# Patient Record
Sex: Female | Born: 1965 | Race: Black or African American | Hispanic: No | Marital: Single | Smoking: Current some day smoker
Health system: Southern US, Community
[De-identification: ages and names within clinical notes are randomized; demographics above are authoritative.]

## PROBLEM LIST (undated history)

## (undated) DIAGNOSIS — I2510 Atherosclerotic heart disease of native coronary artery without angina pectoris: Secondary | ICD-10-CM

## (undated) DIAGNOSIS — E785 Hyperlipidemia, unspecified: Secondary | ICD-10-CM

## (undated) DIAGNOSIS — I10 Essential (primary) hypertension: Secondary | ICD-10-CM

## (undated) DIAGNOSIS — Z91148 Patient's other noncompliance with medication regimen for other reason: Secondary | ICD-10-CM

## (undated) DIAGNOSIS — Z9114 Patient's other noncompliance with medication regimen: Secondary | ICD-10-CM

## (undated) DIAGNOSIS — F1410 Cocaine abuse, uncomplicated: Secondary | ICD-10-CM

## (undated) DIAGNOSIS — I5022 Chronic systolic (congestive) heart failure: Secondary | ICD-10-CM

## (undated) DIAGNOSIS — I639 Cerebral infarction, unspecified: Secondary | ICD-10-CM

## (undated) HISTORY — DX: Cerebral infarction, unspecified: I63.9

---

## 2006-04-25 ENCOUNTER — Emergency Department (HOSPITAL_COMMUNITY): Admission: EM | Admit: 2006-04-25 | Discharge: 2006-04-25 | Payer: Self-pay | Admitting: Emergency Medicine

## 2006-05-15 ENCOUNTER — Emergency Department (HOSPITAL_COMMUNITY): Admission: EM | Admit: 2006-05-15 | Discharge: 2006-05-15 | Payer: Self-pay | Admitting: Family Medicine

## 2006-11-21 ENCOUNTER — Emergency Department (HOSPITAL_COMMUNITY): Admission: EM | Admit: 2006-11-21 | Discharge: 2006-11-21 | Payer: Self-pay | Admitting: Emergency Medicine

## 2007-03-09 ENCOUNTER — Ambulatory Visit: Payer: Self-pay | Admitting: Family Medicine

## 2007-04-13 ENCOUNTER — Ambulatory Visit: Payer: Self-pay | Admitting: Internal Medicine

## 2014-02-16 ENCOUNTER — Inpatient Hospital Stay (HOSPITAL_COMMUNITY)
Admission: EM | Admit: 2014-02-16 | Discharge: 2014-02-18 | DRG: 989 | Disposition: A | Discharge status: Home / Self Care | Payer: Self-pay | Attending: Internal Medicine | Admitting: Internal Medicine

## 2014-02-16 ENCOUNTER — Emergency Department (HOSPITAL_COMMUNITY): Payer: Self-pay

## 2014-02-16 ENCOUNTER — Encounter (HOSPITAL_COMMUNITY): Payer: Self-pay | Admitting: Emergency Medicine

## 2014-02-16 DIAGNOSIS — Z720 Tobacco use: Secondary | ICD-10-CM

## 2014-02-16 DIAGNOSIS — G894 Chronic pain syndrome: Secondary | ICD-10-CM | POA: Diagnosis present

## 2014-02-16 DIAGNOSIS — I10 Essential (primary) hypertension: Secondary | ICD-10-CM | POA: Diagnosis present

## 2014-02-16 DIAGNOSIS — L03115 Cellulitis of right lower limb: Principal | ICD-10-CM | POA: Diagnosis present

## 2014-02-16 DIAGNOSIS — M7751 Other enthesopathy of right foot: Secondary | ICD-10-CM | POA: Diagnosis present

## 2014-02-16 DIAGNOSIS — L089 Local infection of the skin and subcutaneous tissue, unspecified: Secondary | ICD-10-CM

## 2014-02-16 DIAGNOSIS — L97519 Non-pressure chronic ulcer of other part of right foot with unspecified severity: Secondary | ICD-10-CM | POA: Diagnosis present

## 2014-02-16 DIAGNOSIS — B9561 Methicillin susceptible Staphylococcus aureus infection as the cause of diseases classified elsewhere: Secondary | ICD-10-CM | POA: Diagnosis present

## 2014-02-16 HISTORY — DX: Essential (primary) hypertension: I10

## 2014-02-16 LAB — BASIC METABOLIC PANEL
ANION GAP: 7 (ref 5–15)
BUN: 16 mg/dL (ref 6–23)
CALCIUM: 8.9 mg/dL (ref 8.4–10.5)
CO2: 26 mmol/L (ref 19–32)
CREATININE: 0.8 mg/dL (ref 0.50–1.10)
Chloride: 104 mmol/L (ref 96–112)
GFR calc non Af Amer: 85 mL/min — ABNORMAL LOW (ref >90)
GLUCOSE: 105 mg/dL — AB (ref 70–99)
POTASSIUM: 4.1 mmol/L (ref 3.5–5.1)
SODIUM: 137 mmol/L (ref 135–145)

## 2014-02-16 LAB — CBC WITH DIFFERENTIAL/PLATELET
Basophils Absolute: 0 10*3/uL (ref 0.0–0.1)
Basophils Relative: 0 % (ref 0–1)
Eosinophils Absolute: 0.3 10*3/uL (ref 0.0–0.7)
Eosinophils Relative: 4 % (ref 0–5)
HCT: 38.3 % (ref 36.0–46.0)
HEMOGLOBIN: 14 g/dL (ref 12.0–15.0)
Lymphocytes Relative: 53 % — ABNORMAL HIGH (ref 12–46)
Lymphs Abs: 3.5 10*3/uL (ref 0.7–4.0)
MCH: 30.4 pg (ref 26.0–34.0)
MCHC: 36.6 g/dL — AB (ref 30.0–36.0)
MCV: 83.3 fL (ref 78.0–100.0)
MONO ABS: 0.5 10*3/uL (ref 0.1–1.0)
Monocytes Relative: 8 % (ref 3–12)
NEUTROS PCT: 36 % — AB (ref 43–77)
Neutro Abs: 2.4 10*3/uL (ref 1.7–7.7)
Platelets: 208 10*3/uL (ref 150–400)
RBC: 4.6 MIL/uL (ref 3.87–5.11)
RDW: 13.2 % (ref 11.5–15.5)
WBC: 6.7 10*3/uL (ref 4.0–10.5)

## 2014-02-16 LAB — I-STAT CG4 LACTIC ACID, ED: Lactic Acid, Venous: 1 mmol/L (ref 0.5–2.0)

## 2014-02-16 LAB — CBG MONITORING, ED: GLUCOSE-CAPILLARY: 140 mg/dL — AB (ref 70–99)

## 2014-02-16 LAB — SEDIMENTATION RATE: Sed Rate: 24 mm/hr — ABNORMAL HIGH (ref 0–22)

## 2014-02-16 MED ORDER — CLINDAMYCIN PHOSPHATE 600 MG/50ML IV SOLN: 600.0000 mg | Freq: Once | INTRAVENOUS | Status: DC

## 2014-02-16 MED ORDER — OXYCODONE-ACETAMINOPHEN 5-325 MG PO TABS
1.0000 | ORAL_TABLET | Freq: Once | ORAL | Status: AC
  Administered 2014-02-16: 1 via ORAL
  Filled 2014-02-16: qty 1

## 2014-02-16 NOTE — ED Notes (Signed)
CBG was 140.

## 2014-02-16 NOTE — ED Notes (Addendum)
C/o "sore" on R lateral foot x 2-3 months.  Area tender to touch.  Pt states she has been soaking foot without any improvements.

## 2014-02-16 NOTE — ED Provider Notes (Signed)
CSN: [id number]     Arrival date & time 02/16/14  2100 History  This chart was scribed for non-physician practitioner, Jeannett Senior, PA-C working with Dot Lanes, MD by Frederich Balding, ED scribe. This patient was seen in room TR09C/TR09C and the patient's care was started at 9:36 PM.    Chief Complaint  Patient presents with  . Foot Pain   The history is provided by the patient. No language interpreter was used.    HPI Comments: Leslie Walker is a 49 y.o. female who presents to the Emergency Department complaining of a wound to her right foot. States it started as a callus about one year ago but her husband tried to use a razor to cut it open a few months ago. Reports pus drainage and increased pain that started about 2 weeks ago. Pt has done epsom salt soaks with no relief. Denies history of diabetes.   Past Medical History  Diagnosis Date  . Hypertension    History reviewed. No pertinent past surgical history. No family history on file. History  Substance Use Topics  . Smoking status: Current Every Day Smoker  . Smokeless tobacco: Not on file  . Alcohol Use: No   OB History    No data available     Review of Systems  Musculoskeletal: Positive for arthralgias.  All other systems reviewed and are negative.  Allergies  Review of patient's allergies indicates not on file.  Home Medications   Prior to Admission medications   Not on File   BP 148/76 mmHg  Pulse 77  Temp(Src) 97.8 F (36.6 C) (Oral)  Resp 20  Ht 5\' 4"  (1.626 m)  Wt 175 lb (79.379 kg)  BMI 30.02 kg/m2  SpO2 98%  LMP  (LMP Unknown)   Physical Exam  Constitutional: She is oriented to person, place, and time. She appears well-developed and well-nourished. No distress.  HENT:  Head: Normocephalic and atraumatic.  Eyes: Conjunctivae and EOM are normal.  Neck: Neck supple. No tracheal deviation present.  Cardiovascular: Normal rate.   Pulmonary/Chest: Effort normal. No respiratory distress.   Musculoskeletal: Normal range of motion.  3x4cm ulceration to the right lateral foot over 5th metatarsal. TTP. Large purulent drainage upon palpation expressed from wound. Mild tenderness with palpation over dorsal foot. DP pulses intact  Neurological: She is alert and oriented to person, place, and time.  Skin: Skin is warm and dry.  Psychiatric: She has a normal mood and affect. Her behavior is normal.  Nursing note and vitals reviewed.   ED Course  Procedures (including critical care time)  DIAGNOSTIC STUDIES: Oxygen Saturation is 98% on RA, normal by my interpretation.    COORDINATION OF CARE: 9:38 PM-Discussed treatment plan which includes foot xray and blood work with pt at bedside and pt agreed to plan.   Labs Review Labs Reviewed  CBC WITH DIFFERENTIAL/PLATELET - Abnormal; Notable for the following:    MCHC 36.6 (*)    Neutrophils Relative % 36 (*)    Lymphocytes Relative 53 (*)    All other components within normal limits  BASIC METABOLIC PANEL - Abnormal; Notable for the following:    Glucose, Bld 105 (*)    GFR calc non Af Amer 85 (*)    All other components within normal limits  SEDIMENTATION RATE - Abnormal; Notable for the following:    Sed Rate 24 (*)    All other components within normal limits  CBG MONITORING, ED - Abnormal; Notable for the following:  Glucose-Capillary 140 (*)    All other components within normal limits  WOUND CULTURE  I-STAT CG4 LACTIC ACID, ED    Imaging Review Dg Foot Complete Right  02/16/2014   CLINICAL DATA:  Right foot pain, lateral pain for 2-3 months. Tenderness. Initial encounter.  EXAM: RIGHT FOOT COMPLETE - 3+ VIEW  COMPARISON:  None.  FINDINGS: No fracture or dislocation. The alignment and joint spaces are maintained. 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 Mild proliferative spurring in the midfoot. No erosion or periosteal reaction. There is mild soft tissue edema about the lateral  flow with questionable skin defect laterally about the proximal fifth metatarsal. There are no radiopaque foreign bodies.  IMPRESSION: 1. No acute osseous abnormality. 2. Soft tissue edema laterally with questionable skin defect in the region of the proximal fifth metatarsal, may reflect cellulitis/soft tissue infection. No radiographic findings of osteomyelitis.   Electronically Signed   By: Jeb Levering M.D.   On: 02/16/2014 22:28     EKG Interpretation None      MDM   Final diagnoses:  Foot infection     patient with ongoing ulcer to the right foot, has been there for a year. Recurrent abscesses, purulent drainage on and off for the last several months. Presents with increased pain, unable to bear weight on the left, purulent drainage noted from the ulceration to the right lateral foot. We'll get labs, x-ray, cultures obtained and sent. Dr. Audie Pinto has seen patient as well, concerned about recurrence of this infection. Question foreign body.  12:14 AM Spoke with Dr. Doran Durand regarding patient's foot, advised to get MRI of the foot. Also asked not to start antibiotics until deep cultures obtained. Patient's pain treated with Percocet.   Spoke with tried hospitalist, they will admit patient.  Filed Vitals:   02/16/14 2105  BP: 148/76  Pulse: 77  Temp: 97.8 F (36.6 C)  TempSrc: Oral  Resp: 20  Height: 5\' 4"  (1.626 m)  Weight: 175 lb (79.379 kg)  SpO2: 98%     I personally performed the services described in this documentation, which was scribed in my presence. The recorded information has been reviewed and is accurate.  Renold Genta, PA-C 02/17/14 0022  Dot Lanes, MD 02/19/14 [phone]

## 2014-02-17 ENCOUNTER — Inpatient Hospital Stay (HOSPITAL_COMMUNITY): Payer: MEDICAID

## 2014-02-17 DIAGNOSIS — L97509 Non-pressure chronic ulcer of other part of unspecified foot with unspecified severity: Secondary | ICD-10-CM | POA: Insufficient documentation

## 2014-02-17 DIAGNOSIS — G894 Chronic pain syndrome: Secondary | ICD-10-CM | POA: Diagnosis present

## 2014-02-17 DIAGNOSIS — L03115 Cellulitis of right lower limb: Principal | ICD-10-CM

## 2014-02-17 DIAGNOSIS — I10 Essential (primary) hypertension: Secondary | ICD-10-CM | POA: Diagnosis present

## 2014-02-17 LAB — CBC
HCT: 36.7 % (ref 36.0–46.0)
Hemoglobin: 13.3 g/dL (ref 12.0–15.0)
MCH: 30.1 pg (ref 26.0–34.0)
MCHC: 36.2 g/dL — AB (ref 30.0–36.0)
MCV: 83 fL (ref 78.0–100.0)
PLATELETS: 194 10*3/uL (ref 150–400)
RBC: 4.42 MIL/uL (ref 3.87–5.11)
RDW: 13.2 % (ref 11.5–15.5)
WBC: 4.4 10*3/uL (ref 4.0–10.5)

## 2014-02-17 LAB — COMPREHENSIVE METABOLIC PANEL
ALBUMIN: 3.3 g/dL — AB (ref 3.5–5.2)
ALT: 12 U/L (ref 0–35)
ANION GAP: 10 (ref 5–15)
AST: 20 U/L (ref 0–37)
Alkaline Phosphatase: 75 U/L (ref 39–117)
BILIRUBIN TOTAL: 0.4 mg/dL (ref 0.3–1.2)
BUN: 14 mg/dL (ref 6–23)
CALCIUM: 9 mg/dL (ref 8.4–10.5)
CO2: 24 mmol/L (ref 19–32)
CREATININE: 0.75 mg/dL (ref 0.50–1.10)
Chloride: 106 mmol/L (ref 96–112)
GFR calc Af Amer: >90 mL/min (ref >90)
GFR calc non Af Amer: >90 mL/min (ref >90)
Glucose, Bld: 111 mg/dL — ABNORMAL HIGH (ref 70–99)
POTASSIUM: 4.1 mmol/L (ref 3.5–5.1)
SODIUM: 140 mmol/L (ref 135–145)
Total Protein: 6.8 g/dL (ref 6.0–8.3)

## 2014-02-17 LAB — PROTIME-INR
INR: 0.98 (ref 0.00–1.49)
PROTHROMBIN TIME: 13.1 s (ref 11.6–15.2)

## 2014-02-17 LAB — C-REACTIVE PROTEIN

## 2014-02-17 MED ORDER — ONDANSETRON HCL 4 MG PO TABS: 4.0000 mg | ORAL_TABLET | Freq: Four times a day (QID) | ORAL | Status: DC | PRN

## 2014-02-17 MED ORDER — TETANUS-DIPHTH-ACELL PERTUSSIS 5-2.5-18.5 LF-MCG/0.5 IM SUSP
0.5000 mL | Freq: Once | INTRAMUSCULAR | Status: AC
  Administered 2014-02-17: 0.5 mL via INTRAMUSCULAR
  Filled 2014-02-17: qty 0.5

## 2014-02-17 MED ORDER — OXYCODONE-ACETAMINOPHEN 5-325 MG PO TABS
1.0000 | ORAL_TABLET | ORAL | Status: AC | PRN
  Administered 2014-02-17 (×2): 1 via ORAL
  Filled 2014-02-17 (×2): qty 1

## 2014-02-17 MED ORDER — ACETAMINOPHEN 325 MG PO TABS: 650.0000 mg | ORAL_TABLET | Freq: Four times a day (QID) | ORAL | Status: DC | PRN

## 2014-02-17 MED ORDER — LISINOPRIL 10 MG PO TABS
10.0000 mg | ORAL_TABLET | Freq: Every day | ORAL | Status: DC
  Administered 2014-02-18: 10 mg via ORAL
  Filled 2014-02-17 (×3): qty 1

## 2014-02-17 MED ORDER — METHADONE HCL 5 MG PO TABS
36.0000 mg | ORAL_TABLET | Freq: Every day | ORAL | Status: DC
  Administered 2014-02-17 – 2014-02-18 (×2): 35 mg via ORAL
  Filled 2014-02-17 (×2): qty 7

## 2014-02-17 MED ORDER — ONDANSETRON HCL 4 MG/2ML IJ SOLN
4.0000 mg | Freq: Four times a day (QID) | INTRAMUSCULAR | Status: DC | PRN
  Administered 2014-02-17: 4 mg via INTRAVENOUS
  Filled 2014-02-17: qty 2

## 2014-02-17 MED ORDER — ENOXAPARIN SODIUM 40 MG/0.4ML ~~LOC~~ SOLN
40.0000 mg | SUBCUTANEOUS | Status: DC
Start: 2014-02-17 — End: 2014-02-18
  Administered 2014-02-17: 40 mg via SUBCUTANEOUS
  Filled 2014-02-17 (×3): qty 0.4

## 2014-02-17 MED ORDER — ACETAMINOPHEN 650 MG RE SUPP: 650.0000 mg | Freq: Four times a day (QID) | RECTAL | Status: DC | PRN

## 2014-02-17 MED ORDER — GADOBENATE DIMEGLUMINE 529 MG/ML IV SOLN
10.0000 mL | Freq: Once | INTRAVENOUS | Status: AC | PRN
  Administered 2014-02-17: 10 mL via INTRAVENOUS

## 2014-02-17 MED ORDER — METHADONE HCL 5 MG/5ML PO SOLN: 36.0000 mg | Freq: Every day | ORAL | Status: DC

## 2014-02-17 MED ORDER — ONDANSETRON HCL 4 MG/2ML IJ SOLN: 4.0000 mg | Freq: Three times a day (TID) | INTRAMUSCULAR | Status: DC | PRN

## 2014-02-17 NOTE — H&P (Signed)
[facility] History and Physical  Patient: Leslie Walker  MRN: [id number]  DOB: Nov 08, 1965  DOS: the patient was seen and examined on 02/17/2014 PCP: No primary care provider on file.  Chief Complaint: Right foot discharge  HPI: Leslie Walker is a 49 y.o. female with Past medical history of hypertension and chronic pain syndrome. The patient is presenting with complaints of discharge from her right foot. She mentions that she has developed a callus 1 year ago. The callus was progressing in few months ago her husband removed a portion of it by cutting. They have been cutting the lesion for few times over last few months. The patient has also developed pain and has been limping while walking. Leslie Walker has progressively worsened over last 1 month. Today the patient was mentioning that the pain was more severe. Patient soak her leg in Epsom salt. After that there was more swelling on the foot. Later on when the patient was walking there was pus coming out from the lesion and therefore they brought her here. Patient complains of pain at the time of my evaluation at the ankle joint and dorsum of the foot. Patient also complains of pain on movement of the toe and the foot. Patient denies any fever chills nausea vomiting chest pain shortness of breath cough diarrhea burning urination. Patient denies using any antibiotics. Patient denies any injury that may have started this lesion. Patient does not remember her last tetanus injection.   The patient is coming from home. And at her baseline independent for most of her ADL.  Review of Systems: as mentioned in the history of present illness.  A Comprehensive review of the other systems is negative.  Past Medical History  Diagnosis Date  . Hypertension    History reviewed. No pertinent past surgical history. Social History:  reports that she has been smoking.  She does not have any smokeless tobacco history on file. She reports that she does not  drink alcohol or use illicit drugs.  No Known Allergies  No family history on file.  Prior to Admission medications   Medication Sig Start Date End Date Taking? Authorizing Provider  lisinopril (PRINIVIL,ZESTRIL) 10 MG tablet Take 10 mg by mouth daily.   Yes Historical Provider, MD  methadone (DOLOPHINE) 10 MG/ML solution Take 36 mg by mouth daily.   Yes Historical Provider, MD    Physical Exam: Filed Vitals:   02/16/14 2105 02/17/14 0035 02/17/14 0129 02/17/14 0500  BP: 148/76 123/81 130/77 113/60  Pulse: 77 69 65 69  Temp: 97.8 F (36.6 C) 97.7 F (36.5 C) 97.6 F (36.4 C) 98.5 F (36.9 C)  TempSrc: Oral  Oral Oral  Resp: '20 20 20 20  ' Height: '5\' 4"'  (1.626 m)  '5\' 4"'  (1.626 m)   Weight: 79.379 kg (175 lb)  78.019 kg (172 lb)   SpO2: 98% 100% 100% 100%    General: Alert, Awake and Oriented to Time, Place and Person. Appear in mild distress Eyes: PERRL ENT: Oral Mucosa clear moist Neck: no JVD Cardiovascular: S1 and S2 Present, no Murmur, Peripheral Pulses Present Respiratory: Bilateral Air entry equal and Decreased, noClear to Auscultation, noCrackles, no wheezes Abdomen: Bowel Sound present, Soft and no tender Skin: no Rash Extremities: no Pedal edema, no calf tenderness Tenderness in the dorsum of the foot and lateral aspect. General white discoloration on the lateral aspect of the foot. Labs on the foot is warm and tender. Mild edema on the foot. Neurologic: Grossly no focal neuro deficit.  Labs on Admission:  CBC:  Recent Labs Lab 02/16/14 2209  WBC 6.7  NEUTROABS 2.4  HGB 14.0  HCT 38.3  MCV 83.3  PLT 208    CMP     Component Value Date/Time   NA 137 02/16/2014 2209   K 4.1 02/16/2014 2209   CL 104 02/16/2014 2209   CO2 26 02/16/2014 2209   GLUCOSE 105* 02/16/2014 2209   BUN 16 02/16/2014 2209   CREATININE 0.80 02/16/2014 2209   CALCIUM 8.9 02/16/2014 2209   GFRNONAA 85* 02/16/2014 2209   GFRAA >90 02/16/2014 2209    No results for input(s):  LIPASE, AMYLASE in the last 168 hours.  No results for input(s): CKTOTAL, CKMB, CKMBINDEX, TROPONINI in the last 168 hours. BNP (last 3 results) No results for input(s): PROBNP in the last 8760 hours.  Radiological Exams on Admission: Dg Foot Complete Right  02/16/2014   CLINICAL DATA:  Right foot pain, lateral pain for 2-3 months. Tenderness. Initial encounter.  EXAM: RIGHT FOOT COMPLETE - 3+ VIEW  COMPARISON:  None.  FINDINGS: No fracture or dislocation. The alignment and joint spaces are maintained. 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 Mild proliferative spurring in the midfoot. No erosion or periosteal reaction. There is mild soft tissue edema about the lateral flow with questionable skin defect laterally about the proximal fifth metatarsal. There are no radiopaque foreign bodies.  IMPRESSION: 1. No acute osseous abnormality. 2. Soft tissue edema laterally with questionable skin defect in the region of the proximal fifth metatarsal, may reflect cellulitis/soft tissue infection. No radiographic findings of osteomyelitis.   Electronically Signed   By: Jeb Levering M.D.   On: 02/16/2014 22:28    Assessment/Plan Principal Problem:   Cellulitis of foot, right Active Problems:   Chronic pain syndrome   Essential hypertension   1. Cellulitis of foot, right The patient is presenting with complaints of pain and antalgic gait. Also noted some pus and an ulcer on the lateral aspect of the right foot. The ulcer has been present for last 1 month but progressively worsening. At present the foot x-ray shows soft tissue swelling. Case was discussed with orthopedic would recommend an MRI of the foot and will be following up with the patient. MRA of the foot is already ordered. Follow cultures ESR CRP. At present holding antibiotic as per discussion with orthopedics.  2. Chronic pain syndrome. Continuing methadone at home doses. The medication has been  verified by the pharmacy.  3. Essential hypertension. Continue home medication.  Advance goals of care discussion: Full codeConOrthopedics  DVT Prophylaxis: subcutaneous Heparin Nutrition: Regular diet  Family Communication: husband was present at bedside, opportunity was given to ask question and all questions were answered satisfactorily at the time of interview. Disposition: Admitted to inpatient in med-surge unit.  Author: Berle Mull, MD [facility] Pager: [phone] 02/17/2014, 5:18 AM    If 7PM-7AM, please contact night-coverage [email or web address] Password [email or web address]

## 2014-02-17 NOTE — Progress Notes (Signed)
Utilization review completed. Shaday Rayborn, RN, BSN. 

## 2014-02-17 NOTE — Consult Note (Addendum)
Reason for Consult:  right foot pain and drainage Referring Physician: Dr. Irine Seal Leslie Walker is an 49 y.o. female.  HPI:  49 y/o female with PMH of htn c/o pain from callous on her right foot for the last year.  She has had it trimmed by her husband and has experienced drainage periodically.  She denies f/c/n/v.  No injury or surgery to the foot.  The pt is a very poor historian.  No h/o diabetes or peripheral neuropathy.  She says it hurts to walk on it and feels better with rest and elevation.  Past Medical History  Diagnosis Date  . Hypertension     History reviewed. No pertinent past surgical history.  No family history on file.  Social History:  reports that she has been smoking.  She does not have any smokeless tobacco history on file. She reports that she does not drink alcohol or use illicit drugs.  Works in custodial services  Allergies: No Known Allergies  Medications: I have reviewed the patient's current medications.  Results for orders placed or performed during the hospital encounter of 02/16/14 (from the past 48 hour(s))  POC CBG, ED     Status: Abnormal   Collection Time: 02/16/14  9:31 PM  Result Value Ref Range   Glucose-Capillary 140 (H) 70 - 99 mg/dL  CBC with Differential     Status: Abnormal   Collection Time: 02/16/14 10:09 PM  Result Value Ref Range   WBC 6.7 4.0 - 10.5 K/uL   RBC 4.60 3.87 - 5.11 MIL/uL   Hemoglobin 14.0 12.0 - 15.0 g/dL   HCT 38.3 36.0 - 46.0 %   MCV 83.3 78.0 - 100.0 fL   MCH 30.4 26.0 - 34.0 pg   MCHC 36.6 (H) 30.0 - 36.0 g/dL   RDW 13.2 11.5 - 15.5 %   Platelets 208 150 - 400 K/uL   Neutrophils Relative % 36 (L) 43 - 77 %   Neutro Abs 2.4 1.7 - 7.7 K/uL   Lymphocytes Relative 53 (H) 12 - 46 %   Lymphs Abs 3.5 0.7 - 4.0 K/uL   Monocytes Relative 8 3 - 12 %   Monocytes Absolute 0.5 0.1 - 1.0 K/uL   Eosinophils Relative 4 0 - 5 %   Eosinophils Absolute 0.3 0.0 - 0.7 K/uL   Basophils Relative 0 0 - 1 %   Basophils Absolute  0.0 0.0 - 0.1 K/uL  Basic metabolic panel     Status: Abnormal   Collection Time: 02/16/14 10:09 PM  Result Value Ref Range   Sodium 137 135 - 145 mmol/L   Potassium 4.1 3.5 - 5.1 mmol/L   Chloride 104 96 - 112 mmol/L   CO2 26 19 - 32 mmol/L   Glucose, Bld 105 (H) 70 - 99 mg/dL   BUN 16 6 - 23 mg/dL   Creatinine, Ser 0.80 0.50 - 1.10 mg/dL   Calcium 8.9 8.4 - 10.5 mg/dL   GFR calc non Af Amer 85 (L) >90 mL/min   GFR calc Af Amer >90 >90 mL/min    Comment: (NOTE) The eGFR has been calculated using the CKD EPI equation. This calculation has not been validated in all clinical situations. eGFR's persistently <90 mL/min signify possible Chronic Kidney Disease.    Anion gap 7 5 - 15  Sedimentation rate     Status: Abnormal   Collection Time: 02/16/14 10:09 PM  Result Value Ref Range   Sed Rate 24 (H) 0 - 22  mm/hr  I-Stat CG4 Lactic Acid, ED     Status: None   Collection Time: 02/16/14 10:16 PM  Result Value Ref Range   Lactic Acid, Venous 1.00 0.5 - 2.0 mmol/L    Dg Foot Complete Right  02/16/2014   CLINICAL DATA:  Right foot pain, lateral pain for 2-3 months. Tenderness. Initial encounter.  EXAM: RIGHT FOOT COMPLETE - 3+ VIEW  COMPARISON:  None.  FINDINGS: No fracture or dislocation. The alignment and joint spaces are maintained. 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 Mild proliferative spurring in the midfoot. No erosion or periosteal reaction. There is mild soft tissue edema about the lateral flow with questionable skin defect laterally about the proximal fifth metatarsal. There are no radiopaque foreign bodies.  IMPRESSION: 1. No acute osseous abnormality. 2. Soft tissue edema laterally with questionable skin defect in the region of the proximal fifth metatarsal, may reflect cellulitis/soft tissue infection. No radiographic findings of osteomyelitis.   Electronically Signed   By: Jeb Levering M.D.   On: 02/16/2014 22:28    ROS:  No  n./v/f/c.  + sweats (she relates to menopause) PE:  Blood pressure 113/60, pulse 69, temperature 98.5 F (36.9 C), temperature source Oral, resp. rate 20, height _0  (1.626 m), weight 78.019 kg (172 lb), SpO2 100 %. wn wd woman in nad.  A and O x 4.  Mood and affect normal.  EOMi.  Res punalbored.  R foot with 2 mm ulcer laterally at 5th MT base.  No lymphadenopathy.  Skin o/w heatlhy adn intact.  5/5 strength in PF and DF of the ankle and toes.  Snes to LT intact at the forefoot.  TTP at 5th MT base.  No fluctuance.  serosang drainage.  No purulence.  No cellulitis.  Assessment/Plan: R foot ulcer and abscess - rec MRI of right foot to eval for deep abscess.  Based on results either OR or bedside I and D with packing.  Pt will likely need short course of abx post op.  Hold abx for now to allow for deep cultures in OR.    Leslie Walker 02/17/2014, 7:48 AM

## 2014-02-17 NOTE — Progress Notes (Signed)
Dr. Patel at bedside 

## 2014-02-17 NOTE — Progress Notes (Signed)
She'll admitted few hours ago for right foot cellulitis/abscess, orthopedics following. Await MRI. Stable vital signs patient stable. Continue present care.

## 2014-02-18 ENCOUNTER — Encounter (HOSPITAL_COMMUNITY)
Admission: EM | Disposition: A | Discharge status: Home / Self Care | Payer: Self-pay | Source: Home / Self Care | Attending: Internal Medicine

## 2014-02-18 SURGERY — IRRIGATION AND DEBRIDEMENT EXTREMITY
Anesthesia: General | Laterality: Right

## 2014-02-18 MED ORDER — HYDROCODONE-ACETAMINOPHEN 5-325 MG PO TABS: 1.0000 | ORAL_TABLET | Freq: Four times a day (QID) | ORAL | Status: DC | PRN

## 2014-02-18 MED ORDER — SULFAMETHOXAZOLE-TRIMETHOPRIM 400-80 MG PO TABS
1.0000 | ORAL_TABLET | Freq: Two times a day (BID) | ORAL | Status: DC
Start: 2014-02-18 — End: 2018-06-26

## 2014-02-18 NOTE — Progress Notes (Signed)
Subjective: Pt says right foot is still sore.  Denies f/c/n/v.  Enjoying a biscuit upon my arrival. No abx on this admission so far.  Objective: Vital signs in last 24 hours: Temp:  [98.2 F (36.8 C)-98.7 F (37.1 C)] 98.6 F (37 C) (02/02 0618) Pulse Rate:  [67-70] 68 (02/02 0618) Resp:  [20] 20 (02/01 1254) BP: (92-140)/(45-67) 140/67 mmHg (02/02 0618) SpO2:  [98 %-100 %] 100 % (02/02 0618) Weight:  [73.301 kg (161 lb 9.6 oz)] 73.301 kg (161 lb 9.6 oz) (02/02 0540)  Intake/Output from previous day: 02/01 0701 - 02/02 0700 In: 240 [P.O.:240] Out: -  Intake/Output this shift:     Recent Labs  02/16/14 2209 02/17/14 0930  HGB 14.0 13.3    Recent Labs  02/16/14 2209 02/17/14 0930  WBC 6.7 4.4  RBC 4.60 4.42  HCT 38.3 36.7  PLT 208 194    Recent Labs  02/16/14 2209 02/17/14 0930  NA 137 140  K 4.1 4.1  CL 104 106  CO2 26 24  BUN 16 14  CREATININE 0.80 0.75  GLUCOSE 105* 111*  CALCIUM 8.9 9.0    Recent Labs  02/17/14 0930  INR 0.98    PE:  wn wd woman in nad.  R foot with calloused area about the 5th MT.  No cellulitis.  Sens to LT intact.  TTp at the calloused area.  MRI done late yesterday showed no abscess.  Only bursa at the 5th MT base.  Assessment/Plan: R foot 5th MT base ulcer and bursitis - today I explained the nature of this bursitis and ulcer to the patient and her husband.  I offered to pare down the callous to help relieve the pressure on this area and taught her husband how to make a pressure relieving orthotic.  Procedure:  After informed consent, I pared down the calloused area with a #10 scalpel.  This uncovered two small areas of ulceration with healthy granulation tissue.  No purulence noted.  No fluctuance or drainage.  She tolerated this procedure well with no evident complications.  I believe she can be discharged home safely.  I'll see her back in the office in a week for a wound check.  D/w with Dr. Candiss Norse.   Wylene Simmer 02/18/2014, 8:44 AM

## 2014-02-18 NOTE — Discharge Instructions (Addendum)
Keep your R.Foot clean and dry at all times  Follow with Primary MD and Dr Doran Durand in 7 days   Get CBC, CMP, 2 view Chest X ray checked  by Primary MD next visit.    Activity: As tolerated with Full fall precautions use walker/cane & assistance as needed   Disposition Home    Diet: Heart Healthy   For Heart failure patients - Check your Weight same time everyday, if you gain over 2 pounds, or you develop in leg swelling, experience more shortness of breath or chest pain, call your Primary MD immediately. Follow Cardiac Low Salt Diet and 1.8 lit/day fluid restriction.   On your next visit with your primary care physician please Get Medicines reviewed and adjusted.   Please request your Prim.MD to go over all Hospital Tests and Procedure/Radiological results at the follow up, please get all Hospital records sent to your Prim MD by signing hospital release before you go home.   If you experience worsening of your admission symptoms, develop shortness of breath, life threatening emergency, suicidal or homicidal thoughts you must seek medical attention immediately by calling 911 or calling your MD immediately  if symptoms less severe.  You Must read complete instructions/literature along with all the possible adverse reactions/side effects for all the Medicines you take and that have been prescribed to you. Take any new Medicines after you have completely understood and accpet all the possible adverse reactions/side effects.   Do not drive, operating heavy machinery, perform activities at heights, swimming or participation in water activities or provide baby sitting services if your were admitted for syncope or siezures until you have seen by Primary MD or a Neurologist and advised to do so again.  Do not drive when taking Pain medications.    Do not take more than prescribed Pain, Sleep and Anxiety Medications  Special Instructions: If you have smoked or chewed Tobacco  in the last 2 yrs  please stop smoking, stop any regular Alcohol  and or any Recreational drug use.  Wear Seat belts while driving.   Please note  You were cared for by a hospitalist during your hospital stay. If you have any questions about your discharge medications or the care you received while you were in the hospital after you are discharged, you can call the unit and asked to speak with the hospitalist on call if the hospitalist that took care of you is not available. Once you are discharged, your primary care physician will handle any further medical issues. Please note that NO REFILLS for any discharge medications will be authorized once you are discharged, as it is imperative that you return to your primary care physician (or establish a relationship with a primary care physician if you do not have one) for your aftercare needs so that they can reassess your need for medications and monitor your lab values.                                                       Ellieana Dolecki was admitted to the Hospital on 02/16/2014 and Discharged  02/18/2014 and should be excused from work/school   for 5  days starting 02/16/2014 , may return to work/school without any restrictions.  Call Lala Lund MD, [facility] [phone] with questions.  Thurnell Lose M.D on 02/18/2014,at  8:00 AM  [facility]   Office  [place], [place]  Please read the following information regarding your care after surgery.  Medications   Take the antibiotic as prescribed for a week.   Soak your foot daily in Epsom salt and warm water for 15 min.  Weight Bearing Bear weight only on the heel of your operated foot in the post-op shoe.  Cast / Splint / Dressing Change your dressing daily with gauze and the ace bandage.  Swelling It is normal for you to have swelling where you had surgery.  To reduce swelling and pain, keep your toes above your nose for at least 3 days after  surgery.  It may be necessary to keep your foot or leg elevated for several weeks.  If it hurts, it should be elevated.  Follow Up Call my office at [phone] when you are discharged from the hospital or surgery center to schedule an appointment to be seen two weeks after surgery.  Call my office at [phone] if you develop a fever >101.5 F, nausea, vomiting, bleeding from the surgical site or severe pain.

## 2014-02-18 NOTE — Progress Notes (Signed)
Orthopedic Tech Progress Note Patient Details:  Leslie Walker 05-05-1965 [id number]  Ortho Devices Type of Ortho Device: Postop shoe/boot Ortho Device/Splint Location: rle Ortho Device/Splint Interventions: Application As ordered by Dr. Joesph Fillers, Leslie Walker 02/18/2014, 9:00 AM

## 2014-02-18 NOTE — Discharge Summary (Signed)
Leslie Walker, is a 49 y.o. female  DOB Nov 02, 1965  MRN [id number].  Admission date:  02/16/2014  Admitting Physician  Berle Mull, MD  Discharge Date:  02/18/2014   Primary MD  No primary care provider on file.  Recommendations for primary care physician for things to follow:   Check CBC, BMP in a week. Monitor right Calus wound infection.   Admission Diagnosis  Foot infection [L08.9]   Discharge Diagnosis  Foot infection [L08.9]    Principal Problem:   Cellulitis of foot, right Active Problems:   Chronic pain syndrome   Essential hypertension      Past Medical History  Diagnosis Date  . Hypertension     History reviewed. No pertinent past surgical history.     History of present illness and  Hospital Course:     Kindly see H&P for history of present illness and admission details, please review complete Labs, Consult reports and Test reports for all details in brief  HPI  from the history and physical done on the day of admission  Leslie Walker is a 49 y.o. female with Past medical history of hypertension and chronic pain syndrome. The patient is presenting with complaints of discharge from her right foot. She mentions that she has developed a callus 1 year ago. The callus was progressing in few months ago her husband removed a portion of it by cutting. They have been cutting the lesion for few times over last few months. The patient has also developed pain and has been limping while walking. Leslie Walker has progressively worsened over last 1 month. Today the patient was mentioning that the pain was more severe. Patient soak her leg in Epsom salt. After that there was more swelling on the foot. Later on when the patient was walking there was pus coming out from the lesion and therefore they brought her here. Patient  complains of pain at the time of my evaluation at the ankle joint and dorsum of the foot. Patient also complains of pain on movement of the toe and the foot. Patient denies any fever chills nausea vomiting chest pain shortness of breath cough diarrhea burning urination. Patient denies using any antibiotics. Patient denies any injury that may have started this lesion. Patient does not remember her last tetanus injection.  Hospital Course    1. Right foot lateral aspect callus with mild cellulitis. MRI stable without any abscess or osteomyelitis, seen by orthopedic physician Dr. Doran Durand, underwent bedside debridement. Per Dr. Doran Durand stable for home discharge on 1 week of oral Bactrim with outpatient follow-up with him in the office.   2. Essential hypertension and chronic pain. Home medications continued.     Discharge Condition: Stable   Follow UP  Follow-up Information    Follow up with [facility]    . Schedule an appointment as soon as possible for a visit in 1 week.   Contact information:   [address] [place] [address] [phone]  Follow up with HEWITT, Jenny Reichmann, MD. Schedule an appointment as soon as possible for a visit in 1 week.   Specialty:  Orthopedic Surgery   Contact information:   [address] [place] [address] [phone]         Discharge Instructions  and  Discharge Medications      Discharge Instructions    Diet - low sodium heart healthy    Complete by:  As directed      Discharge instructions    Complete by:  As directed   Keep your R.Foot clean and dry at all times  Follow with Primary MD and Dr Doran Durand in 7 days   Get CBC, CMP, 2 view Chest X ray checked  by Primary MD next visit.    Activity: As tolerated with Full fall precautions use walker/cane & assistance as needed   Disposition Home    Diet: Heart Healthy   For Heart failure patients - Check your  Weight same time everyday, if you gain over 2 pounds, or you develop in leg swelling, experience more shortness of breath or chest pain, call your Primary MD immediately. Follow Cardiac Low Salt Diet and 1.8 lit/day fluid restriction.   On your next visit with your primary care physician please Get Medicines reviewed and adjusted.   Please request your Prim.MD to go over all Hospital Tests and Procedure/Radiological results at the follow up, please get all Hospital records sent to your Prim MD by signing hospital release before you go home.   If you experience worsening of your admission symptoms, develop shortness of breath, life threatening emergency, suicidal or homicidal thoughts you must seek medical attention immediately by calling 911 or calling your MD immediately  if symptoms less severe.  You Must read complete instructions/literature along with all the possible adverse reactions/side effects for all the Medicines you take and that have been prescribed to you. Take any new Medicines after you have completely understood and accpet all the possible adverse reactions/side effects.   Do not drive, operating heavy machinery, perform activities at heights, swimming or participation in water activities or provide baby sitting services if your were admitted for syncope or siezures until you have seen by Primary MD or a Neurologist and advised to do so again.  Do not drive when taking Pain medications.    Do not take more than prescribed Pain, Sleep and Anxiety Medications  Special Instructions: If you have smoked or chewed Tobacco  in the last 2 yrs please stop smoking, stop any regular Alcohol  and or any Recreational drug use.  Wear Seat belts while driving.   Please note  You were cared for by a hospitalist during your hospital stay. If you have any questions about your discharge medications or the care you received while you were in the hospital after you are discharged, you can call  the unit and asked to speak with the hospitalist on call if the hospitalist that took care of you is not available. Once you are discharged, your primary care physician will handle any further medical issues. Please note that NO REFILLS for any discharge medications will be authorized once you are discharged, as it is imperative that you return to your primary care physician (or establish a relationship with a primary care physician if you do not have one) for your aftercare needs so that they can reassess your need for medications and monitor your lab values.     Increase activity slowly  Complete by:  As directed             Medication List    TAKE these medications        HYDROcodone-acetaminophen 5-325 MG per tablet  Commonly known as:  NORCO/VICODIN  Take 1 tablet by mouth every 6 (six) hours as needed for moderate pain.     lisinopril 10 MG tablet  Commonly known as:  PRINIVIL,ZESTRIL  Take 10 mg by mouth daily.     methadone 10 MG/ML solution  Commonly known as:  DOLOPHINE  Take 36 mg by mouth daily.     sulfamethoxazole-trimethoprim 400-80 MG per tablet  Commonly known as:  BACTRIM  Take 1 tablet by mouth 2 (two) times daily.          Diet and Activity recommendation: See Discharge Instructions above   Consults obtained - Ortho [facility]   Major procedures and Radiology Reports - PLEASE review detailed and final reports for all details, in brief -       Mr Foot Right W Wo Contrast  02/17/2014   CLINICAL DATA:  Foot infection.  Lateral foot pain.  Tenderness.  EXAM: MRI OF THE RIGHT FOREFOOT WITHOUT AND WITH CONTRAST  TECHNIQUE: Multiplanar, multisequence MR imaging was performed both before and after administration of intravenous contrast.  CONTRAST:  67mL MULTIHANCE GADOBENATE DIMEGLUMINE 529 MG/ML IV SOLN  COMPARISON:  Radiographs dated 02/16/2014  FINDINGS: There is inflammation with edema and abnormal enhancement in the subcutaneous fat adjacent to the inferior  lateral aspect of the base of the fifth metatarsal and adjacent to the abductor digiti minimi muscle. There is an area of focal abnormal decreased signal intensity in the subcutaneous tissues at the site as well as a tubular area of increased signal intensity on T2 and postcontrast imaging measuring approximately 3 x 6 x 16 mm. This consistent with a chronic area of inflammation but there is no discrete fluid collection. The soft tissue abnormality is consistent with focal chronic adventitial bursitis but there is no discrete fluid in this area at this time. There is a tiny in the skin overlying this area.  There is no osteomyelitis or other acute osseous abnormality. There is slight degenerative changes of the first metatarsal phalangeal joint. There is a degenerative intraosseous ganglion cyst at the middle facet of the talus.  IMPRESSION: 1. Area of chronic inflammation of the subcutaneous soft tissues at the lateral aspect of the base of the fifth metatarsal. There is adjacent abnormal low signal in the subcutaneous fat, probably representing chronic adventitial bursitis. No definable abscess. 2. Nonspecific subcutaneous edema on the dorsum of the foot.   Electronically Signed   By: Rozetta Nunnery M.D.   On: 02/17/2014 15:03   Dg Foot Complete Right  02/16/2014   CLINICAL DATA:  Right foot pain, lateral pain for 2-3 months. Tenderness. Initial encounter.  EXAM: RIGHT FOOT COMPLETE - 3+ VIEW  COMPARISON:  None.  FINDINGS: No fracture or dislocation. The alignment and joint spaces are maintained. 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 Mild proliferative spurring in the midfoot. No erosion or periosteal reaction. There is mild soft tissue edema about the lateral flow with questionable skin defect laterally about the proximal fifth metatarsal. There are no radiopaque foreign bodies.  IMPRESSION: 1. No acute osseous abnormality. 2. Soft tissue edema laterally with questionable  skin defect in the region of the proximal fifth metatarsal, may reflect cellulitis/soft tissue infection. No radiographic findings of osteomyelitis.   Electronically  Signed   By: Jeb Levering M.D.   On: 02/16/2014 22:28    Micro Results      Recent Results (from the past 240 hour(s))  Wound culture     Status: None (Preliminary result)   Collection Time: 02/16/14  9:52 PM  Result Value Ref Range Status   Specimen Description WOUND RIGHT FOOT  Final   Special Requests NONE  Final   Gram Stain   Final    FEW WBC PRESENT, PREDOMINANTLY MONONUCLEAR NO SQUAMOUS EPITHELIAL CELLS SEEN FEW GRAM POSITIVE COCCI IN PAIRS IN CLUSTERS Performed at [facility]    Culture   Final    MODERATE STAPHYLOCOCCUS AUREUS Note: RIFAMPIN AND GENTAMICIN SHOULD NOT BE USED AS SINGLE DRUGS FOR TREATMENT OF STAPH INFECTIONS. Performed at [facility]    Report Status PENDING  Incomplete       Today   Subjective:   Leslie Walker today has no headache,no chest abdominal pain,no new weakness tingling or numbness, feels much better wants to go home today.    Objective:   Blood pressure 140/67, pulse 68, temperature 98.6 F (37 C), temperature source Oral, resp. rate 20, height 5\' 4"  (1.626 m), weight 73.301 kg (161 lb 9.6 oz), SpO2 100 %.   Intake/Output Summary (Last 24 hours) at 02/18/14 1043 Last data filed at 02/18/14 0800  Gross per 24 hour  Intake    480 ml  Output      0 ml  Net    480 ml    Exam Awake Alert, Oriented x 3, No new F.N deficits, Normal affect [place].AT,PERRAL Supple Neck,No JVD, No cervical lymphadenopathy appriciated.  Symmetrical Chest wall movement, Good air movement bilaterally, CTAB RRR,No Gallops,Rubs or new Murmurs, No Parasternal Heave +ve B.Sounds, Abd Soft, Non tender, No organomegaly appriciated, No rebound -guarding or rigidity. No Cyanosis, Clubbing or edema, No new Rash or bruise, right foot lateral aspect small callus. Minimal  cellulitis  Data Review   CBC w Diff: Lab Results  Component Value Date   WBC 4.4 02/17/2014   HGB 13.3 02/17/2014   HCT 36.7 02/17/2014   PLT 194 02/17/2014   LYMPHOPCT 53* 02/16/2014   MONOPCT 8 02/16/2014   EOSPCT 4 02/16/2014   BASOPCT 0 02/16/2014    CMP: Lab Results  Component Value Date   NA 140 02/17/2014   K 4.1 02/17/2014   CL 106 02/17/2014   CO2 24 02/17/2014   BUN 14 02/17/2014   CREATININE 0.75 02/17/2014   PROT 6.8 02/17/2014   ALBUMIN 3.3* 02/17/2014   BILITOT 0.4 02/17/2014   ALKPHOS 75 02/17/2014   AST 20 02/17/2014   ALT 12 02/17/2014  .   Total Time in preparing paper work, data evaluation and todays exam - 35 minutes  Thurnell Lose M.D on 02/18/2014 at 10:44 AM  [facility] Office  [phone]

## 2014-02-19 LAB — WOUND CULTURE

## 2014-09-30 ENCOUNTER — Other Ambulatory Visit: Payer: Self-pay | Admitting: Obstetrics and Gynecology

## 2014-09-30 DIAGNOSIS — Z1231 Encounter for screening mammogram for malignant neoplasm of breast: Secondary | ICD-10-CM

## 2014-10-10 ENCOUNTER — Encounter (HOSPITAL_COMMUNITY): Payer: Self-pay

## 2014-10-10 ENCOUNTER — Ambulatory Visit (HOSPITAL_COMMUNITY)
Admission: RE | Admit: 2014-10-10 | Discharge: 2014-10-10 | Disposition: A | Discharge status: Home / Self Care | Payer: Self-pay | Source: Ambulatory Visit | Attending: Obstetrics and Gynecology | Admitting: Obstetrics and Gynecology

## 2014-10-10 VITALS — BP 126/82 | Temp 98.0°F | Ht 64.0 in | Wt 182.0 lb

## 2014-10-10 DIAGNOSIS — Z1231 Encounter for screening mammogram for malignant neoplasm of breast: Secondary | ICD-10-CM

## 2014-10-10 DIAGNOSIS — Z01419 Encounter for gynecological examination (general) (routine) without abnormal findings: Secondary | ICD-10-CM

## 2014-10-10 NOTE — Progress Notes (Signed)
CLINIC:   [facility]  REASON FOR VISIT: Well-woman exam with routine gynecological exam  HISTORY OF PRESENT ILLNESS:   [name] is a 49 y.o. female who presents to the [facility] today for clinical breast exam and routine gynecological exam. No history of previous mammogram.  Her last pap smear was performed in 2011 and was negative. No history of abnormal pap.   REVIEW OF SYSTEMS:   Denies breast pain, nodularity, skin changes, nipple inversion, or nipple discharge bilaterally.  Denies any pelvic pain, pressure, or abnormal vaginal bleeding. Reports vaginal irritation, but denies discharge. Has been using petroleum jelly to her vagina due to irritability. Has not had a period in at least a few months. The patient cannot remember when her last period was.  ALLERGIES: No Known Allergies  MEDICATIONS:  Current outpatient prescriptions:  .  lisinopril (PRINIVIL,ZESTRIL) 10 MG tablet, Take 10 mg by mouth daily., Disp: , Rfl:  .  methadone (DOLOPHINE) 10 MG/ML solution, Take 34 mg by mouth daily. , Disp: , Rfl:  .  HYDROcodone-acetaminophen (NORCO/VICODIN) 5-325 MG per tablet, Take 1 tablet by mouth every 6 (six) hours as needed for moderate pain. (Patient not taking: Reported on 10/10/2014), Disp: 20 tablet, Rfl: 0 .  sulfamethoxazole-trimethoprim (BACTRIM) 400-80 MG per tablet, Take 1 tablet by mouth 2 (two) times daily. (Patient not taking: Reported on 10/10/2014), Disp: 14 tablet, Rfl: 0  PHYSICAL EXAM:   BP 126/82 mmHg  Temp(Src) 98 F (36.7 C) (Oral)  Ht 5\' 4"  (1.626 m)  Wt 182 lb (82.555 kg)  BMI 31.22 kg/m2  LMP  (LMP Unknown)  General: Well-nourished, well-appearing female in no acute distress.  She is unaccompanied in clinic today.  Leslie Infante, LPN was present during physical exam for this patient.   Breasts: Bilateral breasts exposed and observed with patient standing (arms at side, arms on hips, arms on hips flexed forward,  and arms over head).  No gross abnormalities including breast skin puckering or dimpling noted on observation.  Breasts symmetrical without evidence of skin redness, thickening, or peau d'orange appearance. No nipple retraction or nipple discharge noted bilaterally.  No breast nodularity palpated in bilateral breasts. Axillary lymph nodes: No axillary lymphadenopathy bilaterally.    GU:   -External genitalia: No lesions, swelling. White discharge noted. Even hair distribution as expected.   -Vagina: Pink, moist. No lesions noted in vaginal canal.   -Cervix: Cervix pink. Cervical os patent. No cervical discharge noted.   -Uterus: Bimanual exam demonstrates no uterine mass or tenderness on palpation. Uterus in normal position and normal size.   -Adnexae: Bimanual exam demonstrates no ovarian masses or tenderness on palpation.   -Rectovaginal: No lesions noted to rectum. Rectal tone intact.  No masses or nodularity palpated by bimanual rectovaginal exam.     ASSESSMENT & PLAN:  1. Breast cancer screening: Ms. Coull has no palpable breast abnormalities on her clinical breast exam today.  She will receive her screening mammogram as scheduled.  She will be contacted by the imaging center for results of the mammogram. She was given instructions and educational materials regarding breast self-awareness. Ms. Kugelman is aware of this plan and agrees with it.   2. Cervical cancer screening: Ms. Kotecki has a normal pelvic exam, except for white discharge today.  A pap smear was completed today per protocol.  She tolerated the procedure without complaints.  Patient was on recent antibiotics. I have recommended that she use OTC Monistat to  treat this. Discussed that she should use KY jelly or Astroglide if needed instead of petroleum jelly. She will be contacted by one of our [facility] in the next few weeks to review the results of the pap smear with the patient.     Ms. Sinatra was encouraged to  ask questions and all questions were answered to her satisfaction.      Mikey Bussing, DNP, AGPCNP-BC, [place] [phone]

## 2014-10-14 LAB — CYTOLOGY - PAP

## 2014-10-27 ENCOUNTER — Telehealth (HOSPITAL_COMMUNITY): Payer: Self-pay | Admitting: *Deleted

## 2014-10-27 NOTE — Telephone Encounter (Signed)
Telephoned patient at home # and discussed negative pap smear results. HPV results negative. Next pap smear due in five years. Patient voiced understanding.

## 2014-10-28 ENCOUNTER — Other Ambulatory Visit: Payer: Self-pay

## 2014-10-28 ENCOUNTER — Encounter (HOSPITAL_BASED_OUTPATIENT_CLINIC_OR_DEPARTMENT_OTHER): Payer: No Typology Code available for payment source | Attending: General Surgery

## 2014-10-28 DIAGNOSIS — L89891 Pressure ulcer of other site, stage 1: Secondary | ICD-10-CM | POA: Insufficient documentation

## 2014-10-28 DIAGNOSIS — F17200 Nicotine dependence, unspecified, uncomplicated: Secondary | ICD-10-CM | POA: Insufficient documentation

## 2014-10-28 DIAGNOSIS — Z79899 Other long term (current) drug therapy: Secondary | ICD-10-CM | POA: Insufficient documentation

## 2014-10-28 DIAGNOSIS — I10 Essential (primary) hypertension: Secondary | ICD-10-CM | POA: Insufficient documentation

## 2014-10-29 ENCOUNTER — Other Ambulatory Visit (HOSPITAL_COMMUNITY)
Admission: RE | Admit: 2014-10-29 | Discharge: 2014-10-29 | Disposition: A | Discharge status: Home / Self Care | Payer: No Typology Code available for payment source | Source: Ambulatory Visit | Attending: General Surgery | Admitting: General Surgery

## 2014-10-29 DIAGNOSIS — L97511 Non-pressure chronic ulcer of other part of right foot limited to breakdown of skin: Secondary | ICD-10-CM | POA: Insufficient documentation

## 2014-10-29 DIAGNOSIS — Z720 Tobacco use: Secondary | ICD-10-CM | POA: Insufficient documentation

## 2014-10-29 LAB — URIC ACID: Uric Acid, Serum: 4.1 mg/dL (ref 2.3–6.6)

## 2015-04-20 ENCOUNTER — Encounter: Payer: Self-pay | Admitting: Nurse Practitioner

## 2015-04-20 ENCOUNTER — Ambulatory Visit: Payer: Self-pay | Attending: Podiatry | Admitting: Podiatry

## 2015-04-20 DIAGNOSIS — Q828 Other specified congenital malformations of skin: Secondary | ICD-10-CM

## 2015-04-20 DIAGNOSIS — Z87898 Personal history of other specified conditions: Secondary | ICD-10-CM

## 2015-04-20 DIAGNOSIS — B351 Tinea unguium: Secondary | ICD-10-CM

## 2015-04-20 DIAGNOSIS — M79676 Pain in unspecified toe(s): Secondary | ICD-10-CM

## 2015-04-20 NOTE — Progress Notes (Signed)
   Subjective:    Patient ID: Leslie Walker, female    DOB: 05/24/1965, 50 y.o.   MRN: [id number]  HPI Patient presents to the office for concerns of thick, painful, elongated toenails which get ingrown as well as a callus on the side of the right foot. She states it was previously a wound and she had gone to the ER and the wound care center. She denies any drainage, redness, swelling. No other complaints at this time.   Review of Systems  All other systems reviewed and are negative.      Objective:   Physical Exam General: AAO x3, NAD  Dermatological: Nails hypertrophic, dystrophic, discolored, brittle x 10. No surrounding erythema or drainage. Pain to nails 1-5 bilaterally. Hyperkerotic lesion lateral right foot from previous wound and an adjacent porokertosis. Upon debridement of the lesions, no underlying ulcer present. No drainage or pus. No swelling or redness or warmth to the foot. No other open lesions.   Vascular: Dorsalis Pedis artery and Posterior Tibial artery pedal pulses are 2/4 bilateral with immedate capillary fill time. Pedal hair growth present. No varicosities and no lower extremity edema present bilateral. There is no pain with calf compression, swelling, warmth, erythema.   Musculoskeletal: No gross boney pedal deformities bilateral. No pain, crepitus, or limitation noted with foot and ankle range of motion bilateral. Muscular strength 5/5 in all groups tested bilateral.  Gait: Unassisted, Nonantalgic.      Assessment & Plan:  Symptomatic onychomycosis; porokertosis with history of ulceration -Treatment options discussed including all alternatives, risks, and complications -Currently no open lesion. Calluses were debrided without complications or bleeding -Nails sharply debrided x 10 without complications or bleeding. Discussed treatment options for nail fungus -Monitor feet daily for any skin breakdown or further issues.  -Follow-up in 3 months for routine care or  sooner if any problems arise. In the meantime, encouraged to call the office with any questions, concerns, change in symptoms.   Celesta Gentile, DPM

## 2015-08-24 ENCOUNTER — Ambulatory Visit: Payer: No Typology Code available for payment source | Attending: Podiatry

## 2015-10-13 ENCOUNTER — Other Ambulatory Visit: Payer: Self-pay | Admitting: Obstetrics and Gynecology

## 2015-10-13 DIAGNOSIS — Z1231 Encounter for screening mammogram for malignant neoplasm of breast: Secondary | ICD-10-CM

## 2015-10-30 ENCOUNTER — Ambulatory Visit (HOSPITAL_COMMUNITY)
Admission: RE | Admit: 2015-10-30 | Discharge: 2015-10-30 | Disposition: A | Discharge status: Home / Self Care | Payer: Self-pay | Source: Ambulatory Visit | Attending: Obstetrics and Gynecology | Admitting: Obstetrics and Gynecology

## 2015-10-30 ENCOUNTER — Ambulatory Visit
Admission: RE | Admit: 2015-10-30 | Discharge: 2015-10-30 | Disposition: A | Discharge status: Home / Self Care | Payer: No Typology Code available for payment source | Source: Ambulatory Visit | Attending: Obstetrics and Gynecology | Admitting: Obstetrics and Gynecology

## 2015-10-30 ENCOUNTER — Encounter (HOSPITAL_COMMUNITY): Payer: Self-pay | Admitting: *Deleted

## 2015-10-30 VITALS — BP 108/62 | Temp 97.7°F | Ht 64.0 in | Wt 159.2 lb

## 2015-10-30 DIAGNOSIS — Z1231 Encounter for screening mammogram for malignant neoplasm of breast: Secondary | ICD-10-CM

## 2015-10-30 DIAGNOSIS — Z1239 Encounter for other screening for malignant neoplasm of breast: Secondary | ICD-10-CM

## 2015-10-30 NOTE — Addendum Note (Signed)
Encounter addended by: Loletta Parish, RN on: 10/30/2015  2:54 PM<BR>    Actions taken: Sign clinical note

## 2015-10-30 NOTE — Progress Notes (Signed)
No complaints today.   Pap Smear: Pap smear not completed today. Last Pap smear was 10/10/2014 in [facility] and normal with negative HPV. Per patient has no history of an abnormal Pap smear. Last Pap smear result is in EPIC.  Physical exam: Breasts Breasts symmetrical. No skin abnormalities bilateral breasts. No nipple retraction bilateral breasts. No nipple discharge bilateral breasts. No lymphadenopathy. No lumps palpated bilateral breasts. No complaints of pain or tenderness on exam. Referred patient to the [facility] for a screening mammogram. Appointment scheduled for Friday, October 30, 2015 at 1210.        Pelvic/Bimanual No Pap smear completed today since last Pap smear and HPV typing 10/10/2014. Pap smear not indicated per BCCCP guidelines.   Smoking History: Patient is a current smoker. Discussed smoking cessation with patient. Referred patient to the [facility] Quitline and gave resources to free smoking cessation classes offered at [facility].  Patient Navigation: Patient education provided. Access to services provided for patient through [facility] program.   Colorectal Cancer Screening: Per patient has never had a colonoscopy completed. No complaints today.

## 2015-10-30 NOTE — Patient Instructions (Addendum)
Explained breast self awareness to Leslie Walker. Patient did not need a Pap smear today due to last Pap smear and HPV typing was 10/10/2014. Let her know BCCCP will cover Pap smears and HPV Typing  every 5 years unless has a history of abnormal Pap smears. Referred patient to the [facility] for a screening mammogram. Appointment scheduled for Friday, October 30, 2015 at 1210. Let patient know the Breast Center will follow up with her within the next couple weeks with results of mammogram by letter or phone.Discussed smoking cessation with patient. Referred patient to the [facility] Quitline and gave resources to free smoking cessation classes offered at [facility]. Leslie Walker verbalized understanding.  Sebastien Jackson, [place], RN 2:30 PM

## 2015-11-03 ENCOUNTER — Encounter (HOSPITAL_COMMUNITY): Payer: Self-pay | Admitting: *Deleted

## 2015-11-05 ENCOUNTER — Other Ambulatory Visit: Payer: Self-pay | Admitting: Obstetrics and Gynecology

## 2015-11-05 DIAGNOSIS — R928 Other abnormal and inconclusive findings on diagnostic imaging of breast: Secondary | ICD-10-CM

## 2015-12-30 ENCOUNTER — Ambulatory Visit
Admission: RE | Admit: 2015-12-30 | Discharge: 2015-12-30 | Disposition: A | Discharge status: Home / Self Care | Payer: No Typology Code available for payment source | Source: Ambulatory Visit | Attending: Obstetrics and Gynecology | Admitting: Obstetrics and Gynecology

## 2015-12-30 DIAGNOSIS — R928 Other abnormal and inconclusive findings on diagnostic imaging of breast: Secondary | ICD-10-CM

## 2016-08-22 ENCOUNTER — Ambulatory Visit: Payer: Self-pay | Attending: Podiatry | Admitting: Podiatry

## 2016-08-22 DIAGNOSIS — M79676 Pain in unspecified toe(s): Secondary | ICD-10-CM

## 2016-08-22 DIAGNOSIS — Q828 Other specified congenital malformations of skin: Secondary | ICD-10-CM

## 2016-08-22 DIAGNOSIS — L84 Corns and callosities: Secondary | ICD-10-CM

## 2016-08-22 NOTE — Progress Notes (Signed)
*  Seen at the [facility]**  Subjective: 51 y.o. returns the office today for painful, elongated, thickened toenails which she cannot trim herself. Denies any redness or drainage around the nails. Denies any acute changes since last appointment and no new complaints today. Denies any systemic complaints such as fevers, chills, nausea, vomiting.   Objective: AAO 3, NAD DP/PT pulses palpable, CRT less than 3 seconds Nails hypertrophic, dystrophic, elongated, brittle, discolored 10. There is tenderness overlying the nails 1-5 bilaterally. There is no surrounding erythema or drainage along the nail sites. Hyperkeratotic tissue right plantar 5th metatarsal base. Upon debridement, no underlying ulceration, drainage, or signs of infection.  No open lesions or pre-ulcerative lesions are identified. No other areas of tenderness bilateral lower extremities. No overlying edema, erythema, increased warmth. No pain with calf compression, swelling, warmth, erythema.  Assessment: Patient presents with symptomatic onychomycosis; hyperkeratotic lesion   Plan: -Treatment options including alternatives, risks, complications were discussed -Nails sharply debrided 10 without complication/bleeding. -Hyperkeratotic lesion sharply debrided x 1 without complications or bleeding -Discussed daily foot inspection. If there are any changes, to call the office immediately.  -Follow-up in 3 months or sooner if any problems are to arise. In the meantime, encouraged to call the office with any questions, concerns, changes symptoms.  Celesta Gentile, DPM

## 2016-12-19 ENCOUNTER — Encounter: Payer: Self-pay | Admitting: Nurse Practitioner

## 2016-12-19 ENCOUNTER — Ambulatory Visit: Payer: Self-pay | Attending: Internal Medicine | Admitting: Podiatry

## 2016-12-19 DIAGNOSIS — M79676 Pain in unspecified toe(s): Secondary | ICD-10-CM

## 2016-12-19 DIAGNOSIS — Q828 Other specified congenital malformations of skin: Secondary | ICD-10-CM

## 2016-12-19 DIAGNOSIS — B351 Tinea unguium: Secondary | ICD-10-CM

## 2016-12-20 NOTE — Progress Notes (Signed)
*  Seen at the [facility]**  Subjective: 51 y.o. returns the office today for painful, elongated, thickened toenails which she cannot trim herself. Denies any redness or drainage around the nails. Denies any acute changes since last appointment and no new complaints today. Denies any systemic complaints such as fevers, chills, nausea, vomiting.   Objective: AAO 3, NAD DP/PT pulses palpable, CRT less than 3 seconds Nails hypertrophic, dystrophic, elongated, brittle, discolored 10. There is tenderness overlying the nails 1-5 bilaterally. There is no surrounding erythema or drainage along the nail sites. Hyperkeratotic tissue right plantar 5th metatarsal base. Upon debridement, no underlying ulceration, drainage, or signs of infection.  No open lesions or pre-ulcerative lesions are identified. No other areas of tenderness bilateral lower extremities. No overlying edema, erythema, increased warmth. No pain with calf compression, swelling, warmth, erythema. No acute changes.   Assessment: Patient presents with symptomatic onychomycosis; hyperkeratotic lesion   Plan: -Treatment options including alternatives, risks, complications were discussed -Nails sharply debrided 10 without complication/bleeding. -Hyperkeratotic lesion sharply debrided x 1 without complications or bleeding -Discussed daily foot inspection. If there are any changes, to call the office immediately.  -Follow-up in 3 months or sooner if any problems are to arise. In the meantime, encouraged to call the office with any questions, concerns, changes symptoms.  Celesta Gentile, DPM

## 2017-01-30 ENCOUNTER — Other Ambulatory Visit: Payer: Self-pay | Admitting: Obstetrics and Gynecology

## 2017-01-30 DIAGNOSIS — Z1231 Encounter for screening mammogram for malignant neoplasm of breast: Secondary | ICD-10-CM

## 2017-02-20 ENCOUNTER — Ambulatory Visit: Payer: Self-pay | Attending: Internal Medicine | Admitting: Sports Medicine

## 2017-02-20 ENCOUNTER — Encounter: Payer: Self-pay | Admitting: Nurse Practitioner

## 2017-02-20 DIAGNOSIS — Q828 Other specified congenital malformations of skin: Secondary | ICD-10-CM

## 2017-02-20 DIAGNOSIS — B351 Tinea unguium: Secondary | ICD-10-CM

## 2017-02-20 NOTE — Progress Notes (Signed)
Subjective: Leslie Walker is a 52 y.o. female patient who presents to clinic for evaluation of Right foot pain secondary to callus skin. Patient complains of pain at the lesion present on the side of the right foot. Patient has tried creams with no relief in symptoms. Reports that she comes to free clinic to have it trimmed which helps. Patient also desires big toenails to be trimmed. Patient denies any other pedal complaints.   Patient Active Problem List   Diagnosis Date Noted  . Nail fungus 04/20/2015  . Porokeratosis 04/20/2015  . History of ulceration 04/20/2015  . Foot ulcer ([place]) 02/17/2014  . Cellulitis of foot, right 02/17/2014  . Chronic pain syndrome 02/17/2014  . Essential hypertension 02/17/2014    Current Outpatient Medications on File Prior to Visit  Medication Sig Dispense Refill  . cholecalciferol (VITAMIN D) 400 units TABS tablet Take 400 Units by mouth.    Marland Kitchen HYDROcodone-acetaminophen (NORCO/VICODIN) 5-325 MG per tablet Take 1 tablet by mouth every 6 (six) hours as needed for moderate pain. (Patient not taking: Reported on 10/30/2015) 20 tablet 0  . lisinopril (PRINIVIL,ZESTRIL) 10 MG tablet Take 10 mg by mouth daily.    . methadone (DOLOPHINE) 10 MG/ML solution Take 34 mg by mouth daily.     Marland Kitchen sulfamethoxazole-trimethoprim (BACTRIM) 400-80 MG per tablet Take 1 tablet by mouth 2 (two) times daily. (Patient not taking: Reported on 10/30/2015) 14 tablet 0   No current facility-administered medications on file prior to visit.     No Known Allergies  Objective:  General: Alert and oriented x3 in no acute distress  Dermatology: Keratotic lesion present lateral right foot with skin lines transversing the lesion, pain is present with direct pressure to the lesion with a central nucleated core noted, no webspace macerations, no ecchymosis bilateral, all nails x 10 are short and thick but well manicured.  Vascular: Dorsalis Pedis and Posterior Tibial pedal pulses 2/4,  Capillary Fill Time 3 seconds, + pedal hair growth bilateral, no edema bilateral lower extremities, Temperature gradient within normal limits.  Neurology: Johney Maine sensation intact via light touch bilateral.  Musculoskeletal: Mild tenderness with palpation at the keratotic lesion site on Right, Muscular strength 5/5 in all groups without pain or limitation on range of motion.  Assessment and Plan: Problem List Items Addressed This Visit      Musculoskeletal and Integument   Nail fungus   Porokeratosis - Primary      -Complete examination performed -Discussed treatment options for keratosis  -Parred keratoic lesion using a chisel blade -Encouraged daily skin emollients -Encouraged use of pumice stone -Advised good supportive shoes and inserts -Recommend for nails vinegar soaks and tea tree oil with daily filing to help with thickness on nails  -Patient to return to clinic as needed.  Landis Martins, DPM

## 2017-02-21 ENCOUNTER — Ambulatory Visit
Admission: RE | Admit: 2017-02-21 | Discharge: 2017-02-21 | Disposition: A | Discharge status: Home / Self Care | Payer: No Typology Code available for payment source | Source: Ambulatory Visit | Attending: Obstetrics and Gynecology | Admitting: Obstetrics and Gynecology

## 2017-02-21 ENCOUNTER — Encounter (HOSPITAL_COMMUNITY): Payer: Self-pay

## 2017-02-21 ENCOUNTER — Ambulatory Visit (HOSPITAL_COMMUNITY)
Admission: RE | Admit: 2017-02-21 | Discharge: 2017-02-21 | Disposition: A | Discharge status: Home / Self Care | Payer: Self-pay | Source: Ambulatory Visit | Attending: Obstetrics and Gynecology | Admitting: Obstetrics and Gynecology

## 2017-02-21 ENCOUNTER — Encounter: Payer: Self-pay | Admitting: Sports Medicine

## 2017-02-21 ENCOUNTER — Ambulatory Visit: Payer: Self-pay

## 2017-02-21 VITALS — BP 120/78 | Ht 64.0 in | Wt 146.0 lb

## 2017-02-21 DIAGNOSIS — Z1231 Encounter for screening mammogram for malignant neoplasm of breast: Secondary | ICD-10-CM

## 2017-02-21 DIAGNOSIS — Z1239 Encounter for other screening for malignant neoplasm of breast: Secondary | ICD-10-CM

## 2017-02-21 NOTE — Addendum Note (Signed)
Encounter addended by: Loletta Parish, RN on: 02/21/2017 4:01 PM  Actions taken: Sign clinical note

## 2017-02-21 NOTE — Progress Notes (Signed)
No complaints today.   Pap Smear: Pap smear not completed today. Last Pap smear was 10/10/2014 at [facility] and normal with negative HPV. Per patient has no history of an abnormal Pap smear. Last Pap smear result is in Epic.  Physical exam: Breasts Breasts symmetrical. No skin abnormalities bilateral breasts. No nipple retraction bilateral breasts. No nipple discharge bilateral breasts. No lymphadenopathy. No lumps palpated bilateral breasts. No complaints of pain or tenderness on exam. Referred patient to the [facility] for a screening mammogram. Appointment scheduled for Tuesday, February 21, 2017 at 1610.        Pelvic/Bimanual No Pap smear completed today since last Pap smear and HPV typing was 10/10/2014. Pap smear not indicated per BCCCP guidelines.   Smoking History: Patient is a current smoker. Discussed smoking cessation with patient. Referred patient to the [facility] Quitline and gave resources to the free smoking cessation classes at [facility].   Patient Navigation: Patient education provided. Access to services provided for patient through [place] program.   Colorectal Cancer Screening: Per patient has never had a colonoscopy completed. No complaints today. FIT Test given to patient to complete and return to B[facility]CP.

## 2017-02-21 NOTE — Patient Instructions (Addendum)
Explained breast self awareness with Maicee Coker. Patient did not need a Pap smear today due to last Pap smear and HPV typing was 10/10/2014. Let her know B[facility]CP will cover Pap smears and HPV typing every 5 years unless has a history of abnormal Pap smears. Referred patient to the [facility] for a screening mammogram. Appointment scheduled for Tuesday, February 21, 2017 at 1610. Patient aware of appointment and will be there. Let patient know the [facility] will follow up with her within the next couple weeks with results of mammogram by letter or phone. Discussed smoking cessation with patient. Referred patient to the [facility] Quitline and gave resources to the free smoking cessation classes at [facility]. Leslie Walker verbalized understanding.  Brannock, Arvil Chaco, RN 3:10 PM

## 2017-02-22 ENCOUNTER — Encounter (HOSPITAL_COMMUNITY): Payer: Self-pay | Admitting: *Deleted

## 2017-04-17 ENCOUNTER — Ambulatory Visit: Payer: No Typology Code available for payment source

## 2018-02-05 ENCOUNTER — Other Ambulatory Visit (HOSPITAL_COMMUNITY): Payer: Self-pay | Admitting: *Deleted

## 2018-02-05 DIAGNOSIS — Z1231 Encounter for screening mammogram for malignant neoplasm of breast: Secondary | ICD-10-CM

## 2018-02-12 ENCOUNTER — Ambulatory Visit: Payer: Self-pay | Attending: Podiatry | Admitting: Podiatry

## 2018-02-12 ENCOUNTER — Encounter: Payer: Self-pay | Admitting: General Practice

## 2018-02-12 DIAGNOSIS — Q828 Other specified congenital malformations of skin: Secondary | ICD-10-CM

## 2018-02-12 DIAGNOSIS — B351 Tinea unguium: Secondary | ICD-10-CM

## 2018-02-14 ENCOUNTER — Telehealth: Payer: Self-pay | Admitting: *Deleted

## 2018-02-14 MED ORDER — CICLOPIROX 8 % EX SOLN: Freq: Every day | CUTANEOUS | 4 refills | Status: DC

## 2018-02-14 NOTE — Telephone Encounter (Signed)
Called and spoke with the patient and patient stated that she would like to have the RX sent to [address] [facility] and I called the pharmacy as well. Lattie Haw

## 2018-02-14 NOTE — Progress Notes (Signed)
Subjective: 53 year old female presents the office today for concerns of thick, discolored, elongated toenails that she cannot trim her self as well as for calluses on the outside aspect of her right plantar foot.  The nails and calluses do become painful at times.  She denies any open sores and she denies any redness or drainage or any swelling to the toenail or callus sites. Denies any systemic complaints such as fevers, chills, nausea, vomiting. No acute changes since last appointment, and no other complaints at this time.   Objective: AAO x3, NAD DP/PT pulses palpable bilaterally, CRT less than 3 seconds Nails are hypertrophic, dystrophic, discolored, elongated x10.  There is no surrounding erythema and there is no drainage or pus.  Thick hyperkeratotic lesion plantar aspect of the lateral right foot.  Upon debridement there is no underlying ulceration, drainage or any signs of infection. No open lesions or pre-ulcerative lesions.  No pain with calf compression, swelling, warmth, erythema  Assessment: Symptomatic onychomycosis, hyperkeratotic lesion  Plan: -All treatment options discussed with the patient including all alternatives, risks, complications.  -Sharply debrided the toenails as well as the hyperkeratotic lesion without any complications or bleeding.  Discussed moisturizer to the callus daily.  Prescribed Penlac for nail fungus and discussed side effects, success rates and application instructions. -Patient encouraged to call the office with any questions, concerns, change in symptoms.   Trula Slade DPM

## 2018-02-14 NOTE — Telephone Encounter (Signed)
Called and spoke with [place] at [facility] and called the RX into due to the instructions were so long. Lattie Haw

## 2018-04-19 ENCOUNTER — Other Ambulatory Visit (HOSPITAL_COMMUNITY): Payer: Self-pay | Admitting: *Deleted

## 2018-04-19 DIAGNOSIS — Z1231 Encounter for screening mammogram for malignant neoplasm of breast: Secondary | ICD-10-CM

## 2018-05-10 ENCOUNTER — Ambulatory Visit (HOSPITAL_COMMUNITY): Payer: Self-pay

## 2018-06-24 ENCOUNTER — Inpatient Hospital Stay (HOSPITAL_COMMUNITY)
Admission: EM | Admit: 2018-06-24 | Discharge: 2018-06-30 | DRG: 041 | Disposition: A | Discharge status: Home / Self Care | Payer: Self-pay | Attending: Internal Medicine | Admitting: Internal Medicine

## 2018-06-24 ENCOUNTER — Observation Stay (HOSPITAL_COMMUNITY): Payer: Self-pay

## 2018-06-24 ENCOUNTER — Encounter (HOSPITAL_COMMUNITY): Payer: Self-pay

## 2018-06-24 ENCOUNTER — Other Ambulatory Visit: Payer: Self-pay

## 2018-06-24 ENCOUNTER — Emergency Department (HOSPITAL_COMMUNITY): Payer: Self-pay

## 2018-06-24 DIAGNOSIS — Z8249 Family history of ischemic heart disease and other diseases of the circulatory system: Secondary | ICD-10-CM

## 2018-06-24 DIAGNOSIS — Z1159 Encounter for screening for other viral diseases: Secondary | ICD-10-CM

## 2018-06-24 DIAGNOSIS — Z791 Long term (current) use of non-steroidal anti-inflammatories (NSAID): Secondary | ICD-10-CM

## 2018-06-24 DIAGNOSIS — I959 Hypotension, unspecified: Secondary | ICD-10-CM | POA: Diagnosis not present

## 2018-06-24 DIAGNOSIS — I10 Essential (primary) hypertension: Secondary | ICD-10-CM | POA: Diagnosis present

## 2018-06-24 DIAGNOSIS — G459 Transient cerebral ischemic attack, unspecified: Secondary | ICD-10-CM | POA: Diagnosis present

## 2018-06-24 DIAGNOSIS — F1123 Opioid dependence with withdrawal: Secondary | ICD-10-CM | POA: Diagnosis not present

## 2018-06-24 DIAGNOSIS — I639 Cerebral infarction, unspecified: Secondary | ICD-10-CM

## 2018-06-24 DIAGNOSIS — D259 Leiomyoma of uterus, unspecified: Secondary | ICD-10-CM | POA: Diagnosis present

## 2018-06-24 DIAGNOSIS — Z79899 Other long term (current) drug therapy: Secondary | ICD-10-CM

## 2018-06-24 DIAGNOSIS — G8324 Monoplegia of upper limb affecting left nondominant side: Secondary | ICD-10-CM | POA: Diagnosis present

## 2018-06-24 DIAGNOSIS — F1120 Opioid dependence, uncomplicated: Secondary | ICD-10-CM | POA: Diagnosis present

## 2018-06-24 DIAGNOSIS — I352 Nonrheumatic aortic (valve) stenosis with insufficiency: Secondary | ICD-10-CM | POA: Diagnosis present

## 2018-06-24 DIAGNOSIS — R200 Anesthesia of skin: Secondary | ICD-10-CM | POA: Diagnosis present

## 2018-06-24 DIAGNOSIS — E785 Hyperlipidemia, unspecified: Secondary | ICD-10-CM | POA: Diagnosis present

## 2018-06-24 DIAGNOSIS — R29702 NIHSS score 2: Secondary | ICD-10-CM | POA: Diagnosis present

## 2018-06-24 DIAGNOSIS — F17290 Nicotine dependence, other tobacco product, uncomplicated: Secondary | ICD-10-CM | POA: Diagnosis present

## 2018-06-24 DIAGNOSIS — I6340 Cerebral infarction due to embolism of unspecified cerebral artery: Principal | ICD-10-CM | POA: Diagnosis present

## 2018-06-24 DIAGNOSIS — K2970 Gastritis, unspecified, without bleeding: Secondary | ICD-10-CM | POA: Diagnosis not present

## 2018-06-24 LAB — COMPREHENSIVE METABOLIC PANEL
ALT: 13 U/L (ref 0–44)
AST: 19 U/L (ref 15–41)
Albumin: 4.2 g/dL (ref 3.5–5.0)
Alkaline Phosphatase: 74 U/L (ref 38–126)
Anion gap: 7 (ref 5–15)
BUN: 12 mg/dL (ref 6–20)
CO2: 29 mmol/L (ref 22–32)
Calcium: 9.3 mg/dL (ref 8.9–10.3)
Chloride: 104 mmol/L (ref 98–111)
Creatinine, Ser: 0.79 mg/dL (ref 0.44–1.00)
GFR calc Af Amer: >60 mL/min (ref >60)
GFR calc non Af Amer: >60 mL/min (ref >60)
Glucose, Bld: 108 mg/dL — ABNORMAL HIGH (ref 70–99)
Potassium: 3.9 mmol/L (ref 3.5–5.1)
Sodium: 140 mmol/L (ref 135–145)
Total Bilirubin: 0.7 mg/dL (ref 0.3–1.2)
Total Protein: 8 g/dL (ref 6.5–8.1)

## 2018-06-24 LAB — URINALYSIS, ROUTINE W REFLEX MICROSCOPIC
Bilirubin Urine: NEGATIVE
Glucose, UA: NEGATIVE mg/dL
Hgb urine dipstick: NEGATIVE
Ketones, ur: NEGATIVE mg/dL
Nitrite: NEGATIVE
Protein, ur: NEGATIVE mg/dL
Specific Gravity, Urine: 1.017 (ref 1.005–1.030)
pH: 5 (ref 5.0–8.0)

## 2018-06-24 LAB — CBC
HCT: 41 % (ref 36.0–46.0)
Hemoglobin: 14.4 g/dL (ref 12.0–15.0)
MCH: 29.5 pg (ref 26.0–34.0)
MCHC: 35.1 g/dL (ref 30.0–36.0)
MCV: 84 fL (ref 80.0–100.0)
Platelets: 227 10*3/uL (ref 150–400)
RBC: 4.88 MIL/uL (ref 3.87–5.11)
RDW: 13.3 % (ref 11.5–15.5)
WBC: 7 10*3/uL (ref 4.0–10.5)
nRBC: 0 % (ref 0.0–0.2)

## 2018-06-24 LAB — SARS CORONAVIRUS 2 BY RT PCR (HOSPITAL ORDER, PERFORMED IN ~~LOC~~ HOSPITAL LAB): SARS Coronavirus 2: NEGATIVE

## 2018-06-24 LAB — PROTIME-INR
INR: 0.9 (ref 0.8–1.2)
Prothrombin Time: 12 seconds (ref 11.4–15.2)

## 2018-06-24 MED ORDER — ACETAMINOPHEN 650 MG RE SUPP: 650.0000 mg | RECTAL | Status: DC | PRN

## 2018-06-24 MED ORDER — ATORVASTATIN CALCIUM 20 MG PO TABS
20.0000 mg | ORAL_TABLET | Freq: Every day | ORAL | Status: DC
  Administered 2018-06-24: 18:00:00 20 mg via ORAL
  Filled 2018-06-24: qty 1

## 2018-06-24 MED ORDER — ASPIRIN 325 MG PO TABS
325.0000 mg | ORAL_TABLET | Freq: Every day | ORAL | Status: DC
  Administered 2018-06-24 – 2018-06-26 (×2): 325 mg via ORAL
  Filled 2018-06-24 (×3): qty 1

## 2018-06-24 MED ORDER — ACETAMINOPHEN 325 MG PO TABS
650.0000 mg | ORAL_TABLET | ORAL | Status: DC | PRN
  Administered 2018-06-26 – 2018-06-27 (×3): 650 mg via ORAL
  Filled 2018-06-24 (×3): qty 2

## 2018-06-24 MED ORDER — ASPIRIN 300 MG RE SUPP
300.0000 mg | Freq: Every day | RECTAL | Status: DC
  Administered 2018-06-25: 300 mg via RECTAL
  Filled 2018-06-24 (×2): qty 1

## 2018-06-24 MED ORDER — SODIUM CHLORIDE 0.9 % IV SOLN
INTRAVENOUS | Status: DC
  Administered 2018-06-24 – 2018-06-25 (×2): via INTRAVENOUS

## 2018-06-24 MED ORDER — ACETAMINOPHEN 160 MG/5ML PO SOLN: 650.0000 mg | ORAL | Status: DC | PRN

## 2018-06-24 MED ORDER — ENOXAPARIN SODIUM 40 MG/0.4ML ~~LOC~~ SOLN
40.0000 mg | SUBCUTANEOUS | Status: DC
  Administered 2018-06-24 – 2018-06-29 (×5): 40 mg via SUBCUTANEOUS
  Filled 2018-06-24 (×5): qty 0.4

## 2018-06-24 MED ORDER — STROKE: EARLY STAGES OF RECOVERY BOOK
Freq: Once | Status: AC
  Administered 2018-06-24: 18:00:00

## 2018-06-24 NOTE — H&P (Signed)
History and Physical        Hospital Admission Note Date: 06/24/2018  Patient name: Leslie Walker Medical record number: [id number] Date of birth: [birth year] Age: 53 y.o. Gender: female  PCP: Elbert Ewings, FNP    Patient coming from: Home  I have reviewed all records in the [facility].    Chief Complaint:  Left facial numbness, left arm, hand weakness, numbness since morning  HPI: Patient is a 53 year old female with history of hypertension presented to ED with left facial numbness and left arm, hand numbness and weakness.  History was obtained from the patient who reported that she was in her baseline state of health until last night.  This morning she woke up around 9 AM and felt numbness on her left side of the face, numbness in her left arm.  She felt her left hand was somewhat clumsy and dropping things. No visual blurring, any other focal weakness, slurred speech or confusion.  No prior history of stroke or TIA.  Not on aspirin prior to admission.  ED work-up/course:  Temp 98.4, respirate 14, BP 194/84 subsequently improved to 156/82, O2 sats 100% on room air CBC, BMET unremarkable COVID-19 negative CT head negative for acute CVA, probable mild chronic small vessel ischemia  Review of Systems: Positives marked in 'bold' Constitutional: Denies fever, chills, diaphoresis, poor appetite and fatigue.  HEENT: Denies photophobia, eye pain, redness, hearing loss, ear pain, congestion, sore throat, rhinorrhea, sneezing, mouth sores, trouble swallowing, neck pain, neck stiffness and tinnitus.   Respiratory: Denies SOB, DOE, cough, chest tightness,  and wheezing.   Cardiovascular: Denies chest pain, palpitations and leg swelling.  Gastrointestinal: Denies nausea, vomiting, abdominal pain, diarrhea, constipation, blood in stool and abdominal distention.  Genitourinary:  Denies dysuria, urgency, frequency, hematuria, flank pain and difficulty urinating.  Musculoskeletal: Denies myalgias, back pain, joint swelling, arthralgias and gait problem.  Skin: Denies pallor, rash and wound.  Neurological: See HPI Hematological: Denies adenopathy. Easy bruising, personal or family bleeding history  Psychiatric/Behavioral: Denies suicidal ideation, mood changes, confusion, nervousness, sleep disturbance and agitation  Past Medical History: Past Medical History:  Diagnosis Date  . Hypertension    Past surgical history History reviewed. No pertinent surgical history.  Medications: Prior to Admission medications   Medication Sig Start Date End Date Taking? Authorizing Provider  cholecalciferol (VITAMIN D) 400 units TABS tablet Take 400 Units by mouth.   Yes [provider]  lisinopril-hydrochlorothiazide (ZESTORETIC) 20-12.5 MG tablet Take 1 tablet by mouth daily. 05/15/18  Yes [provider]  meloxicam (MOBIC) 7.5 MG tablet Take 7.5 mg by mouth daily. 05/15/18  Yes [provider]  metoprolol tartrate (LOPRESSOR) 25 MG tablet Take 25 mg by mouth 2 (two) times daily. 05/15/18  Yes [provider]  ciclopirox (PENLAC) 8 % solution Apply topically at bedtime. Apply over nail and surrounding skin. Apply daily over previous coat. After seven (7) days, may remove with alcohol and continue cycle. Patient not taking: Reported on 06/24/2018 02/14/18   Trula Slade, DPM  HYDROcodone-acetaminophen (NORCO/VICODIN) 5-325 MG per tablet Take 1 tablet by mouth every 6 (six) hours as needed for moderate pain. Patient not taking: Reported  on 10/30/2015 02/18/14   Thurnell Lose, MD  sulfamethoxazole-trimethoprim (BACTRIM) 400-80 MG per tablet Take 1 tablet by mouth 2 (two) times daily. Patient not taking: Reported on 10/30/2015 02/18/14   Thurnell Lose, MD    Allergies:  No Known Allergies  Social History:  reports that she has been smoking  cigars. She has never used smokeless tobacco. She reports that she does not drink alcohol or use drugs.  Family History: Family History  Problem Relation Age of Onset  . Hypertension Mother   . Hypertension Father   . Hypertension Brother     Physical Exam: Blood pressure (!) 156/82, pulse 68, temperature 98.4 F (36.9 C), temperature source Oral, resp. rate 13, height 5\' 4"  (1.626 m), weight 69.9 kg, SpO2 98 %. General: Alert, awake, oriented x3, in no acute distress. Eyes: pink conjunctiva,anicteric sclera, pupils equal and reactive to light and accomodation, HEENT: normocephalic, atraumatic, oropharynx clear Neck: supple, no masses or lymphadenopathy, no goiter, no bruits, no JVD CVS: Regular rate and rhythm, without murmurs, rubs or gallops. No lower extremity edema Resp : Clear to auscultation bilaterally, no wheezing, rales or rhonchi. GI : Soft, nontender, nondistended, positive bowel sounds, no masses. No hepatomegaly. No hernia.  Musculoskeletal: No clubbing or cyanosis, positive pedal pulses. No contracture. ROM intact  Neuro: Cranial nerves II to XII intact.  Strength 5/5 upper and lower extremity on the right, left LUE 3-4/5, left handgrip decreased, left lower extremity 5/5 Psych: alert and oriented x 3, normal mood and affect Skin: no rashes or lesions, warm and dry   LABS on Admission: I have personally reviewed all the labs and imagings below    Basic Metabolic Panel: Recent Labs  Lab 06/24/18 1404  NA 140  K 3.9  CL 104  CO2 29  GLUCOSE 108*  BUN 12  CREATININE 0.79  CALCIUM 9.3   Liver Function Tests: Recent Labs  Lab 06/24/18 1404  AST 19  ALT 13  ALKPHOS 74  BILITOT 0.7  PROT 8.0  ALBUMIN 4.2   No results for input(s): LIPASE, AMYLASE in the last 168 hours. No results for input(s): AMMONIA in the last 168 hours. CBC: Recent Labs  Lab 06/24/18 1404  WBC 7.0  HGB 14.4  HCT 41.0  MCV 84.0  PLT 227   Cardiac Enzymes: No results for  input(s): CKTOTAL, CKMB, CKMBINDEX, TROPONINI in the last 168 hours. BNP: Invalid input(s): POCBNP CBG: No results for input(s): GLUCAP in the last 168 hours.  Radiological Exams on Admission:  Ct Head Wo Contrast  Result Date: 06/24/2018 CLINICAL DATA:  Left hand and face numbness upon awakening EXAM: CT HEAD WITHOUT CONTRAST TECHNIQUE: Contiguous axial images were obtained from the base of the skull through the vertex without intravenous contrast. COMPARISON:  None. FINDINGS: Brain: No evidence of acute infarction, hemorrhage, hydrocephalus, extra-axial collection or mass lesion/mass effect. Probable mild white matter disease with patchy low-density in the cerebral white matter. History of hypertension suggests this is chronic microvascular ischemia. Normal brain volume Vascular: No hyperdense vessel. There is atherosclerotic calcification. Skull: Normal. Negative for fracture or focal lesion. Sinuses/Orbits: No acute finding. IMPRESSION: 1. No acute finding.  No visible infarct. 2. Probable mild chronic small vessel ischemia. Electronically Signed   By: Monte Fantasia M.D.   On: 06/24/2018 15:10      EKG: Independently reviewed.  EKG rate 77, early repolarization changes   Assessment/Plan Principal Problem:   TIA (transient ischemic attack) stenting with left facial numbness, left  arm numbness -Per patient left facial numbness has improved, still feels some clumsiness in the left hand -CT head negative for acute CVA, obtain MRI, MRA brain -Obtain 2D echo, carotid Dopplers -Obtain hemoglobin A1c, lipid panel -Placed on aspirin p.o./PR, Lipitor 20 mg daily - will consult neurology if MRI positive for CVA  Active Problems:   Essential hypertension -Given acute neurological deficits and suspicion of stroke, hold oral antihypertensives -Placed on gentle hydration  DVT prophylaxis: Lovenox  CODE STATUS: Full CODE STATUS  Consults called: None, awaiting MRI  Family Communication:  Admission, patients condition and plan of care including tests being ordered have been discussed with the patient who indicates understanding and agree with the plan and Code Status  Admission status: Observation telemetry  Disposition plan: Further plan will depend as patient's clinical course evolves and further radiologic and laboratory data become available.    At the time of admission, it appears that the appropriate admission status for this patient is observation. This is judged to be reasonable and necessary in order to provide the required intensity of service to ensure the patient's safety given the presenting symptoms of left-sided numbness, rule out stroke, physical exam findings, and initial radiographic and laboratory data in the context of their chronic comorbidities.  The medical decision making on this patient was of high complexity and the patient is at high risk for clinical deterioration, therefore this is a level 3 visit.     Time Spent on Admission: 60 minutes    Ripudeep Rai M.D. [facility] 06/24/2018, 4:07 PM

## 2018-06-24 NOTE — ED Provider Notes (Signed)
[facility] Emergency Department Provider Note MRN:  [id number]  Arrival date & time: 06/24/18     Chief Complaint   Numbness   History of Present Illness   Leslie Walker is a 53 y.o. year-old female with a history of hypertension presenting to the ED with chief complaint of numbness.  For the past several days, patient has been endorsing intermittent numbness to the left side of the face as well as numbness and weakness to the left arm.  Left arm feels normal today.  Patient felt completely normal last night before bed at about midnight, woke up with numbness to the left side of the face.  Denies fever, no cough, no chest pain or shortness of breath, no abdominal pain, no leg weakness or numbness, no other symptoms.  Review of Systems  A complete 10 system review of systems was obtained and all systems are negative except as noted in the HPI and PMH.   Patient's Health History    Past Medical History:  Diagnosis Date  . Hypertension     History reviewed. No pertinent surgical history.  Family History  Problem Relation Age of Onset  . Hypertension Mother   . Hypertension Father   . Hypertension Brother     Social History   Socioeconomic History  . Marital status: Single    Spouse name: Not on file  . Number of children: Not on file  . Years of education: Not on file  . Highest education level: Not on file  Occupational History  . Not on file  Social Needs  . Financial resource strain: Not on file  . Food insecurity:    Worry: Not on file    Inability: Not on file  . Transportation needs:    Medical: Not on file    Non-medical: Not on file  Tobacco Use  . Smoking status: Current Every Day Smoker    Types: Cigars  . Smokeless tobacco: Never Used  Substance and Sexual Activity  . Alcohol use: No  . 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 Sexual activity: Yes  Lifestyle  . Physical activity:    Days per week: 0 days   Minutes per session: 0 min  . Stress: Not at all  Relationships  . Social connections:    Talks on phone: More than three times a week    Gets together: Once a week    Attends religious service: 1 to 4 times per year    Active member of club or organization: No    Attends meetings of clubs or organizations: Never    Relationship status: Never married  . Intimate partner violence:    Fear of current or ex partner: No    Emotionally abused: No    Physically abused: No    Forced sexual activity: No  Other Topics Concern  . Not on file  Social History Narrative  . Not on file     Physical Exam  Vital Signs and Nursing Notes reviewed Vitals:   06/24/18 1333 06/24/18 1415  BP: (!) 194/84 (!) 159/74  Pulse: 64 71  Resp: 14 20  Temp: 98.4 F (36.9 C)   SpO2: 100% 99%    CONSTITUTIONAL: Chronically ill-appearing, NAD NEURO:  Alert and oriented x 3, normal and symmetric strength and sensation to the arms and legs, no facial droop, subjective decreased sensation to the left face, no aphasia, no dysarthria, no neglect, no visual  field cuts EYES:  eyes equal and reactive ENT/NECK:  no LAD, no JVD CARDIO: Regular rate, well-perfused, normal S1 and S2 PULM:  CTAB no wheezing or rhonchi GI/GU:  normal bowel sounds, non-distended, non-tender MSK/SPINE:  No gross deformities, no edema SKIN:  no rash, atraumatic PSYCH:  Appropriate speech and behavior  Diagnostic and Interventional Summary    EKG Interpretation  Date/Time:  Sunday June 24 2018 13:55:01 EDT Ventricular Rate:  77 PR Interval:    QRS Duration: 77 QT Interval:  390 QTC Calculation: 442 R Axis:   61 Text Interpretation:  Sinus rhythm RAE, consider biatrial enlargement ST elev, probable normal early repol pattern Confirmed by Gerlene Fee [id number]) on 06/24/2018 2:33:03 PM      Labs Reviewed  SARS CORONAVIRUS 2 ([facility])  CBC  COMPREHENSIVE METABOLIC PANEL  PROTIME-INR   URINALYSIS, ROUTINE W REFLEX MICROSCOPIC    CT HEAD WO CONTRAST    (Results Pending)    Medications - No data to display   Procedures Critical Care Critical Care Documentation Critical care time provided by me (excluding procedures): 32 minutes  Condition necessitating critical care: Acute ischemic stroke  Components of critical care management: reviewing of prior records, laboratory and imaging interpretation, frequent re-examination and reassessment of vital signs, discussion with colleagues regarding management.    ED Course and Medical Decision Making  I have reviewed the triage vital signs and the nursing notes.  Pertinent labs & imaging results that were available during my care of the patient were reviewed by me and considered in my medical decision making (see below for details).  Concern for intermittent TIA symptoms for the past several days, concern for small stroke given patient's exam this morning, isolated left facial numbness, no appreciable weakness.  Last known normal 13 hours ago, Van negative, no indication for code stroke initiation, will obtain CT head, labs, likely consult neurology and admit for MRI and stroke work-up.  Awaiting CT head, once that is confirmed there is no acute hemorrhage will need neurology consultation, admission.  Signed out to oncoming provider at shift change.  Barth Kirks. Sedonia Small, [place] [email or web address]  Final Clinical Impressions(s) / ED Diagnoses     ICD-10-CM   1. Numbness R20.0   2. Acute ischemic stroke [facility]) I63.9     ED Discharge Orders    None         Maudie Flakes, MD 06/24/18 1441

## 2018-06-24 NOTE — Progress Notes (Signed)
Spoke with MD about speech eval. SLP has left for day and unable to evaluate pt. MD advised RN to provide crackers and liquid and if no issues to advance pt to a diet. Pt had no issues with swallowing, diet advanced. Will continue to monitor pt at this time.

## 2018-06-24 NOTE — ED Triage Notes (Signed)
Pt reports numbness on the left side of her face and decreased sensation on that same side on her arm. Pt has equal sensation on her legs. Pt states she went to bed feeling normal. Pt states she woke up at 0900 feeling this way.

## 2018-06-24 NOTE — ED Notes (Signed)
EDP at bedside  

## 2018-06-24 NOTE — ED Notes (Signed)
Unsuccessful IV attempt x2. Royce RN asked to attempt.

## 2018-06-24 NOTE — ED Notes (Signed)
Patient transported to CT 

## 2018-06-24 NOTE — ED Notes (Signed)
ED TO INPATIENT HANDOFF REPORT  Name/Age/Gender Leslie Walker 53 y.o. female  Code Status Code Status History    Date Active Date Inactive Code Status Order ID Comments User Context   02/17/2014 0232 02/18/2014 1936 Full Code [id number]  Berle Mull, MD Inpatient      Home/SNF/Other Home  Chief Complaint lt side facial numbness  Level of Care/Admitting Diagnosis ED Disposition    ED Disposition Condition [facility] Area: [place] [[id number]]  Level of Care: Telemetry [5]  Admit to tele based on following criteria: Other see comments  Comments: TIA  Covid Evaluation: Screening Protocol (No Symptoms)  Diagnosis: TIA (transient ischemic attack) [706237]  Admitting Physician: RAI, Vernelle Emerald [[id number]]  Attending Physician: RAI, RIPUDEEP K [[id number]]  PT Class (Do Not Modify): Observation [104]  PT Acc Code (Do Not Modify): Observation [10022]       Medical History Past Medical History:  Diagnosis Date  . Hypertension     Allergies No Known Allergies  IV Location/Drains/Wounds Patient Lines/Drains/Airways Status   Active Line/Drains/Airways    Name:   Placement date:   Placement time:   Site:   Days:   Peripheral IV 06/24/18 Left;Upper Arm   06/24/18    1414    Arm   less than 1   Wound / Incision (Open or Dehisced) 02/16/14 Other (Comment) Foot Right;Lateral Circular area where skin is not intact with scant drainage noted.   02/16/14    2119    Foot   1589          Labs/Imaging Results for orders placed or performed during the hospital encounter of 06/24/18 (from the past 48 hour(s))  SARS Coronavirus 2 (CEPHEID - Performed in [facility] hospital lab), Hosp Order     Status: None   Collection Time: 06/24/18  1:54 PM  Result Value Ref Range   SARS Coronavirus 2 NEGATIVE NEGATIVE    Comment: (NOTE) If result is NEGATIVE SARS-CoV-2 target nucleic acids are NOT DETECTED. The SARS-CoV-2 RNA is generally detectable in upper and lower   respiratory specimens during the acute phase of infection. The lowest  concentration of SARS-CoV-2 viral copies this assay can detect is 250  copies / mL. A negative result does not preclude SARS-CoV-2 infection  and should not be used as the sole basis for treatment or other  patient management decisions.  A negative result may occur with  improper specimen collection / handling, submission of specimen other  than nasopharyngeal swab, presence of viral mutation(s) within the  areas targeted by this assay, and inadequate number of viral copies  (<250 copies / mL). A negative result must be combined with clinical  observations, patient history, and epidemiological information. If result is POSITIVE SARS-CoV-2 target nucleic acids are DETECTED. The SARS-CoV-2 RNA is generally detectable in upper and lower  respiratory specimens dur ing the acute phase of infection.  Positive  results are indicative of active infection with SARS-CoV-2.  Clinical  correlation with patient history and other diagnostic information is  necessary to determine patient infection status.  Positive results do  not rule out bacterial infection or co-infection with other viruses. If result is PRESUMPTIVE POSTIVE SARS-CoV-2 nucleic acids MAY BE PRESENT.   A presumptive positive result was obtained on the submitted specimen  and confirmed on repeat testing.  While 2019 novel coronavirus  (SARS-CoV-2) nucleic acids may be present in the submitted sample  additional confirmatory testing may be necessary for epidemiological  and / or clinical management purposes  to differentiate between  SARS-CoV-2 and other Sarbecovirus currently known to infect humans.  If clinically indicated additional testing with an alternate test  methodology [phone]) is advised. The SARS-CoV-2 RNA is generally  detectable in upper and lower respiratory sp ecimens during the acute  phase of infection. The expected result is Negative. Fact  Sheet for Patients:  StrictlyIdeas.no Fact Sheet for Healthcare Providers: BankingDealers.co.za This test is not yet approved or cleared by the [place] FDA and has been authorized for detection and/or diagnosis of SARS-CoV-2 by FDA under an Emergency Use Authorization (EUA).  This EUA will remain in effect (meaning this test can be used) for the duration of the COVID-19 declaration under Section 564(b)(1) of the Act, 21 U.S.C. section 360bbb-3(b)(1), unless the authorization is terminated or revoked sooner. Performed at [facility], [place] [address] [place], [place] [address]   CBC     Status: None   Collection Time: 06/24/18  2:04 PM  Result Value Ref Range   WBC 7.0 4.0 - 10.5 K/uL   RBC 4.88 3.87 - 5.11 MIL/uL   Hemoglobin 14.4 12.0 - 15.0 g/dL   HCT 41.0 36.0 - 46.0 %   MCV 84.0 80.0 - 100.0 fL   MCH 29.5 26.0 - 34.0 pg   MCHC 35.1 30.0 - 36.0 g/dL   RDW 13.3 11.5 - 15.5 %   Platelets 227 150 - 400 K/uL   nRBC 0.0 0.0 - 0.2 %    Comment: Performed at [facility], [place] [address], [place], [place] [address]  Comprehensive metabolic panel     Status: Abnormal   Collection Time: 06/24/18  2:04 PM  Result Value Ref Range   Sodium 140 135 - 145 mmol/L   Potassium 3.9 3.5 - 5.1 mmol/L   Chloride 104 98 - 111 mmol/L   CO2 29 22 - 32 mmol/L   Glucose, Bld 108 (H) 70 - 99 mg/dL   BUN 12 6 - 20 mg/dL   Creatinine, Ser 0.79 0.44 - 1.00 mg/dL   Calcium 9.3 8.9 - 10.3 mg/dL   Total Protein 8.0 6.5 - 8.1 g/dL   Albumin 4.2 3.5 - 5.0 g/dL   AST 19 15 - 41 U/L   ALT 13 0 - 44 U/L   Alkaline Phosphatase 74 38 - 126 U/L   Total Bilirubin 0.7 0.3 - 1.2 mg/dL   GFR calc non Af Amer >60 >60 mL/min   GFR calc Af Amer >60 >60 mL/min   Anion gap 7 5 - 15    Comment: Performed at [facility], [place] [address], [place], [place] [address]  Protime-INR     Status: None   Collection  Time: 06/24/18  2:04 PM  Result Value Ref Range   Prothrombin Time 12.0 11.4 - 15.2 seconds   INR 0.9 0.8 - 1.2    Comment: (NOTE) INR goal varies based on device and disease states. Performed at [facility], [place] [address], [place], [place] [address]   Urinalysis, Routine w reflex microscopic     Status: Abnormal   Collection Time: 06/24/18  3:00 PM  Result Value Ref Range   Color, Urine YELLOW YELLOW   APPearance CLOUDY (A) CLEAR   Specific Gravity, Urine 1.017 1.005 - 1.030   pH 5.0 5.0 - 8.0   Glucose, UA NEGATIVE NEGATIVE mg/dL   Hgb urine dipstick NEGATIVE NEGATIVE   Bilirubin Urine NEGATIVE NEGATIVE  Ketones, ur NEGATIVE NEGATIVE mg/dL   Protein, ur NEGATIVE NEGATIVE mg/dL   Nitrite NEGATIVE NEGATIVE   Leukocytes,Ua MODERATE (A) NEGATIVE   RBC / HPF 11-20 0 - 5 RBC/hpf   WBC, UA 11-20 0 - 5 WBC/hpf   Bacteria, UA RARE (A) NONE SEEN   Squamous Epithelial / LPF 21-50 0 - 5   Mucus PRESENT     Comment: Performed at [facility], [place] [address] [place], [place] [address]   Ct Head Wo Contrast  Result Date: 06/24/2018 CLINICAL DATA:  Left hand and face numbness upon awakening EXAM: CT HEAD WITHOUT CONTRAST TECHNIQUE: Contiguous axial images were obtained from the base of the skull through the vertex without intravenous contrast. COMPARISON:  None. FINDINGS: Brain: No evidence of acute infarction, hemorrhage, hydrocephalus, extra-axial collection or mass lesion/mass effect. Probable mild white matter disease with patchy low-density in the cerebral white matter. History of hypertension suggests this is chronic microvascular ischemia. Normal brain volume Vascular: No hyperdense vessel. There is atherosclerotic calcification. Skull: Normal. Negative for fracture or focal lesion. Sinuses/Orbits: No acute finding. IMPRESSION: 1. No acute finding.  No visible infarct. 2. Probable mild chronic small vessel ischemia. Electronically Signed   By:  Monte Fantasia M.D.   On: 06/24/2018 15:10    Pending Labs [employer] (From admission, onward)    Start     Ordered   Signed and Held  HIV antibody (Routine Testing)  Once,   R     Signed and Held   Signed and Held  Hemoglobin A1c  Tomorrow morning,   R     Signed and Held   Signed and Held  Lipid panel  Tomorrow morning,   R    Comments:  Fasting    Signed and Held   Signed and Held  CBC  (enoxaparin (LOVENOX)    CrCl >/= 30 ml/min)  Once,   R    Comments:  Baseline for enoxaparin therapy IF NOT ALREADY DRAWN.  Notify MD if PLT < 100 K.    Signed and Held   Signed and Held  Creatinine, serum  (enoxaparin (LOVENOX)    CrCl >/= 30 ml/min)  Once,   R    Comments:  Baseline for enoxaparin therapy IF NOT ALREADY DRAWN.    Signed and Held   Signed and Held  Creatinine, serum  (enoxaparin (LOVENOX)    CrCl >/= 30 ml/min)  Weekly,   R    Comments:  while on enoxaparin therapy    Signed and Held          Vitals/Pain Today's Vitals   06/24/18 1511 06/24/18 1520 06/24/18 1530 06/24/18 1600  BP: 134/81 (!) 163/91 (!) 156/82 126/74  Pulse: 68 85 68 65  Resp: 11 (!) 21 13 18   Temp:      TempSrc:      SpO2: 99% 98% 98% 98%  Weight:      Height:      PainSc:        Isolation Precautions No active isolations  Medications Medications - No data to display  Mobility walks

## 2018-06-24 NOTE — ED Notes (Signed)
Urine culture sent down to lab with urinalysis. 

## 2018-06-25 ENCOUNTER — Observation Stay (HOSPITAL_COMMUNITY): Payer: Self-pay

## 2018-06-25 DIAGNOSIS — G459 Transient cerebral ischemic attack, unspecified: Secondary | ICD-10-CM

## 2018-06-25 DIAGNOSIS — I639 Cerebral infarction, unspecified: Secondary | ICD-10-CM

## 2018-06-25 DIAGNOSIS — I10 Essential (primary) hypertension: Secondary | ICD-10-CM

## 2018-06-25 LAB — LIPID PANEL
Cholesterol: 201 mg/dL — ABNORMAL HIGH (ref 0–200)
HDL: 46 mg/dL (ref >40)
LDL Cholesterol: 127 mg/dL — ABNORMAL HIGH (ref 0–99)
Total CHOL/HDL Ratio: 4.4 RATIO
Triglycerides: 141 mg/dL (ref <150)
VLDL: 28 mg/dL (ref 0–40)

## 2018-06-25 LAB — HIV ANTIBODY (ROUTINE TESTING W REFLEX): HIV Screen 4th Generation wRfx: NONREACTIVE

## 2018-06-25 LAB — ECHOCARDIOGRAM COMPLETE
Height: 64 in
Weight: 2464 oz

## 2018-06-25 LAB — HEMOGLOBIN A1C
Hgb A1c MFr Bld: 5.7 % — ABNORMAL HIGH (ref 4.8–5.6)
Mean Plasma Glucose: 116.89 mg/dL

## 2018-06-25 MED ORDER — ATORVASTATIN CALCIUM 40 MG PO TABS
40.0000 mg | ORAL_TABLET | Freq: Every day | ORAL | Status: DC
  Administered 2018-06-25 – 2018-06-29 (×5): 40 mg via ORAL
  Filled 2018-06-25 (×5): qty 1

## 2018-06-25 MED ORDER — ONDANSETRON HCL 4 MG/2ML IJ SOLN
4.0000 mg | Freq: Four times a day (QID) | INTRAMUSCULAR | Status: DC | PRN
  Administered 2018-06-25 – 2018-06-26 (×2): 4 mg via INTRAVENOUS
  Filled 2018-06-25 (×2): qty 2

## 2018-06-25 MED ORDER — PROCHLORPERAZINE EDISYLATE 10 MG/2ML IJ SOLN
10.0000 mg | Freq: Four times a day (QID) | INTRAMUSCULAR | Status: DC | PRN
  Administered 2018-06-25 – 2018-06-26 (×2): 10 mg via INTRAVENOUS
  Filled 2018-06-25 (×3): qty 2

## 2018-06-25 NOTE — Progress Notes (Signed)
VASCULAR LAB PRELIMINARY  PRELIMINARY  PRELIMINARY  PRELIMINARY  Carotid duplex completed.    Preliminary report:  See CV proc for preliminary results.   Teri Legacy, RVT 06/25/2018, 1:44 PM

## 2018-06-25 NOTE — Evaluation (Signed)
Speech Language Pathology Evaluation Patient Details Name: Leslie Walker MRN: [id number] DOB: 1965/09/05 Today's Date: 06/25/2018 Time: 1215-1300 SLP Time Calculation (min) (ACUTE ONLY): 45 min  Problem List:  Patient Active Problem List   Diagnosis Date Noted  . TIA (transient ischemic attack) 06/24/2018  . Nail fungus 04/20/2015  . Porokeratosis 04/20/2015  . History of ulceration 04/20/2015  . Foot ulcer ([place]) 02/17/2014  . Cellulitis of foot, right 02/17/2014  . Chronic pain syndrome 02/17/2014  . Essential hypertension 02/17/2014   Past Medical History:  Past Medical History:  Diagnosis Date  . Hypertension    Past Surgical History: History reviewed. No pertinent surgical history. HPI:  53 yo female adm to [facility] with left hand and facial weakness/tingling. MRI showed right frontal lobe pna.  PMH + for essential HTN.      Assessment / Plan / Recommendation Clinical Impression  MOCA 7.1 given to pt with her scoring 25/30 with normal being 26/30.  She demonstrated difficulty with trail task and abstract thought. but demonstrated strength in other areas  Pt with left lower facial asymmetry but sensation intact.  No dysarthria or dysphasia noted and pt is right handed.  Provided her with memory strategies that would be helpful.  No SlP follow up.      SLP Assessment  SLP Recommendation/Assessment: Patient does not need any further Speech Lanaguage Pathology Services SLP Visit Diagnosis: Dysarthria and anarthria (R47.1)    Follow Up Recommendations  None    Frequency and Duration           SLP Evaluation Cognition  Overall Cognitive Status: No family/caregiver present to determine baseline cognitive functioning Arousal/Alertness: Awake/alert Orientation Level: Oriented X4 Attention: Sustained Sustained Attention: Appears intact Memory: Appears intact(needed category cue to recall 1/5 words) Awareness: Appears intact Problem Solving: Appears intact Behaviors: Other  (comment)(? mildly flat affect, ? if baseline) Safety/Judgment: Appears intact       Comprehension  Auditory Comprehension Overall Auditory Comprehension: Appears within functional limits for tasks assessed Yes/No Questions: Not tested Commands: Within Functional Limits Conversation: Complex Visual Recognition/Discrimination Discrimination: Within Function Limits Reading Comprehension Reading Status: (read signs in the room)    Expression Expression Primary Mode of Expression: Verbal Verbal Expression Initiation: No impairment Repetition: No impairment Naming: Impairment Divergent: (named 8 words in 60 seconds, 11 normal) Other Naming Comments: named 2/3 pictures correctly Written Expression Dominant Hand: Right Written Expression: (drew clock wfl)   Oral / Motor  Oral Motor/Sensory Function Overall Oral Motor/Sensory Function: Within functional limits Motor Speech Overall Motor Speech: Appears within functional limits for tasks assessed Respiration: Within functional limits Phonation: Normal Resonance: Within functional limits Articulation: Within functional limitis Intelligibility: Intelligible Motor Planning: Witnin functional limits Motor Speech Errors: Not applicable Interfering Components: Premorbid status   GO                    Macario Golds 06/25/2018, 1:39 PM  Luanna Salk, [facility] SLP Acute Rehab Services Pager [phone] Office [phone]

## 2018-06-25 NOTE — Consult Note (Signed)
Referring Physician: Dr. Tawanna Solo    Chief Complaint: Left facial numbness with LUE numbness and weakness.   HPI: Leslie Walker is an 53 y.o. female who presented on 6/7 with left facial numbness in addition to LUE numbness and weakness. She has a history of HTN. Her LKN was Saturday night. On awakening Sunday morning, she felt numb to the left side of her face and LUE. Her hand felt clumsy and she was dropping objects. She denied visual changes, speech changes or any other symptoms of weakness.   CT head was negative for acute stroke. However, MRI brain revealed a small acute infarction in the posterior right frontal lobe.    She has no history of stroke and does not take ASA. She has been started on ASA and Lipitor this admission.   MRI/MRA brain:  1. 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 2. Mild to moderate white matter disease is likely chronic small vessel ischemia. 3. No emergent finding on intracranial MRA. There is mild atherosclerosis best seen in the posterior cerebral arteries.  Carotid ultrasound preliminary findings:  Right Carotid: The extracranial vessels were near-normal with only minimal wall thickening or plaque. Left Carotid: The extracranial vessels were near-normal with only minimal wall thickening or plaque. Vertebrals:  Bilateral vertebral arteries demonstrate antegrade flow. Subclavians: Normal flow hemodynamics were seen in bilateral subclavians  Echocardiogram: 1. The left ventricle has normal systolic function, with an ejection fraction of 55-60%. The cavity size was normal. Left ventricular diastolic Doppler parameters are consistent with impaired relaxation. Basal inferior hypokinesis.  2. The right ventricle has normal systolic function. The cavity was normal. There is no increase in right ventricular wall thickness.  3. No evidence of mitral valve stenosis. Trivial mitral regurgitation.  4. The aortic valve is tricuspid. Moderate  calcification of the aortic valve. Aortic valve regurgitation is moderate to severe by color flow Doppler. Mild stenosis of the aortic valve, mean gradient 16 mmHg with AVA 1.72 cm^2. Suspect most likely  nodular calcification of the aortic valve, but with history of TIA/CVA, would consider TEE to assess aortic insufficiency and rule out aortic valve vegetation.  5. The aortic root and ascending aorta are normal in size and structure.  6. The IVC was normal in size. No complete TR doppler jet so unable to estimate PA systolic pressure.  7. Negative bubble study, no evidence for ASD or PFO.  EKG: Sinus rhythm RAE, consider biatrial enlargement ST elev, probable normal early repol pattern                Past Medical History:  Diagnosis Date  . Hypertension     History reviewed. No pertinent surgical history.  Family History  Problem Relation Age of Onset  . Hypertension Mother   . Hypertension Father   . Hypertension Brother    Social History:  reports that she has been smoking cigars. She has never used smokeless tobacco. She reports that she does not drink alcohol or use drugs.  Allergies: No Known Allergies  Medications:  Prior to Admission:  Medications Prior to Admission  Medication Sig Dispense Refill Last Dose  . cholecalciferol (VITAMIN D) 400 units TABS tablet Take 400 Units by mouth.   06/24/2018 at Unknown time  . lisinopril-hydrochlorothiazide (ZESTORETIC) 20-12.5 MG tablet Take 1 tablet by mouth daily.   06/24/2018 at 9:30am  . meloxicam (MOBIC) 7.5 MG tablet Take 7.5 mg by mouth daily.   06/24/2018 at Unknown time  . metoprolol tartrate (LOPRESSOR)  25 MG tablet Take 25 mg by mouth 2 (two) times daily.   06/24/2018 at Unknown time  . ciclopirox (PENLAC) 8 % solution Apply topically at bedtime. Apply over nail and surrounding skin. Apply daily over previous coat. After seven (7) days, may remove with alcohol and continue cycle. (Patient not taking: Reported on 06/24/2018) 6.6 mL  4 Not Taking at Unknown time  . HYDROcodone-acetaminophen (NORCO/VICODIN) 5-325 MG per tablet Take 1 tablet by mouth every 6 (six) hours as needed for moderate pain. (Patient not taking: Reported on 10/30/2015) 20 tablet 0 Not Taking  . sulfamethoxazole-trimethoprim (BACTRIM) 400-80 MG per tablet Take 1 tablet by mouth 2 (two) times daily. (Patient not taking: Reported on 10/30/2015) 14 tablet 0 Not Taking   Scheduled: . aspirin  300 mg Rectal Daily   Or  . aspirin  325 mg Oral Daily  . atorvastatin  40 mg Oral q1800  . enoxaparin (LOVENOX) injection  40 mg Subcutaneous Q24H    ROS: As per HPI. All other systems negative.   Physical Examination: Blood pressure (!) 106/57, pulse 62, temperature 98.6 F (37 C), temperature source Oral, resp. rate 16, height 5\' 4"  (1.626 m), weight 69.9 kg, SpO2 97 %.  HEENT: [place]/AT Lungs: Respirations unlabored Ext: No edema  Neurologic Examination: Mental Status: Alert, oriented, thought content appropriate.  Speech fluent without evidence of aphasia.  Able to follow all commands without difficulty. Cranial Nerves: II:  Visual fields intact. PERRL. III,IV, VI: Ptosis not present, EOMI. No nystagmus.  V,VII: No facial droop. Temp sensation equal bilaterally  VIII: hearing intact to voice IX,X: No hypophonia XI: Symmetric XII: midline tongue extension Motor: BUE and BLE 5/5 except for 4/5 right finger abduction and extension. Some incoordination on the left with rotating fingers test.  Sensory: Temp and light touch intact throughout, bilaterally. No extinction.  Deep Tendon Reflexes:  Normoactive x 4 with no asymmetry.  Cerebellar: No ataxia with FNF bilaterally, but with mild dysmetria on the left  Gait: Deferred  Results for orders placed or performed during the hospital encounter of 06/24/18 (from the past 48 hour(s))  SARS Coronavirus 2 (CEPHEID - Performed in [facility] hospital lab), Hosp Order     Status: None   Collection Time: 06/24/18   1:54 PM  Result Value Ref Range   SARS Coronavirus 2 NEGATIVE NEGATIVE    Comment: (NOTE) If result is NEGATIVE SARS-CoV-2 target nucleic acids are NOT DETECTED. The SARS-CoV-2 RNA is generally detectable in upper and lower  respiratory specimens during the acute phase of infection. The lowest  concentration of SARS-CoV-2 viral copies this assay can detect is 250  copies / mL. A negative result does not preclude SARS-CoV-2 infection  and should not be used as the sole basis for treatment or other  patient management decisions.  A negative result may occur with  improper specimen collection / handling, submission of specimen other  than nasopharyngeal swab, presence of viral mutation(s) within the  areas targeted by this assay, and inadequate number of viral copies  (<250 copies / mL). A negative result must be combined with clinical  observations, patient history, and epidemiological information. If result is POSITIVE SARS-CoV-2 target nucleic acids are DETECTED. The SARS-CoV-2 RNA is generally detectable in upper and lower  respiratory specimens dur ing the acute phase of infection.  Positive  results are indicative of active infection with SARS-CoV-2.  Clinical  correlation with patient history and other diagnostic information is  necessary to determine patient infection  status.  Positive results do  not rule out bacterial infection or co-infection with other viruses. If result is PRESUMPTIVE POSTIVE SARS-CoV-2 nucleic acids MAY BE PRESENT.   A presumptive positive result was obtained on the submitted specimen  and confirmed on repeat testing.  While 2019 novel coronavirus  (SARS-CoV-2) nucleic acids may be present in the submitted sample  additional confirmatory testing may be necessary for epidemiological  and / or clinical management purposes  to differentiate between  SARS-CoV-2 and other Sarbecovirus currently known to infect humans.  If clinically indicated additional  testing with an alternate test  methodology [phone]) is advised. The SARS-CoV-2 RNA is generally  detectable in upper and lower respiratory sp ecimens during the acute  phase of infection. The expected result is Negative. Fact Sheet for Patients:  StrictlyIdeas.no Fact Sheet for Healthcare Providers: BankingDealers.co.za This test is not yet approved or cleared by the [place] FDA and has been authorized for detection and/or diagnosis of SARS-CoV-2 by FDA under an Emergency Use Authorization (EUA).  This EUA will remain in effect (meaning this test can be used) for the duration of the COVID-19 declaration under Section 564(b)(1) of the Act, 21 U.S.C. section 360bbb-3(b)(1), unless the authorization is terminated or revoked sooner. Performed at [facility], [place] [address], [place], [place] [address]   CBC     Status: None   Collection Time: 06/24/18  2:04 PM  Result Value Ref Range   WBC 7.0 4.0 - 10.5 K/uL   RBC 4.88 3.87 - 5.11 MIL/uL   Hemoglobin 14.4 12.0 - 15.0 g/dL   HCT 41.0 36.0 - 46.0 %   MCV 84.0 80.0 - 100.0 fL   MCH 29.5 26.0 - 34.0 pg   MCHC 35.1 30.0 - 36.0 g/dL   RDW 13.3 11.5 - 15.5 %   Platelets 227 150 - 400 K/uL   nRBC 0.0 0.0 - 0.2 %    Comment: Performed at [facility], [place] [address] [place], [place] [address]  Comprehensive metabolic panel     Status: Abnormal   Collection Time: 06/24/18  2:04 PM  Result Value Ref Range   Sodium 140 135 - 145 mmol/L   Potassium 3.9 3.5 - 5.1 mmol/L   Chloride 104 98 - 111 mmol/L   CO2 29 22 - 32 mmol/L   Glucose, Bld 108 (H) 70 - 99 mg/dL   BUN 12 6 - 20 mg/dL   Creatinine, Ser 0.79 0.44 - 1.00 mg/dL   Calcium 9.3 8.9 - 10.3 mg/dL   Total Protein 8.0 6.5 - 8.1 g/dL   Albumin 4.2 3.5 - 5.0 g/dL   AST 19 15 - 41 U/L   ALT 13 0 - 44 U/L   Alkaline Phosphatase 74 38 - 126 U/L   Total Bilirubin 0.7 0.3 - 1.2 mg/dL   GFR calc  non Af Amer >60 >60 mL/min   GFR calc Af Amer >60 >60 mL/min   Anion gap 7 5 - 15    Comment: Performed at [facility], [place] [address] [place], [place] [address]  Protime-INR     Status: None   Collection Time: 06/24/18  2:04 PM  Result Value Ref Range   Prothrombin Time 12.0 11.4 - 15.2 seconds   INR 0.9 0.8 - 1.2    Comment: (NOTE) INR goal varies based on device and disease states. Performed at [facility], [place] [address] [place], [place] [address]   Urinalysis, Routine  w reflex microscopic     Status: Abnormal   Collection Time: 06/24/18  3:00 PM  Result Value Ref Range   Color, Urine YELLOW YELLOW   APPearance CLOUDY (A) CLEAR   Specific Gravity, Urine 1.017 1.005 - 1.030   pH 5.0 5.0 - 8.0   Glucose, UA NEGATIVE NEGATIVE mg/dL   Hgb urine dipstick NEGATIVE NEGATIVE   Bilirubin Urine NEGATIVE NEGATIVE   Ketones, ur NEGATIVE NEGATIVE mg/dL   Protein, ur NEGATIVE NEGATIVE mg/dL   Nitrite NEGATIVE NEGATIVE   Leukocytes,Ua MODERATE (A) NEGATIVE   RBC / HPF 11-20 0 - 5 RBC/hpf   WBC, UA 11-20 0 - 5 WBC/hpf   Bacteria, UA RARE (A) NONE SEEN   Squamous Epithelial / LPF 21-50 0 - 5   Mucus PRESENT     Comment: Performed at [facility], [place] [address] [place], [place] [address]  HIV antibody (Routine Testing)     Status: None   Collection Time: 06/25/18  3:55 AM  Result Value Ref Range   HIV Screen 4th Generation wRfx Non Reactive Non Reactive    Comment: (NOTE) Performed At: [facility] [place], [place] [id number] Rush Farmer MD [id number]   Hemoglobin A1c     Status: Abnormal   Collection Time: 06/25/18  3:55 AM  Result Value Ref Range   Hgb A1c MFr Bld 5.7 (H) 4.8 - 5.6 %    Comment: (NOTE) Pre diabetes:          5.7%-6.4% Diabetes:              >6.4% Glycemic control for   <7.0% adults with diabetes    Mean Plasma Glucose 116.89 mg/dL    Comment: Performed at [facility] [address], [place], [place] [address]  Lipid panel     Status: Abnormal   Collection Time: 06/25/18  3:55 AM  Result Value Ref Range   Cholesterol 201 (H) 0 - 200 mg/dL   Triglycerides 141 <150 mg/dL   HDL 46 >40 mg/dL   Total CHOL/HDL Ratio 4.4 RATIO   VLDL 28 0 - 40 mg/dL   LDL Cholesterol 127 (H) 0 - 99 mg/dL    Comment:        Total Cholesterol/HDL:CHD Risk Coronary Heart Disease Risk Table                     Men   Women  1/2 Average Risk   3.4   3.3  Average Risk       5.0   4.4  2 X Average Risk   9.6   7.1  3 X Average Risk  23.4   11.0        Use the calculated Patient Ratio above and the CHD Risk Table to determine the patient's CHD Risk.        ATP III CLASSIFICATION (LDL):  <100     mg/dL   Optimal  100-129  mg/dL   Near or Above                    Optimal  130-159  mg/dL   Borderline  160-189  mg/dL   High  >190     mg/dL   Very High Performed at [facility] [address] [place], [place] [address]    Ct Head Wo Contrast  Result Date: 06/24/2018 CLINICAL DATA:  Left hand and face numbness upon awakening EXAM:  CT HEAD WITHOUT CONTRAST TECHNIQUE: Contiguous axial images were obtained from the base of the skull through the vertex without intravenous contrast. COMPARISON:  None. FINDINGS: Brain: No evidence of acute infarction, hemorrhage, hydrocephalus, extra-axial collection or mass lesion/mass effect. Probable mild white matter disease with patchy low-density in the cerebral white matter. History of hypertension suggests this is chronic microvascular ischemia. Normal brain volume Vascular: No hyperdense vessel. There is atherosclerotic calcification. Skull: Normal. Negative for fracture or focal lesion. Sinuses/Orbits: No acute finding. IMPRESSION: 1. No acute finding.  No visible infarct. 2. Probable mild chronic small vessel ischemia. Electronically Signed   By: Monte Fantasia M.D.   On: 06/24/2018 15:10   Mr Jodene Nam Head Wo  Contrast  Result Date: 06/24/2018 CLINICAL DATA:  Left hand and mouth numbness that started yesterday EXAM: MRI HEAD WITHOUT CONTRAST MRA HEAD WITHOUT CONTRAST TECHNIQUE: Multiplanar, multiecho pulse sequences of the brain and surrounding structures were obtained without intravenous contrast. Angiographic images of the head were obtained using MRA technique without contrast. 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 Mild to moderate for age white matter disease, likely chronic small vessel ischemia given history of hypertension. Brain volume is normal. No acute hemorrhage, hydrocephalus, masslike finding, or collection. Vascular: Arterial findings below. Skull and upper cervical spine: Negative for marrow lesion Sinuses/Orbits: Minor mucosal thickening in paranasal sinuses. MRA HEAD FINDINGS Symmetric carotid arteries and branching. No proximal flow limiting stenosis or branch occlusion. The vertebral and basilar arteries are smooth and widely patent. Fetal type PCA anatomy on the right. Negative for aneurysm or branch occlusion. There is mild atherosclerotic irregularity of bilateral PCA branches. There is asymmetric blurring of proximal right MCA due to artifact, asymmetric due to head tilt. IMPRESSION: 1. 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 2. Mild to moderate white matter disease is likely chronic small vessel ischemia. 3. No emergent finding on intracranial MRA. There is mild atherosclerosis best seen in the posterior cerebral arteries. Electronically Signed   By: Monte Fantasia M.D.   On: 06/24/2018 18:22   Mr Brain Wo Contrast (neuro Protocol)  Result Date: 06/24/2018 CLINICAL DATA:  Left hand and mouth numbness that started yesterday EXAM: MRI HEAD WITHOUT CONTRAST MRA HEAD WITHOUT CONTRAST TECHNIQUE: Multiplanar, multiecho pulse sequences of the brain and surrounding  structures were obtained without intravenous contrast. Angiographic images of the head were obtained using MRA technique without contrast. 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 Mild to moderate for age white matter disease, likely chronic small vessel ischemia given history of hypertension. Brain volume is normal. No acute hemorrhage, hydrocephalus, masslike finding, or collection. Vascular: Arterial findings below. Skull and upper cervical spine: Negative for marrow lesion Sinuses/Orbits: Minor mucosal thickening in paranasal sinuses. MRA HEAD FINDINGS Symmetric carotid arteries and branching. No proximal flow limiting stenosis or branch occlusion. The vertebral and basilar arteries are smooth and widely patent. Fetal type PCA anatomy on the right. Negative for aneurysm or branch occlusion. There is mild atherosclerotic irregularity of bilateral PCA branches. There is asymmetric blurring of proximal right MCA due to artifact, asymmetric due to head tilt. IMPRESSION: 1. 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 2. Mild to moderate white matter disease is likely chronic small vessel ischemia. 3. No emergent finding on intracranial MRA. There  is mild atherosclerosis best seen in the posterior cerebral arteries. Electronically Signed   By: Monte Fantasia M.D.   On: 06/24/2018 18:22   Vas US Carotid (at [place] Only)  Result Date: 06/25/2018 Carotid Arterial Duplex Study Indications:       CVA and Numbness. Risk Factors:      Hypertension. Comparison Study:  No prior study on file for comparison. Performing Technologist: Sharion Dove RVS  Examination Guidelines: A complete evaluation includes B-mode imaging, spectral Doppler, color Doppler, and power Doppler as needed of all accessible portions of each vessel. Bilateral testing is considered an integral part of a complete  examination. Limited examinations for reoccurring indications may be performed as noted.  Right Carotid Findings: +----------+--------+--------+--------+--------+------------------+           PSV cm/sEDV cm/sStenosisDescribeComments           +----------+--------+--------+--------+--------+------------------+ CCA Prox  78      16                      intimal thickening +----------+--------+--------+--------+--------+------------------+ CCA Distal74      18                      intimal thickening +----------+--------+--------+--------+--------+------------------+ ICA Prox  58      18                                         +----------+--------+--------+--------+--------+------------------+ ICA Distal76      15                                         +----------+--------+--------+--------+--------+------------------+ ECA       212     24              calcific                   +----------+--------+--------+--------+--------+------------------+ +----------+--------+-------+--------+-------------------+           PSV cm/sEDV cmsDescribeArm Pressure (mmHG) +----------+--------+-------+--------+-------------------+ Subclavian114                                        +----------+--------+-------+--------+-------------------+ +---------+--------+--+--------+--+ VertebralPSV cm/s57EDV cm/s14 +---------+--------+--+--------+--+  Left Carotid Findings: +----------+--------+--------+--------+------------+------------------+           PSV cm/sEDV cm/sStenosisDescribe    Comments           +----------+--------+--------+--------+------------+------------------+ CCA Prox  64      17                          intimal thickening +----------+--------+--------+--------+------------+------------------+ CCA Distal69      19                          intimal thickening +----------+--------+--------+--------+------------+------------------+ ICA Prox  75       18              heterogenous                   +----------+--------+--------+--------+------------+------------------+ ICA Distal97      20                                             +----------+--------+--------+--------+------------+------------------+  ECA       85      14                                             +----------+--------+--------+--------+------------+------------------+ +----------+--------+--------+--------+-------------------+ SubclavianPSV cm/sEDV cm/sDescribeArm Pressure (mmHG) +----------+--------+--------+--------+-------------------+           95                                          +----------+--------+--------+--------+-------------------+ +---------+--------+--+--------+--+ VertebralPSV cm/s71EDV cm/s16 +---------+--------+--+--------+--+  Summary: Right Carotid: The extracranial vessels were near-normal with only minimal wall                thickening or plaque. Left Carotid: The extracranial vessels were near-normal with only minimal wall               thickening or plaque. Vertebrals:  Bilateral vertebral arteries demonstrate antegrade flow. Subclavians: Normal flow hemodynamics were seen in bilateral subclavian              arteries. *See table(s) above for measurements and observations.     Preliminary     Assessment: 53 y.o. female presenting with left hand weakness and clumsiness 1. Exam reveals mild weakness of finger abduction and extension on the left.  2. MRI reveals a small acute infarct in the posterior right frontal lobe. Mild to moderate white matter disease is likely chronic small vessel ischemia.  3. MRA reveals mild atherosclerosis, best seen in the posterior cerebral arteries.  4. TTE with no mural thrombus.  5. Carotid ultrasound: Minimal wall thickening/plaque of bilateral ICAs  6. No a-fib on EKG 7. Stroke Risk Factors - HTN and cigar smoking 8. Mildly elevated HgbA1c 9.   Plan: 1.PT consult, OT consult,  Speech consult 2. Has been started on ASA and atorvastatin 3. Smoking cessation counseling 4. Telemetry monitoring 5. Frequent neuro checks    @Electronically  signed: Dr. Kerney Elbe   06/25/2018, 7:18 PM

## 2018-06-25 NOTE — Evaluation (Signed)
Physical Therapy One Time Evaluation and Discharge Patient Details Name: Leslie Walker MRN: [id number] DOB: 05/31/1965 Today's Date: 06/25/2018   History of Present Illness  53 year old female with history of hypertension presented to ED with left facial numbness and left arm, hand numbness and weakness.  MRI head: "Small acute infarct in the posterior right frontal lobe"  Clinical Impression  Patient evaluated by Physical Therapy with no further acute PT needs identified. All education has been completed and the patient has no further questions.  Pt denies LE symptoms and none observed during session.  Pt reports numbness and weakness in L hand as well as some facial drooping prior to admission that "comes and goes."  No LE symptoms or balance deficits observed.  See below for any follow-up Physical Therapy or equipment needs. PT is signing off. Thank you for this referral.     Follow Up Recommendations No PT follow up    Equipment Recommendations  None recommended by PT    Recommendations for Other Services       Precautions / Restrictions Precautions Precautions: Fall Restrictions Weight Bearing Restrictions: No      Mobility  Bed Mobility Overal bed mobility: Modified Independent                Transfers Overall transfer level: Needs assistance Equipment used: None Transfers: Sit to/from Stand Sit to Stand: Supervision         General transfer comment: supervision  Ambulation/Gait Ambulation/Gait assistance: Min guard;Supervision Gait Distance (Feet): 160 Feet Assistive device: None Gait Pattern/deviations: WFL(Within Functional Limits)     General Gait Details: no deviations or LOB observed  Stairs            Wheelchair Mobility    Modified Rankin (Stroke Patients Only)       Balance Overall balance assessment: No apparent balance deficits (not formally assessed)                                           Pertinent  Vitals/Pain Pain Assessment: No/denies pain    Home Living Family/patient expects to be discharged to:: Private residence Living Arrangements: Alone             Home Equipment: None      Prior Function Level of Independence: Independent               Hand Dominance        Extremity/Trunk Assessment   Upper Extremity Assessment Upper Extremity Assessment: LUE deficits/detail LUE Deficits / Details: pt reports numbness in L hand however improved since admission, L grip weaker then R    Lower Extremity Assessment Lower Extremity Assessment: Overall WFL for tasks assessed    Cervical / Trunk Assessment Cervical / Trunk Assessment: Normal  Communication   Communication: Expressive difficulties(slight dysarthria at times)  Cognition Arousal/Alertness: Awake/alert Behavior During Therapy: WFL for tasks assessed/performed Overall Cognitive Status: No family/caregiver present to determine baseline cognitive functioning                                 General Comments: reports wanting to "walk out of here" however returned to bed/rest      General Comments      Exercises     Assessment/Plan    PT Assessment Patent does not need any further PT services  PT Problem List         PT Treatment Interventions      PT Goals (Current goals can be found in the Care Plan section)  Acute Rehab PT Goals PT Goal Formulation: All assessment and education complete, DC therapy    Frequency     Barriers to discharge        Co-evaluation               AM-PAC PT "6 Clicks" Mobility  Outcome Measure Help needed turning from your back to your side while in a flat bed without using bedrails?: None Help needed moving from lying on your back to sitting on the side of a flat bed without using bedrails?: None Help needed moving to and from a bed to a chair (including a wheelchair)?: None Help needed standing up from a chair using your arms (e.g.,  wheelchair or bedside chair)?: None Help needed to walk in hospital room?: A Little Help needed climbing 3-5 steps with a railing? : A Little 6 Click Score: 22    End of Session   Activity Tolerance: Patient tolerated treatment well Patient left: in bed;with call bell/phone within reach;with bed alarm set Nurse Communication: Mobility status PT Visit Diagnosis: Difficulty in walking, not elsewhere classified (R26.2)    Time: 1101-1109 PT Time Calculation (min) (ACUTE ONLY): 8 min   Charges:   PT Evaluation $PT Eval Low Complexity: Vickery, PT, DPT Acute Rehabilitation Services Office: [phone] Pager: [phone]  Trena Platt 06/25/2018, 12:27 PM

## 2018-06-25 NOTE — Evaluation (Addendum)
Occupational Therapy Evaluation Patient Details Name: Leslie Walker MRN: [id number] DOB: 1965-05-22 Today's Date: 06/25/2018    History of Present Illness 53 year old female with history of hypertension presented to ED with left facial numbness and left arm, hand numbness and weakness.  MRI head: "Small acute infarct in the posterior right frontal lobe"   Clinical Impression   OT eval complete.  Pt overall S- mod I with ADL activity.  Pt states she has A at home.  Pt reports feeling nauseous and tired this afternoon.  OT will sign off at this time    Follow Up Recommendations  Supervision - Intermittent    Equipment Recommendations  None recommended by OT    Recommendations for Other Services       Precautions / Restrictions Precautions Precautions: Fall      Mobility Bed Mobility Overal bed mobility: Modified Independent                Transfers Overall transfer level: Needs assistance Equipment used: None Transfers: Sit to/from Stand Sit to Stand: Supervision         General transfer comment: supervision    Balance Overall balance assessment: No apparent balance deficits (not formally assessed)                                         ADL either performed or assessed with clinical judgement   ADL Overall ADL's : Modified independent                                             Vision Patient Visual Report: No change from baseline              Pertinent Vitals/Pain Pain Assessment: No/denies pain     Hand Dominance Right   Extremity/Trunk Assessment Upper Extremity Assessment Upper Extremity Assessment: Generalized weakness LUE Deficits / Details: pt told OT that numbness is better at this time.  Pts grips seemed equal upon OT eval   Lower Extremity Assessment Lower Extremity Assessment: Overall WFL for tasks assessed   Cervical / Trunk Assessment Cervical / Trunk Assessment: Normal   Communication  Communication Communication: Expressive difficulties(slight dysarthria at times)   Cognition Arousal/Alertness: Awake/alert Behavior During Therapy: WFL for tasks assessed/performed Overall Cognitive Status: Within Functional Limits for tasks assessed                                                Home Living Family/patient expects to be discharged to:: Private residence Living Arrangements: Alone Available Help at Discharge: Family Type of Home: House       Home Layout: One level     Bathroom Shower/Tub: Occupational psychologist: Standard     Home Equipment: None          Prior Functioning/Environment Level of Independence: Independent                          OT Goals(Current goals can be found in the care plan section) Acute Rehab OT Goals Patient Stated Goal: home OT Goal Formulation: With patient  OT Frequency:  AM-PAC OT "6 Clicks" Daily Activity     Outcome Measure Help from another person eating meals?: None Help from another person taking care of personal grooming?: None Help from another person toileting, which includes using toliet, bedpan, or urinal?: None Help from another person bathing (including washing, rinsing, drying)?: None Help from another person to put on and taking off regular upper body clothing?: None Help from another person to put on and taking off regular lower body clothing?: None 6 Click Score: 24   End of Session Nurse Communication: Mobility status  Activity Tolerance: Patient limited by lethargy Patient left: in bed                   Time: 1340-1358 OT Time Calculation (min): 18 min Charges:  OT General Charges $OT Visit: 1 Visit OT Evaluation $OT Eval Moderate Complexity: 1 Mod  Kari Baars, OT Acute Rehabilitation Services [phone] Office- [place], Edwena Felty D 06/25/2018, 4:05 PM

## 2018-06-25 NOTE — Progress Notes (Addendum)
PROGRESS NOTE    Leslie Walker  JOA:[id number] DOB: 1965/09/10 DOA: 06/24/2018 PCP: Elbert Ewings, FNP   Brief Narrative: Patient is a 53 year old female with history of hypertension who presented to the emergency department yesterday with complaints of left facial numbness, left arm, hand numbness .  CT head on presentation did not show any acute intracranial abnormalities.  MRI of the brain showed small acute infarct in posterior right frontal lobe.  MRA did not show any emergent findings.  Neurology consulted.  She is being  been transferred to [facility] today  Assessment & Plan:   Principal Problem:   TIA (transient ischemic attack) Active Problems:   Essential hypertension  Acute ischemic stroke: MRI finding as above.  Will initiate stroke protocol.  Speech/PT/OT consultation. Echocardiogram/carotid Dopplers have been ordered Hemoglobin A1c of 5.7.  LDL of 127.  Started on Lipitor 40 mg daily. Continue aspirin for now.  Further recommendation as per neurology. Patient does not have any focal neurological deficits at present.  Her previous symptoms of left-sided numbness have improved.  Aortic regurgitation: Echocardiogram showed normal systolic function with ejection fraction of 55 to 60%, impaired relaxation, basal inferior hypokinesis.  Also showed   moderate to severe aortic valve regurgitation, nodular calcification of the aortic valve.  Patient might need TEE for work up of  possible cryptogenic stroke.I have requested for cardiology evaluation.  Hypertension: On antihypertensives at home.  Allow permissive hypertension.  Continue PRN meds for blood pressure systolic more than 601 mmHg or diastolic more than 093 mmHg.  Gradually normalize blood pressure in 5-7 days.  Nausea: Continue Zofran           DVT prophylaxis: Lovenox Code Status: Full Family Communication: Communicated the patient. Disposition Plan: Likely home in 1 to 2 days   Consultants:  Neurology  Procedures: MRI of the brain  Antimicrobials:  Anti-infectives (From admission, onward)   None      Subjective: Patient seen and examined the bedside this morning.  Hemodynamically stable during my evaluation.  Was undergoing echocardiogram.  She was emotional when I  told her that she has a stroke.  She verbalized understanding of further treatment plan.  Objective: Vitals:   06/24/18 2315 06/25/18 0111 06/25/18 0615 06/25/18 1015  BP: (!) 181/71 (!) 157/82 (!) 157/99 (!) 156/80  Pulse: 62 61 71 61  Resp: 18 16 17 20   Temp:   97.6 F (36.4 C) 98.4 F (36.9 C)  TempSrc:   Oral   SpO2: 100% 100% 100% 100%  Weight:      Height:        Intake/Output Summary (Last 24 hours) at 06/25/2018 1202 Last data filed at 06/25/2018 0920 Gross per 24 hour  Intake 724.81 ml  Output 252 ml  Net 472.81 ml   Filed Weights   06/24/18 1332  Weight: 69.9 kg    Examination:  General exam: Appears calm and comfortable ,Not in distress,average built HEENT:PERRL,Oral mucosa moist, Ear/Nose normal on gross exam Respiratory system: Bilateral equal air entry, normal vesicular breath sounds, no wheezes or crackles  Cardiovascular system: S1 & S2 heard, RRR.Marland Kitchen No pedal edema. Gastrointestinal system: Abdomen is nondistended, soft and nontender. No organomegaly or masses felt. Normal bowel sounds heard. Central nervous system: Alert and oriented. No focal neurological deficits. Extremities: No edema, no clubbing ,no cyanosis, distal peripheral pulses palpable. Skin: No rashes, lesions or ulcers,no icterus ,no pallor MSK: Normal muscle bulk,tone ,power Psychiatry: Judgement and insight appear normal. Mood & affect appropriate.  Data Reviewed: I have personally reviewed following labs and imaging studies  CBC: Recent Labs  Lab 06/24/18 1404  WBC 7.0  HGB 14.4  HCT 41.0  MCV 84.0  PLT 952   Basic Metabolic Panel: Recent Labs  Lab 06/24/18 1404  NA 140  K 3.9  CL 104   CO2 29  GLUCOSE 108*  BUN 12  CREATININE 0.79  CALCIUM 9.3   GFR: Estimated Creatinine Clearance: 78.1 mL/min (by C-G formula based on SCr of 0.79 mg/dL). Liver Function Tests: Recent Labs  Lab 06/24/18 1404  AST 19  ALT 13  ALKPHOS 74  BILITOT 0.7  PROT 8.0  ALBUMIN 4.2   No results for input(s): LIPASE, AMYLASE in the last 168 hours. No results for input(s): AMMONIA in the last 168 hours. Coagulation Profile: Recent Labs  Lab 06/24/18 1404  INR 0.9   Cardiac Enzymes: No results for input(s): CKTOTAL, CKMB, CKMBINDEX, TROPONINI in the last 168 hours. BNP (last 3 results) No results for input(s): PROBNP in the last 8760 hours. HbA1C: Recent Labs    06/25/18 0355  HGBA1C 5.7*   CBG: No results for input(s): GLUCAP in the last 168 hours. Lipid Profile: Recent Labs    06/25/18 0355  CHOL 201*  HDL 46  LDLCALC 127*  TRIG 141  CHOLHDL 4.4   Thyroid Function Tests: No results for input(s): TSH, T4TOTAL, FREET4, T3FREE, THYROIDAB in the last 72 hours. Anemia Panel: No results for input(s): VITAMINB12, FOLATE, FERRITIN, TIBC, IRON, RETICCTPCT in the last 72 hours. Sepsis Labs: No results for input(s): PROCALCITON, LATICACIDVEN in the last 168 hours.  Recent Results (from the past 240 hour(s))  SARS Coronavirus 2 (CEPHEID - Performed in [facility] lab), Hosp Order     Status: None   Collection Time: 06/24/18  1:54 PM  Result Value Ref Range Status   SARS Coronavirus 2 NEGATIVE NEGATIVE Final    Comment: (NOTE) If result is NEGATIVE SARS-CoV-2 target nucleic acids are NOT DETECTED. The SARS-CoV-2 RNA is generally detectable in upper and lower  respiratory specimens during the acute phase of infection. The lowest  concentration of SARS-CoV-2 viral copies this assay can detect is 250  copies / mL. A negative result does not preclude SARS-CoV-2 infection  and should not be used as the sole basis for treatment or other  patient management decisions.   A negative result may occur with  improper specimen collection / handling, submission of specimen other  than nasopharyngeal swab, presence of viral mutation(s) within the  areas targeted by this assay, and inadequate number of viral copies  (<250 copies / mL). A negative result must be combined with clinical  observations, patient history, and epidemiological information. If result is POSITIVE SARS-CoV-2 target nucleic acids are DETECTED. The SARS-CoV-2 RNA is generally detectable in upper and lower  respiratory specimens dur ing the acute phase of infection.  Positive  results are indicative of active infection with SARS-CoV-2.  Clinical  correlation with patient history and other diagnostic information is  necessary to determine patient infection status.  Positive results do  not rule out bacterial infection or co-infection with other viruses. If result is PRESUMPTIVE POSTIVE SARS-CoV-2 nucleic acids MAY BE PRESENT.   A presumptive positive result was obtained on the submitted specimen  and confirmed on repeat testing.  While 2019 novel coronavirus  (SARS-CoV-2) nucleic acids may be present in the submitted sample  additional confirmatory testing may be necessary for epidemiological  and / or clinical management  purposes  to differentiate between  SARS-CoV-2 and other Sarbecovirus currently known to infect humans.  If clinically indicated additional testing with an alternate test  methodology [phone]) is advised. The SARS-CoV-2 RNA is generally  detectable in upper and lower respiratory sp ecimens during the acute  phase of infection. The expected result is Negative. Fact Sheet for Patients:  StrictlyIdeas.no Fact Sheet for Healthcare Providers: BankingDealers.co.za This test is not yet approved or cleared by the [place] FDA and has been authorized for detection and/or diagnosis of SARS-CoV-2 by FDA under an Emergency Use  Authorization (EUA).  This EUA will remain in effect (meaning this test can be used) for the duration of the COVID-19 declaration under Section 564(b)(1) of the Act, 21 U.S.C. section 360bbb-3(b)(1), unless the authorization is terminated or revoked sooner. Performed at [facility], [place] [address], [place], [place] [address]          Radiology Studies: Ct Head Wo Contrast  Result Date: 06/24/2018 CLINICAL DATA:  Left hand and face numbness upon awakening EXAM: CT HEAD WITHOUT CONTRAST TECHNIQUE: Contiguous axial images were obtained from the base of the skull through the vertex without intravenous contrast. COMPARISON:  None. FINDINGS: Brain: No evidence of acute infarction, hemorrhage, hydrocephalus, extra-axial collection or mass lesion/mass effect. Probable mild white matter disease with patchy low-density in the cerebral white matter. History of hypertension suggests this is chronic microvascular ischemia. Normal brain volume Vascular: No hyperdense vessel. There is atherosclerotic calcification. Skull: Normal. Negative for fracture or focal lesion. Sinuses/Orbits: No acute finding. IMPRESSION: 1. No acute finding.  No visible infarct. 2. Probable mild chronic small vessel ischemia. Electronically Signed   By: Monte Fantasia M.D.   On: 06/24/2018 15:10   Mr Jodene Nam Head Wo Contrast  Result Date: 06/24/2018 CLINICAL DATA:  Left hand and mouth numbness that started yesterday EXAM: MRI HEAD WITHOUT CONTRAST MRA HEAD WITHOUT CONTRAST TECHNIQUE: Multiplanar, multiecho pulse sequences of the brain and surrounding structures were obtained without intravenous contrast. Angiographic images of the head were obtained using MRA technique without contrast. 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 Mild to moderate for age white matter disease, likely chronic small vessel  ischemia given history of hypertension. Brain volume is normal. No acute hemorrhage, hydrocephalus, masslike finding, or collection. Vascular: Arterial findings below. Skull and upper cervical spine: Negative for marrow lesion Sinuses/Orbits: Minor mucosal thickening in paranasal sinuses. MRA HEAD FINDINGS Symmetric carotid arteries and branching. No proximal flow limiting stenosis or branch occlusion. The vertebral and basilar arteries are smooth and widely patent. Fetal type PCA anatomy on the right. Negative for aneurysm or branch occlusion. There is mild atherosclerotic irregularity of bilateral PCA branches. There is asymmetric blurring of proximal right MCA due to artifact, asymmetric due to head tilt. IMPRESSION: 1. 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 2. Mild to moderate white matter disease is likely chronic small vessel ischemia. 3. No emergent finding on intracranial MRA. There is mild atherosclerosis best seen in the posterior cerebral arteries. Electronically Signed   By: Monte Fantasia M.D.   On: 06/24/2018 18:22   Mr Brain Wo Contrast (neuro Protocol)  Result Date: 06/24/2018 CLINICAL DATA:  Left hand and mouth numbness that started yesterday EXAM: MRI HEAD WITHOUT CONTRAST MRA HEAD WITHOUT CONTRAST TECHNIQUE: Multiplanar, multiecho pulse sequences of the brain and surrounding structures were obtained without intravenous contrast. Angiographic images  of the head were obtained using MRA technique without contrast. 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 Mild to moderate for age white matter disease, likely chronic small vessel ischemia given history of hypertension. Brain volume is normal. No acute hemorrhage, hydrocephalus, masslike finding, or collection. Vascular: Arterial findings below. Skull and upper cervical spine: Negative for marrow lesion  Sinuses/Orbits: Minor mucosal thickening in paranasal sinuses. MRA HEAD FINDINGS Symmetric carotid arteries and branching. No proximal flow limiting stenosis or branch occlusion. The vertebral and basilar arteries are smooth and widely patent. Fetal type PCA anatomy on the right. Negative for aneurysm or branch occlusion. There is mild atherosclerotic irregularity of bilateral PCA branches. There is asymmetric blurring of proximal right MCA due to artifact, asymmetric due to head tilt. IMPRESSION: 1. 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 2. Mild to moderate white matter disease is likely chronic small vessel ischemia. 3. No emergent finding on intracranial MRA. There is mild atherosclerosis best seen in the posterior cerebral arteries. Electronically Signed   By: Monte Fantasia M.D.   On: 06/24/2018 18:22        Scheduled Meds:  aspirin  300 mg Rectal Daily   Or   aspirin  325 mg Oral Daily   atorvastatin  40 mg Oral q1800   enoxaparin (LOVENOX) injection  40 mg Subcutaneous Q24H   Continuous Infusions:   LOS: 0 days    Time spent: 35 mins.More than 50% of that time was spent in counseling and/or coordination of care.      Shelly Coss, MD [facility] Pager [phone]  If 7PM-7AM, please contact night-coverage [email or web address] Password [email or web address] 06/25/2018, 12:02 PM

## 2018-06-25 NOTE — Progress Notes (Signed)
Report called to [facility] Estill Bamberg, RN. [facility] has been called for transport.

## 2018-06-26 ENCOUNTER — Encounter (HOSPITAL_COMMUNITY): Payer: Self-pay

## 2018-06-26 LAB — SEDIMENTATION RATE: Sed Rate: 10 mm/hr (ref 0–22)

## 2018-06-26 MED ORDER — HYDRALAZINE HCL 20 MG/ML IJ SOLN: 10.0000 mg | INTRAMUSCULAR | Status: DC | PRN

## 2018-06-26 MED ORDER — LISINOPRIL-HYDROCHLOROTHIAZIDE 20-12.5 MG PO TABS: 1.0000 | ORAL_TABLET | Freq: Every day | ORAL | Status: DC

## 2018-06-26 MED ORDER — ASPIRIN EC 81 MG PO TBEC
81.0000 mg | DELAYED_RELEASE_TABLET | Freq: Every day | ORAL | Status: DC
  Administered 2018-06-27 – 2018-06-30 (×2): 81 mg via ORAL
  Filled 2018-06-26 (×2): qty 1

## 2018-06-26 MED ORDER — METOPROLOL TARTRATE 25 MG PO TABS
25.0000 mg | ORAL_TABLET | Freq: Two times a day (BID) | ORAL | Status: DC
  Administered 2018-06-26 (×2): 25 mg via ORAL
  Filled 2018-06-26 (×2): qty 1

## 2018-06-26 MED ORDER — CLOPIDOGREL BISULFATE 75 MG PO TABS
75.0000 mg | ORAL_TABLET | Freq: Every day | ORAL | Status: DC
  Administered 2018-06-26 – 2018-06-30 (×3): 75 mg via ORAL
  Filled 2018-06-26 (×3): qty 1

## 2018-06-26 MED ORDER — PROMETHAZINE HCL 25 MG/ML IJ SOLN
12.5000 mg | Freq: Four times a day (QID) | INTRAMUSCULAR | Status: DC | PRN
  Administered 2018-06-27 – 2018-06-29 (×2): 12.5 mg via INTRAVENOUS
  Filled 2018-06-26 (×3): qty 1

## 2018-06-26 MED ORDER — HYDRALAZINE HCL 20 MG/ML IJ SOLN
10.0000 mg | INTRAMUSCULAR | Status: DC | PRN
  Administered 2018-06-26 – 2018-06-29 (×3): 10 mg via INTRAVENOUS
  Filled 2018-06-26 (×2): qty 1

## 2018-06-26 MED ORDER — HYDROCHLOROTHIAZIDE 12.5 MG PO CAPS
12.5000 mg | ORAL_CAPSULE | Freq: Every day | ORAL | Status: DC
  Administered 2018-06-26: 17:00:00 12.5 mg via ORAL
  Filled 2018-06-26: qty 1

## 2018-06-26 MED ORDER — LISINOPRIL 20 MG PO TABS
20.0000 mg | ORAL_TABLET | Freq: Every day | ORAL | Status: DC
  Administered 2018-06-26: 17:00:00 20 mg via ORAL
  Filled 2018-06-26: qty 1

## 2018-06-26 NOTE — Progress Notes (Signed)
Patient admitted via [facility] from [place] to room 3W-20.  Patient A&O x4, no complaints, patient orientated to room & the importance of calling for assistance when needing to get out of bed.  Call light within reach.  RN will continue to monitor patient

## 2018-06-26 NOTE — Progress Notes (Signed)
STROKE TEAM PROGRESS NOTE   INTERVAL HISTORY I have personally reviewed history of presenting illness with the patient.  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  She denies any prior history of atrial fibrillation, palpitations or syncope or significant cardiac problems  Vitals:   06/25/18 2356 06/26/18 0321 06/26/18 0700 06/26/18 1138  BP: 106/61 116/61 122/61 129/83  Pulse: (!) 54 66 (!) 59 69  Resp: '16 15 17 18  ' Temp: 97.8 F (36.6 C) 97.7 F (36.5 C) 99.1 F (37.3 C) 99 F (37.2 C)  TempSrc: Oral Oral Oral Oral  SpO2: 99% 100% 99% 99%  Weight:      Height:        CBC:  Recent Labs  Lab 06/24/18 1404  WBC 7.0  HGB 14.4  HCT 41.0  MCV 84.0  PLT 544    Basic Metabolic Panel:  Recent Labs  Lab 06/24/18 1404  NA 140  K 3.9  CL 104  CO2 29  GLUCOSE 108*  BUN 12  CREATININE 0.79  CALCIUM 9.3   Lipid Panel:     Component Value Date/Time   CHOL 201 (H) 06/25/2018 0355   TRIG 141 06/25/2018 0355   HDL 46 06/25/2018 0355   CHOLHDL 4.4 06/25/2018 0355   VLDL 28 06/25/2018 0355   LDLCALC 127 (H) 06/25/2018 0355   HgbA1c:  Lab Results  Component Value Date   HGBA1C 5.7 (H) 06/25/2018   Urine Drug Screen: No results found for: LABOPIA, COCAINSCRNUR, LABBENZ, AMPHETMU, THCU, LABBARB  Alcohol Level No results found for: ETH  IMAGING Ct Head Wo Contrast  Result Date: 06/24/2018 CLINICAL DATA:  Left hand and face numbness upon awakening EXAM: CT HEAD WITHOUT CONTRAST TECHNIQUE: Contiguous axial images were obtained from the base of the skull through the vertex without intravenous contrast. COMPARISON:  None. FINDINGS: Brain: No evidence of acute infarction, hemorrhage, hydrocephalus, extra-axial collection or mass lesion/mass effect. Probable mild white matter disease with patchy low-density in the cerebral white matter. History of hypertension  suggests this is chronic microvascular ischemia. Normal brain volume Vascular: No hyperdense vessel. There is atherosclerotic calcification. Skull: Normal. Negative for fracture or focal lesion. Sinuses/Orbits: No acute finding. IMPRESSION: 1. No acute finding.  No visible infarct. 2. Probable mild chronic small vessel ischemia. Electronically Signed   By: Monte Fantasia M.D.   On: 06/24/2018 15:10   Mr Jodene Nam Head Wo Contrast  Result Date: 06/24/2018 CLINICAL DATA:  Left hand and mouth numbness that started yesterday EXAM: MRI HEAD WITHOUT CONTRAST MRA HEAD WITHOUT CONTRAST TECHNIQUE: Multiplanar, multiecho pulse sequences of the brain and surrounding structures were obtained without intravenous contrast. Angiographic images of the head were obtained using MRA technique without contrast. 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 Mild to moderate for age white matter disease, likely chronic small vessel ischemia given history of hypertension. Brain volume is normal. No acute hemorrhage, hydrocephalus, masslike finding, or collection. Vascular: Arterial findings below. Skull and upper cervical spine: Negative for marrow lesion Sinuses/Orbits: Minor mucosal thickening in paranasal sinuses. MRA HEAD FINDINGS Symmetric carotid arteries and branching. No proximal flow limiting stenosis or branch occlusion. The vertebral and basilar arteries are smooth and widely patent. Fetal type PCA anatomy on the right. Negative for aneurysm or branch occlusion. There is mild atherosclerotic  irregularity of bilateral PCA branches. There is asymmetric blurring of proximal right MCA due to artifact, asymmetric due to head tilt. IMPRESSION: 1. 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 2. Mild to moderate white matter disease is likely chronic small vessel ischemia. 3. No emergent finding on intracranial  MRA. There is mild atherosclerosis best seen in the posterior cerebral arteries. Electronically Signed   By: Monte Fantasia M.D.   On: 06/24/2018 18:22   Mr Brain Wo Contrast (neuro Protocol)  Result Date: 06/24/2018 CLINICAL DATA:  Left hand and mouth numbness that started yesterday EXAM: MRI HEAD WITHOUT CONTRAST MRA HEAD WITHOUT CONTRAST TECHNIQUE: Multiplanar, multiecho pulse sequences of the brain and surrounding structures were obtained without intravenous contrast. Angiographic images of the head were obtained using MRA technique without contrast. 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 Mild to moderate for age white matter disease, likely chronic small vessel ischemia given history of hypertension. Brain volume is normal. No acute hemorrhage, hydrocephalus, masslike finding, or collection. Vascular: Arterial findings below. Skull and upper cervical spine: Negative for marrow lesion Sinuses/Orbits: Minor mucosal thickening in paranasal sinuses. MRA HEAD FINDINGS Symmetric carotid arteries and branching. No proximal flow limiting stenosis or branch occlusion. The vertebral and basilar arteries are smooth and widely patent. Fetal type PCA anatomy on the right. Negative for aneurysm or branch occlusion. There is mild atherosclerotic irregularity of bilateral PCA branches. There is asymmetric blurring of proximal right MCA due to artifact, asymmetric due to head tilt. IMPRESSION: 1. 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 2. Mild to moderate white matter disease is likely chronic small vessel ischemia. 3. No emergent finding on intracranial MRA. There is mild atherosclerosis best seen in the posterior cerebral arteries. Electronically Signed   By: Monte Fantasia M.D.   On: 06/24/2018 18:22   Vas US Carotid (at [facility] Only)  Result Date: 06/26/2018 Carotid Arterial  Duplex Study Indications:       CVA and Numbness. Risk Factors:      Hypertension. Comparison Study:  No prior study on file for comparison. Performing Technologist: Sharion Dove RVS  Examination Guidelines: A complete evaluation includes B-mode imaging, spectral Doppler, color Doppler, and power Doppler as needed of all accessible portions of each vessel. Bilateral testing is considered an integral part of a complete examination. Limited examinations for reoccurring indications may be performed as noted.  Right Carotid Findings: +----------+--------+--------+--------+--------+------------------+           PSV cm/sEDV cm/sStenosisDescribeComments           +----------+--------+--------+--------+--------+------------------+ CCA Prox  78      16                      intimal thickening +----------+--------+--------+--------+--------+------------------+ CCA Distal74      18                      intimal thickening +----------+--------+--------+--------+--------+------------------+ ICA Prox  58      18                                         +----------+--------+--------+--------+--------+------------------+ ICA Distal76      15                                         +----------+--------+--------+--------+--------+------------------+  ECA       212     24              calcific                   +----------+--------+--------+--------+--------+------------------+ +----------+--------+-------+--------+-------------------+           PSV cm/sEDV cmsDescribeArm Pressure (mmHG) +----------+--------+-------+--------+-------------------+ Subclavian114                                        +----------+--------+-------+--------+-------------------+ +---------+--------+--+--------+--+ VertebralPSV cm/s57EDV cm/s14 +---------+--------+--+--------+--+  Left Carotid Findings: +----------+--------+--------+--------+------------+------------------+           PSV cm/sEDV  cm/sStenosisDescribe    Comments           +----------+--------+--------+--------+------------+------------------+ CCA Prox  64      17                          intimal thickening +----------+--------+--------+--------+------------+------------------+ CCA Distal69      19                          intimal thickening +----------+--------+--------+--------+------------+------------------+ ICA Prox  75      18              heterogenous                   +----------+--------+--------+--------+------------+------------------+ ICA Distal97      20                                             +----------+--------+--------+--------+------------+------------------+ ECA       85      14                                             +----------+--------+--------+--------+------------+------------------+ +----------+--------+--------+--------+-------------------+ SubclavianPSV cm/sEDV cm/sDescribeArm Pressure (mmHG) +----------+--------+--------+--------+-------------------+           95                                          +----------+--------+--------+--------+-------------------+ +---------+--------+--+--------+--+ VertebralPSV cm/s71EDV cm/s16 +---------+--------+--+--------+--+  Summary: Right Carotid: The extracranial vessels were near-normal with only minimal wall                thickening or plaque. Left Carotid: The extracranial vessels were near-normal with only minimal wall               thickening or plaque. Vertebrals:  Bilateral vertebral arteries demonstrate antegrade flow. Subclavians: Normal flow hemodynamics were seen in bilateral subclavian              arteries. *See table(s) above for measurements and observations.  Electronically signed by Antony Contras MD on 06/26/2018 at 1:18:53 PM.    Final     PHYSICAL EXAM Pleasant frail middle-aged African-American lady not in distress. . Afebrile. Head is nontraumatic. Neck is supple without bruit.    Cardiac  exam no murmur or gallop. Lungs are clear to auscultation. Distal pulses are well felt. Neurological Exam ;  Awake  Alert oriented x 3. Normal speech and language.eye movements full without nystagmus.fundi were not visualized. Vision acuity and fields appear normal. Hearing is normal. Palatal movements are normal. Face symmetric. Tongue midline. Normal strength, tone, reflexes and coordination.  Diminished fine finger movements on the left.  Orbits right over left upper extremity.  Mild left grip weakness.  Normal sensation. Gait deferred.  ASSESSMENT/PLAN Leslie Walker is a 54 y.o. female with history of HTN presenting with left upper extremity numbness and weakness and left facial numbness..   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 Mild Small vessel disease.   MRI  Small posterior R frontal lobe infarct. Mild Small vessel disease.   MRA  Mild atherosclerosis   Carotid Doppler  B ICA 1-39% stenosis, VAs antegrade   2D Echo EF 55-60%. Neg bubble. No source of embolus   TEE to look for embolic source. Arranged with [place] for tomorrow.  If positive for PFO (patent foramen ovale), check bilateral lower extremity venous dopplers to rule out DVT as possible source of stroke. (I have made patient NPO after midnight tonight).  If TEE negative, a [place] electrophysiologist will consult and consider placement of an implantable loop recorder to evaluate for atrial fibrillation as etiology of stroke. This has been explained to patient/family by Dr. Leonie Man and they are agreeable.   TCD bubble pending   Hypercoagulable labs (Cardiolipin antibodies, beta-2 glycoprotein, lupus anticoagulant, ANA, ESR, sickle cell, RPR) pending   LDL 127  HgbA1c 5.7  HIV neg  Lovenox 40 mg sq daily for VTE prophylaxis  No antithrombotic prior to admission,  now on aspirin 325 mg daily. Given mild stroke, recommend aspirin 81 mg and plavix 75 mg daily x 3 weeks, then aspirin alone. Orders adjusted.   Therapy recommendations:  No PT or OT, intermittent supervision  Disposition:  Return home  Hypertension  Stable . Permissive hypertension (OK if < 220/120) but gradually normalize in 5-7 days . Long-term BP goal normotensive  Hyperlipidemia  Home meds:  No statin  Now on Lipitor 40  LDL 127, goal < 70  Continue statin at discharge  Other Stroke Risk Factors  Cigar smoker, advised to stop smoking  Other Active Problems  Aortic regurgitation  Nausea on [facility] day # 1 I have personally obtained history,examined this patient, reviewed notes, independently viewed imaging studies, participated in medical decision making and plan of care.ROS completed by me personally and pertinent positives fully documented  I have made any additions or clarifications directly to the above note.  She presented with left hand weakness and numbness due to right frontal cortical and subcortical infarct likely of embolic etiology from an unknown source.  Recommend check TEE for cardiac source of embolism and loop recorder for paroxysmal A. fib.  Check transcranial Doppler bubble study for PFO.  Patient was counseled to quit smoking.  Dual antiplatelet therapy for 3 weeks followed by aspirin alone.  Greater than 50% time during this 35-minute visit was spent on counseling and coordination of care about her stroke and discussion about risk stratification and stroke work-up and answering questions.  Discussed with Dr. Syliva Overman, [place] Pager: [phone] 06/26/2018 2:56 PM   To contact Stroke Continuity provider, please refer to [email or web address] After hours, contact General Neurology

## 2018-06-26 NOTE — Progress Notes (Signed)
PROGRESS NOTE    Leslie Walker  XVQ:[id number] DOB: 1965/06/05 DOA: 06/24/2018 PCP: Elbert Ewings, FNP   Brief Narrative: Patient is a 53 year old female with history of hypertension who presented to the emergency department with complaints of left facial numbness, left arm, hand numbness and weakness of one day duration.  CT head on presentation did not show any acute intracranial abnormalities.  MRI of the brain showed small acute infarct in posterior right frontal lobe.  MRA did not show any emergent findings.  Neurology consulted.  She was transferred to [facility] from [facility] for neuro care.  Assessment & Plan:   Principal Problem:   TIA (transient ischemic attack) Active Problems:   Essential hypertension  Acute ischemic stroke: MRI finding as above.  2D echocardiogram normal. Carotid duplex is normal with atherosclerosis. Hemoglobin A1c of 5.7.  LDL of 127.  Started on Lipitor 40 mg daily. Continue aspirin for now.  Seen by neurology.  Suggesting 3 weeks of aspirin and Plavix. Seen by PT OT and speech.  No further intervention needed.  Aortic regurgitation: Echocardiogram showed normal systolic function with ejection fraction of 55 to 60%, impaired relaxation, basal inferior hypokinesis.  Also showed   moderate to severe aortic valve regurgitation, nodular calcification of the aortic valve.   Patient to be evaluated by cardiology for TEE and loop recorder placement.  Hypertension: On antihypertensives at home.  Will resume home medications.  Currently controlled.   Smoker: Counseled to quit.  Motivated.      DVT prophylaxis: Lovenox Code Status: Full Family Communication: Communicated the patient. Disposition Plan: Likely home tomorrow.   Consultants: Neurology  Procedures: MRI of the brain  Antimicrobials:  Anti-infectives (From admission, onward)   None      Subjective: Patient seen and examined.  No overnight events.  She had occasional weakness of the left hand  that has improved now.  She was able to hold phone through the left hand.  No other weaknesses.  Objective: Vitals:   06/25/18 2356 06/26/18 0321 06/26/18 0700 06/26/18 1138  BP: 106/61 116/61 122/61 129/83  Pulse: (!) 54 66 (!) 59 69  Resp: 16 15 17 18   Temp: 97.8 F (36.6 C) 97.7 F (36.5 C) 99.1 F (37.3 C) 99 F (37.2 C)  TempSrc: Oral Oral Oral Oral  SpO2: 99% 100% 99% 99%  Weight:      Height:        Intake/Output Summary (Last 24 hours) at 06/26/2018 1400 Last data filed at 06/25/2018 1800 Gross per 24 hour  Intake 240 ml  Output --  Net 240 ml   Filed Weights   06/24/18 1332  Weight: 69.9 kg    Examination:  General exam: Appears calm and comfortable ,Not in distress,average built HEENT:PERRL,Oral mucosa moist, Ear/Nose normal on gross exam Respiratory system: Bilateral equal air entry, normal vesicular breath sounds, no wheezes or crackles  Cardiovascular system: S1 & S2 heard, RRR.Marland Kitchen No pedal edema. Gastrointestinal system: Abdomen is nondistended, soft and nontender. No organomegaly or masses felt. Normal bowel sounds heard. Central nervous system: Alert and oriented.  Cranial nerves II to XII normal. Motor examination left hand grip 4+ which is slightly less strength than right side.  Other neurological examination normal. Extremities: No edema, no clubbing ,no cyanosis, distal peripheral pulses palpable. Skin: No rashes, lesions or ulcers,no icterus ,no pallor MSK: Normal muscle bulk,tone ,power Psychiatry: Judgement and insight appear normal. Mood & affect appropriate.     Data Reviewed: I have personally reviewed following labs  and imaging studies  CBC: Recent Labs  Lab 06/24/18 1404  WBC 7.0  HGB 14.4  HCT 41.0  MCV 84.0  PLT 254   Basic Metabolic Panel: Recent Labs  Lab 06/24/18 1404  NA 140  K 3.9  CL 104  CO2 29  GLUCOSE 108*  BUN 12  CREATININE 0.79  CALCIUM 9.3   GFR: Estimated Creatinine Clearance: 78.1 mL/min (by C-G formula  based on SCr of 0.79 mg/dL). Liver Function Tests: Recent Labs  Lab 06/24/18 1404  AST 19  ALT 13  ALKPHOS 74  BILITOT 0.7  PROT 8.0  ALBUMIN 4.2   No results for input(s): LIPASE, AMYLASE in the last 168 hours. No results for input(s): AMMONIA in the last 168 hours. Coagulation Profile: Recent Labs  Lab 06/24/18 1404  INR 0.9   Cardiac Enzymes: No results for input(s): CKTOTAL, CKMB, CKMBINDEX, TROPONINI in the last 168 hours. BNP (last 3 results) No results for input(s): PROBNP in the last 8760 hours. HbA1C: Recent Labs    06/25/18 0355  HGBA1C 5.7*   CBG: No results for input(s): GLUCAP in the last 168 hours. Lipid Profile: Recent Labs    06/25/18 0355  CHOL 201*  HDL 46  LDLCALC 127*  TRIG 141  CHOLHDL 4.4   Thyroid Function Tests: No results for input(s): TSH, T4TOTAL, FREET4, T3FREE, THYROIDAB in the last 72 hours. Anemia Panel: No results for input(s): VITAMINB12, FOLATE, FERRITIN, TIBC, IRON, RETICCTPCT in the last 72 hours. Sepsis Labs: No results for input(s): PROCALCITON, LATICACIDVEN in the last 168 hours.  Recent Results (from the past 240 hour(s))  SARS Coronavirus 2 (CEPHEID - Performed in [place] hospital lab), Hosp Order     Status: None   Collection Time: 06/24/18  1:54 PM  Result Value Ref Range Status   SARS Coronavirus 2 NEGATIVE NEGATIVE Final    Comment: (NOTE) If result is NEGATIVE SARS-CoV-2 target nucleic acids are NOT DETECTED. The SARS-CoV-2 RNA is generally detectable in upper and lower  respiratory specimens during the acute phase of infection. The lowest  concentration of SARS-CoV-2 viral copies this assay can detect is 250  copies / mL. A negative result does not preclude SARS-CoV-2 infection  and should not be used as the sole basis for treatment or other  patient management decisions.  A negative result may occur with  improper specimen collection / handling, submission of specimen other  than nasopharyngeal swab,  presence of viral mutation(s) within the  areas targeted by this assay, and inadequate number of viral copies  (<250 copies / mL). A negative result must be combined with clinical  observations, patient history, and epidemiological information. If result is POSITIVE SARS-CoV-2 target nucleic acids are DETECTED. The SARS-CoV-2 RNA is generally detectable in upper and lower  respiratory specimens dur ing the acute phase of infection.  Positive  results are indicative of active infection with SARS-CoV-2.  Clinical  correlation with patient history and other diagnostic information is  necessary to determine patient infection status.  Positive results do  not rule out bacterial infection or co-infection with other viruses. If result is PRESUMPTIVE POSTIVE SARS-CoV-2 nucleic acids MAY BE PRESENT.   A presumptive positive result was obtained on the submitted specimen  and confirmed on repeat testing.  While 2019 novel coronavirus  (SARS-CoV-2) nucleic acids may be present in the submitted sample  additional confirmatory testing may be necessary for epidemiological  and / or clinical management purposes  to differentiate between  SARS-CoV-2 and  other Sarbecovirus currently known to infect humans.  If clinically indicated additional testing with an alternate test  methodology [phone]) is advised. The SARS-CoV-2 RNA is generally  detectable in upper and lower respiratory sp ecimens during the acute  phase of infection. The expected result is Negative. Fact Sheet for Patients:  StrictlyIdeas.no Fact Sheet for Healthcare Providers: BankingDealers.co.za This test is not yet approved or cleared by the [place] FDA and has been authorized for detection and/or diagnosis of SARS-CoV-2 by FDA under an Emergency Use Authorization (EUA).  This EUA will remain in effect (meaning this test can be used) for the duration of the COVID-19 declaration under  Section 564(b)(1) of the Act, 21 U.S.C. section 360bbb-3(b)(1), unless the authorization is terminated or revoked sooner. Performed at [facility], [place] [address], [place], [place] [address]          Radiology Studies: Ct Head Wo Contrast  Result Date: 06/24/2018 CLINICAL DATA:  Left hand and face numbness upon awakening EXAM: CT HEAD WITHOUT CONTRAST TECHNIQUE: Contiguous axial images were obtained from the base of the skull through the vertex without intravenous contrast. COMPARISON:  None. FINDINGS: Brain: No evidence of acute infarction, hemorrhage, hydrocephalus, extra-axial collection or mass lesion/mass effect. Probable mild white matter disease with patchy low-density in the cerebral white matter. History of hypertension suggests this is chronic microvascular ischemia. Normal brain volume Vascular: No hyperdense vessel. There is atherosclerotic calcification. Skull: Normal. Negative for fracture or focal lesion. Sinuses/Orbits: No acute finding. IMPRESSION: 1. No acute finding.  No visible infarct. 2. Probable mild chronic small vessel ischemia. Electronically Signed   By: Monte Fantasia M.D.   On: 06/24/2018 15:10   Mr Jodene Nam Head Wo Contrast  Result Date: 06/24/2018 CLINICAL DATA:  Left hand and mouth numbness that started yesterday EXAM: MRI HEAD WITHOUT CONTRAST MRA HEAD WITHOUT CONTRAST TECHNIQUE: Multiplanar, multiecho pulse sequences of the brain and surrounding structures were obtained without intravenous contrast. Angiographic images of the head were obtained using MRA technique without contrast. 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 Mild to moderate for age white matter disease, likely chronic small vessel ischemia given history of hypertension. Brain volume is normal. No acute hemorrhage, hydrocephalus, masslike finding, or collection.  Vascular: Arterial findings below. Skull and upper cervical spine: Negative for marrow lesion Sinuses/Orbits: Minor mucosal thickening in paranasal sinuses. MRA HEAD FINDINGS Symmetric carotid arteries and branching. No proximal flow limiting stenosis or branch occlusion. The vertebral and basilar arteries are smooth and widely patent. Fetal type PCA anatomy on the right. Negative for aneurysm or branch occlusion. There is mild atherosclerotic irregularity of bilateral PCA branches. There is asymmetric blurring of proximal right MCA due to artifact, asymmetric due to head tilt. IMPRESSION: 1. 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 2. Mild to moderate white matter disease is likely chronic small vessel ischemia. 3. No emergent finding on intracranial MRA. There is mild atherosclerosis best seen in the posterior cerebral arteries. Electronically Signed   By: Monte Fantasia M.D.   On: 06/24/2018 18:22   Mr Brain Wo Contrast (neuro Protocol)  Result Date: 06/24/2018 CLINICAL DATA:  Left hand and mouth numbness that started yesterday EXAM: MRI HEAD WITHOUT CONTRAST MRA HEAD WITHOUT CONTRAST TECHNIQUE: Multiplanar, multiecho pulse sequences of the brain and surrounding structures were obtained without intravenous contrast. Angiographic images of the head were obtained using MRA technique  without contrast. 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 Mild to moderate for age white matter disease, likely chronic small vessel ischemia given history of hypertension. Brain volume is normal. No acute hemorrhage, hydrocephalus, masslike finding, or collection. Vascular: Arterial findings below. Skull and upper cervical spine: Negative for marrow lesion Sinuses/Orbits: Minor mucosal thickening in paranasal sinuses. MRA HEAD FINDINGS Symmetric carotid arteries and branching. No proximal flow  limiting stenosis or branch occlusion. The vertebral and basilar arteries are smooth and widely patent. Fetal type PCA anatomy on the right. Negative for aneurysm or branch occlusion. There is mild atherosclerotic irregularity of bilateral PCA branches. There is asymmetric blurring of proximal right MCA due to artifact, asymmetric due to head tilt. IMPRESSION: 1. 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 2. Mild to moderate white matter disease is likely chronic small vessel ischemia. 3. No emergent finding on intracranial MRA. There is mild atherosclerosis best seen in the posterior cerebral arteries. Electronically Signed   By: Monte Fantasia M.D.   On: 06/24/2018 18:22   Vas US Carotid (at [place] Only)  Result Date: 06/26/2018 Carotid Arterial Duplex Study Indications:       CVA and Numbness. Risk Factors:      Hypertension. Comparison Study:  No prior study on file for comparison. Performing Technologist: Sharion Dove RVS  Examination Guidelines: A complete evaluation includes B-mode imaging, spectral Doppler, color Doppler, and power Doppler as needed of all accessible portions of each vessel. Bilateral testing is considered an integral part of a complete examination. Limited examinations for reoccurring indications may be performed as noted.  Right Carotid Findings: +----------+--------+--------+--------+--------+------------------+             PSV cm/s EDV cm/s Stenosis Describe Comments            +----------+--------+--------+--------+--------+------------------+  CCA Prox   78       16                         intimal thickening  +----------+--------+--------+--------+--------+------------------+  CCA Distal 74       18                         intimal thickening  +----------+--------+--------+--------+--------+------------------+  ICA Prox   58       18                                             +----------+--------+--------+--------+--------+------------------+  ICA Distal 76        15                                             +----------+--------+--------+--------+--------+------------------+  ECA        212      24                calcific                     +----------+--------+--------+--------+--------+------------------+ +----------+--------+-------+--------+-------------------+             PSV cm/s EDV cms Describe Arm Pressure (mmHG)  +----------+--------+-------+--------+-------------------+  Subclavian 114                                            +----------+--------+-------+--------+-------------------+ +---------+--------+--+--------+--+  Vertebral PSV cm/s 57 EDV cm/s 14  +---------+--------+--+--------+--+  Left Carotid Findings: +----------+--------+--------+--------+------------+------------------+             PSV cm/s EDV cm/s Stenosis Describe     Comments            +----------+--------+--------+--------+------------+------------------+  CCA Prox   64       17                             intimal thickening  +----------+--------+--------+--------+------------+------------------+  CCA Distal 69       19                             intimal thickening  +----------+--------+--------+--------+------------+------------------+  ICA Prox   75       18                heterogenous                     +----------+--------+--------+--------+------------+------------------+  ICA Distal 97       20                                                 +----------+--------+--------+--------+------------+------------------+  ECA        85       14                                                 +----------+--------+--------+--------+------------+------------------+ +----------+--------+--------+--------+-------------------+  Subclavian PSV cm/s EDV cm/s Describe Arm Pressure (mmHG)  +----------+--------+--------+--------+-------------------+             95                                              +----------+--------+--------+--------+-------------------+  +---------+--------+--+--------+--+  Vertebral PSV cm/s 71 EDV cm/s 16  +---------+--------+--+--------+--+  Summary: Right Carotid: The extracranial vessels were near-normal with only minimal wall                thickening or plaque. Left Carotid: The extracranial vessels were near-normal with only minimal wall               thickening or plaque. Vertebrals:  Bilateral vertebral arteries demonstrate antegrade flow. Subclavians: Normal flow hemodynamics were seen in bilateral subclavian              arteries. *See table(s) above for measurements and observations.  Electronically signed by Antony Contras MD on 06/26/2018 at 1:18:53 PM.    Final         Scheduled Meds:  aspirin  300 mg Rectal Daily   Or   aspirin  325 mg Oral Daily   atorvastatin  40 mg Oral q1800   enoxaparin (LOVENOX) injection  40 mg Subcutaneous Q24H   Continuous Infusions:   LOS: 1 day    Time spent: 25 minutes      Barb Merino, MD [facility] Pager [phone]  If 7PM-7AM, please contact night-coverage [email or web address] Password [email or web address] 06/26/2018, 2:00 PM

## 2018-06-26 NOTE — H&P (View-Only) (Signed)
STROKE TEAM PROGRESS NOTE   INTERVAL HISTORY I have personally reviewed history of presenting illness with the patient.  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  She denies any prior history of atrial fibrillation, palpitations or syncope or significant cardiac problems  Vitals:   06/25/18 2356 06/26/18 0321 06/26/18 0700 06/26/18 1138  BP: 106/61 116/61 122/61 129/83  Pulse: (!) 54 66 (!) 59 69  Resp: '16 15 17 18  ' Temp: 97.8 F (36.6 C) 97.7 F (36.5 C) 99.1 F (37.3 C) 99 F (37.2 C)  TempSrc: Oral Oral Oral Oral  SpO2: 99% 100% 99% 99%  Weight:      Height:        CBC:  Recent Labs  Lab 06/24/18 1404  WBC 7.0  HGB 14.4  HCT 41.0  MCV 84.0  PLT 194    Basic Metabolic Panel:  Recent Labs  Lab 06/24/18 1404  NA 140  K 3.9  CL 104  CO2 29  GLUCOSE 108*  BUN 12  CREATININE 0.79  CALCIUM 9.3   Lipid Panel:     Component Value Date/Time   CHOL 201 (H) 06/25/2018 0355   TRIG 141 06/25/2018 0355   HDL 46 06/25/2018 0355   CHOLHDL 4.4 06/25/2018 0355   VLDL 28 06/25/2018 0355   LDLCALC 127 (H) 06/25/2018 0355   HgbA1c:  Lab Results  Component Value Date   HGBA1C 5.7 (H) 06/25/2018   Urine Drug Screen: No results found for: LABOPIA, COCAINSCRNUR, LABBENZ, AMPHETMU, THCU, LABBARB  Alcohol Level No results found for: ETH  IMAGING Ct Head Wo Contrast  Result Date: 06/24/2018 CLINICAL DATA:  Left hand and face numbness upon awakening EXAM: CT HEAD WITHOUT CONTRAST TECHNIQUE: Contiguous axial images were obtained from the base of the skull through the vertex without intravenous contrast. COMPARISON:  None. FINDINGS: Brain: No evidence of acute infarction, hemorrhage, hydrocephalus, extra-axial collection or mass lesion/mass effect. Probable mild white matter disease with patchy low-density in the cerebral white matter. History of hypertension  suggests this is chronic microvascular ischemia. Normal brain volume Vascular: No hyperdense vessel. There is atherosclerotic calcification. Skull: Normal. Negative for fracture or focal lesion. Sinuses/Orbits: No acute finding. IMPRESSION: 1. No acute finding.  No visible infarct. 2. Probable mild chronic small vessel ischemia. Electronically Signed   By: Monte Fantasia M.D.   On: 06/24/2018 15:10   Mr Jodene Nam Head Wo Contrast  Result Date: 06/24/2018 CLINICAL DATA:  Left hand and mouth numbness that started yesterday EXAM: MRI HEAD WITHOUT CONTRAST MRA HEAD WITHOUT CONTRAST TECHNIQUE: Multiplanar, multiecho pulse sequences of the brain and surrounding structures were obtained without intravenous contrast. Angiographic images of the head were obtained using MRA technique without contrast. 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 Mild to moderate for age white matter disease, likely chronic small vessel ischemia given history of hypertension. Brain volume is normal. No acute hemorrhage, hydrocephalus, masslike finding, or collection. Vascular: Arterial findings below. Skull and upper cervical spine: Negative for marrow lesion Sinuses/Orbits: Minor mucosal thickening in paranasal sinuses. MRA HEAD FINDINGS Symmetric carotid arteries and branching. No proximal flow limiting stenosis or branch occlusion. The vertebral and basilar arteries are smooth and widely patent. Fetal type PCA anatomy on the right. Negative for aneurysm or branch occlusion. There is mild atherosclerotic  irregularity of bilateral PCA branches. There is asymmetric blurring of proximal right MCA due to artifact, asymmetric due to head tilt. IMPRESSION: 1. 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 2. Mild to moderate white matter disease is likely chronic small vessel ischemia. 3. No emergent finding on intracranial  MRA. There is mild atherosclerosis best seen in the posterior cerebral arteries. Electronically Signed   By: Monte Fantasia M.D.   On: 06/24/2018 18:22   Mr Brain Wo Contrast (neuro Protocol)  Result Date: 06/24/2018 CLINICAL DATA:  Left hand and mouth numbness that started yesterday EXAM: MRI HEAD WITHOUT CONTRAST MRA HEAD WITHOUT CONTRAST TECHNIQUE: Multiplanar, multiecho pulse sequences of the brain and surrounding structures were obtained without intravenous contrast. Angiographic images of the head were obtained using MRA technique without contrast. 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 Mild to moderate for age white matter disease, likely chronic small vessel ischemia given history of hypertension. Brain volume is normal. No acute hemorrhage, hydrocephalus, masslike finding, or collection. Vascular: Arterial findings below. Skull and upper cervical spine: Negative for marrow lesion Sinuses/Orbits: Minor mucosal thickening in paranasal sinuses. MRA HEAD FINDINGS Symmetric carotid arteries and branching. No proximal flow limiting stenosis or branch occlusion. The vertebral and basilar arteries are smooth and widely patent. Fetal type PCA anatomy on the right. Negative for aneurysm or branch occlusion. There is mild atherosclerotic irregularity of bilateral PCA branches. There is asymmetric blurring of proximal right MCA due to artifact, asymmetric due to head tilt. IMPRESSION: 1. 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 2. Mild to moderate white matter disease is likely chronic small vessel ischemia. 3. No emergent finding on intracranial MRA. There is mild atherosclerosis best seen in the posterior cerebral arteries. Electronically Signed   By: Monte Fantasia M.D.   On: 06/24/2018 18:22   Vas US Carotid (at [facility] Only)  Result Date: 06/26/2018 Carotid Arterial  Duplex Study Indications:       CVA and Numbness. Risk Factors:      Hypertension. Comparison Study:  No prior study on file for comparison. Performing Technologist: Sharion Dove RVS  Examination Guidelines: A complete evaluation includes B-mode imaging, spectral Doppler, color Doppler, and power Doppler as needed of all accessible portions of each vessel. Bilateral testing is considered an integral part of a complete examination. Limited examinations for reoccurring indications may be performed as noted.  Right Carotid Findings: +----------+--------+--------+--------+--------+------------------+           PSV cm/sEDV cm/sStenosisDescribeComments           +----------+--------+--------+--------+--------+------------------+ CCA Prox  78      16                      intimal thickening +----------+--------+--------+--------+--------+------------------+ CCA Distal74      18                      intimal thickening +----------+--------+--------+--------+--------+------------------+ ICA Prox  58      18                                         +----------+--------+--------+--------+--------+------------------+ ICA Distal76      15                                         +----------+--------+--------+--------+--------+------------------+  ECA       212     24              calcific                   +----------+--------+--------+--------+--------+------------------+ +----------+--------+-------+--------+-------------------+           PSV cm/sEDV cmsDescribeArm Pressure (mmHG) +----------+--------+-------+--------+-------------------+ Subclavian114                                        +----------+--------+-------+--------+-------------------+ +---------+--------+--+--------+--+ VertebralPSV cm/s57EDV cm/s14 +---------+--------+--+--------+--+  Left Carotid Findings: +----------+--------+--------+--------+------------+------------------+           PSV cm/sEDV  cm/sStenosisDescribe    Comments           +----------+--------+--------+--------+------------+------------------+ CCA Prox  64      17                          intimal thickening +----------+--------+--------+--------+------------+------------------+ CCA Distal69      19                          intimal thickening +----------+--------+--------+--------+------------+------------------+ ICA Prox  75      18              heterogenous                   +----------+--------+--------+--------+------------+------------------+ ICA Distal97      20                                             +----------+--------+--------+--------+------------+------------------+ ECA       85      14                                             +----------+--------+--------+--------+------------+------------------+ +----------+--------+--------+--------+-------------------+ SubclavianPSV cm/sEDV cm/sDescribeArm Pressure (mmHG) +----------+--------+--------+--------+-------------------+           95                                          +----------+--------+--------+--------+-------------------+ +---------+--------+--+--------+--+ VertebralPSV cm/s71EDV cm/s16 +---------+--------+--+--------+--+  Summary: Right Carotid: The extracranial vessels were near-normal with only minimal wall                thickening or plaque. Left Carotid: The extracranial vessels were near-normal with only minimal wall               thickening or plaque. Vertebrals:  Bilateral vertebral arteries demonstrate antegrade flow. Subclavians: Normal flow hemodynamics were seen in bilateral subclavian              arteries. *See table(s) above for measurements and observations.  Electronically signed by Antony Contras MD on 06/26/2018 at 1:18:53 PM.    Final     PHYSICAL EXAM Pleasant frail middle-aged African-American lady not in distress. . Afebrile. Head is nontraumatic. Neck is supple without bruit.    Cardiac  exam no murmur or gallop. Lungs are clear to auscultation. Distal pulses are well felt. Neurological Exam ;  Awake  Alert oriented x 3. Normal speech and language.eye movements full without nystagmus.fundi were not visualized. Vision acuity and fields appear normal. Hearing is normal. Palatal movements are normal. Face symmetric. Tongue midline. Normal strength, tone, reflexes and coordination.  Diminished fine finger movements on the left.  Orbits right over left upper extremity.  Mild left grip weakness.  Normal sensation. Gait deferred.  ASSESSMENT/PLAN [name] is a 53 y.o. female with history of HTN presenting with left upper extremity numbness and weakness and left facial numbness..   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 Mild Small vessel disease.   MRI  Small posterior R frontal lobe infarct. Mild Small vessel disease.   MRA  Mild atherosclerosis   Carotid Doppler  B ICA 1-39% stenosis, VAs antegrade   2D Echo EF 55-60%. Neg bubble. No source of embolus   TEE to look for embolic source. Arranged with [place] for tomorrow.  If positive for PFO (patent foramen ovale), check bilateral lower extremity venous dopplers to rule out DVT as possible source of stroke. (I have made patient NPO after midnight tonight).  If TEE negative, a [place] electrophysiologist will consult and consider placement of an implantable loop recorder to evaluate for atrial fibrillation as etiology of stroke. This has been explained to patient/family by Dr. Leonie Man and they are agreeable.   TCD bubble pending   Hypercoagulable labs (Cardiolipin antibodies, beta-2 glycoprotein, lupus anticoagulant, ANA, ESR, sickle cell, RPR) pending   LDL 127  HgbA1c 5.7  HIV neg  Lovenox 40 mg sq daily for VTE prophylaxis  No antithrombotic prior to admission,  now on aspirin 325 mg daily. Given mild stroke, recommend aspirin 81 mg and plavix 75 mg daily x 3 weeks, then aspirin alone. Orders adjusted.   Therapy recommendations:  No PT or OT, intermittent supervision  Disposition:  Return home  Hypertension  Stable . Permissive hypertension (OK if < 220/120) but gradually normalize in 5-7 days . Long-term BP goal normotensive  Hyperlipidemia  Home meds:  No statin  Now on Lipitor 40  LDL 127, goal < 70  Continue statin at discharge  Other Stroke Risk Factors  Cigar smoker, advised to stop smoking  Other Active Problems  Aortic regurgitation  Nausea on [facility] day # 1 I have personally obtained history,examined this patient, reviewed notes, independently viewed imaging studies, participated in medical decision making and plan of care.ROS completed by me personally and pertinent positives fully documented  I have made any additions or clarifications directly to the above note.  She presented with left hand weakness and numbness due to right frontal cortical and subcortical infarct likely of embolic etiology from an unknown source.  Recommend check TEE for cardiac source of embolism and loop recorder for paroxysmal A. fib.  Check transcranial Doppler bubble study for PFO.  Patient was counseled to quit smoking.  Dual antiplatelet therapy for 3 weeks followed by aspirin alone.  Greater than 50% time during this 35-minute visit was spent on counseling and coordination of care about her stroke and discussion about risk stratification and stroke work-up and answering questions.  Discussed with Dr. Syliva Overman, [place] Pager: [phone] 06/26/2018 2:56 PM   To contact Stroke Continuity provider, please refer to [email or web address] After hours, contact General Neurology

## 2018-06-27 ENCOUNTER — Inpatient Hospital Stay (HOSPITAL_COMMUNITY): Payer: Self-pay

## 2018-06-27 ENCOUNTER — Encounter (HOSPITAL_COMMUNITY): Payer: Self-pay | Admitting: *Deleted

## 2018-06-27 ENCOUNTER — Encounter (HOSPITAL_COMMUNITY)
Admission: EM | Disposition: A | Discharge status: Home / Self Care | Payer: Self-pay | Source: Home / Self Care | Attending: Internal Medicine

## 2018-06-27 DIAGNOSIS — I6389 Other cerebral infarction: Secondary | ICD-10-CM

## 2018-06-27 HISTORY — PX: LOOP RECORDER INSERTION: EP1214

## 2018-06-27 LAB — CBC WITH DIFFERENTIAL/PLATELET
Abs Immature Granulocytes: 0.03 10*3/uL (ref 0.00–0.07)
Basophils Absolute: 0 10*3/uL (ref 0.0–0.1)
Basophils Relative: 0 %
Eosinophils Absolute: 0 10*3/uL (ref 0.0–0.5)
Eosinophils Relative: 0 %
Hemoglobin: 16.5 g/dL — ABNORMAL HIGH (ref 12.0–15.0)
Immature Granulocytes: 0 %
Lymphocytes Relative: 21 %
Lymphs Abs: 1.9 10*3/uL (ref 0.7–4.0)
Monocytes Absolute: 0.6 10*3/uL (ref 0.1–1.0)
Monocytes Relative: 6 %
Neutro Abs: 6.4 10*3/uL (ref 1.7–7.7)
Neutrophils Relative %: 73 %
Platelets: 241 10*3/uL (ref 150–400)
WBC: 8.9 10*3/uL (ref 4.0–10.5)
nRBC: 0 % (ref 0.0–0.2)

## 2018-06-27 LAB — URINALYSIS, ROUTINE W REFLEX MICROSCOPIC
Bilirubin Urine: NEGATIVE
Glucose, UA: NEGATIVE mg/dL
Ketones, ur: NEGATIVE mg/dL
Leukocytes,Ua: NEGATIVE
Nitrite: NEGATIVE
Protein, ur: 100 mg/dL — AB
Specific Gravity, Urine: 1.024 (ref 1.005–1.030)
pH: 6 (ref 5.0–8.0)

## 2018-06-27 LAB — BASIC METABOLIC PANEL
Anion gap: 14 (ref 5–15)
BUN: 14 mg/dL (ref 6–20)
CO2: 25 mmol/L (ref 22–32)
Calcium: 9.7 mg/dL (ref 8.9–10.3)
Chloride: 96 mmol/L — ABNORMAL LOW (ref 98–111)
Creatinine, Ser: 0.74 mg/dL (ref 0.44–1.00)
GFR calc Af Amer: >60 mL/min (ref >60)
GFR calc non Af Amer: >60 mL/min (ref >60)
Glucose, Bld: 114 mg/dL — ABNORMAL HIGH (ref 70–99)
Potassium: 3 mmol/L — ABNORMAL LOW (ref 3.5–5.1)
Sodium: 135 mmol/L (ref 135–145)

## 2018-06-27 LAB — MAGNESIUM: Magnesium: 2 mg/dL (ref 1.7–2.4)

## 2018-06-27 LAB — BETA-2-GLYCOPROTEIN I ABS, IGG/M/A
Beta-2 Glyco I IgG: <9 GPI IgG units (ref 0–20)
Beta-2-Glycoprotein I IgA: <9 GPI IgA units (ref 0–25)
Beta-2-Glycoprotein I IgM: <9 GPI IgM units (ref 0–32)

## 2018-06-27 LAB — RPR: RPR Ser Ql: NONREACTIVE

## 2018-06-27 LAB — CARDIOLIPIN ANTIBODIES, IGG, IGM, IGA
Anticardiolipin IgA: <9 APL U/mL (ref 0–11)
Anticardiolipin IgG: <9 GPL U/mL (ref 0–14)
Anticardiolipin IgM: <9 MPL U/mL (ref 0–12)

## 2018-06-27 LAB — SICKLE CELL SCREEN: Sickle Cell Screen: NEGATIVE

## 2018-06-27 LAB — ANTINUCLEAR ANTIBODIES, IFA: ANA Ab, IFA: NEGATIVE

## 2018-06-27 SURGERY — LOOP RECORDER INSERTION

## 2018-06-27 SURGERY — INVASIVE LAB ABORTED CASE

## 2018-06-27 MED ORDER — FENTANYL CITRATE (PF) 100 MCG/2ML IJ SOLN
INTRAMUSCULAR | Status: AC
  Filled 2018-06-27: qty 4

## 2018-06-27 MED ORDER — LISINOPRIL 20 MG PO TABS
40.0000 mg | ORAL_TABLET | Freq: Every day | ORAL | Status: DC
  Administered 2018-06-27: 40 mg via ORAL
  Filled 2018-06-27 (×2): qty 2

## 2018-06-27 MED ORDER — SODIUM CHLORIDE 0.9 % IV SOLN: INTRAVENOUS | Status: DC

## 2018-06-27 MED ORDER — HYDROCHLOROTHIAZIDE 25 MG PO TABS
25.0000 mg | ORAL_TABLET | Freq: Every day | ORAL | Status: DC
  Administered 2018-06-27: 25 mg via ORAL
  Filled 2018-06-27: qty 1

## 2018-06-27 MED ORDER — DIPHENHYDRAMINE HCL 50 MG/ML IJ SOLN
INTRAMUSCULAR | Status: AC
  Filled 2018-06-27: qty 1

## 2018-06-27 MED ORDER — HYDRALAZINE HCL 20 MG/ML IJ SOLN
INTRAMUSCULAR | Status: AC
  Filled 2018-06-27: qty 1

## 2018-06-27 MED ORDER — METOPROLOL TARTRATE 50 MG PO TABS
50.0000 mg | ORAL_TABLET | Freq: Two times a day (BID) | ORAL | Status: DC
  Administered 2018-06-27: 10:00:00 50 mg via ORAL
  Filled 2018-06-27 (×2): qty 1

## 2018-06-27 MED ORDER — POTASSIUM CHLORIDE CRYS ER 20 MEQ PO TBCR: 40.0000 meq | EXTENDED_RELEASE_TABLET | ORAL | Status: DC

## 2018-06-27 MED ORDER — MIDAZOLAM HCL (PF) 10 MG/2ML IJ SOLN
INTRAMUSCULAR | Status: DC | PRN
  Administered 2018-06-27: 2 mg via INTRAVENOUS
  Administered 2018-06-27: 1 mg via INTRAVENOUS

## 2018-06-27 MED ORDER — LIDOCAINE-EPINEPHRINE 1 %-1:100000 IJ SOLN
INTRAMUSCULAR | Status: AC
  Filled 2018-06-27: qty 1

## 2018-06-27 MED ORDER — MIDAZOLAM HCL (PF) 5 MG/ML IJ SOLN
INTRAMUSCULAR | Status: AC
  Filled 2018-06-27: qty 2

## 2018-06-27 MED ORDER — SODIUM CHLORIDE 0.9 % IV SOLN
INTRAVENOUS | Status: DC
  Administered 2018-06-27 – 2018-06-28 (×2): via INTRAVENOUS
  Filled 2018-06-27 (×10): qty 1000

## 2018-06-27 MED ORDER — FENTANYL CITRATE (PF) 100 MCG/2ML IJ SOLN
INTRAMUSCULAR | Status: DC | PRN
  Administered 2018-06-27 (×2): 25 ug via INTRAVENOUS

## 2018-06-27 MED ORDER — LIDOCAINE-EPINEPHRINE 1 %-1:100000 IJ SOLN
INTRAMUSCULAR | Status: DC | PRN
  Administered 2018-06-27: 30 mL

## 2018-06-27 SURGICAL SUPPLY — 2 items
LOOP REVEAL LINQSYS (Prosthesis & Implant Heart) ×2 IMPLANT
PACK LOOP INSERTION (CUSTOM PROCEDURE TRAY) ×3 IMPLANT

## 2018-06-27 NOTE — Progress Notes (Signed)
Entered patient's room and she was sleeping in chair. Difficult to arouse with confusion with delayed verbal response. Notified CN and within approx 45 seconds after, she was alert and oriented with normal response to questions and commands. BP 95/56, HR 86 and reg and no other s/s. Patient states she felt "out of it for a bit". She self transferred back to bed with SBA and bed alarm on. She agrees to notify someone if she has any other s/s.

## 2018-06-27 NOTE — Progress Notes (Signed)
PROGRESS NOTE    Leslie Walker  TSV:[id number] DOB: 1965/04/17 DOA: 06/24/2018 PCP: Elbert Ewings, FNP   Brief Narrative: Patient is a 53 year old female with history of hypertension who presented to the emergency department with complaints of left facial numbness, left arm, hand numbness and weakness of one day duration.  CT head on presentation did not show any acute intracranial abnormalities.  MRI of the brain showed small acute infarct in posterior right frontal lobe.  MRA did not show any emergent findings.  Neurology consulted.  She was transferred to [facility] from [facility] for neuro care.  Assessment & Plan:   Principal Problem:   TIA (transient ischemic attack) Active Problems:   Essential hypertension  Acute ischemic stroke: MRI finding as above.  2D echocardiogram normal. Carotid duplex is normal with atherosclerosis. Hemoglobin A1c of 5.7.  LDL of 127.  Started on Lipitor 40 mg daily. Continue aspirin for now.  Seen by neurology.  Suggesting 3 weeks of aspirin and Plavix and to continue aspirin. Seen by PT OT and speech.  No further intervention needed. Patient was recommended to have TEE and loop recorder placement to evaluate for any thromboembolism as well as A. fib.  Undergoing procedures today.  Aortic regurgitation: Echocardiogram showed normal systolic function with ejection fraction of 55 to 60%, impaired relaxation, basal inferior hypokinesis.  Also showed   moderate to severe aortic valve regurgitation, nodular calcification of the aortic valve.   Patient to be evaluated by cardiology for TEE and loop recorder placement.  Hypertension: Accelerated.  On antihypertensives at home.  Increasing dose of metoprolol and lisinopril hydrochlorothiazide.  Currently controlled.   Smoker: Counseled to quit.  Motivated.   Patient could not tolerate TEE because of frequent emesis after sedation and procedure canceled.  Will be rescheduled for tomorrow. Undergoing loop recorder  placement.   DVT prophylaxis: Lovenox Code Status: Full Family Communication: Communicated the patient. Disposition Plan: Likely home tomorrow after TEE procedure.   Consultants: Neurology  Procedures: MRI of the brain  Antimicrobials:  Anti-infectives (From admission, onward)   None      Subjective: Patient seen and examined.  Went for echocardiogram, had to come back because of emesis. She had good normal bowel movement.  Denies any abdominal pain.  Denies current nausea vomiting.  She her abdomen is benign.  Objective: Vitals:   06/27/18 1115 06/27/18 1127 06/27/18 1135 06/27/18 1245  BP: (!) 204/102 (!) 204/115 (!) 194/90 130/79  Pulse: 76 65 73 87  Resp: (!) 26 (!) 26 (!) 26 20  Temp:  99.1 F (37.3 C)    TempSrc:  Temporal    SpO2: 99% 99% 99% 99%  Weight:      Height:        Intake/Output Summary (Last 24 hours) at 06/27/2018 1434 Last data filed at 06/27/2018 0900 Gross per 24 hour  Intake 0 ml  Output -  Net 0 ml   Filed Weights   06/24/18 1332  Weight: 69.9 kg    Examination:  General exam: Appears calm and comfortable ,Not in distress,average built HEENT:PERRL,Oral mucosa moist, Ear/Nose normal on gross exam Respiratory system: Bilateral equal air entry, normal vesicular breath sounds, no wheezes or crackles  Cardiovascular system: S1 & S2 heard, RRR.Marland Kitchen No pedal edema. Gastrointestinal system: Abdomen is nondistended, soft and nontender. No organomegaly or masses felt. Normal bowel sounds heard. Central nervous system: Alert and oriented.  Cranial nerves II to XII normal. Motor examination left hand grip 4+ which is slightly less strength  than right side.  Other neurological examination normal. Extremities: No edema, no clubbing ,no cyanosis, distal peripheral pulses palpable. Skin: No rashes, lesions or ulcers,no icterus ,no pallor MSK: Normal muscle bulk,tone ,power Psychiatry: Judgement and insight appear normal. Mood & affect appropriate.      Data Reviewed: I have personally reviewed following labs and imaging studies  CBC: Recent Labs  Lab 06/24/18 1404 06/27/18 0739  WBC 7.0 8.9  NEUTROABS  --  6.4  HGB 14.4 16.5*  HCT 41.0 RESULTS UNAVAILABLE DUE TO INTERFERING SUBSTANCE  MCV 84.0 RESULTS UNAVAILABLE DUE TO INTERFERING SUBSTANCE  PLT 227 161   Basic Metabolic Panel: Recent Labs  Lab 06/24/18 1404 06/27/18 0739  NA 140 135  K 3.9 3.0*  CL 104 96*  CO2 29 25  GLUCOSE 108* 114*  BUN 12 14  CREATININE 0.79 0.74  CALCIUM 9.3 9.7  MG  --  2.0   GFR: Estimated Creatinine Clearance: 78.1 mL/min (by C-G formula based on SCr of 0.74 mg/dL). Liver Function Tests: Recent Labs  Lab 06/24/18 1404  AST 19  ALT 13  ALKPHOS 74  BILITOT 0.7  PROT 8.0  ALBUMIN 4.2   No results for input(s): LIPASE, AMYLASE in the last 168 hours. No results for input(s): AMMONIA in the last 168 hours. Coagulation Profile: Recent Labs  Lab 06/24/18 1404  INR 0.9   Cardiac Enzymes: No results for input(s): CKTOTAL, CKMB, CKMBINDEX, TROPONINI in the last 168 hours. BNP (last 3 results) No results for input(s): PROBNP in the last 8760 hours. HbA1C: Recent Labs    06/25/18 0355  HGBA1C 5.7*   CBG: No results for input(s): GLUCAP in the last 168 hours. Lipid Profile: Recent Labs    06/25/18 0355  CHOL 201*  HDL 46  LDLCALC 127*  TRIG 141  CHOLHDL 4.4   Thyroid Function Tests: No results for input(s): TSH, T4TOTAL, FREET4, T3FREE, THYROIDAB in the last 72 hours. Anemia Panel: No results for input(s): VITAMINB12, FOLATE, FERRITIN, TIBC, IRON, RETICCTPCT in the last 72 hours. Sepsis Labs: No results for input(s): PROCALCITON, LATICACIDVEN in the last 168 hours.  Recent Results (from the past 240 hour(s))  SARS Coronavirus 2 (CEPHEID - Performed in [place] hospital lab), Hosp Order     Status: None   Collection Time: 06/24/18  1:54 PM  Result Value Ref Range Status   SARS Coronavirus 2 NEGATIVE NEGATIVE Final     Comment: (NOTE) If result is NEGATIVE SARS-CoV-2 target nucleic acids are NOT DETECTED. The SARS-CoV-2 RNA is generally detectable in upper and lower  respiratory specimens during the acute phase of infection. The lowest  concentration of SARS-CoV-2 viral copies this assay can detect is 250  copies / mL. A negative result does not preclude SARS-CoV-2 infection  and should not be used as the sole basis for treatment or other  patient management decisions.  A negative result may occur with  improper specimen collection / handling, submission of specimen other  than nasopharyngeal swab, presence of viral mutation(s) within the  areas targeted by this assay, and inadequate number of viral copies  (<250 copies / mL). A negative result must be combined with clinical  observations, patient history, and epidemiological information. If result is POSITIVE SARS-CoV-2 target nucleic acids are DETECTED. The SARS-CoV-2 RNA is generally detectable in upper and lower  respiratory specimens dur ing the acute phase of infection.  Positive  results are indicative of active infection with SARS-CoV-2.  Clinical  correlation with patient history and  other diagnostic information is  necessary to determine patient infection status.  Positive results do  not rule out bacterial infection or co-infection with other viruses. If result is PRESUMPTIVE POSTIVE SARS-CoV-2 nucleic acids MAY BE PRESENT.   A presumptive positive result was obtained on the submitted specimen  and confirmed on repeat testing.  While 2019 novel coronavirus  (SARS-CoV-2) nucleic acids may be present in the submitted sample  additional confirmatory testing may be necessary for epidemiological  and / or clinical management purposes  to differentiate between  SARS-CoV-2 and other Sarbecovirus currently known to infect humans.  If clinically indicated additional testing with an alternate test  methodology [phone]) is advised. The  SARS-CoV-2 RNA is generally  detectable in upper and lower respiratory sp ecimens during the acute  phase of infection. The expected result is Negative. Fact Sheet for Patients:  StrictlyIdeas.no Fact Sheet for Healthcare Providers: BankingDealers.co.za This test is not yet approved or cleared by the [place] FDA and has been authorized for detection and/or diagnosis of SARS-CoV-2 by FDA under an Emergency Use Authorization (EUA).  This EUA will remain in effect (meaning this test can be used) for the duration of the COVID-19 declaration under Section 564(b)(1) of the Act, 21 U.S.C. section 360bbb-3(b)(1), unless the authorization is terminated or revoked sooner. Performed at [facility], [place] [address], [place], [place] [address]          Radiology Studies: No results found.      Scheduled Meds: . aspirin EC  81 mg Oral Daily  . atorvastatin  40 mg Oral q1800  . clopidogrel  75 mg Oral Daily  . enoxaparin (LOVENOX) injection  40 mg Subcutaneous Q24H  . lisinopril  40 mg Oral Daily   And  . hydrochlorothiazide  25 mg Oral Daily  . metoprolol tartrate  50 mg Oral BID   Continuous Infusions: . sodium chloride 0.9 % with KCl/Additives Pediatric custom IV fluid       LOS: 2 days    Time spent: 25 minutes      Barb Merino, MD [facility] Pager [phone]  If 7PM-7AM, please contact night-coverage [email or web address] Password [facility] 06/27/2018, 2:34 PM

## 2018-06-27 NOTE — Consult Note (Addendum)
ELECTROPHYSIOLOGY CONSULT NOTE  Patient ID: Leslie Walker MRN: [id number], DOB/AGE: 05-28-1969   Admit date: 06/24/2018 Date of Consult: 06/27/2018  Primary Physician: Elbert Ewings, [place] Primary Cardiologist: none Reason for Consultation: Cryptogenic stroke ; recommendations regarding Implantable Loop Recorder, requested by Dr. Leonie Man  History of Present Illness [name] was admitted on 06/24/2018 with stroke.  She first developed symptoms while at home PMHx noted only for HTN Imaging demonstratedSmall posterior R frontal lobe cortical and subcortical infarct embolic secondary to unknown source .  she has undergone workup for stroke including echocardiogram and carotid dopplers.  The patient has been monitored on telemetry which has demonstrated sinus rhythm with no arrhythmias.  Inpatient stroke work-up is to be completed with a TEE.   Echocardiogram this admission demonstrated    IMPRESSIONS    1. The left ventricle has normal systolic function, with an ejection fraction of 55-60%. The cavity size was normal. Left ventricular diastolic Doppler parameters are consistent with impaired relaxation. Basal inferior hypokinesis.  2. The right ventricle has normal systolic function. The cavity was normal. There is no increase in right ventricular wall thickness.  3. No evidence of mitral valve stenosis. Trivial mitral regurgitation.  4. The aortic valve is tricuspid. Moderate calcification of the aortic valve. Aortic valve regurgitation is moderate to severe by color flow Doppler. Mild stenosis of the aortic valve, mean gradient 16 mmHg with AVA 1.72 cm^2. Suspect most likely  nodular calcification of the aortic valve, but with history of TIA/CVA, would consider TEE to assess aortic insufficiency and rule out aortic valve vegetation.  5. The aortic root and ascending aorta are normal in size and structure.  6. The IVC was normal in size. No complete TR doppler jet so unable to estimate  PA systolic pressure.  7. Negative bubble study, no evidence for ASD or PFO.  FINDINGS  Left Ventricle: The left ventricle has normal systolic function, with an ejection fraction of 55-60%. The cavity size was normal. There is no increase in left ventricular wall thickness. Left ventricular diastolic Doppler parameters are consistent with  impaired relaxation.  Right Ventricle: The right ventricle has normal systolic function. The cavity was normal. There is no increase in right ventricular wall thickness.  Left Atrium: Left atrial size was normal in size.  Right Atrium: Right atrial size was normal in size. Right atrial pressure is estimated at 10 mmHg.  Interatrial Septum: No atrial level shunt detected by color flow Doppler. Agitated saline contrast was given intravenously to evaluate for intracardiac shunting. Saline contrast bubble study was negative, with no evidence of any interatrial shunt.  Pericardium: There is no evidence of pericardial effusion.  Mitral Valve: The mitral valve is normal in structure. Mitral valve regurgitation is trivial by color flow Doppler. No evidence of mitral valve stenosis.  Tricuspid Valve: The tricuspid valve is normal in structure. Tricuspid valve regurgitation is trivial by color flow Doppler.  Aortic Valve: The aortic valve is tricuspid Moderate calcification of the aortic valve. Aortic valve regurgitation is moderate to severe by color flow Doppler. There is Mild stenosis of the aortic valve, with a calculated valve area of 1.72 cm.  Pulmonic Valve: The pulmonic valve was normal in structure. Pulmonic valve regurgitation is not visualized by color flow Doppler.  Aorta: The aortic root and ascending aorta are normal in size and structure.  Venous: The inferior vena cava is normal in size with greater than 50% respiratory variability.    Lab work is reviewed.  Prior to admission, the patient denies chest pain, shortness of  breath, dizziness, palpitations, or syncope.  They are recovering from their stroke with plans to home at discharge.   Past Medical History:  Diagnosis Date   Hypertension      Surgical History: History reviewed. No pertinent surgical history.   Medications Prior to Admission  Medication Sig Dispense Refill Last Dose   lisinopril-hydrochlorothiazide (ZESTORETIC) 20-12.5 MG tablet Take 1 tablet by mouth daily.   06/24/2018 at 9:30am   metoprolol tartrate (LOPRESSOR) 25 MG tablet Take 25 mg by mouth 2 (two) times daily.   06/24/2018 at Unknown time    Inpatient Medications:   aspirin EC  81 mg Oral Daily   atorvastatin  40 mg Oral q1800   clopidogrel  75 mg Oral Daily   enoxaparin (LOVENOX) injection  40 mg Subcutaneous Q24H   lisinopril  40 mg Oral Daily   And   hydrochlorothiazide  25 mg Oral Daily   metoprolol tartrate  50 mg Oral BID   potassium chloride  40 mEq Oral Q2H    Allergies: No Known Allergies  Social History   Socioeconomic History   Marital status: Single    Spouse name: Not on file   Number of children: Not on file   Years of education: Not on file   Highest education level: Not on file  Occupational History   Not on file  Social Needs   Financial resource strain: Not on file   Food insecurity:    Worry: Not on file    Inability: Not on file   Transportation needs:    Medical: Not on file    Non-medical: Not on file  Tobacco Use   Smoking status: Current Every Day Smoker    Types: Cigars   Smokeless tobacco: Never Used  Substance and Sexual Activity   Alcohol use: No   Drug use: No    Comment: 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   Sexual activity: Yes  Lifestyle   Physical activity:    Days per week: 0 days    Minutes per session: 0 min   Stress: Not at all  Relationships   Social connections:    Talks on phone: More than three times a week    Gets together: Once a week    Attends religious service: 1 to 4 times per  year    Active member of club or organization: No    Attends meetings of clubs or organizations: Never    Relationship status: Never married   Intimate partner violence:    Fear of current or ex partner: No    Emotionally abused: No    Physically abused: No    Forced sexual activity: No  Other Topics Concern   Not on file  Social History Narrative   Not on file     Family History  Problem Relation Age of Onset   Hypertension Mother    Hypertension Father    Hypertension Brother       Review of Systems: All other systems reviewed and are otherwise negative except as noted above.  Physical Exam: Vitals:   06/27/18 0219 06/27/18 0457 06/27/18 0639 06/27/18 0856  BP: (!) 185/97 (!) 188/78 (!) 182/85 (!) 159/93  Pulse: 61 85 65 95  Resp: 18 16  18   Temp: 98.6 F (37 C) 99 F (37.2 C)  99.6 F (37.6 C)  TempSrc: Oral Oral  Oral  SpO2: 100% 100%  100%  Weight:  Height:       Limited visual exam given COVID19 pandemic GEN- The patient is thin, well appearing, alert and oriented x 3 today.   Head- normocephalic, atraumatic Eyes-  Sclera clear, conjunctiva pink Ears- hearing intact Neck- supple Lungs-  normal work of breathing Heart- telemetry and echo were reviewed Extremities- no clubbing, cyanosis, or edema MS- no significant deformity or atrophy Skin- no rash or lesion Psych- euthymic mood, full affect   Labs:   Lab Results  Component Value Date   WBC 7.0 06/24/2018   HGB 14.4 06/24/2018   HCT 41.0 06/24/2018   MCV 84.0 06/24/2018   PLT 227 06/24/2018    Recent Labs  Lab 06/24/18 1404 06/27/18 0739  NA 140 135  K 3.9 3.0*  CL 104 96*  CO2 29 25  BUN 12 14  CREATININE 0.79 0.74  CALCIUM 9.3 9.7  PROT 8.0  --   BILITOT 0.7  --   ALKPHOS 74  --   ALT 13  --   AST 19  --   GLUCOSE 108* 114*   No results found for: CKTOTAL, CKMB, CKMBINDEX, TROPONINI Lab Results  Component Value Date   CHOL 201 (H) 06/25/2018   Lab Results    Component Value Date   HDL 46 06/25/2018   Lab Results  Component Value Date   LDLCALC 127 (H) 06/25/2018   Lab Results  Component Value Date   TRIG 141 06/25/2018   Lab Results  Component Value Date   CHOLHDL 4.4 06/25/2018   No results found for: LDLDIRECT  No results found for: DDIMER   Radiology/Studies:   Ct Head Wo Contrast Result Date: 06/24/2018 CLINICAL DATA:  Left hand and face numbness upon awakening EXAM: CT HEAD WITHOUT CONTRAST TECHNIQUE: Contiguous axial images were obtained from the base of the skull through the vertex without intravenous contrast. COMPARISON:  None. FINDINGS: Brain: No evidence of acute infarction, hemorrhage, hydrocephalus, extra-axial collection or mass lesion/mass effect. Probable mild white matter disease with patchy low-density in the cerebral white matter. History of hypertension suggests this is chronic microvascular ischemia. Normal brain volume Vascular: No hyperdense vessel. There is atherosclerotic calcification. Skull: Normal. Negative for fracture or focal lesion. Sinuses/Orbits: No acute finding. IMPRESSION: 1. No acute finding.  No visible infarct. 2. Probable mild chronic small vessel ischemia. Electronically Signed   By: Monte Fantasia M.D.   On: 06/24/2018 15:10     Mr Jodene Nam Head Wo Contrast Result Date: 06/24/2018 CLINICAL DATA:  Left hand and mouth numbness that started yesterday EXAM: MRI HEAD WITHOUT CONTRAST MRA HEAD WITHOUT CONTRAST TECHNIQUE: Multiplanar, multiecho pulse sequences of the brain and surrounding structures were obtained without intravenous contrast. Angiographic images of the head were obtained using MRA technique without contrast. 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 Mild to moderate for age white matter disease, likely chronic small vessel ischemia given history of hypertension. Brain volume is  normal. No acute hemorrhage, hydrocephalus, masslike finding, or collection. Vascular: Arterial findings below. Skull and upper cervical spine: Negative for marrow lesion Sinuses/Orbits: Minor mucosal thickening in paranasal sinuses. MRA HEAD FINDINGS Symmetric carotid arteries and branching. No proximal flow limiting stenosis or branch occlusion. The vertebral and basilar arteries are smooth and widely patent. Fetal type PCA anatomy on the right. Negative for aneurysm or branch occlusion. There is mild atherosclerotic irregularity of bilateral PCA branches. There is asymmetric blurring  of proximal right MCA due to artifact, asymmetric due to head tilt. IMPRESSION: 1. 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 2. Mild to moderate white matter disease is likely chronic small vessel ischemia. 3. No emergent finding on intracranial MRA. There is mild atherosclerosis best seen in the posterior cerebral arteries. Electronically Signed   By: Monte Fantasia M.D.   On: 06/24/2018 18:22     Vas US Carotid (at [facility] Only) Result Date: 06/26/2018 Carotid Arterial Duplex Study Indications:       CVA and Numbness. Risk Factors:      Hypertension. Comparison Study:  No prior study on file for comparison. Performing Technologist: Sharion Dove RVS  Examination Guidelines: A complete evaluation includes B-mode imaging, spectral Doppler, color Doppler, and power Doppler as needed of all accessible portions of each vessel. Bilateral testing is considered an integral part of a complete examination. Limited examinations for reoccurring indications may be performed as noted.   Summary: Right Carotid: The extracranial vessels were near-normal with only minimal wall                thickening or plaque. Left Carotid: The extracranial vessels were near-normal with only minimal wall               thickening or plaque. Vertebrals:  Bilateral vertebral arteries demonstrate antegrade flow. Subclavians: Normal flow  hemodynamics were seen in bilateral subclavian              arteries. *See table(s) above for measurements and observations.  Electronically signed by Antony Contras MD on 06/26/2018 at 1:18:53 PM.    Final     12-lead ECG SR All prior EKG's in EPIC reviewed with no documented atrial fibrillation  Telemetry SR  Assessment and Plan:  1. Cryptogenic stroke The patient presents with cryptogenic stroke.  The patient has a TEE planned for this afternoon.  Dr Curt Bears spoke at length with the patient about monitoring for afib with either a 30 day event monitor or an implantable loop recorder.  Risks, benefits, and alteratives to implantable loop recorder were discussed with the patient today.   At this time, the patient is very clear in her decision to proceed with implantable loop recorder, pending  Her TEE and TCD findings.   Wound care was reviewed with the patient (keep incision clean and dry for 3 days).  Wound check Jermy Couper be scheduled for the patient  Please call with questions.   Baldwin Jamaica, PA-C 06/27/2018  I have seen and examined this patient with Tommye Standard.  Agree with above, note added to reflect my findings.  On exam, RRR, no murmurs, lungs clear.  Patient presented to the hospital with cryptogenic stroke. To date, no cause has been found. TEE planned for today. If unrevealing, Lynnley Doddridge plan for LINQ monitor to look for atrial fibrillation. Risks and benefits discussed. Risks include but not limited to bleeding and infection. The patient understands the risks and has agreed to the procedure.  Reighlynn Swiney M. Jaidin Richison MD 06/27/2018 12:54 PM

## 2018-06-27 NOTE — Progress Notes (Signed)
STROKE TEAM PROGRESS NOTE   INTERVAL HISTORY Patient was unsuccessful as she vomited during the procedure during 2 attempts.  Postponed for another day.  Vitals:   06/27/18 1127 06/27/18 1135 06/27/18 1245 06/27/18 1604  BP: (!) 204/115 (!) 194/90 130/79 (!) 143/98  Pulse: 65 73 87 99  Resp: (!) 26 (!) 26 20   Temp: 99.1 F (37.3 C)   100.1 F (37.8 C)  TempSrc: Temporal   Oral  SpO2: 99% 99% 99% 99%  Weight:      Height:        CBC:  Recent Labs  Lab 06/24/18 1404 06/27/18 0739  WBC 7.0 8.9  NEUTROABS  --  6.4  HGB 14.4 16.5*  HCT 41.0 RESULTS UNAVAILABLE DUE TO INTERFERING SUBSTANCE  MCV 84.0 RESULTS UNAVAILABLE DUE TO INTERFERING SUBSTANCE  PLT 227 595    Basic Metabolic Panel:  Recent Labs  Lab 06/24/18 1404 06/27/18 0739  NA 140 135  K 3.9 3.0*  CL 104 96*  CO2 29 25  GLUCOSE 108* 114*  BUN 12 14  CREATININE 0.79 0.74  CALCIUM 9.3 9.7  MG  --  2.0   Lipid Panel:     Component Value Date/Time   CHOL 201 (H) 06/25/2018 0355   TRIG 141 06/25/2018 0355   HDL 46 06/25/2018 0355   CHOLHDL 4.4 06/25/2018 0355   VLDL 28 06/25/2018 0355   LDLCALC 127 (H) 06/25/2018 0355   HgbA1c:  Lab Results  Component Value Date   HGBA1C 5.7 (H) 06/25/2018   Urine Drug Screen: No results found for: LABOPIA, COCAINSCRNUR, LABBENZ, AMPHETMU, THCU, LABBARB  Alcohol Level No results found for: ETH  IMAGING No results found.  PHYSICAL EXAM Pleasant frail middle-aged African-American lady not in distress. . Afebrile. Head is nontraumatic. Neck is supple without bruit.    Cardiac exam no murmur or gallop. Lungs are clear to auscultation. Distal pulses are well felt. Neurological Exam ;  Awake  Alert oriented x 3. Normal speech and language.eye movements full without nystagmus.fundi were not visualized. Vision acuity and fields appear normal. Hearing is normal. Palatal movements are normal. Face symmetric. Tongue midline. Normal strength, tone, reflexes and coordination.   Diminished fine finger movements on the left.  Orbits right over left upper extremity.  Mild left grip weakness.  Normal sensation. Gait deferred.  ASSESSMENT/PLAN [name] is a 53 y.o. female with history of HTN presenting with left upper extremity numbness and weakness and left facial numbness..   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 Mild Small vessel disease.   MRI  Small posterior R frontal lobe infarct. Mild Small vessel disease.   MRA  Mild atherosclerosis   Carotid Doppler  B ICA 1-39% stenosis, VAs antegrade   2D Echo EF 55-60%. Neg bubble. No source of embolus   TEE to look for embolic source. Arranged with [facility] for tomorrow.  If positive for PFO (patent foramen ovale), check bilateral lower extremity venous dopplers to rule out DVT as possible source of stroke. (I have made patient NPO after midnight tonight).  If TEE negative, a [place] electrophysiologist will consult and consider placement of an implantable loop recorder to evaluate for atrial fibrillation as etiology of stroke. This has been explained to patient/family by Dr. Leonie Man and they are agreeable.   TCD bubble pending   Hypercoagulable labs (Cardiolipin  antibodies, beta-2 glycoprotein, lupus anticoagulant, ANA, ESR, sickle cell, RPR) pending   LDL 127  HgbA1c 5.7  HIV neg  Lovenox 40 mg sq daily for VTE prophylaxis  No antithrombotic prior to admission, now on aspirin 325 mg daily. Given mild stroke, recommend aspirin 81 mg and plavix 75 mg daily x 3 weeks, then aspirin alone. Orders adjusted.   Therapy recommendations:  No PT or OT, intermittent supervision  Disposition:  Return home  Hypertension  Stable . Permissive hypertension (OK if < 220/120) but gradually normalize in 5-7 days . Long-term BP goal  normotensive  Hyperlipidemia  Home meds:  No statin  Now on Lipitor 40  LDL 127, goal < 70  Continue statin at discharge  Other Stroke Risk Factors  Cigar smoker, advised to stop smoking  Other Active Problems  Aortic regurgitation  Nausea on [facility] day # 2 I .  Recommend check TEE for cardiac source of embolism and loop recorder for paroxysmal A. fib.  Check transcranial Doppler bubble study for PFO.  Patient was counseled to quit smoking.  Dual antiplatelet therapy for 3 weeks followed by aspirin alone. .  Discussed with Dr. Syliva Overman, [place] Pager: [phone] 06/27/2018 5:20 PM   To contact Stroke Continuity provider, please refer to [email or web address] After hours, contact General Neurology

## 2018-06-27 NOTE — Interval H&P Note (Signed)
History and Physical Interval Note:  06/27/2018 10:33 AM  Leslie Walker  has presented today for surgery, with the diagnosis of STROKE.  The various methods of treatment have been discussed with the patient and family. After consideration of risks, benefits and other options for treatment, the patient has consented to  Procedure(s) with comments: TRANSESOPHAGEAL ECHOCARDIOGRAM (TEE (N/A) - PATIENT NEEDS LOOP as a surgical intervention.  The patient's history has been reviewed, patient examined, no change in status, stable for surgery.  I have reviewed the patient's chart and labs.  Questions were answered to the patient's satisfaction.     [name]

## 2018-06-27 NOTE — CV Procedure (Signed)
    Transesophageal Echocardiogram Note  Leslie Walker [id number] [birth year]  Procedure: Transesophageal Echocardiogram Indications: CVA   Procedure Details Consent: Obtained Time Out: Verified patient identification, verified procedure, site/side was marked, verified correct patient position, special equipment/implants available, Radiology Safety Procedures followed,  medications/allergies/relevent history reviewed, required imaging and test results available.  Performed  Medications:  During this procedure the patient is administered a total of Versed 3 mg and Fentanyl 50  mcg  to achieve and maintain moderate conscious sedation.  The patient's heart rate, blood pressure, and oxygen saturation are monitored continuously during the procedure. The period of conscious sedation is 30  minutes, of which I was present face-to-face 100% of this time.   When we passed the TEE probe, she had copious amount of green emesis.  I withdrew the probe,  Leslie Free RN suctioned her completely.   We called anesthesia ( Dr. Clide Cliff)  to review the situation. Anesthesia suggested we suction well and try again.  After she appeared stable, we passed the probe again . Once again, she had copious amount of emesis.  We removed the probe, suctioned her, sat her up and aborted the case . O2 sats remained stable.  No respiratory distress.  She will need to be rescheduled for another day      Complications: Complications of green emisis x 2 attempts resulting in stopping the procedure.  Patient did tolerate procedure well.   Thayer Headings, Brooke Bonito., MD, [facility] 06/27/2018, 11:09 AM

## 2018-06-27 NOTE — Discharge Instructions (Signed)
Implant site/wound care instructions °Keep incision clean and dry for 3 days. °You can remove outer dressing tomorrow. °Leave steri-strips (little pieces of tape) on until seen in the office for wound check appointment. °Call the office ([phone]) for redness, drainage, swelling, or fever. ° °

## 2018-06-28 ENCOUNTER — Encounter (HOSPITAL_COMMUNITY): Payer: Self-pay | Admitting: Cardiology

## 2018-06-28 ENCOUNTER — Inpatient Hospital Stay (HOSPITAL_COMMUNITY): Payer: Self-pay

## 2018-06-28 DIAGNOSIS — I639 Cerebral infarction, unspecified: Secondary | ICD-10-CM

## 2018-06-28 LAB — LUPUS ANTICOAGULANT
DRVVT: 27.6 s (ref 0.0–47.0)
PTT Lupus Anticoagulant: 28.3 s (ref 0.0–51.9)
Thrombin Time: 18.3 s (ref 0.0–23.0)
dPT Confirm Ratio: 0.92 Ratio (ref 0.00–1.40)
dPT: 35.1 s (ref 0.0–55.0)

## 2018-06-28 MED ORDER — SODIUM CHLORIDE 0.9 % IV BOLUS: 500.0000 mL | Freq: Once | INTRAVENOUS | Status: DC

## 2018-06-28 MED ORDER — SODIUM CHLORIDE 0.9 % IV BOLUS: 1000.0000 mL | Freq: Once | INTRAVENOUS | Status: DC

## 2018-06-28 MED ORDER — METOPROLOL TARTRATE 25 MG PO TABS
25.0000 mg | ORAL_TABLET | Freq: Two times a day (BID) | ORAL | Status: DC
  Administered 2018-06-29 – 2018-06-30 (×3): 25 mg via ORAL
  Filled 2018-06-28 (×4): qty 1

## 2018-06-28 MED ORDER — SODIUM CHLORIDE 0.9 % IV BOLUS
500.0000 mL | Freq: Once | INTRAVENOUS | Status: AC
  Administered 2018-06-28: 500 mL via INTRAVENOUS

## 2018-06-28 MED ORDER — HYDROCHLOROTHIAZIDE 12.5 MG PO CAPS: 12.5000 mg | ORAL_CAPSULE | Freq: Every day | ORAL | Status: DC

## 2018-06-28 MED ORDER — LISINOPRIL 20 MG PO TABS: 20.0000 mg | ORAL_TABLET | Freq: Every day | ORAL | Status: DC

## 2018-06-28 MED ORDER — ACYCLOVIR 5 % EX OINT
TOPICAL_OINTMENT | CUTANEOUS | Status: DC
  Administered 2018-06-28 – 2018-06-29 (×7): via TOPICAL
  Administered 2018-06-29: 1 via TOPICAL
  Administered 2018-06-29 (×3): via TOPICAL
  Administered 2018-06-29: 1 via TOPICAL
  Administered 2018-06-29: 18:00:00 via TOPICAL
  Administered 2018-06-30: 1 via TOPICAL
  Administered 2018-06-30 (×3): via TOPICAL
  Filled 2018-06-28: qty 15

## 2018-06-28 NOTE — Progress Notes (Signed)
PROGRESS NOTE    Leslie Walker  WUJ:[id number] DOB: 1965/06/09 DOA: 06/24/2018 PCP: Elbert Ewings, FNP   Brief Narrative: Patient is a 53 year old female with history of hypertension who presented to the emergency department with complaints of left facial numbness, left arm, hand numbness and weakness of one day duration.  CT head on presentation did not show any acute intracranial abnormalities.  MRI of the brain showed small acute infarct in posterior right frontal lobe.  MRA did not show any emergent findings. Neurology consulted.  She was transferred to [facility] from [facility] for neuro care. She developed some gastroenteritis and had bilious vomiting on attempt to do TEE.  Received loop recorder. 06/28/2018: She had episode of hypotension at night.  Was also on increased doses of blood pressure medications.  Assessment & Plan:   Principal Problem:   TIA (transient ischemic attack) Active Problems:   Essential hypertension  Acute ischemic stroke: MRI finding as above.  2D echocardiogram normal.Carotid duplex is normal with atherosclerosis. Hemoglobin A1c of 5.7.  LDL of 127.  Started on Lipitor 40 mg daily. Continue aspirin for now.  Seen by neurology.  Suggesting 3 weeks of aspirin and Plavix and to continue aspirin. Seen by PT OT and speech.  No further intervention needed. TEE canceled because of vomiting, scheduled for 06/29/2018 Loop recorder placed, 06/28/2018.  Hypotension: Episode of hypotension after prolonged n.p.o. and few episodes of diarrhea.  Improved with IV fluid resuscitation.  Decrease antihypertensives back to her home doses.  Continue maintenance IV fluid. She has normal bowel function now.  No nausea vomiting.  Will allow regular diet.  Keep n.p.o. past midnight.  If at all with no GI symptoms, discussed with cardiology to schedule for TEE.  Aortic regurgitation: Echocardiogram showed normal systolic function with ejection fraction of 55 to 60%, impaired relaxation, basal  inferior hypokinesis.  Also showed   moderate to severe aortic valve regurgitation, nodular calcification of the aortic valve.  Undergoing TEE tomorrow.  Hypertension: Accelerated.  Hypotensive on increasing dose of blood pressure medications.  Put back to normal doses.    Smoker: Counseled to quit.  Motivated and ready to quit.   DVT prophylaxis: Lovenox Code Status: Full Family Communication: Communicated the patient. Disposition Plan: Likely home tomorrow after TEE procedure.   Consultants: Neurology  Procedures: MRI of the brain  Antimicrobials:  Anti-infectives (From admission, onward)   None      Subjective: Patient seen and examined.  Overnight events noted.  She had low blood pressure and felt somewhat dizzy.  Responded to 2 L of IV fluids.  Denies any nausea vomiting.  She was upset about not having procedure today and is staying n.p.o. all morning.  No diarrhea.  Denies any abdominal pain. Objective: Vitals:   06/28/18 0422 06/28/18 0500 06/28/18 0831 06/28/18 1100  BP: (!) 88/57 (!) 98/57 (!) 100/56 100/61  Pulse: 66  63 (!) 58  Resp: 13  18 18   Temp: 98.1 F (36.7 C)  98.7 F (37.1 C) 98.5 F (36.9 C)  TempSrc: Oral  Oral Oral  SpO2: 100%  100% 99%  Weight:      Height:        Intake/Output Summary (Last 24 hours) at 06/28/2018 1513 Last data filed at 06/27/2018 1820 Gross per 24 hour  Intake 296.32 ml  Output -  Net 296.32 ml   Filed Weights   06/24/18 1332  Weight: 69.9 kg    Examination:  General exam: Appears calm and comfortable ,Not in  distress,average built HEENT:PERRL,Oral mucosa moist, Ear/Nose normal on gross exam Respiratory system: Bilateral equal air entry, normal vesicular breath sounds, no wheezes or crackles  Cardiovascular system: S1 & S2 heard, RRR.Marland Kitchen No pedal edema. Gastrointestinal system: Abdomen is nondistended, soft and nontender. No organomegaly or masses felt. Normal bowel sounds heard. Central nervous system: Alert and  oriented.  Cranial nerves II to XII normal. Motor examination left hand grip 4+ which is slightly less strength than right side.  Other neurological examination normal. Extremities: No edema, no clubbing ,no cyanosis, distal peripheral pulses palpable. Skin: No rashes, lesions or ulcers,no icterus ,no pallor MSK: Normal muscle bulk,tone ,power Psychiatry: Judgement and insight appear normal. Mood & affect appropriate.     Data Reviewed: I have personally reviewed following labs and imaging studies  CBC: Recent Labs  Lab 06/24/18 1404 06/27/18 0739  WBC 7.0 8.9  NEUTROABS  --  6.4  HGB 14.4 16.5*  HCT 41.0 RESULTS UNAVAILABLE DUE TO INTERFERING SUBSTANCE  MCV 84.0 RESULTS UNAVAILABLE DUE TO INTERFERING SUBSTANCE  PLT 227 008   Basic Metabolic Panel: Recent Labs  Lab 06/24/18 1404 06/27/18 0739  NA 140 135  K 3.9 3.0*  CL 104 96*  CO2 29 25  GLUCOSE 108* 114*  BUN 12 14  CREATININE 0.79 0.74  CALCIUM 9.3 9.7  MG  --  2.0   GFR: Estimated Creatinine Clearance: 78.1 mL/min (by C-G formula based on SCr of 0.74 mg/dL). Liver Function Tests: Recent Labs  Lab 06/24/18 1404  AST 19  ALT 13  ALKPHOS 74  BILITOT 0.7  PROT 8.0  ALBUMIN 4.2   No results for input(s): LIPASE, AMYLASE in the last 168 hours. No results for input(s): AMMONIA in the last 168 hours. Coagulation Profile: Recent Labs  Lab 06/24/18 1404  INR 0.9   Cardiac Enzymes: No results for input(s): CKTOTAL, CKMB, CKMBINDEX, TROPONINI in the last 168 hours. BNP (last 3 results) No results for input(s): PROBNP in the last 8760 hours. HbA1C: No results for input(s): HGBA1C in the last 72 hours. CBG: No results for input(s): GLUCAP in the last 168 hours. Lipid Profile: No results for input(s): CHOL, HDL, LDLCALC, TRIG, CHOLHDL, LDLDIRECT in the last 72 hours. Thyroid Function Tests: No results for input(s): TSH, T4TOTAL, FREET4, T3FREE, THYROIDAB in the last 72 hours. Anemia Panel: No results for  input(s): VITAMINB12, FOLATE, FERRITIN, TIBC, IRON, RETICCTPCT in the last 72 hours. Sepsis Labs: No results for input(s): PROCALCITON, LATICACIDVEN in the last 168 hours.  Recent Results (from the past 240 hour(s))  SARS Coronavirus 2 (CEPHEID - Performed in [place] hospital lab), Hosp Order     Status: None   Collection Time: 06/24/18  1:54 PM   Specimen: Nasopharyngeal Swab  Result Value Ref Range Status   SARS Coronavirus 2 NEGATIVE NEGATIVE Final    Comment: (NOTE) If result is NEGATIVE SARS-CoV-2 target nucleic acids are NOT DETECTED. The SARS-CoV-2 RNA is generally detectable in upper and lower  respiratory specimens during the acute phase of infection. The lowest  concentration of SARS-CoV-2 viral copies this assay can detect is 250  copies / mL. A negative result does not preclude SARS-CoV-2 infection  and should not be used as the sole basis for treatment or other  patient management decisions.  A negative result may occur with  improper specimen collection / handling, submission of specimen other  than nasopharyngeal swab, presence of viral mutation(s) within the  areas targeted by this assay, and inadequate number of viral  copies  (<250 copies / mL). A negative result must be combined with clinical  observations, patient history, and epidemiological information. If result is POSITIVE SARS-CoV-2 target nucleic acids are DETECTED. The SARS-CoV-2 RNA is generally detectable in upper and lower  respiratory specimens dur ing the acute phase of infection.  Positive  results are indicative of active infection with SARS-CoV-2.  Clinical  correlation with patient history and other diagnostic information is  necessary to determine patient infection status.  Positive results do  not rule out bacterial infection or co-infection with other viruses. If result is PRESUMPTIVE POSTIVE SARS-CoV-2 nucleic acids MAY BE PRESENT.   A presumptive positive result was obtained on the  submitted specimen  and confirmed on repeat testing.  While 2019 novel coronavirus  (SARS-CoV-2) nucleic acids may be present in the submitted sample  additional confirmatory testing may be necessary for epidemiological  and / or clinical management purposes  to differentiate between  SARS-CoV-2 and other Sarbecovirus currently known to infect humans.  If clinically indicated additional testing with an alternate test  methodology [phone]) is advised. The SARS-CoV-2 RNA is generally  detectable in upper and lower respiratory sp ecimens during the acute  phase of infection. The expected result is Negative. Fact Sheet for Patients:  StrictlyIdeas.no Fact Sheet for Healthcare Providers: BankingDealers.co.za This test is not yet approved or cleared by the [place] FDA and has been authorized for detection and/or diagnosis of SARS-CoV-2 by FDA under an Emergency Use Authorization (EUA).  This EUA will remain in effect (meaning this test can be used) for the duration of the COVID-19 declaration under Section 564(b)(1) of the Act, 21 U.S.C. section 360bbb-3(b)(1), unless the authorization is terminated or revoked sooner. Performed at [facility], [place] [address] [place], [place] [address]          Radiology Studies: No results found.      Scheduled Meds: . acyclovir ointment   Topical Q3H  . aspirin EC  81 mg Oral Daily  . atorvastatin  40 mg Oral q1800  . clopidogrel  75 mg Oral Daily  . enoxaparin (LOVENOX) injection  40 mg Subcutaneous Q24H  . lisinopril  20 mg Oral Daily   And  . hydrochlorothiazide  12.5 mg Oral Daily  . metoprolol tartrate  25 mg Oral BID   Continuous Infusions: . sodium chloride 0.9 % with KCl/Additives Pediatric custom IV fluid 100 mL/hr at 06/28/18 0727  . sodium chloride    . sodium chloride       LOS: 3 days    Time spent: 25 minutes      Barb Merino, MD [facility] Pager [phone]  If 7PM-7AM, please contact night-coverage [email or web address] Password [email or web address] 06/28/2018, 3:13 PM

## 2018-06-28 NOTE — Progress Notes (Signed)
STROKE TEAM PROGRESS NOTE   INTERVAL HISTORY Patient today.  She is neurologically stable with no complaints Vitals:   06/28/18 0422 06/28/18 0500 06/28/18 0831 06/28/18 1100  BP: (!) 88/57 (!) 98/57 (!) 100/56 100/61  Pulse: 66  63 (!) 58  Resp: _0 Temp: 98.1 F (36.7 C)  98.7 F (37.1 C) 98.5 F (36.9 C)  TempSrc: Oral  Oral Oral  SpO2: 100%  100% 99%  Weight:      Height:        CBC:  Recent Labs  Lab 06/24/18 1404 06/27/18 0739  WBC 7.0 8.9  NEUTROABS  --  6.4  HGB 14.4 16.5*  HCT 41.0 RESULTS UNAVAILABLE DUE TO INTERFERING SUBSTANCE  MCV 84.0 RESULTS UNAVAILABLE DUE TO INTERFERING SUBSTANCE  PLT 227 161    Basic Metabolic Panel:  Recent Labs  Lab 06/24/18 1404 06/27/18 0739  NA 140 135  K 3.9 3.0*  CL 104 96*  CO2 29 25  GLUCOSE 108* 114*  BUN 12 14  CREATININE 0.79 0.74  CALCIUM 9.3 9.7  MG  --  2.0   Lipid Panel:     Component Value Date/Time   CHOL 201 (H) 06/25/2018 0355   TRIG 141 06/25/2018 0355   HDL 46 06/25/2018 0355   CHOLHDL 4.4 06/25/2018 0355   VLDL 28 06/25/2018 0355   LDLCALC 127 (H) 06/25/2018 0355   HgbA1c:  Lab Results  Component Value Date   HGBA1C 5.7 (H) 06/25/2018   Urine Drug Screen: No results found for: LABOPIA, COCAINSCRNUR, LABBENZ, AMPHETMU, THCU, LABBARB  Alcohol Level No results found for: ETH  IMAGING No results found.  PHYSICAL EXAM Pleasant frail middle-aged African-American lady not in distress. . Afebrile. Head is nontraumatic. Neck is supple without bruit.    Cardiac exam no murmur or gallop. Lungs are clear to auscultation. Distal pulses are well felt. Neurological Exam ;  Awake  Alert oriented x 3. Normal speech and language.eye movements full without nystagmus.fundi were not visualized. Vision acuity and fields appear normal. Hearing is normal. Palatal movements are normal. Face symmetric. Tongue midline. Normal strength, tone, reflexes and coordination.  Diminished fine finger movements on the  left.  Orbits right over left upper extremity.  Mild left grip weakness.  Normal sensation. Gait deferred.  ASSESSMENT/PLAN [name] is a 53 y.o. female with history of HTN presenting with left upper extremity numbness and weakness and left facial numbness..   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 Mild Small vessel disease.   MRI  Small posterior R frontal lobe infarct. Mild Small vessel disease.   MRA  Mild atherosclerosis   Carotid Doppler  B ICA 1-39% stenosis, VAs antegrade   2D Echo EF 55-60%. Neg bubble. No source of embolus   TEE to look for embolic source. Arranged with [place] for tomorrow.  If positive for PFO (patent foramen ovale), check bilateral lower extremity venous dopplers to rule out DVT as possible source of stroke. (I have made patient NPO after midnight tonight).  If TEE negative, a [place] electrophysiologist will consult and consider placement of an implantable loop recorder to evaluate for atrial fibrillation as etiology of stroke. This has been explained to patient/family by Dr. Leonie Man and they are agreeable.   TCD bubble pending   Hypercoagulable labs (Cardiolipin antibodies, beta-2 glycoprotein, lupus anticoagulant, ANA,  ESR, sickle cell, RPR) pending   LDL 127  HgbA1c 5.7  HIV neg  Lovenox 40 mg sq daily for VTE prophylaxis  No antithrombotic prior to admission, now on aspirin 325 mg daily. Given mild stroke, recommend aspirin 81 mg and plavix 75 mg daily x 3 weeks, then aspirin alone. Orders adjusted.   Therapy recommendations:  No PT or OT, intermittent supervision  Disposition:  Return home  Hypertension  Stable . Permissive hypertension (OK if < 220/120) but gradually normalize in 5-7 days . Long-term BP goal normotensive  Hyperlipidemia  Home meds:  No statin  Now on Lipitor  40  LDL 127, goal < 70  Continue statin at discharge  Other Stroke Risk Factors  Cigar smoker, advised to stop smoking  Other Active Problems  Aortic regurgitation  Nausea on [facility] day # 3 I .  Recommend check TEE for cardiac source of embolism and TCD bubble study for PFO.  Patient was counseled to quit smoking.  Dual antiplatelet therapy for 3 weeks followed by aspirin alone. .  Discussed with Dr. Syliva Overman, MD Medical Director [place] Pager: [phone] 06/28/2018 1:00 PM   To contact Stroke Continuity provider, please refer to [email or web address] After hours, contact General Neurology

## 2018-06-28 NOTE — Progress Notes (Addendum)
Notified provider on call of current BP 79/48 map 58  New orders received from provider  RN will continue to monitor patient

## 2018-06-28 NOTE — Plan of Care (Signed)
Patient stable, discussed POC with patient, agreeable with plan, denies question/concerns at this time.  

## 2018-06-28 NOTE — Progress Notes (Addendum)
Notified provider on call of current BP, patient states that she feels okay however patient is drowsy.  Patient was given items that was left for her earlier in the shift that her husband left for her.  Patient advised that she has not took any of home medications or anything.  RN will continue to monitor patient   New orders received from provider      06/28/18 0253  Vitals  BP (!) 75/54  MAP (mmHg) (!) 62  BP Location Left Arm  BP Method Automatic  Patient Position (if appropriate) Lying  MEWS Score  MEWS RR 0  MEWS Pulse 0  MEWS Systolic 2  MEWS LOC 0  MEWS Temp 0  MEWS Score 2  MEWS Score Color Yellow

## 2018-06-28 NOTE — Progress Notes (Signed)
TCD bubble study has been completed.   Preliminary results in CV Proc.   Abram Sander 06/28/2018 3:50 PM

## 2018-06-29 ENCOUNTER — Inpatient Hospital Stay (HOSPITAL_COMMUNITY): Payer: Self-pay | Admitting: Certified Registered Nurse Anesthetist

## 2018-06-29 ENCOUNTER — Encounter (HOSPITAL_COMMUNITY)
Admission: EM | Disposition: A | Discharge status: Home / Self Care | Payer: Self-pay | Source: Home / Self Care | Attending: Internal Medicine

## 2018-06-29 ENCOUNTER — Inpatient Hospital Stay (HOSPITAL_COMMUNITY): Payer: Self-pay

## 2018-06-29 DIAGNOSIS — F1120 Opioid dependence, uncomplicated: Secondary | ICD-10-CM | POA: Diagnosis present

## 2018-06-29 LAB — CBC WITH DIFFERENTIAL/PLATELET
Abs Immature Granulocytes: 0.01 10*3/uL (ref 0.00–0.07)
Basophils Absolute: 0.1 10*3/uL (ref 0.0–0.1)
Basophils Relative: 1 %
Eosinophils Absolute: 0 10*3/uL (ref 0.0–0.5)
Eosinophils Relative: 1 %
HCT: 37.3 % (ref 36.0–46.0)
Hemoglobin: 13.7 g/dL (ref 12.0–15.0)
Immature Granulocytes: 0 %
Lymphocytes Relative: 46 %
Lymphs Abs: 2.4 10*3/uL (ref 0.7–4.0)
MCH: 29.4 pg (ref 26.0–34.0)
MCHC: 36.7 g/dL — ABNORMAL HIGH (ref 30.0–36.0)
MCV: 80 fL (ref 80.0–100.0)
Monocytes Absolute: 0.4 10*3/uL (ref 0.1–1.0)
Monocytes Relative: 8 %
Neutro Abs: 2.4 10*3/uL (ref 1.7–7.7)
Neutrophils Relative %: 44 %
Platelets: 201 10*3/uL (ref 150–400)
RBC: 4.66 MIL/uL (ref 3.87–5.11)
RDW: 12.9 % (ref 11.5–15.5)
WBC: 5.4 10*3/uL (ref 4.0–10.5)
nRBC: 0 % (ref 0.0–0.2)

## 2018-06-29 LAB — BASIC METABOLIC PANEL
Anion gap: 8 (ref 5–15)
BUN: 21 mg/dL — ABNORMAL HIGH (ref 6–20)
CO2: 25 mmol/L (ref 22–32)
Calcium: 8.8 mg/dL — ABNORMAL LOW (ref 8.9–10.3)
Chloride: 106 mmol/L (ref 98–111)
Creatinine, Ser: 0.95 mg/dL (ref 0.44–1.00)
GFR calc Af Amer: >60 mL/min (ref >60)
GFR calc non Af Amer: >60 mL/min (ref >60)
Glucose, Bld: 99 mg/dL (ref 70–99)
Potassium: 3.8 mmol/L (ref 3.5–5.1)
Sodium: 139 mmol/L (ref 135–145)

## 2018-06-29 LAB — MAGNESIUM: Magnesium: 2.1 mg/dL (ref 1.7–2.4)

## 2018-06-29 SURGERY — CANCELLED PROCEDURE
Anesthesia: Monitor Anesthesia Care

## 2018-06-29 MED ORDER — CLONIDINE HCL 0.1 MG PO TABS
0.1000 mg | ORAL_TABLET | Freq: Four times a day (QID) | ORAL | Status: DC
  Administered 2018-06-29 – 2018-06-30 (×4): 0.1 mg via ORAL
  Filled 2018-06-29 (×4): qty 1

## 2018-06-29 MED ORDER — NAPROXEN 250 MG PO TABS: 500.0000 mg | ORAL_TABLET | Freq: Two times a day (BID) | ORAL | Status: DC | PRN

## 2018-06-29 MED ORDER — PANTOPRAZOLE SODIUM 40 MG IV SOLR
40.0000 mg | Freq: Two times a day (BID) | INTRAVENOUS | Status: DC
  Administered 2018-06-29 – 2018-06-30 (×3): 40 mg via INTRAVENOUS
  Filled 2018-06-29 (×3): qty 40

## 2018-06-29 MED ORDER — CLONIDINE HCL 0.1 MG PO TABS: 0.1000 mg | ORAL_TABLET | ORAL | Status: DC

## 2018-06-29 MED ORDER — LIDOCAINE VISCOUS HCL 2 % MT SOLN
15.0000 mL | Freq: Once | OROMUCOSAL | Status: AC
  Administered 2018-06-29: 09:00:00 15 mL via ORAL
  Filled 2018-06-29: qty 15

## 2018-06-29 MED ORDER — SODIUM CHLORIDE 0.9% FLUSH
10.0000 mL | Freq: Two times a day (BID) | INTRAVENOUS | Status: DC
  Administered 2018-06-29 – 2018-06-30 (×3): 10 mL

## 2018-06-29 MED ORDER — ALUM & MAG HYDROXIDE-SIMETH 200-200-20 MG/5ML PO SUSP
30.0000 mL | Freq: Once | ORAL | Status: AC
  Administered 2018-06-29: 30 mL via ORAL
  Filled 2018-06-29: qty 30

## 2018-06-29 MED ORDER — DICYCLOMINE HCL 20 MG PO TABS
20.0000 mg | ORAL_TABLET | Freq: Four times a day (QID) | ORAL | Status: DC | PRN
  Administered 2018-06-30: 20 mg via ORAL
  Filled 2018-06-29: qty 1

## 2018-06-29 MED ORDER — HYDROXYZINE HCL 25 MG PO TABS
25.0000 mg | ORAL_TABLET | Freq: Four times a day (QID) | ORAL | Status: DC | PRN
  Administered 2018-06-29 – 2018-06-30 (×3): 25 mg via ORAL
  Filled 2018-06-29 (×3): qty 1

## 2018-06-29 MED ORDER — LOPERAMIDE HCL 2 MG PO CAPS: 2.0000 mg | ORAL_CAPSULE | ORAL | Status: DC | PRN

## 2018-06-29 MED ORDER — SODIUM CHLORIDE 0.9% FLUSH: 10.0000 mL | INTRAVENOUS | Status: DC | PRN

## 2018-06-29 MED ORDER — MORPHINE SULFATE (PF) 2 MG/ML IV SOLN
2.0000 mg | Freq: Once | INTRAVENOUS | Status: AC
  Administered 2018-06-29: 2 mg via INTRAVENOUS
  Filled 2018-06-29: qty 1

## 2018-06-29 MED ORDER — CLONIDINE HCL 0.1 MG PO TABS: 0.1000 mg | ORAL_TABLET | Freq: Every day | ORAL | Status: DC

## 2018-06-29 MED ORDER — PANTOPRAZOLE SODIUM 40 MG PO TBEC
40.0000 mg | DELAYED_RELEASE_TABLET | Freq: Every day | ORAL | Status: DC
  Filled 2018-06-29: qty 1

## 2018-06-29 MED ORDER — ONDANSETRON 4 MG PO TBDP
4.0000 mg | ORAL_TABLET | Freq: Four times a day (QID) | ORAL | Status: DC | PRN
  Administered 2018-06-29: 4 mg via ORAL
  Filled 2018-06-29 (×2): qty 1

## 2018-06-29 MED ORDER — METHOCARBAMOL 500 MG PO TABS
500.0000 mg | ORAL_TABLET | Freq: Three times a day (TID) | ORAL | Status: DC | PRN
  Filled 2018-06-29: qty 1

## 2018-06-29 MED ORDER — IOHEXOL 300 MG/ML  SOLN
100.0000 mL | Freq: Once | INTRAMUSCULAR | Status: AC | PRN
  Administered 2018-06-29: 100 mL via INTRAVENOUS

## 2018-06-29 NOTE — Progress Notes (Addendum)
STROKE TEAM PROGRESS NOTE   INTERVAL HISTORY Patient seems t obe doing well..  She is neurologically stable with no complaints.  She developed vomiting and head CT was canceled today as a care and scheduled for today but the same thing happen and hence it was not done.  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  Urine drug screen has not yet been sent Vitals:   06/28/18 2138 06/28/18 2327 06/29/18 0354 06/29/18 0831  BP: (!) 100/58 105/61 (!) 150/73 (!) 179/92  Pulse: (!) 54 (!) 57 61 69  Resp: _0 Temp: 98 F (36.7 C) 98.2 F (36.8 C) 98.2 F (36.8 C) (!) 97.5 F (36.4 C)  TempSrc: Oral Oral Oral Oral  SpO2: 100% 97% 100% 97%  Weight:      Height:        CBC:  Recent Labs  Lab 06/27/18 0739 06/29/18 0832  WBC 8.9 5.4  NEUTROABS 6.4 2.4  HGB 16.5* 13.7  HCT RESULTS UNAVAILABLE DUE TO INTERFERING SUBSTANCE 37.3  MCV RESULTS UNAVAILABLE DUE TO INTERFERING SUBSTANCE 80.0  PLT 241 300    Basic Metabolic Panel:  Recent Labs  Lab 06/27/18 0739 06/29/18 0832  NA 135 139  K 3.0* 3.8  CL 96* 106  CO2 25 25  GLUCOSE 114* 99  BUN 14 21*  CREATININE 0.74 0.95  CALCIUM 9.7 8.8*  MG 2.0 2.1   Lipid Panel:     Component Value Date/Time   CHOL 201 (H) 06/25/2018 0355   TRIG 141 06/25/2018 0355   HDL 46 06/25/2018 0355   CHOLHDL 4.4 06/25/2018 0355   VLDL 28 06/25/2018 0355   LDLCALC 127 (H) 06/25/2018 0355   HgbA1c:  Lab Results  Component Value Date   HGBA1C 5.7 (H) 06/25/2018   Urine Drug Screen: No results found for: LABOPIA, COCAINSCRNUR, LABBENZ, AMPHETMU, THCU, LABBARB  Alcohol Level No results found for: [facility]  IMAGING Vas [place] Transcranial Doppler W Bubbles  Result Date: 06/29/2018  Transcranial Doppler with Bubble Indications: Stroke. Performing Technologist: Abram Sander RVS  Examination Guidelines: A complete evaluation includes B-mode imaging, spectral Doppler, color Doppler, and power Doppler as needed of all accessible  portions of each vessel. Bilateral testing is considered an integral part of a complete examination. Limited examinations for reoccurring indications may be performed as noted.  Summary:  A vascular evaluation was performed. The left Opthalmic artery was studied. An IV was inserted into the patient's right Forearm. Verbal informed consent was obtained.  No HITS heard during valsalva or at rest. Slightly suboptimal study as only orbital window could be used but Negative TCD Bubble study *See table(s) above for measurements and observations.  Diagnosing physician: Leslie Contras MD Electronically signed by Leslie Contras MD on 06/29/2018 at 10:50:04 AM.    Final     PHYSICAL EXAM Pleasant frail middle-aged African-American lady not in distress. . Afebrile. Head is nontraumatic. Neck is supple without bruit.    Cardiac exam no murmur or gallop. Lungs are clear to auscultation. Distal pulses are well felt. Neurological Exam ;  Awake  Alert oriented x 3. Normal speech and language.eye movements full without nystagmus.fundi were not visualized. Vision acuity and fields appear normal. Hearing is normal. Palatal movements are normal. Face symmetric. Tongue midline. Normal strength, tone, reflexes and coordination.  Diminished fine finger movements on the left.  Orbits right over left upper extremity.  Mild left grip weakness.  Normal sensation. Gait deferred.  ASSESSMENT/PLAN  Leslie Walker is a 53 y.o. female with history of HTN presenting with left upper extremity numbness and weakness and left facial numbness..   Stroke:   Small posterior R frontal lobe cortical and subcortical infarct embolic secondary to unknown source-possibly heroin use low observe this premature gentleman Leslie Walker  CT head No acute stroke. Mild Small vessel disease.   MRI  Small posterior R frontal lobe infarct. Mild Small vessel disease.   MRA  Mild atherosclerosis   Carotid Doppler  B ICA 1-39% stenosis, VAs antegrade   2D  Echo EF 55-60%. Neg bubble. No source of embolus   TEE to look for embolic source. Arranged with [place] for tomorrow.  If positive for PFO (patent foramen ovale), check bilateral lower extremity venous dopplers to rule out DVT as possible source of stroke. (I have made patient NPO after midnight tonight).  If TEE negative, a [place] electrophysiologist will consult and consider placement of an implantable loop recorder to evaluate for atrial fibrillation as etiology of stroke. This has been explained to patient/family by Leslie Walker and they are agreeable.   TCD bubble negative but suboptimal study  Hypercoagulable labs (Cardiolipin antibodies, beta-2 glycoprotein, lupus anticoagulant, ANA, ESR, sickle cell, RPR)  All negative  LDL 127  HgbA1c 5.7  HIV neg  Lovenox 40 mg sq daily for VTE prophylaxis  No antithrombotic prior to admission, now on aspirin 325 mg daily. Given mild stroke, recommend aspirin 81 mg and plavix 75 mg daily x 3 weeks, then aspirin alone. Orders adjusted.   Therapy recommendations:  No PT or OT, intermittent supervision  Disposition:  Return home  Hypertension  Stable . Permissive hypertension (OK if < 220/120) but gradually normalize in 5-7 days . Long-term BP goal normotensive  Hyperlipidemia  Home meds:  No statin  Now on Lipitor 40  LDL 127, goal < 70  Continue statin at discharge  Other Stroke Risk Factors  Cigar smoker, advised to stop smoking  Other Active Problems  Aortic regurgitation  Nausea on [facility] day # 4 I .  Recommend cancel TEE as she may be withdrawing from heroin and drugs.  Patient was counseled to quit smoking.  Dual antiplatelet therapy for 3 weeks followed by aspirin alone. .  Discussed with Dr. Dante Gang Follow up as outpt in stroke clinic  Leslie Contras, MD Medical Director [place] Pager: [phone] 06/29/2018 1:07 PM   To contact  Stroke Continuity provider, please refer to [email or web address] After hours, contact General Neurology

## 2018-06-29 NOTE — Anesthesia Preprocedure Evaluation (Deleted)
Anesthesia Evaluation  Patient identified by MRN, date of birth, ID band Patient awake    Reviewed: Allergy & Precautions, NPO status , Patient's Chart, lab work & pertinent test results  Airway Mallampati: II  TM Distance: >3 FB Neck ROM: Full    Dental no notable dental hx.    Pulmonary Current Smoker,    Pulmonary exam normal breath sounds clear to auscultation       Cardiovascular hypertension, Pt. on medications + Valvular Problems/Murmurs AI  Rhythm:Regular Rate:Normal + Systolic murmurs    Neuro/Psych TIAnegative psych ROS   GI/Hepatic negative GI ROS, Neg liver ROS,   Endo/Other  negative endocrine ROS  Renal/GU negative Renal ROS  negative genitourinary   Musculoskeletal negative musculoskeletal ROS (+)   Abdominal   Peds negative pediatric ROS (+)  Hematology negative hematology ROS (+)   Anesthesia Other Findings   Reproductive/Obstetrics negative OB ROS                             Anesthesia Physical Anesthesia Plan  ASA: III  Anesthesia Plan: MAC   Post-op Pain Management:    Induction: Intravenous  PONV Risk Score and Plan: 0  Airway Management Planned: Simple Face Mask  Additional Equipment:   Intra-op Plan:   Post-operative Plan:   Informed Consent: I have reviewed the patients History and Physical, chart, labs and discussed the procedure including the risks, benefits and alternatives for the proposed anesthesia with the patient or authorized representative who has indicated his/her understanding and acceptance.     Dental advisory given  Plan Discussed with: CRNA and Surgeon  Anesthesia Plan Comments:         Anesthesia Quick Evaluation

## 2018-06-29 NOTE — Progress Notes (Signed)
PROGRESS NOTE    Leslie Walker  OFB:[id number] DOB: 1965/07/22 DOA: 06/24/2018 PCP: Elbert Ewings, FNP   Brief Narrative: Patient is a 53 year old female with history of hypertension who presented to the emergency department with complaints of left facial numbness, left arm, hand numbness and weakness of one day duration.  CT head on presentation did not show any acute intracranial abnormalities.  MRI of the brain showed small acute infarct in posterior right frontal lobe.  MRA did not show any emergent findings. Neurology consulted.  She was transferred to [facility] from [facility] for neuro care. She developed some gastroenteritis and had bilious vomiting on attempt to do TEE.  Received loop recorder. 06/28/2018: She had episode of hypotension at night.  Was also on increased doses of blood pressure medications.  Improved with IV fluids and symptomatic treatment. 06/29/2018: Reschedule for TEE, however patient is started having epigastric abdominal pain, nausea and vomiting as well as some loose watery stool.  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 Now her symptoms look most likely related to opiate withdrawal.  Patient is currently fairly stable.  Will start patient on opiate withdrawal protocol.  Assessment & Plan:   Principal Problem:   TIA (transient ischemic attack) Active Problems:   Essential hypertension  Acute ischemic stroke: MRI finding as above.  2D echocardiogram normal. Carotid duplex is normal with atherosclerosis. Hemoglobin A1c of 5.7.  LDL of 127.  Started on Lipitor 40 mg daily. Continue aspirin for now.  Seen by neurology.  Suggesting 3 weeks of aspirin and Plavix and to continue aspirin. Seen by PT OT and speech.  No further intervention needed. TEE canceled because of vomiting, scheduled for 06/29/2018, she will not tolerate.  Will discontinue TEE. She had  transthoracic echo with bubble protocol, no PFO seen. Loop recorder placed, 06/28/2018.  Gastroenteritis-like symptoms, nausea vomiting and epigastric abdominal pain: Likely heroin withdrawal. Intermittent symptoms.  Currently hemodynamically stable.  Fluctuating blood pressures. Start patient on Protonix. We will put patient on clonidine withdrawal protocol with tapering dose.  Antispasmodic, muscle relaxants.  Discontinue other antihypertensives as starting on clonidine. We will do a CT scan of the abdomen pelvis today to rule out any major intra-abdominal pathology.  Otherwise treat for gastritis and opiate withdrawal.  Aortic regurgitation: Echocardiogram showed normal systolic function with ejection fraction of 55 to 60%, impaired relaxation, basal inferior hypokinesis.  Also showed   moderate to severe aortic valve regurgitation, nodular calcification of the aortic valve.  Could not have TEE.  Unlikely infection.  Hypertension: Blood pressures fluctuating.  Starting on clonidine protocol today.   Smoker: Counseled to quit.  Motivated and ready to quit.   DVT prophylaxis: Lovenox Code Status: Full Family Communication: Communicated the patient. Disposition Plan: Home when stable.   Consultants: Neurology  Procedures: MRI of the brain  Antimicrobials:  Anti-infectives (From admission, onward)   None      Subjective: Patient seen and examined.  She was well all day yesterday.  Early morning today she started having vomiting and also have epigastric pain.  One episode of loose stool in the morning.  Blood pressures remain slightly elevated and fluctuating.  Heart rate remains stable.  Pain improved with morphine and gastric cocktail. Today only patient admitted that she uses heroin at home.  Objective: Vitals:   06/28/18 2138 06/28/18 2327 06/29/18 0354 06/29/18 0831  BP: (!) 100/58 105/61 Marland Kitchen)  150/73 (!) 179/92  Pulse: (!) 54 (!) 57 61 69  Resp: 17 17 16 18   Temp: 98 F  (36.7 C) 98.2 F (36.8 C) 98.2 F (36.8 C) (!) 97.5 F (36.4 C)  TempSrc: Oral Oral Oral Oral  SpO2: 100% 97% 100% 97%  Weight:      Height:       No intake or output data in the 24 hours ending 06/29/18 1027 Filed Weights   06/24/18 1332  Weight: 69.9 kg    Examination:  General exam: Appears calm and comfortable ,Not in distress but anxious. HEENT:PERRL,Oral mucosa moist, Ear/Nose normal on gross exam Respiratory system: Bilateral equal air entry, normal vesicular breath sounds, no wheezes or crackles  Cardiovascular system: S1 & S2 heard, RRR.Marland Kitchen No pedal edema. Gastrointestinal system: Abdomen is nondistended, soft and mild epigastric tenderness.  No organomegaly or masses felt. Normal bowel sounds heard. Central nervous system: Alert and oriented.  Cranial nerves II to XII normal. Motor examination left hand grip 4+ which is slightly less strength than right side.  Other neurological examination normal. Extremities: No edema, no clubbing ,no cyanosis, distal peripheral pulses palpable. Skin: No rashes, lesions or ulcers,no icterus ,no pallor MSK: Normal muscle bulk,tone ,power Psychiatry: Judgement and insight appear normal. Mood & affect appropriate.     Data Reviewed: I have personally reviewed following labs and imaging studies  CBC: Recent Labs  Lab 06/24/18 1404 06/27/18 0739 06/29/18 0832  WBC 7.0 8.9 5.4  NEUTROABS  --  6.4 2.4  HGB 14.4 16.5* 13.7  HCT 41.0 RESULTS UNAVAILABLE DUE TO INTERFERING SUBSTANCE 37.3  MCV 84.0 RESULTS UNAVAILABLE DUE TO INTERFERING SUBSTANCE 80.0  PLT 227 241 867   Basic Metabolic Panel: Recent Labs  Lab 06/24/18 1404 06/27/18 0739 06/29/18 0832  NA 140 135 139  K 3.9 3.0* 3.8  CL 104 96* 106  CO2 29 25 25   GLUCOSE 108* 114* 99  BUN 12 14 21*  CREATININE 0.79 0.74 0.95  CALCIUM 9.3 9.7 8.8*  MG  --  2.0 2.1   GFR: Estimated Creatinine Clearance: 65.7 mL/min (by C-G formula based on SCr of 0.95 mg/dL). Liver  Function Tests: Recent Labs  Lab 06/24/18 1404  AST 19  ALT 13  ALKPHOS 74  BILITOT 0.7  PROT 8.0  ALBUMIN 4.2   No results for input(s): LIPASE, AMYLASE in the last 168 hours. No results for input(s): AMMONIA in the last 168 hours. Coagulation Profile: Recent Labs  Lab 06/24/18 1404  INR 0.9   Cardiac Enzymes: No results for input(s): CKTOTAL, CKMB, CKMBINDEX, TROPONINI in the last 168 hours. BNP (last 3 results) No results for input(s): PROBNP in the last 8760 hours. HbA1C: No results for input(s): HGBA1C in the last 72 hours. CBG: No results for input(s): GLUCAP in the last 168 hours. Lipid Profile: No results for input(s): CHOL, HDL, LDLCALC, TRIG, CHOLHDL, LDLDIRECT in the last 72 hours. Thyroid Function Tests: No results for input(s): TSH, T4TOTAL, FREET4, T3FREE, THYROIDAB in the last 72 hours. Anemia Panel: No results for input(s): VITAMINB12, FOLATE, FERRITIN, TIBC, IRON, RETICCTPCT in the last 72 hours. Sepsis Labs: No results for input(s): PROCALCITON, LATICACIDVEN in the last 168 hours.  Recent Results (from the past 240 hour(s))  SARS Coronavirus 2 (CEPHEID - Performed in [place] hospital lab), Hosp Order     Status: None   Collection Time: 06/24/18  1:54 PM   Specimen: Nasopharyngeal Swab  Result Value Ref Range Status   SARS Coronavirus 2  NEGATIVE NEGATIVE Final    Comment: (NOTE) If result is NEGATIVE SARS-CoV-2 target nucleic acids are NOT DETECTED. The SARS-CoV-2 RNA is generally detectable in upper and lower  respiratory specimens during the acute phase of infection. The lowest  concentration of SARS-CoV-2 viral copies this assay can detect is 250  copies / mL. A negative result does not preclude SARS-CoV-2 infection  and should not be used as the sole basis for treatment or other  patient management decisions.  A negative result may occur with  improper specimen collection / handling, submission of specimen other  than nasopharyngeal swab,  presence of viral mutation(s) within the  areas targeted by this assay, and inadequate number of viral copies  (<250 copies / mL). A negative result must be combined with clinical  observations, patient history, and epidemiological information. If result is POSITIVE SARS-CoV-2 target nucleic acids are DETECTED. The SARS-CoV-2 RNA is generally detectable in upper and lower  respiratory specimens dur ing the acute phase of infection.  Positive  results are indicative of active infection with SARS-CoV-2.  Clinical  correlation with patient history and other diagnostic information is  necessary to determine patient infection status.  Positive results do  not rule out bacterial infection or co-infection with other viruses. If result is PRESUMPTIVE POSTIVE SARS-CoV-2 nucleic acids MAY BE PRESENT.   A presumptive positive result was obtained on the submitted specimen  and confirmed on repeat testing.  While 2019 novel coronavirus  (SARS-CoV-2) nucleic acids may be present in the submitted sample  additional confirmatory testing may be necessary for epidemiological  and / or clinical management purposes  to differentiate between  SARS-CoV-2 and other Sarbecovirus currently known to infect humans.  If clinically indicated additional testing with an alternate test  methodology [phone]) is advised. The SARS-CoV-2 RNA is generally  detectable in upper and lower respiratory sp ecimens during the acute  phase of infection. The expected result is Negative. Fact Sheet for Patients:  StrictlyIdeas.no Fact Sheet for Healthcare Providers: BankingDealers.co.za This test is not yet approved or cleared by the [place] FDA and has been authorized for detection and/or diagnosis of SARS-CoV-2 by FDA under an Emergency Use Authorization (EUA).  This EUA will remain in effect (meaning this test can be used) for the duration of the COVID-19 declaration under  Section 564(b)(1) of the Act, 21 U.S.C. section 360bbb-3(b)(1), unless the authorization is terminated or revoked sooner. Performed at [facility], [place] [address] [place], [place] [address]          Radiology Studies: Vas [place] Transcranial Doppler W Bubbles  Result Date: 06/28/2018  Transcranial Doppler with Bubble Indications: Stroke. Performing Technologist: Abram Sander RVS  Examination Guidelines: A complete evaluation includes B-mode imaging, spectral Doppler, color Doppler, and power Doppler as needed of all accessible portions of each vessel. Bilateral testing is considered an integral part of a complete examination. Limited examinations for reoccurring indications may be performed as noted.  Summary:  A vascular evaluation was performed. The left Opthalmic artery was studied. An IV was inserted into the patient's right Forearm. Verbal informed consent was obtained.  No HITS heard during valsalva or at rest. *See table(s) above for measurements and observations.    Preliminary         Scheduled Meds: . acyclovir ointment   Topical Q3H  . aspirin EC  81 mg Oral Daily  . atorvastatin  40 mg Oral q1800  . cloNIDine  0.1 mg Oral QID   Followed  by  . Derrill Memo ON 07/01/2018] cloNIDine  0.1 mg Oral BH-qamhs   Followed by  . [START ON 07/03/2018] cloNIDine  0.1 mg Oral QAC breakfast  . clopidogrel  75 mg Oral Daily  . enoxaparin (LOVENOX) injection  40 mg Subcutaneous Q24H  . metoprolol tartrate  25 mg Oral BID  . pantoprazole (PROTONIX) IV  40 mg Intravenous Q12H   Continuous Infusions: . sodium chloride 0.9 % with KCl/Additives Pediatric custom IV fluid 100 mL/hr at 06/28/18 1701  . sodium chloride    . sodium chloride       LOS: 4 days    Time spent: 25 minutes      Barb Merino, MD [facility] Pager [phone]  If 7PM-7AM, please contact night-coverage [email or web address] Password [facility] 06/29/2018, 10:27 AM

## 2018-06-30 MED ORDER — ACYCLOVIR 5 % EX OINT: TOPICAL_OINTMENT | CUTANEOUS | 0 refills | Status: DC

## 2018-06-30 MED ORDER — ASPIRIN 81 MG PO TBEC: 81.0000 mg | DELAYED_RELEASE_TABLET | Freq: Every day | ORAL | Status: DC

## 2018-06-30 MED ORDER — METOPROLOL TARTRATE 25 MG PO TABS: 12.5000 mg | ORAL_TABLET | Freq: Every day | ORAL | 1 refills | Status: DC

## 2018-06-30 MED ORDER — HYDROXYZINE HCL 25 MG PO TABS: 25.0000 mg | ORAL_TABLET | Freq: Four times a day (QID) | ORAL | 0 refills | Status: DC | PRN

## 2018-06-30 MED ORDER — ATORVASTATIN CALCIUM 40 MG PO TABS: 40.0000 mg | ORAL_TABLET | Freq: Every day | ORAL | 3 refills | Status: DC

## 2018-06-30 MED ORDER — LOPERAMIDE HCL 2 MG PO CAPS: 2.0000 mg | ORAL_CAPSULE | ORAL | 0 refills | Status: DC | PRN

## 2018-06-30 MED ORDER — CLONIDINE HCL 0.1 MG PO TABS: 0.1000 mg | ORAL_TABLET | Freq: Every day | ORAL | 0 refills | Status: DC

## 2018-06-30 MED ORDER — ONDANSETRON 4 MG PO TBDP: 4.0000 mg | ORAL_TABLET | Freq: Four times a day (QID) | ORAL | 0 refills | Status: DC | PRN

## 2018-06-30 MED ORDER — CLOPIDOGREL BISULFATE 75 MG PO TABS: 75.0000 mg | ORAL_TABLET | Freq: Every day | ORAL | 0 refills | Status: DC

## 2018-06-30 NOTE — Progress Notes (Signed)
Assisted pt in ordering meal tray  Pt denies nausea and pain  Will continue to monitor

## 2018-06-30 NOTE — Progress Notes (Signed)
Reassed pt vitals  Pt denies pain or change in vision  Will continue to monitor

## 2018-06-30 NOTE — Progress Notes (Signed)
Pt discharged via wheelchair with NT  Pt has all discharge paper work, belonging and loop recorder equipment

## 2018-06-30 NOTE — Progress Notes (Signed)
MD stated to progress pt diet, see how she tolerates solid food prior to discharge  Pt tolerated clear liquids at breakfast, no complaints Pt aware of plan  Will continue to monitor

## 2018-06-30 NOTE — Progress Notes (Signed)
Pt ate two packages of graham crackers, tolerated well Pt did not want to eat lunch tray  MD aware, stated okay to discharge pt  Discharge education provided at bedside  Pt verbalizes understanding  Telemetry removed and midline removed, catheter intact, dressing clean and dry  Pt has all belongings  Awaiting transportation

## 2018-06-30 NOTE — Discharge Summary (Signed)
Physician Discharge Summary  Cambrie Sonnenfeld JOA:[id number] DOB: 1965-05-25 DOA: 06/24/2018  PCP: Elbert Ewings, FNP  Admit date: 06/24/2018 Discharge date: 06/30/2018  Admitted From: Home Disposition: Home  recommendations for Outpatient Follow-up:  1. Follow up with PCP in 1-2 weeks 2. Please obtain BMP/CBC in one week 3. Please follow up with [facility] neurology 4. Follow-up with cardiology 5. Medication changes have stopped hydrochlorothiazide and lisinopril due to soft blood pressure and decrease metoprolol to 12.5 mg daily.  She is also on clonidine 0.1 mg daily for 4 days to finish the protocol for heroin withdrawal.  Home Health none Equipment/Devices none Discharge Condition stable and improved CODE STATUS full code Diet recommendation: Cardiac diet  Brief/Interim Summary: 53 year old female with history of hypertension who presented to the emergency department with complaints of left facial numbness, left arm, hand numbness and weakness of one day duration.  CT head on presentation did not show any acute intracranial abnormalities.  MRI of the brain showed small acute infarct in posterior right frontal lobe.  MRA did not show any emergent findings. Neurology consulted.  She was transferred to [facility] from [facility] for neuro care. She developed some gastroenteritis and had bilious vomiting on attempt to do TEE.  Received loop recorder.   Discharge Diagnoses:  Principal Problem:   TIA (transient ischemic attack) Active Problems:   Essential hypertension   Heroin dependence ([place])  Acute ischemic stroke: MRI finding as above.  2D echocardiogram normal. Carotid duplex shows atherosclerosis. Hemoglobin A1c of 5.7.  LDL of 127.  Started on Lipitor 40 mg daily.  Patient seen by neurology suggested 3 weeks of aspirin and Plavix and then continue aspirin indefinitely.  She was seen by PT OT and speech therapy no further intervention planned. She had transthoracic echo with bubble protocol, no  PFO seen. Loop recorder placed, 06/28/2018.  Gastroenteritis-like symptoms, nausea vomiting and epigastric abdominal pain: Likely heroin withdrawal. Intermittent symptoms.  Currently hemodynamically stable.  Blood pressure remained stable throughout the night.  Patient tolerating diet without any abdominal pain nausea vomiting.  She is anxious to go home.  I will finish the clonidine withdrawal protocol.  CT scan of the abdomen was done.  CT showed The colon is decompressed, however appears to be mildly thickened along its length with minimal adjacent inflammatory fat stranding. Findings are suggestive, although not definitive for nonspecific infectious, inflammatory, or ischemic colitis.  Bulky uterine fibroids.  Aortic regurgitation: Echocardiogram showed normal systolic function with ejection fraction of 55 to 60%, impaired relaxation, basal inferior hypokinesis.  Also showed   moderate to severe aortic valve regurgitation, nodular calcification of the aortic valve.  Could not have TEE due to vomiting.    Hypertension: Blood pressures stable decrease the dose of beta-blocker to 12.5 daily.  Finish clonidine 0.1 mg 4 days after discharge.    Smoker: Counseled to quit.  Motivated and ready to quit   Estimated body mass index is 26.43 kg/m as calculated from the following:   Height as of this encounter: 5\' 4"  (1.626 m).   Weight as of this encounter: 69.9 kg.  Discharge Instructions   Allergies as of 06/30/2018   No Known Allergies     Medication List    STOP taking these medications   lisinopril-hydrochlorothiazide 20-12.5 MG tablet Commonly known as: ZESTORETIC     TAKE these medications   acyclovir ointment 5 % Commonly known as: ZOVIRAX Apply topically every 3 (three) hours.   aspirin 81 MG EC tablet Take 1 tablet (81  mg total) by mouth daily.   atorvastatin 40 MG tablet Commonly known as: LIPITOR Take 1 tablet (40 mg total) by mouth daily at 6 PM.   cloNIDine 0.1  MG tablet Commonly known as: CATAPRES Take 1 tablet (0.1 mg total) by mouth daily for 4 days.   clopidogrel 75 MG tablet Commonly known as: PLAVIX Take 1 tablet (75 mg total) by mouth daily.   hydrOXYzine 25 MG tablet Commonly known as: ATARAX/VISTARIL Take 1 tablet (25 mg total) by mouth every 6 (six) hours as needed for anxiety.   loperamide 2 MG capsule Commonly known as: IMODIUM Take 1-2 capsules (2-4 mg total) by mouth as needed for diarrhea or loose stools (diarrhea).   metoprolol tartrate 25 MG tablet Commonly known as: LOPRESSOR Take 0.5 tablets (12.5 mg total) by mouth daily. What changed:   how much to take  when to take this   ondansetron 4 MG disintegrating tablet Commonly known as: ZOFRAN-ODT Take 1 tablet (4 mg total) by mouth every 6 (six) hours as needed for nausea or vomiting.      Follow-up Information    [facility] Follow up.   Specialty: Cardiology Why: 07/10/2018 @ 12:00PM (noon), wound check visit Contact information: [address] [place]       Elbert Ewings, FNP Follow up.   Specialty: Nurse Practitioner Contact information: [place] [address] [phone]        [facility] Follow up.   Specialty: Neurology Contact information: [address] [place] [phone]         No Known Allergies  Consultations: [place] neurology  Procedures/Studies: Ct Head Wo Contrast  Result Date: 06/24/2018 CLINICAL DATA:  Left hand and face numbness upon awakening EXAM: CT HEAD WITHOUT CONTRAST TECHNIQUE: Contiguous axial images were obtained from the base of the skull through the vertex without intravenous contrast. COMPARISON:  None. FINDINGS: Brain: No evidence of acute infarction, hemorrhage, hydrocephalus, extra-axial collection or mass lesion/mass effect. Probable mild white  matter disease with patchy low-density in the cerebral white matter. History of hypertension suggests this is chronic microvascular ischemia. Normal brain volume Vascular: No hyperdense vessel. There is atherosclerotic calcification. Skull: Normal. Negative for fracture or focal lesion. Sinuses/Orbits: No acute finding. IMPRESSION: 1. No acute finding.  No visible infarct. 2. Probable mild chronic small vessel ischemia. Electronically Signed   By: Monte Fantasia M.D.   On: 06/24/2018 15:10   Mr Jodene Nam Head Wo Contrast  Result Date: 06/24/2018 CLINICAL DATA:  Left hand and mouth numbness that started yesterday EXAM: MRI HEAD WITHOUT CONTRAST MRA HEAD WITHOUT CONTRAST TECHNIQUE: Multiplanar, multiecho pulse sequences of the brain and surrounding structures were obtained without intravenous contrast. Angiographic images of the head were obtained using MRA technique without contrast. 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 Mild to moderate for age white matter disease, likely chronic small vessel ischemia given history of hypertension. Brain volume is normal. No acute hemorrhage, hydrocephalus, masslike finding, or collection. Vascular: Arterial findings below. Skull and upper cervical spine: Negative for marrow lesion Sinuses/Orbits: Minor mucosal thickening in paranasal sinuses. MRA HEAD FINDINGS Symmetric carotid arteries and branching. No proximal flow limiting stenosis or branch occlusion. The vertebral and basilar arteries are smooth and widely patent. Fetal type PCA anatomy on the right. Negative  for aneurysm or branch occlusion. There is mild atherosclerotic irregularity of bilateral PCA branches. There is asymmetric blurring of proximal right MCA due to artifact, asymmetric due to head tilt. IMPRESSION: 1. 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 2. Mild to moderate white  matter disease is likely chronic small vessel ischemia. 3. No emergent finding on intracranial MRA. There is mild atherosclerosis best seen in the posterior cerebral arteries. Electronically Signed   By: Monte Fantasia M.D.   On: 06/24/2018 18:22   Mr Brain Wo Contrast (neuro Protocol)  Result Date: 06/24/2018 CLINICAL DATA:  Left hand and mouth numbness that started yesterday EXAM: MRI HEAD WITHOUT CONTRAST MRA HEAD WITHOUT CONTRAST TECHNIQUE: Multiplanar, multiecho pulse sequences of the brain and surrounding structures were obtained without intravenous contrast. Angiographic images of the head were obtained using MRA technique without contrast. 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 Mild to moderate for age white matter disease, likely chronic small vessel ischemia given history of hypertension. Brain volume is normal. No acute hemorrhage, hydrocephalus, masslike finding, or collection. Vascular: Arterial findings below. Skull and upper cervical spine: Negative for marrow lesion Sinuses/Orbits: Minor mucosal thickening in paranasal sinuses. MRA HEAD FINDINGS Symmetric carotid arteries and branching. No proximal flow limiting stenosis or branch occlusion. The vertebral and basilar arteries are smooth and widely patent. Fetal type PCA anatomy on the right. Negative for aneurysm or branch occlusion. There is mild atherosclerotic irregularity of bilateral PCA branches. There is asymmetric blurring of proximal right MCA due to artifact, asymmetric due to head tilt. IMPRESSION: 1. 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 2. Mild to moderate white matter disease is likely chronic small vessel ischemia. 3. No emergent finding on intracranial MRA. There is mild atherosclerosis best seen in the posterior cerebral arteries. Electronically Signed   By: Monte Fantasia M.D.   On:  06/24/2018 18:22   Ct Abdomen Pelvis W Contrast  Result Date: 06/29/2018 CLINICAL DATA:  Abdominal pain, evaluate for gastroenteritis or colitis EXAM: CT ABDOMEN AND PELVIS WITH CONTRAST TECHNIQUE: Multidetector CT imaging of the abdomen and pelvis was performed using the standard protocol following bolus administration of intravenous contrast. CONTRAST:  134mL OMNIPAQUE IOHEXOL 300 MG/ML  SOLN COMPARISON:  None. FINDINGS: Lower chest: No acute abnormality. Hepatobiliary: No solid liver abnormality is seen. No gallstones, gallbladder wall thickening, or biliary dilatation. Pancreas: Unremarkable. No pancreatic ductal dilatation or surrounding inflammatory changes. Spleen: Normal in size without significant abnormality. Adrenals/Urinary Tract: Adrenal glands are unremarkable. Kidneys are normal, without renal calculi, solid lesion, or hydronephrosis. Bladder is unremarkable. Stomach/Bowel: Stomach is within normal limits. Appendix appears normal. The colon is decompressed, however appears to be mildly thickened along its length with minimal adjacent inflammatory fat stranding. Vascular/Lymphatic: Aortic atherosclerosis. No enlarged abdominal or pelvic lymph nodes. Reproductive: Multiple bulky uterine fibroids. Other: No abdominal wall hernia or abnormality. No abdominopelvic ascites. Musculoskeletal: No acute or significant osseous findings. IMPRESSION: 1. The colon is decompressed, however appears to be mildly thickened along its length with minimal adjacent inflammatory fat stranding. Findings are suggestive, although not definitive for nonspecific infectious, inflammatory, or ischemic colitis. 2.  Bulky uterine fibroids. Electronically Signed   By: Eddie Candle M.D.   On: 06/29/2018 13:40   Vas [place] Transcranial Doppler W Bubbles  Result Date: 06/29/2018  Transcranial Doppler with Bubble Indications: Stroke. Performing Technologist: Abram Sander RVS  Examination Guidelines:  A complete evaluation includes  B-mode imaging, spectral Doppler, color Doppler, and power Doppler as needed of all accessible portions of each vessel. Bilateral testing is considered an integral part of a complete examination. Limited examinations for reoccurring indications may be performed as noted.  Summary:  A vascular evaluation was performed. The left Opthalmic artery was studied. An IV was inserted into the patient's right Forearm. Verbal informed consent was obtained.  No HITS heard during valsalva or at rest. Slightly suboptimal study as only orbital window could be used but Negative TCD Bubble study *See table(s) above for measurements and observations.  Diagnosing physician: Antony Contras MD Electronically signed by Antony Contras MD on 06/29/2018 at 10:50:04 AM.    Final    Vas US Carotid (at [place] Only)  Result Date: 06/26/2018 Carotid Arterial Duplex Study Indications:       CVA and Numbness. Risk Factors:      Hypertension. Comparison Study:  No prior study on file for comparison. Performing Technologist: Sharion Dove RVS  Examination Guidelines: A complete evaluation includes B-mode imaging, spectral Doppler, color Doppler, and power Doppler as needed of all accessible portions of each vessel. Bilateral testing is considered an integral part of a complete examination. Limited examinations for reoccurring indications may be performed as noted.  Right Carotid Findings: +----------+--------+--------+--------+--------+------------------+           PSV cm/sEDV cm/sStenosisDescribeComments           +----------+--------+--------+--------+--------+------------------+ CCA Prox  78      16                      intimal thickening +----------+--------+--------+--------+--------+------------------+ CCA Distal74      18                      intimal thickening +----------+--------+--------+--------+--------+------------------+ ICA Prox  58      18                                          +----------+--------+--------+--------+--------+------------------+ ICA Distal76      15                                         +----------+--------+--------+--------+--------+------------------+ ECA       212     24              calcific                   +----------+--------+--------+--------+--------+------------------+ +----------+--------+-------+--------+-------------------+           PSV cm/sEDV cmsDescribeArm Pressure (mmHG) +----------+--------+-------+--------+-------------------+ Subclavian114                                        +----------+--------+-------+--------+-------------------+ +---------+--------+--+--------+--+ VertebralPSV cm/s57EDV cm/s14 +---------+--------+--+--------+--+  Left Carotid Findings: +----------+--------+--------+--------+------------+------------------+           PSV cm/sEDV cm/sStenosisDescribe    Comments           +----------+--------+--------+--------+------------+------------------+ CCA Prox  64      17                          intimal thickening +----------+--------+--------+--------+------------+------------------+ CCA Distal69  19                          intimal thickening +----------+--------+--------+--------+------------+------------------+ ICA Prox  75      18              heterogenous                   +----------+--------+--------+--------+------------+------------------+ ICA Distal97      20                                             +----------+--------+--------+--------+------------+------------------+ ECA       85      14                                             +----------+--------+--------+--------+------------+------------------+ +----------+--------+--------+--------+-------------------+ SubclavianPSV cm/sEDV cm/sDescribeArm Pressure (mmHG) +----------+--------+--------+--------+-------------------+           95                                           +----------+--------+--------+--------+-------------------+ +---------+--------+--+--------+--+ VertebralPSV cm/s71EDV cm/s16 +---------+--------+--+--------+--+  Summary: Right Carotid: The extracranial vessels were near-normal with only minimal wall                thickening or plaque. Left Carotid: The extracranial vessels were near-normal with only minimal wall               thickening or plaque. Vertebrals:  Bilateral vertebral arteries demonstrate antegrade flow. Subclavians: Normal flow hemodynamics were seen in bilateral subclavian              arteries. *See table(s) above for measurements and observations.  Electronically signed by Antony Contras MD on 06/26/2018 at 1:18:53 PM.    Final    (Echo, Carotid, EGD, Colonoscopy, ERCP)    Subjective: Patient sitting up in her chair drinking coffee appears anxious to go home denies any nausea vomiting abdominal pain or diarrhea.  Discharge Exam: Vitals:   06/30/18 0029 06/30/18 0400  BP: 128/76 (!) 120/57  Pulse: 78 71  Resp: 16 16  Temp: 98.3 F (36.8 C) 97.8 F (36.6 C)  SpO2: 100% 100%   Vitals:   06/29/18 2032 06/29/18 2147 06/30/18 0029 06/30/18 0400  BP: (!) 182/82 (!) 161/84 128/76 (!) 120/57  Pulse: 70 80 78 71  Resp: 17  16 16   Temp: 99.2 F (37.3 C)  98.3 F (36.8 C) 97.8 F (36.6 C)  TempSrc: Oral  Oral Oral  SpO2: 100%  100% 100%  Weight:      Height:        General: Pt is alert, awake, not in acute distress Cardiovascular: RRR, S1/S2 +, no rubs, no gallops Respiratory: CTA bilaterally, no wheezing, no rhonchi Abdominal: Soft, NT, ND, bowel sounds + Extremities: no edema, no cyanosis    The results of significant diagnostics from this hospitalization (including imaging, microbiology, ancillary and laboratory) are listed below for reference.     Microbiology: Recent Results (from the past 240 hour(s))  SARS Coronavirus 2 (CEPHEID - Performed in [place] hospital lab), Hosp Order     Status: None  Collection Time: 06/24/18  1:54 PM   Specimen: Nasopharyngeal Swab  Result Value Ref Range Status   SARS Coronavirus 2 NEGATIVE NEGATIVE Final    Comment: (NOTE) If result is NEGATIVE SARS-CoV-2 target nucleic acids are NOT DETECTED. The SARS-CoV-2 RNA is generally detectable in upper and lower  respiratory specimens during the acute phase of infection. The lowest  concentration of SARS-CoV-2 viral copies this assay can detect is 250  copies / mL. A negative result does not preclude SARS-CoV-2 infection  and should not be used as the sole basis for treatment or other  patient management decisions.  A negative result may occur with  improper specimen collection / handling, submission of specimen other  than nasopharyngeal swab, presence of viral mutation(s) within the  areas targeted by this assay, and inadequate number of viral copies  (<250 copies / mL). A negative result must be combined with clinical  observations, patient history, and epidemiological information. If result is POSITIVE SARS-CoV-2 target nucleic acids are DETECTED. The SARS-CoV-2 RNA is generally detectable in upper and lower  respiratory specimens dur ing the acute phase of infection.  Positive  results are indicative of active infection with SARS-CoV-2.  Clinical  correlation with patient history and other diagnostic information is  necessary to determine patient infection status.  Positive results do  not rule out bacterial infection or co-infection with other viruses. If result is PRESUMPTIVE POSTIVE SARS-CoV-2 nucleic acids MAY BE PRESENT.   A presumptive positive result was obtained on the submitted specimen  and confirmed on repeat testing.  While 2019 novel coronavirus  (SARS-CoV-2) nucleic acids may be present in the submitted sample  additional confirmatory testing may be necessary for epidemiological  and / or clinical management purposes  to differentiate between  SARS-CoV-2 and other Sarbecovirus  currently known to infect humans.  If clinically indicated additional testing with an alternate test  methodology [phone]) is advised. The SARS-CoV-2 RNA is generally  detectable in upper and lower respiratory sp ecimens during the acute  phase of infection. The expected result is Negative. Fact Sheet for Patients:  StrictlyIdeas.no Fact Sheet for Healthcare Providers: BankingDealers.co.za This test is not yet approved or cleared by the [place] FDA and has been authorized for detection and/or diagnosis of SARS-CoV-2 by FDA under an Emergency Use Authorization (EUA).  This EUA will remain in effect (meaning this test can be used) for the duration of the COVID-19 declaration under Section 564(b)(1) of the Act, 21 U.S.C. section 360bbb-3(b)(1), unless the authorization is terminated or revoked sooner. Performed at [facility], [place] [address], [place], [place] [address]      Labs: BNP (last 3 results) No results for input(s): BNP in the last 8760 hours. Basic Metabolic Panel: Recent Labs  Lab 06/24/18 1404 06/27/18 0739 06/29/18 0832  NA 140 135 139  K 3.9 3.0* 3.8  CL 104 96* 106  CO2 29 25 25   GLUCOSE 108* 114* 99  BUN 12 14 21*  CREATININE 0.79 0.74 0.95  CALCIUM 9.3 9.7 8.8*  MG  --  2.0 2.1   Liver Function Tests: Recent Labs  Lab 06/24/18 1404  AST 19  ALT 13  ALKPHOS 74  BILITOT 0.7  PROT 8.0  ALBUMIN 4.2   No results for input(s): LIPASE, AMYLASE in the last 168 hours. No results for input(s): AMMONIA in the last 168 hours. CBC: Recent Labs  Lab 06/24/18 1404 06/27/18 0739 06/29/18 0832  WBC 7.0 8.9 5.4  NEUTROABS  --  6.4 2.4  HGB 14.4 16.5* 13.7  HCT 41.0 RESULTS UNAVAILABLE DUE TO INTERFERING SUBSTANCE 37.3  MCV 84.0 RESULTS UNAVAILABLE DUE TO INTERFERING SUBSTANCE 80.0  PLT 227 241 201   Cardiac Enzymes: No results for input(s): CKTOTAL, CKMB, CKMBINDEX, TROPONINI in  the last 168 hours. BNP: Invalid input(s): POCBNP CBG: No results for input(s): GLUCAP in the last 168 hours. D-Dimer No results for input(s): DDIMER in the last 72 hours. Hgb A1c No results for input(s): HGBA1C in the last 72 hours. Lipid Profile No results for input(s): CHOL, HDL, LDLCALC, TRIG, CHOLHDL, LDLDIRECT in the last 72 hours. Thyroid function studies No results for input(s): TSH, T4TOTAL, T3FREE, THYROIDAB in the last 72 hours.  Invalid input(s): FREET3 Anemia work up No results for input(s): VITAMINB12, FOLATE, FERRITIN, TIBC, IRON, RETICCTPCT in the last 72 hours. Urinalysis    Component Value Date/Time   COLORURINE YELLOW 06/27/2018 1630   APPEARANCEUR CLEAR 06/27/2018 1630   LABSPEC 1.024 06/27/2018 1630   PHURINE 6.0 06/27/2018 1630   GLUCOSEU NEGATIVE 06/27/2018 1630   HGBUR SMALL (A) 06/27/2018 1630   BILIRUBINUR NEGATIVE 06/27/2018 1630   KETONESUR NEGATIVE 06/27/2018 1630   PROTEINUR 100 (A) 06/27/2018 1630   NITRITE NEGATIVE 06/27/2018 1630   LEUKOCYTESUR NEGATIVE 06/27/2018 1630   Sepsis Labs Invalid input(s): PROCALCITONIN,  WBC,  LACTICIDVEN Microbiology Recent Results (from the past 240 hour(s))  SARS Coronavirus 2 (CEPHEID - Performed in [place] hospital lab), Hosp Order     Status: None   Collection Time: 06/24/18  1:54 PM   Specimen: Nasopharyngeal Swab  Result Value Ref Range Status   SARS Coronavirus 2 NEGATIVE NEGATIVE Final    Comment: (NOTE) If result is NEGATIVE SARS-CoV-2 target nucleic acids are NOT DETECTED. The SARS-CoV-2 RNA is generally detectable in upper and lower  respiratory specimens during the acute phase of infection. The lowest  concentration of SARS-CoV-2 viral copies this assay can detect is 250  copies / mL. A negative result does not preclude SARS-CoV-2 infection  and should not be used as the sole basis for treatment or other  patient management decisions.  A negative result may occur with  improper specimen  collection / handling, submission of specimen other  than nasopharyngeal swab, presence of viral mutation(s) within the  areas targeted by this assay, and inadequate number of viral copies  (<250 copies / mL). A negative result must be combined with clinical  observations, patient history, and epidemiological information. If result is POSITIVE SARS-CoV-2 target nucleic acids are DETECTED. The SARS-CoV-2 RNA is generally detectable in upper and lower  respiratory specimens dur ing the acute phase of infection.  Positive  results are indicative of active infection with SARS-CoV-2.  Clinical  correlation with patient history and other diagnostic information is  necessary to determine patient infection status.  Positive results do  not rule out bacterial infection or co-infection with other viruses. If result is PRESUMPTIVE POSTIVE SARS-CoV-2 nucleic acids MAY BE PRESENT.   A presumptive positive result was obtained on the submitted specimen  and confirmed on repeat testing.  While 2019 novel coronavirus  (SARS-CoV-2) nucleic acids may be present in the submitted sample  additional confirmatory testing may be necessary for epidemiological  and / or clinical management purposes  to differentiate between  SARS-CoV-2 and other Sarbecovirus currently known to infect humans.  If clinically indicated additional testing with an alternate test  methodology [phone]) is advised. The SARS-CoV-2 RNA is generally  detectable in upper and lower respiratory  sp ecimens during the acute  phase of infection. The expected result is Negative. Fact Sheet for Patients:  StrictlyIdeas.no Fact Sheet for Healthcare Providers: BankingDealers.co.za This test is not yet approved or cleared by the [place] FDA and has been authorized for detection and/or diagnosis of SARS-CoV-2 by FDA under an Emergency Use Authorization (EUA).  This EUA will remain in effect  (meaning this test can be used) for the duration of the COVID-19 declaration under Section 564(b)(1) of the Act, 21 U.S.C. section 360bbb-3(b)(1), unless the authorization is terminated or revoked sooner. Performed at [facility], [place] [address] [place], [place] [address]      Time coordinating discharge: 34  minutes  SIGNED:   Georgette Shell, MD  [facility] 06/30/2018, 8:30 AM Pager   If 7PM-7AM, please contact night-coverage [email or web address] Password [email or web address]

## 2018-06-30 NOTE — Care Management (Signed)
Patient provided with [facility] letter prior to [place]

## 2018-07-06 ENCOUNTER — Telehealth: Payer: Self-pay

## 2018-07-06 NOTE — Telephone Encounter (Signed)
Appointment 07-10-2018 at 12 pm.       COVID-19 Pre-Screening Questions:  . In the past 7 to 10 days have you had a cough,  shortness of breath, headache, congestion, fever (100 or greater) body aches, chills, sore throat, or sudden loss of taste or sense of smell? No . Have you been around anyone with known Covid 19. No  . Have you been around anyone who is awaiting Covid 19 test results in the past 7 to 10 days? No . Have you been around anyone who has been exposed to Covid 19, or has mentioned symptoms of Covid 19 within the past 7 to 10 days? No  If you have any concerns/questions about symptoms patients report during screening (either on the phone or at threshold). Contact the provider seeing the patient or DOD for further guidance.  If neither are available contact a member of the leadership team.  Pt answered No to all covid-19 prescreen questions. I asked the pt to wear a mask if she has one. I also informed the pt to come to her appointment alone if she can do so. I explained that we are reducing the number of people coming into the office. I told the her if anything should change between now and her appointment to please call the office to let us know. Pt verbalized understanding.

## 2018-07-10 ENCOUNTER — Ambulatory Visit (INDEPENDENT_AMBULATORY_CARE_PROVIDER_SITE_OTHER): Payer: Self-pay | Admitting: *Deleted

## 2018-07-10 ENCOUNTER — Other Ambulatory Visit: Payer: Self-pay

## 2018-07-10 DIAGNOSIS — I639 Cerebral infarction, unspecified: Secondary | ICD-10-CM

## 2018-07-10 NOTE — Progress Notes (Signed)
Loop check in clinic. Battery status: good. R-waves 0.15mV. 0 symptom episodes, 0 tachy episodes, 0 pause episodes, 0 brady episodes. 0 AF episodes (0% burden). Monthly summary reports scheduled.

## 2018-07-11 LAB — CUP PACEART INCLINIC DEVICE CHECK
Date Time Interrogation Session: 20200623160340
Implantable Pulse Generator Implant Date: 20200610

## 2018-07-16 ENCOUNTER — Other Ambulatory Visit: Payer: Self-pay

## 2018-07-16 NOTE — Patient Outreach (Signed)
[facility]) Care Management  07/16/2018  Leslie Walker Jan 23, 1965 [id number]   EMMI- Stroke not on APL RED ON EMMI ALERT Day #  13 Date: 07/14/2018 Red Alert Reason:  Questions/problems with meds? Yes  Went to follow-up appointment? No   Outreach attempt: Telephone call to patient.  She is able to verify HIPAA.  Addressed red alerts.  Patient states she has a follow up appointment in July.  Patient denies any problems with medications but did have a question about no refills on medications.  Advised patient that when she goes for follow up the doctor will refill appropriate medication.  She verbalized understanding and declines any further questions or concerns.     Plan: RN CM will close case.   Jone Baseman, RN, MSN [facility] Care Management Care Management Coordinator Direct Line [phone] Toll Free: [phone]  Fax: [phone]

## 2018-07-30 ENCOUNTER — Ambulatory Visit (INDEPENDENT_AMBULATORY_CARE_PROVIDER_SITE_OTHER): Payer: Self-pay | Admitting: *Deleted

## 2018-07-30 DIAGNOSIS — G459 Transient cerebral ischemic attack, unspecified: Secondary | ICD-10-CM

## 2018-07-31 ENCOUNTER — Other Ambulatory Visit: Payer: Self-pay

## 2018-07-31 ENCOUNTER — Ambulatory Visit (INDEPENDENT_AMBULATORY_CARE_PROVIDER_SITE_OTHER): Payer: MEDICAID | Admitting: Neurology

## 2018-07-31 ENCOUNTER — Encounter: Payer: Self-pay | Admitting: Neurology

## 2018-07-31 VITALS — BP 149/94 | HR 79 | Temp 96.2°F | Ht 64.0 in | Wt 156.6 lb

## 2018-07-31 DIAGNOSIS — I639 Cerebral infarction, unspecified: Secondary | ICD-10-CM

## 2018-07-31 LAB — CUP PACEART REMOTE DEVICE CHECK
Date Time Interrogation Session: 20200713181103
Implantable Pulse Generator Implant Date: 20200610

## 2018-07-31 NOTE — Progress Notes (Signed)
[facility] [address] [place]. [place] [address] [phone]       OFFICE FOLLOW-UP NOTE  Ms. Brooklyn Alfredo Date of Birth:  03-16-1965 Medical Record Number:  [id number]   HPI: [name] is a 53 year old pleasant African-American lady seen today for initial office follow-up visit following hospital admission for stroke in June 2020.  She is accompanied by her daughter.  History is obtained from them, review of electronic medical records and have personally reviewed imaging films in PACS. [name] presented on 06/24/2018 with sudden onset of left facial and left upper extremity numbness and weakness.  She awakened in the morning with the symptoms.  Her left hand felt clumsy and she was dropping objects.  She denied any accompanying headache, speech or visual changes.  CT scan of the head was negative for acute stroke but MRI scan showed right frontal cortical and subcortical MCA branch infarct.  There are mild changes of chronic small vessel disease.  Carotid ultrasound showed no significant extracranial  stenosis.  MRI of the brain showed no significant intracranial stenosis.  2D echo showed normal ejection fraction without cardiac source of embolism.  Transcranial Doppler bubble study was negative for PFO.  TEE was ordered but could not be done for unclear reason.  Hypercoagulable labs were all negative.  ANA and ESR sickle cell and RPR were also negative.  LDL cholesterol was 127 mg percent.  Hemoglobin A1c was 5.7.  HIV was negative.  She was started on aspirin and Plavix for 3 weeks and discharged home.  She states she is done well.  Her left-sided numbness is improved.  She still has some clumsiness and weakness in the left hand does not find it functionally disabling.  She is still on aspirin Plavix and is tolerating it well with only minor bruising.  She has cut back smoking but not quit completely yet.  He plans to see her primary care physician next week.  She states  blood pressure is usually better controlled today it is slightly elevated at 149/94.  She is tolerating Lipitor well without muscle aches and pains.  She has started eating a healthy diet.  She has no new complaints.  She feels she is back to her pre-stroke baseline functioning. ROS:   14 system review of systems is positive for numbness, left hand weakness only and all other systems negative  PMH:  Past Medical History:  Diagnosis Date  . Hypertension   . Stroke [facility])     Social History:  Social History   Socioeconomic History  . Marital status: Single    Spouse name: Not on file  . Number of children: Not on file  . Years of education: Not on file  . Highest education level: Not on file  Occupational History  . Not on file  Social Needs  . Financial resource strain: Not on file  . Food insecurity    Worry: Not on file    Inability: Not on file  . Transportation needs    Medical: Not on file    Non-medical: Not on file  Tobacco Use  . Smoking status: Current Every Day Smoker    Types: Cigars  . Smokeless tobacco: Never Used  Substance and Sexual Activity  . Alcohol use: No  . 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 Sexual activity: Yes  Lifestyle  . Physical activity    Days per week: 0 days  Minutes per session: 0 min  . Stress: Not at all  Relationships  . Social connections    Talks on phone: More than three times a week    Gets together: Once a week    Attends religious service: 1 to 4 times per year    Active member of club or organization: No    Attends meetings of clubs or organizations: Never    Relationship status: Never married  . Intimate partner violence    Fear of current or ex partner: No    Emotionally abused: No    Physically abused: No    Forced sexual activity: No  Other Topics Concern  . Not on file  Social History Narrative  . Not on file    Medications:   Current Outpatient Medications on File Prior to Visit   Medication Sig Dispense Refill  . acyclovir ointment (ZOVIRAX) 5 % Apply topically every 3 (three) hours. 5 g 0  . aspirin EC 81 MG EC tablet Take 1 tablet (81 mg total) by mouth daily.    Marland Kitchen atorvastatin (LIPITOR) 40 MG tablet Take 1 tablet (40 mg total) by mouth daily at 6 PM. 30 tablet 3  . hydrOXYzine (ATARAX/VISTARIL) 25 MG tablet Take 1 tablet (25 mg total) by mouth every 6 (six) hours as needed for anxiety. 30 tablet 0  . loperamide (IMODIUM) 2 MG capsule Take 1-2 capsules (2-4 mg total) by mouth as needed for diarrhea or loose stools (diarrhea). 30 capsule 0  . metoprolol tartrate (LOPRESSOR) 25 MG tablet Take 0.5 tablets (12.5 mg total) by mouth daily. 30 tablet 1  . ondansetron (ZOFRAN-ODT) 4 MG disintegrating tablet Take 1 tablet (4 mg total) by mouth every 6 (six) hours as needed for nausea or vomiting. 20 tablet 0   No current facility-administered medications on file prior to visit.     Allergies:  No Known Allergies  Physical Exam General: well developed, well nourished middle-aged African-American lady, seated, in no evident distress Head: head normocephalic and atraumatic.  Neck: supple with no carotid or supraclavicular bruits Cardiovascular: regular rate and rhythm, no murmurs Musculoskeletal: no deformity Skin:  no rash/petichiae Vascular:  Normal pulses all extremities Vitals:   07/31/18 1104  BP: (!) 149/94  Pulse: 79  Temp: (!) 96.2 F (35.7 C)   Neurologic Exam Mental Status: Awake and fully alert. Oriented to place and time. Recent and remote memory intact. Attention span, concentration and fund of knowledge appropriate. Mood and affect appropriate.  Cranial Nerves: Fundoscopic exam reveals sharp disc margins. Pupils equal, briskly reactive to light. Extraocular movements full without nystagmus. Visual fields full to confrontation. Hearing intact. Facial sensation intact. Face, tongue, palate moves normally and symmetrically.  Motor: Normal bulk and tone.  Normal strength in all tested extremity muscles.  Diminished fine finger movements on the left.  Mild left grip weakness.  Orbits right over left upper extremity. Sensory.: intact to touch ,pinprick .position and vibratory sensation.  Coordination: Rapid alternating movements normal in all extremities. Finger-to-nose and heel-to-shin performed accurately bilaterally. Gait and Station: Arises from chair without difficulty. Stance is normal. Gait demonstrates normal stride length and balance . Able to heel, toe and tandem walk without difficulty.  Reflexes: 1+ and symmetric. Toes downgoing.   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  Vascular risk factors of smoking, hyperlipidemia and hypertension only.    PLAN: I  had a long d/w patient and her daughter about her recent cryptogenic stroke, risk for recurrent stroke/TIAs, personally independently reviewed imaging studies and stroke evaluation results and answered questions.Continue aspirin 81 mg daily and stop plavix now   for secondary stroke prevention and maintain strict control of hypertension with blood pressure goal below 130/90, diabetes with hemoglobin A1c goal below 6.5% and lipids with LDL cholesterol goal below 70 mg/dL. I also advised the patient to eat a healthy diet with plenty of whole grains, cereals, fruits and vegetables, exercise regularly and maintain ideal body weight,She was counselled to quit smoking completely and is agreable. Consider participation in [place] stroke prevention trial if interested. Followup in the future with my nurse practitioner Janett Billow in 3 months or call earlier if needed. Greater than 50% of time during this 25 minute visit was spent on counseling,explanation of diagnosis of cryptogenic stroke, planning of further management, discussion with patient and family and coordination of care  Antony Contras, MD  [facility] [address] [place], [place] [address]  Phone [phone] Fax [phone] Note: This document was prepared with digital dictation and possible smart phrase technology. Any transcriptional errors that result from this process are unintentional

## 2018-07-31 NOTE — Patient Instructions (Signed)
I had a long d/w patient and her daughter about her recent cryptogenic stroke, risk for recurrent stroke/TIAs, personally independently reviewed imaging studies and stroke evaluation results and answered questions.Continue aspirin 81 mg daily and stop plavix now   for secondary stroke prevention and maintain strict control of hypertension with blood pressure goal below 130/90, diabetes with hemoglobin A1c goal below 6.5% and lipids with LDL cholesterol goal below 70 mg/dL. I also advised the patient to eat a healthy diet with plenty of whole grains, cereals, fruits and vegetables, exercise regularly and maintain ideal body weight,She was counselled to quit smoking completely and is agreable. Consider participation in [place] stroke prevention trial if interested. Followup in the future with my nurse practitioner Janett Billow in 3 months or call earlier if needed.  Steps to Quit Smoking Smoking tobacco is the leading cause of preventable death. It can affect almost every organ in the body. Smoking puts you and people around you at risk for many serious, long-lasting (chronic) diseases. Quitting smoking can be hard, but it is one of the best things that you can do for your health. It is never too late to quit. How do I get ready to quit? When you decide to quit smoking, make a plan to help you succeed. Before you quit:  Pick a date to quit. Set a date within the next 2 weeks to give you time to prepare.  Write down the reasons why you are quitting. Keep this list in places where you will see it often.  Tell your family, friends, and co-workers that you are quitting. Their support is important.  Talk with your doctor about the choices that may help you quit.  Find out if your health insurance will pay for these treatments.  Know the people, places, things, and activities that make you want to smoke (triggers). Avoid them. What first steps can I take to quit smoking?  Throw away all cigarettes at home, at  work, and in your car.  Throw away the things that you use when you smoke, such as ashtrays and lighters.  Clean your car. Make sure to empty the ashtray.  Clean your home, including curtains and carpets. What can I do to help me quit smoking? Talk with your doctor about taking medicines and seeing a counselor at the same time. You are more likely to succeed when you do both.  If you are pregnant or breastfeeding, talk with your doctor about counseling or other ways to quit smoking. Do not take medicine to help you quit smoking unless your doctor tells you to do so. To quit smoking: Quit right away  Quit smoking totally, instead of slowly cutting back on how much you smoke over a period of time.  Go to counseling. You are more likely to quit if you go to counseling sessions regularly. Take medicine You may take medicines to help you quit. Some medicines need a prescription, and some you can buy over-the-counter. Some medicines may contain a drug called nicotine to replace the nicotine in cigarettes. Medicines may:  Help you to stop having the desire to smoke (cravings).  Help to stop the problems that come when you stop smoking (withdrawal symptoms). Your doctor may ask you to use:  Nicotine patches, gum, or lozenges.  Nicotine inhalers or sprays.  Non-nicotine medicine that is taken by mouth. Find resources Find resources and other ways to help you quit smoking and remain smoke-free after you quit. These resources are most helpful when you  use them often. They include:  Online chats with a Social worker.  Phone quitlines.  Printed Furniture conservator/restorer.  Support groups or group counseling.  Text messaging programs.  Mobile phone apps. Use apps on your mobile phone or tablet that can help you stick to your quit plan. There are many free apps for mobile phones and tablets as well as websites. Examples include Quit Guide from the [employer] and smokefree.gov  What things can I do to  make it easier to quit?   Talk to your family and friends. Ask them to support and encourage you.  Call a phone quitline (1-800-QUIT-NOW), reach out to support groups, or work with a Social worker.  Ask people who smoke to not smoke around you.  Avoid places that make you want to smoke, such as: ? Bars. ? Parties. ? Smoke-break areas at work.  Spend time with people who do not smoke.  Lower the stress in your life. Stress can make you want to smoke. Try these things to help your stress: ? Getting regular exercise. ? Doing deep-breathing exercises. ? Doing yoga. ? Meditating. ? Doing a body scan. To do this, close your eyes, focus on one area of your body at a time from head to toe. Notice which parts of your body are tense. Try to relax the muscles in those areas. How will I feel when I quit smoking? Day 1 to 3 weeks Within the first 24 hours, you may start to have some problems that come from quitting tobacco. These problems are very bad 2-3 days after you quit, but they do not often last for more than 2-3 weeks. You may get these symptoms:  Mood swings.  Feeling restless, nervous, angry, or annoyed.  Trouble concentrating.  Dizziness.  Strong desire for high-sugar foods and nicotine.  Weight gain.  Trouble pooping (constipation).  Feeling like you may vomit (nausea).  Coughing or a sore throat.  Changes in how the medicines that you take for other issues work in your body.  Depression.  Trouble sleeping (insomnia). Week 3 and afterward After the first 2-3 weeks of quitting, you may start to notice more positive results, such as:  Better sense of smell and taste.  Less coughing and sore throat.  Slower heart rate.  Lower blood pressure.  Clearer skin.  Better breathing.  Fewer sick days. Quitting smoking can be hard. Do not give up if you fail the first time. Some people need to try a few times before they succeed. Do your best to stick to your quit plan,  and talk with your doctor if you have any questions or concerns. Summary  Smoking tobacco is the leading cause of preventable death. Quitting smoking can be hard, but it is one of the best things that you can do for your health.  When you decide to quit smoking, make a plan to help you succeed.  Quit smoking right away, not slowly over a period of time.  When you start quitting, seek help from your doctor, family, or friends. This information is not intended to replace advice given to you by your health care provider. Make sure you discuss any questions you have with your health care provider. Document Released: 10/30/2008 Document Revised: 03/23/2018 Document Reviewed: 03/24/2018 Elsevier Patient Education  [place].  Stroke Prevention Some medical conditions and behaviors are associated with a higher chance of having a stroke. You can help prevent a stroke by making nutrition, lifestyle, and other changes, including managing any  medical conditions you may have. What nutrition changes can be made?   Eat healthy foods. You can do this by: ? Choosing foods high in fiber, such as fresh fruits and vegetables and whole grains. ? Eating at least 5 or more servings of fruits and vegetables a day. Try to fill half of your plate at each meal with fruits and vegetables. ? Choosing lean protein foods, such as lean cuts of meat, poultry without skin, fish, tofu, beans, and nuts. ? Eating low-fat dairy products. ? Avoiding foods that are high in salt (sodium). This can help lower blood pressure. ? Avoiding foods that have saturated fat, trans fat, and cholesterol. This can help prevent high cholesterol. ? Avoiding processed and premade foods.  Follow your health care provider's specific guidelines for losing weight, controlling high blood pressure (hypertension), lowering high cholesterol, and managing diabetes. These may include: ? Reducing your daily calorie intake. ? Limiting your  daily sodium intake to 1,500 milligrams (mg). ? Using only healthy fats for cooking, such as olive oil, canola oil, or sunflower oil. ? Counting your daily carbohydrate intake. What lifestyle changes can be made?  Maintain a healthy weight. Talk to your health care provider about your ideal weight.  Get at least 30 minutes of moderate physical activity at least 5 days a week. Moderate activity includes brisk walking, biking, and swimming.  Do not use any products that contain nicotine or tobacco, such as cigarettes and e-cigarettes. If you need help quitting, ask your health care provider. It may also be helpful to avoid exposure to secondhand smoke.  Limit alcohol intake to no more than 1 drink a day for nonpregnant women and 2 drinks a day for men. One drink equals 12 oz of beer, 5 oz of wine, or 1 oz of hard liquor.  Stop any illegal drug use.  Avoid taking birth control pills. Talk to your health care provider about the risks of taking birth control pills if: ? You are over 93 years old. ? You smoke. ? You get migraines. ? You have ever had a blood clot. What other changes can be made?  Manage your cholesterol levels. ? Eating a healthy diet is important for preventing high cholesterol. If cholesterol cannot be managed through diet alone, you may also need to take medicines. ? Take any prescribed medicines to control your cholesterol as told by your health care provider.  Manage your diabetes. ? Eating a healthy diet and exercising regularly are important parts of managing your blood sugar. If your blood sugar cannot be managed through diet and exercise, you may need to take medicines. ? Take any prescribed medicines to control your diabetes as told by your health care provider.  Control your hypertension. ? To reduce your risk of stroke, try to keep your blood pressure below 130/80. ? Eating a healthy diet and exercising regularly are an important part of controlling your blood  pressure. If your blood pressure cannot be managed through diet and exercise, you may need to take medicines. ? Take any prescribed medicines to control hypertension as told by your health care provider. ? Ask your health care provider if you should monitor your blood pressure at home. ? Have your blood pressure checked every year, even if your blood pressure is normal. Blood pressure increases with age and some medical conditions.  Get evaluated for sleep disorders (sleep apnea). Talk to your health care provider about getting a sleep evaluation if you snore a lot or have  excessive sleepiness.  Take over-the-counter and prescription medicines only as told by your health care provider. Aspirin or blood thinners (antiplatelets or anticoagulants) may be recommended to reduce your risk of forming blood clots that can lead to stroke.  Make sure that any other medical conditions you have, such as atrial fibrillation or atherosclerosis, are managed. What are the warning signs of a stroke? The warning signs of a stroke can be easily remembered as BEFAST.  B is for balance. Signs include: ? Dizziness. ? Loss of balance or coordination. ? Sudden trouble walking.  E is for eyes. Signs include: ? A sudden change in vision. ? Trouble seeing.  F is for face. Signs include: ? Sudden weakness or numbness of the face. ? The face or eyelid drooping to one side.  A is for arms. Signs include: ? Sudden weakness or numbness of the arm, usually on one side of the body.  S is for speech. Signs include: ? Trouble speaking (aphasia). ? Trouble understanding.  T is for time. ? These symptoms may represent a serious problem that is an emergency. Do not wait to see if the symptoms will go away. Get medical help right away. Call your local emergency services (911 in the [place] Do not drive yourself to the hospital.  Other signs of stroke may include: ? A sudden, severe headache with no known cause. ?  Nausea or vomiting. ? Seizure. Where to find more information For more information, visit:  American Stroke Association: www.strokeassociation.org  National Stroke Association: www.stroke.org Summary  You can prevent a stroke by eating healthy, exercising, not smoking, limiting alcohol intake, and managing any medical conditions you may have.  Do not use any products that contain nicotine or tobacco, such as cigarettes and e-cigarettes. If you need help quitting, ask your health care provider. It may also be helpful to avoid exposure to secondhand smoke.  Remember BEFAST for warning signs of stroke. Get help right away if you or a loved one has any of these signs. This information is not intended to replace advice given to you by your health care provider. Make sure you discuss any questions you have with your health care provider. Document Released: 02/11/2004 Document Revised: 12/16/2016 Document Reviewed: 02/09/2016 Elsevier Patient Education  2020 [employer].

## 2018-08-03 ENCOUNTER — Telehealth: Payer: Self-pay

## 2018-08-03 NOTE — Telephone Encounter (Signed)
Left message for patient regarding disconnected monitor.  

## 2018-08-10 NOTE — Progress Notes (Signed)
Carelink Summary Report / Loop Recorder 

## 2018-09-02 LAB — CUP PACEART REMOTE DEVICE CHECK
Date Time Interrogation Session: 20200815184129
Implantable Pulse Generator Implant Date: 20200610

## 2018-09-03 ENCOUNTER — Ambulatory Visit (INDEPENDENT_AMBULATORY_CARE_PROVIDER_SITE_OTHER): Payer: Self-pay | Admitting: *Deleted

## 2018-09-03 DIAGNOSIS — I639 Cerebral infarction, unspecified: Secondary | ICD-10-CM

## 2018-09-04 ENCOUNTER — Other Ambulatory Visit: Payer: Self-pay

## 2018-09-04 ENCOUNTER — Encounter (HOSPITAL_COMMUNITY): Payer: Self-pay

## 2018-09-04 ENCOUNTER — Ambulatory Visit
Admission: RE | Admit: 2018-09-04 | Discharge: 2018-09-04 | Disposition: A | Discharge status: Home / Self Care | Payer: No Typology Code available for payment source | Source: Ambulatory Visit | Attending: Obstetrics and Gynecology | Admitting: Obstetrics and Gynecology

## 2018-09-04 ENCOUNTER — Ambulatory Visit (HOSPITAL_COMMUNITY)
Admission: RE | Admit: 2018-09-04 | Discharge: 2018-09-04 | Disposition: A | Discharge status: Home / Self Care | Payer: Self-pay | Source: Ambulatory Visit | Attending: Obstetrics and Gynecology | Admitting: Obstetrics and Gynecology

## 2018-09-04 DIAGNOSIS — Z1239 Encounter for other screening for malignant neoplasm of breast: Secondary | ICD-10-CM | POA: Insufficient documentation

## 2018-09-04 DIAGNOSIS — Z1231 Encounter for screening mammogram for malignant neoplasm of breast: Secondary | ICD-10-CM

## 2018-09-04 NOTE — Patient Instructions (Signed)
Explained breast self awareness with [name]. Patient did not need a Pap smear today due to last Pap smear and HPV typing was 10/10/2014. Let her know BCCCP will cover Pap smears and HPV typing every 5 years unless has a history of abnormal Pap smears. Referred patient to the [facility] for a screening mammogram. Appointment scheduled for Tuesday, September 04, 2018 at 1050. Patient aware of appointment and will be there. Let patient know the [facility] will follow up with her within the next couple weeks with results of mammogram by letter or phone. Discussed smoking cessation with patient. Referred patient to the [facility] Quitline and gave resources to the free smoking cessation classes at [facility]. Leslie Walker verbalized understanding.  [place], Arvil Chaco, RN 10:25 AM

## 2018-09-04 NOTE — Progress Notes (Signed)
No complaints today.   Pap Smear: Pap smear not completed today. Last Pap smear was 10/10/2014 at [facility] and normal with negative HPV. Per patient has no history of an abnormal Pap smear. Last Pap smear result is in Epic.  Physical exam: Breasts Breasts symmetrical. No skin abnormalities bilateral breasts. No nipple retraction bilateral breasts. No nipple discharge bilateral breasts. No lymphadenopathy. No lumps palpated bilateral breasts. No complaints of pain or tenderness on exam. Referred patient to the [facility] for a screening mammogram. Appointment scheduled for Tuesday, September 04, 2018 at 1050.        Pelvic/Bimanual No Pap smear completed today since last Pap smear and HPV typing was 10/10/2014. Pap smear not indicated per BCCCP guidelines.   Smoking History: Patient is a current smoker. Discussed smoking cessation with patient. Referred patient to the [facility] Quitline and gave resources to the free smoking cessation classes at [facility].   Patient Navigation: Patient education provided. Access to services provided for patient through [facility] program. Spanish interpreter provided.   Colorectal Cancer Screening: Per patient has never had a colonoscopy completed. No complaints today.   Breast and Cervical Cancer Risk Assessment: Patient has no family history of breast cancer, known genetic mutations, or radiation treatment to the chest before age 40. Patient has no history of cervical dysplasia, immunocompromised, or DES exposure in-utero.  Risk Assessment    Risk Scores      09/04/2018   Last edited by: Armond Hang, LPN   5-year risk: 1.3 %   Lifetime risk: 8 %

## 2018-09-06 ENCOUNTER — Encounter (HOSPITAL_COMMUNITY): Payer: Self-pay | Admitting: *Deleted

## 2018-09-12 NOTE — Progress Notes (Signed)
Carelink Summary Report / Loop Recorder 

## 2018-10-01 ENCOUNTER — Ambulatory Visit: Payer: Self-pay

## 2018-10-04 ENCOUNTER — Ambulatory Visit (INDEPENDENT_AMBULATORY_CARE_PROVIDER_SITE_OTHER): Payer: No Typology Code available for payment source | Admitting: *Deleted

## 2018-10-04 DIAGNOSIS — I639 Cerebral infarction, unspecified: Secondary | ICD-10-CM

## 2018-10-05 LAB — CUP PACEART REMOTE DEVICE CHECK
Date Time Interrogation Session: 20200917201143
Implantable Pulse Generator Implant Date: 20200610

## 2018-10-08 NOTE — Progress Notes (Signed)
Carelink Summary Report / Loop Recorder 

## 2018-11-06 ENCOUNTER — Ambulatory Visit: Payer: Self-pay | Admitting: Adult Health

## 2018-11-06 ENCOUNTER — Encounter: Payer: Self-pay | Admitting: Adult Health

## 2018-11-06 ENCOUNTER — Ambulatory Visit (INDEPENDENT_AMBULATORY_CARE_PROVIDER_SITE_OTHER): Payer: No Typology Code available for payment source | Admitting: *Deleted

## 2018-11-06 ENCOUNTER — Telehealth: Payer: Self-pay

## 2018-11-06 DIAGNOSIS — G459 Transient cerebral ischemic attack, unspecified: Secondary | ICD-10-CM

## 2018-11-06 NOTE — Progress Notes (Deleted)
[facility] [address] [place]. [place] [address] [phone]       OFFICE FOLLOW-UP NOTE  [name] Date of Birth:  1965/07/28 Medical Record Number:  [id number]   Reason for visit: Right frontal and subcortical MCA stroke 06/2018  No chief complaint on file.    HPI: Initial visit 07/31/2018 Dr. Leonie Man: [name] presented on 06/24/2018 with sudden onset of left facial and left upper extremity numbness and weakness.  She awakened in the morning with the symptoms.  Her left hand felt clumsy and she was dropping objects.  She denied any accompanying headache, speech or visual changes.  CT scan of the head was negative for acute stroke but MRI scan showed right frontal cortical and subcortical MCA branch infarct.  There are mild changes of chronic small vessel disease.  Carotid ultrasound showed no significant extracranial  stenosis.  MRI of the brain showed no significant intracranial stenosis.  2D echo showed normal ejection fraction without cardiac source of embolism.  Transcranial Doppler bubble study was negative for PFO.  TEE was ordered but could not be done for unclear reason.  Hypercoagulable labs were all negative.  ANA and ESR sickle cell and RPR were also negative.  LDL cholesterol was 127 mg percent.  Hemoglobin A1c was 5.7.  HIV was negative.  She was started on aspirin and Plavix for 3 weeks and discharged home.  She states she is done well.  Her left-sided numbness is improved.  She still has some clumsiness and weakness in the left hand does not find it functionally disabling.  She is still on aspirin Plavix and is tolerating it well with only minor bruising.  She has cut back smoking but not quit completely yet.  He plans to see her primary care physician next week.  She states blood pressure is usually better controlled today it is slightly elevated at 149/94.  She is tolerating Lipitor well without muscle aches and pains.  She has started eating a  healthy diet.  She has no new complaints.  She feels she is back to her pre-stroke baseline functioning.  Update 11/06/2018: [name] is being seen today for stroke follow-up.  She has been stable from a neurological standpoint without residual deficits, reoccurring or new stroke/TIA symptoms.  She has continued on aspirin and atorvastatin for secondary stroke prevention without side effects.  Blood pressure today ***.  Loop recorder has not shown evidence of atrial fibrillation thus far.  No further concerns at this time.     ROS:   14 system review of systems is positive for numbness, left hand weakness only and all other systems negative  PMH:  Past Medical History:  Diagnosis Date   Hypertension    Stroke [facility])     Social History:  Social History   Socioeconomic History   Marital status: Single    Spouse name: Not on file   Number of children: 3   Years of education: Not on file   Highest education level: 12th grade  Occupational History   Not on file  Social Needs   Financial resource strain: Not on file   Food insecurity    Worry: Not on file    Inability: Not on file   Transportation needs    Medical: No    Non-medical: No  Tobacco Use   Smoking status: Current Every Day Smoker    Types: Cigars   Smokeless tobacco: Never Used  Substance and Sexual Activity  Alcohol use: No   Drug use: No    Comment: 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   Sexual activity: Yes  Lifestyle   Physical activity    Days per week: 0 days    Minutes per session: 0 min   Stress: Not at all  Relationships   Social connections    Talks on phone: More than three times a week    Gets together: Once a week    Attends religious service: 1 to 4 times per year    Active member of club or organization: No    Attends meetings of clubs or organizations: Never    Relationship status: Never married   Intimate partner violence    Fear of current or ex partner: No     Emotionally abused: No    Physically abused: No    Forced sexual activity: No  Other Topics Concern   Not on file  Social History Narrative   Not on file    Medications:   Current Outpatient Medications on File Prior to Visit  Medication Sig Dispense Refill   acyclovir ointment (ZOVIRAX) 5 % Apply topically every 3 (three) hours. (Patient not taking: Reported on 09/04/2018) 5 g 0   aspirin EC 81 MG EC tablet Take 1 tablet (81 mg total) by mouth daily.     atorvastatin (LIPITOR) 40 MG tablet Take 1 tablet (40 mg total) by mouth daily at 6 PM. 30 tablet 3   hydrOXYzine (ATARAX/VISTARIL) 25 MG tablet Take 1 tablet (25 mg total) by mouth every 6 (six) hours as needed for anxiety. 30 tablet 0   loperamide (IMODIUM) 2 MG capsule Take 1-2 capsules (2-4 mg total) by mouth as needed for diarrhea or loose stools (diarrhea). (Patient not taking: Reported on 09/04/2018) 30 capsule 0   metoprolol tartrate (LOPRESSOR) 25 MG tablet Take 0.5 tablets (12.5 mg total) by mouth daily. 30 tablet 1   ondansetron (ZOFRAN-ODT) 4 MG disintegrating tablet Take 1 tablet (4 mg total) by mouth every 6 (six) hours as needed for nausea or vomiting. (Patient not taking: Reported on 09/04/2018) 20 tablet 0   No current facility-administered medications on file prior to visit.     Allergies:  No Known Allergies  Physical Exam  There were no vitals filed for this visit. There is no height or weight on file to calculate BMI.    General: well developed, well nourished middle-aged African-American lady, seated, in no evident distress Head: head normocephalic and atraumatic.  Neck: supple with no carotid or supraclavicular bruits Cardiovascular: regular rate and rhythm, no murmurs Musculoskeletal: no deformity Skin:  no rash/petichiae Vascular:  Normal pulses all extremities  Neurologic Exam Mental Status: Awake and fully alert. Oriented to place and time. Recent and remote memory intact. Attention span,  concentration and fund of knowledge appropriate. Mood and affect appropriate.  Cranial Nerves: Fundoscopic exam reveals sharp disc margins. Pupils equal, briskly reactive to light. Extraocular movements full without nystagmus. Visual fields full to confrontation. Hearing intact. Facial sensation intact. Face, tongue, palate moves normally and symmetrically.  Motor: Normal bulk and tone. Normal strength in all tested extremity muscles.  Diminished fine finger movements on the left.  Mild left grip weakness.  Orbits right over left upper extremity. Sensory.: intact to touch ,pinprick .position and vibratory sensation.  Coordination: Rapid alternating movements normal in all extremities. Finger-to-nose and heel-to-shin performed accurately bilaterally. Gait and Station: Arises from chair without difficulty. Stance is normal. Gait demonstrates normal stride length and  balance . Able to heel, toe and tandem walk without difficulty.  Reflexes: 1+ and symmetric. Toes downgoing.   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  Vascular risk factors of smoking, hyperlipidemia and hypertension only.    PLAN: I had a long d/w patient and her daughter about her recent cryptogenic stroke, risk for recurrent stroke/TIAs, personally independently reviewed imaging studies and stroke evaluation results and answered questions. Continue aspirin 81 mg daily and stop plavix now   for secondary stroke prevention and  maintain strict control of hypertension with blood pressure goal below 130/90, diabetes with hemoglobin A1c goal below 6.5% and lipids with LDL cholesterol goal below 70 mg/dL. I also advised the patient to eat a healthy diet with plenty of whole grains, cereals, fruits and vegetables, exercise regularly and maintain ideal body weight She was counselled to quit smoking completely and is  agreable.  Consider participation in [place] stroke prevention trial if interested.  Followup in the future with my nurse practitioner Janett Billow in 3 months or call earlier if needed.  Greater than 50% of time during this 25 minute visit was spent on counseling,explanation of diagnosis of cryptogenic stroke, planning of further management, discussion with patient and family and coordination of care  Frann Rider, [facility]  [facility] [address] [place], [place] [address]  Phone [phone] Fax [phone] Note: This document was prepared with digital dictation and possible smart phrase technology. Any transcriptional errors that result from this process are unintentional.

## 2018-11-06 NOTE — Telephone Encounter (Signed)
Patient was a no call/no show for their appointment today.   

## 2018-11-07 LAB — CUP PACEART REMOTE DEVICE CHECK
Date Time Interrogation Session: 20201020201237
Implantable Pulse Generator Implant Date: 20200610

## 2018-11-22 NOTE — Progress Notes (Signed)
Carelink Summary Report / Loop Recorder 

## 2018-12-10 ENCOUNTER — Ambulatory Visit (INDEPENDENT_AMBULATORY_CARE_PROVIDER_SITE_OTHER): Payer: Self-pay | Admitting: *Deleted

## 2018-12-10 DIAGNOSIS — G459 Transient cerebral ischemic attack, unspecified: Secondary | ICD-10-CM

## 2018-12-10 LAB — CUP PACEART REMOTE DEVICE CHECK
Date Time Interrogation Session: 20201122151425
Implantable Pulse Generator Implant Date: 20200610

## 2019-01-10 ENCOUNTER — Ambulatory Visit (INDEPENDENT_AMBULATORY_CARE_PROVIDER_SITE_OTHER): Payer: Self-pay | Admitting: *Deleted

## 2019-01-10 DIAGNOSIS — G459 Transient cerebral ischemic attack, unspecified: Secondary | ICD-10-CM

## 2019-01-10 LAB — CUP PACEART REMOTE DEVICE CHECK
Date Time Interrogation Session: 20201223231307
Implantable Pulse Generator Implant Date: 20200610

## 2019-01-13 NOTE — Progress Notes (Signed)
ILR remote 

## 2019-01-18 DIAGNOSIS — I639 Cerebral infarction, unspecified: Secondary | ICD-10-CM

## 2019-01-18 HISTORY — DX: Cerebral infarction, unspecified: I63.9

## 2019-02-11 ENCOUNTER — Ambulatory Visit (INDEPENDENT_AMBULATORY_CARE_PROVIDER_SITE_OTHER): Payer: Self-pay | Admitting: *Deleted

## 2019-02-11 DIAGNOSIS — G459 Transient cerebral ischemic attack, unspecified: Secondary | ICD-10-CM

## 2019-02-11 LAB — CUP PACEART REMOTE DEVICE CHECK
Date Time Interrogation Session: 20210124235902
Implantable Pulse Generator Implant Date: 20200610

## 2019-03-14 ENCOUNTER — Ambulatory Visit (INDEPENDENT_AMBULATORY_CARE_PROVIDER_SITE_OTHER): Payer: Self-pay | Admitting: *Deleted

## 2019-03-14 DIAGNOSIS — G459 Transient cerebral ischemic attack, unspecified: Secondary | ICD-10-CM

## 2019-03-14 LAB — CUP PACEART REMOTE DEVICE CHECK
Date Time Interrogation Session: 20210225002013
Implantable Pulse Generator Implant Date: 20200610

## 2019-03-14 NOTE — Progress Notes (Signed)
ILR Remote 

## 2019-04-14 LAB — CUP PACEART REMOTE DEVICE CHECK
Date Time Interrogation Session: 20210328024339
Implantable Pulse Generator Implant Date: 20200610

## 2019-04-15 ENCOUNTER — Ambulatory Visit (INDEPENDENT_AMBULATORY_CARE_PROVIDER_SITE_OTHER): Payer: Self-pay | Admitting: *Deleted

## 2019-04-15 DIAGNOSIS — G459 Transient cerebral ischemic attack, unspecified: Secondary | ICD-10-CM

## 2019-04-15 NOTE — Progress Notes (Signed)
ILR Remote 

## 2019-05-16 LAB — CUP PACEART REMOTE DEVICE CHECK
Date Time Interrogation Session: 20210428231440
Implantable Pulse Generator Implant Date: 20200610

## 2019-05-19 LAB — CUP PACEART REMOTE DEVICE CHECK
Date Time Interrogation Session: 20210502000500
Implantable Pulse Generator Implant Date: 20200610

## 2019-05-20 ENCOUNTER — Ambulatory Visit (INDEPENDENT_AMBULATORY_CARE_PROVIDER_SITE_OTHER): Payer: Self-pay | Admitting: *Deleted

## 2019-05-20 DIAGNOSIS — G459 Transient cerebral ischemic attack, unspecified: Secondary | ICD-10-CM

## 2019-05-21 NOTE — Progress Notes (Signed)
Carelink Summary Report / Loop Recorder 

## 2019-06-18 ENCOUNTER — Ambulatory Visit (INDEPENDENT_AMBULATORY_CARE_PROVIDER_SITE_OTHER): Payer: Self-pay | Admitting: *Deleted

## 2019-06-18 DIAGNOSIS — G459 Transient cerebral ischemic attack, unspecified: Secondary | ICD-10-CM

## 2019-06-19 LAB — CUP PACEART REMOTE DEVICE CHECK
Date Time Interrogation Session: 20210529232817
Implantable Pulse Generator Implant Date: 20200610

## 2019-06-19 NOTE — Progress Notes (Signed)
Carelink Summary Report / Loop Recorder 

## 2019-07-19 ENCOUNTER — Ambulatory Visit (INDEPENDENT_AMBULATORY_CARE_PROVIDER_SITE_OTHER): Payer: Self-pay | Admitting: *Deleted

## 2019-07-19 DIAGNOSIS — G459 Transient cerebral ischemic attack, unspecified: Secondary | ICD-10-CM

## 2019-07-19 LAB — CUP PACEART REMOTE DEVICE CHECK
Date Time Interrogation Session: 20210701232148
Implantable Pulse Generator Implant Date: 20200610

## 2019-07-24 NOTE — Progress Notes (Signed)
Carelink Summary Report / Loop Recorder 

## 2019-08-24 LAB — CUP PACEART REMOTE DEVICE CHECK
Date Time Interrogation Session: 20210803232020
Implantable Pulse Generator Implant Date: 20200610

## 2019-08-26 ENCOUNTER — Ambulatory Visit (INDEPENDENT_AMBULATORY_CARE_PROVIDER_SITE_OTHER): Payer: Self-pay | Admitting: *Deleted

## 2019-08-26 DIAGNOSIS — G459 Transient cerebral ischemic attack, unspecified: Secondary | ICD-10-CM

## 2019-08-27 NOTE — Progress Notes (Signed)
Carelink Summary Report / Loop Recorder 

## 2019-09-25 LAB — CUP PACEART REMOTE DEVICE CHECK
Date Time Interrogation Session: 20210905234027
Implantable Pulse Generator Implant Date: 20200610

## 2019-09-30 ENCOUNTER — Ambulatory Visit (INDEPENDENT_AMBULATORY_CARE_PROVIDER_SITE_OTHER): Payer: Self-pay | Admitting: *Deleted

## 2019-09-30 DIAGNOSIS — G459 Transient cerebral ischemic attack, unspecified: Secondary | ICD-10-CM

## 2019-10-02 NOTE — Progress Notes (Signed)
Carelink Summary Report / Loop Recorder 

## 2019-10-14 ENCOUNTER — Other Ambulatory Visit: Payer: Self-pay | Admitting: Obstetrics and Gynecology

## 2019-10-14 DIAGNOSIS — Z1231 Encounter for screening mammogram for malignant neoplasm of breast: Secondary | ICD-10-CM

## 2019-10-26 LAB — CUP PACEART REMOTE DEVICE CHECK
Date Time Interrogation Session: 20211008234256
Implantable Pulse Generator Implant Date: 20200610

## 2019-11-04 ENCOUNTER — Ambulatory Visit (INDEPENDENT_AMBULATORY_CARE_PROVIDER_SITE_OTHER): Payer: Self-pay

## 2019-11-04 DIAGNOSIS — I639 Cerebral infarction, unspecified: Secondary | ICD-10-CM

## 2019-11-08 NOTE — Progress Notes (Signed)
Carelink Summary Report / Loop Recorder 

## 2019-11-18 ENCOUNTER — Ambulatory Visit: Payer: Self-pay | Attending: Podiatry | Admitting: Podiatry

## 2019-11-18 ENCOUNTER — Other Ambulatory Visit: Payer: Self-pay

## 2019-11-18 DIAGNOSIS — Q828 Other specified congenital malformations of skin: Secondary | ICD-10-CM

## 2019-11-21 ENCOUNTER — Other Ambulatory Visit: Payer: Self-pay

## 2019-11-21 ENCOUNTER — Ambulatory Visit
Admission: RE | Admit: 2019-11-21 | Discharge: 2019-11-21 | Disposition: A | Discharge status: Home / Self Care | Payer: No Typology Code available for payment source | Source: Ambulatory Visit | Attending: Obstetrics and Gynecology | Admitting: Obstetrics and Gynecology

## 2019-11-21 ENCOUNTER — Ambulatory Visit: Payer: No Typology Code available for payment source | Admitting: *Deleted

## 2019-11-21 VITALS — BP 132/88 | Wt 173.2 lb

## 2019-11-21 DIAGNOSIS — Z01419 Encounter for gynecological examination (general) (routine) without abnormal findings: Secondary | ICD-10-CM

## 2019-11-21 DIAGNOSIS — Z1231 Encounter for screening mammogram for malignant neoplasm of breast: Secondary | ICD-10-CM

## 2019-11-21 DIAGNOSIS — Z1211 Encounter for screening for malignant neoplasm of colon: Secondary | ICD-10-CM

## 2019-11-21 NOTE — Progress Notes (Signed)
[name] is a 54 y.o. G14P2013 female who presents to [facility] clinic today with no complaints.    Pap Smear: Pap smear completed today. Last Pap smear was 10/10/2014 at [facility] and normal with negative HPV. Per patient has no history of an abnormal Pap smear. Last Pap smear result is in Epic.   Physical exam: Breasts Breasts symmetrical. No skin abnormalities bilateral breasts. No nipple retraction bilateral breasts. No nipple discharge bilateral breasts. No lymphadenopathy. No lumps palpated bilateral breasts. No complaints of pain or tenderness on exam.       Pelvic/Bimanual Ext Genitalia No lesions, no swelling and no discharge observed on external genitalia.        Vagina Vagina pink and normal texture. No lesions and frothy white discharge observed in vagina. Wet prep completed.      Cervix Cervix is present. Cervix pink and of normal texture. Frothy white discharge observed on cervix.   Uterus Uterus is present and palpable. Uterus in normal position and normal size.        Adnexae Bilateral ovaries present and palpable. No tenderness on palpation.         Rectovaginal No rectal exam completed today since patient had no rectal complaints. No skin abnormalities observed on exam.     Smoking History: Patientis a current smoker. Discussed smoking cessation with patient. Referred patient to the [facility] Quitline and gave resources to the free smoking cessation classes at [facility].   Patient Navigation: Patient education provided. Access to services provided for patient through [place] program.   Colorectal Cancer Screening: Per patient has never had colonoscopy completed. FIT Test given to patient to complete. No complaints today.    Breast and Cervical Cancer Risk Assessment: Patient does not have family history of breast cancer, known genetic mutations, or radiation treatment to the chest before age 40. Patient does not have history of cervical dysplasia,  immunocompromised, or DES exposure in-utero.  Risk Assessment    Risk Scores      11/21/2019 09/04/2018   Last edited by: Leslie Walker, CMA Leslie Infante H, LPN   5-year risk: 1.4 % 1.3 %   Lifetime risk: 7.8 % 8 %          A: [facility] exam with pap smear No complaints.  P: Referred patient to the [facility] for a screening mammogram on the mobile unit. Appointment scheduled Thursday, November 21, 2019 at 1330.  Leslie Parish, RN 11/21/2019 12:29 PM

## 2019-11-21 NOTE — Patient Instructions (Addendum)
Explained breast self awareness with Modine Peragine. Pap smear completed today. Let her know [facility]CP will cover Pap smears and HPV typing every 5 years unless has a history of abnormal Pap smears. Referred patient to the [facility] for a screening mammogram on the mobile unit. Appointment scheduled Thursday, November 21, 2019 at 1330. Patient escorted to the mobile unit following [facility] appointment for her screening mammogram. Let patient know will follow up with her within the next couple weeks with results of her Pap smear by letter or phone. Informed patient that the [facility] will follow-up with her within the next couple of weeks with results of her mammogram by letter or phone. Discussed smoking cessation with patient. Referred patient to the [facility] Quitline and gave resources to the free smoking cessation classes at [facility]. Ani Asselin verbalized understanding.  Ginamarie Banfield, Arvil Chaco, RN 12:29 PM

## 2019-11-22 LAB — CYTOLOGY - PAP
Adequacy: ABSENT
Comment: NEGATIVE
Diagnosis: NEGATIVE
High risk HPV: NEGATIVE

## 2019-11-22 NOTE — Progress Notes (Signed)
Subjective: 53 year old female presents the office today for concerns of thick callus on her right foot.  She denies any open sores or drainage or any swelling or pus.  She has no other concerns today. Denies any systemic complaints such as fevers, chills, nausea, vomiting. No acute changes since last appointment, and no other complaints at this time.   Objective: AAO x3, NAD DP/PT pulses palpable bilaterally, CRT less than 3 seconds Hyperkeratotic lesion right lateral foot.  No underlying ulceration drainage or signs of infection noted today.  No open lesions.  Nails are hypertrophic, dystrophic and elongated. No open lesions or pre-ulcerative lesions.  No pain with calf compression, swelling, warmth, erythema  Assessment: Symptomatic onychomycosis, hyperkeratotic lesion  Plan: -All treatment options discussed with the patient including all alternatives, risks, complications.  -Sharply debrided the hyperkeratotic lesion without any complications or bleeding.  Discussed moisturizer to the callus daily.   -Was unable to debride the nails today.  Have her come the office on doing for her. -Patient encouraged to call the office with any questions, concerns, change in symptoms.   Trula Slade DPM

## 2019-11-25 ENCOUNTER — Other Ambulatory Visit: Payer: Self-pay

## 2019-11-25 ENCOUNTER — Telehealth: Payer: Self-pay

## 2019-11-25 LAB — CERVICOVAGINAL ANCILLARY ONLY
Bacterial Vaginitis (gardnerella): POSITIVE — AB
Candida Glabrata: NEGATIVE
Candida Vaginitis: NEGATIVE
Comment: NEGATIVE
Comment: NEGATIVE
Comment: NEGATIVE
Comment: NEGATIVE
Trichomonas: POSITIVE — AB

## 2019-11-25 MED ORDER — METRONIDAZOLE 500 MG PO TABS: 500.0000 mg | ORAL_TABLET | Freq: Two times a day (BID) | ORAL | 0 refills | Status: DC

## 2019-11-25 NOTE — Telephone Encounter (Signed)
Patient returned call, was informed Pap/HPV-negative, Wet prep positive for Trichomonas and Bacterial vaginitis. Rx Metronidazole sent to [facility] [address], needs to have partner seek treatment, refrain from sexual activity, avoid alcohol until completion of medication.  Patient verbalized understanding.

## 2019-11-25 NOTE — Telephone Encounter (Signed)
Attempted to contact patient regarding pap test/wet prep results. Left message on voicemail requesting return call.

## 2019-11-25 NOTE — Progress Notes (Signed)
Needs rx Flagyl. I want to verify this medication is okay with patient's current medication regimen.  Thank you,  Marjory Lies, LPN

## 2019-11-27 ENCOUNTER — Other Ambulatory Visit: Payer: Self-pay | Admitting: Obstetrics and Gynecology

## 2019-11-27 DIAGNOSIS — R928 Other abnormal and inconclusive findings on diagnostic imaging of breast: Secondary | ICD-10-CM

## 2019-12-09 ENCOUNTER — Ambulatory Visit (INDEPENDENT_AMBULATORY_CARE_PROVIDER_SITE_OTHER): Payer: Self-pay

## 2019-12-09 DIAGNOSIS — G459 Transient cerebral ischemic attack, unspecified: Secondary | ICD-10-CM

## 2019-12-09 LAB — CUP PACEART REMOTE DEVICE CHECK
Date Time Interrogation Session: 20211121231921
Implantable Pulse Generator Implant Date: 20200610

## 2019-12-11 NOTE — Progress Notes (Signed)
Carelink Summary Report / Loop Recorder 

## 2020-01-13 ENCOUNTER — Ambulatory Visit (INDEPENDENT_AMBULATORY_CARE_PROVIDER_SITE_OTHER): Payer: Self-pay

## 2020-01-13 DIAGNOSIS — G459 Transient cerebral ischemic attack, unspecified: Secondary | ICD-10-CM

## 2020-01-13 LAB — CUP PACEART REMOTE DEVICE CHECK
Date Time Interrogation Session: 20211224232023
Implantable Pulse Generator Implant Date: 20200610

## 2020-01-23 ENCOUNTER — Other Ambulatory Visit: Payer: Self-pay

## 2020-01-23 ENCOUNTER — Ambulatory Visit
Admission: RE | Admit: 2020-01-23 | Discharge: 2020-01-23 | Disposition: A | Discharge status: Home / Self Care | Payer: No Typology Code available for payment source | Source: Ambulatory Visit | Attending: Obstetrics and Gynecology | Admitting: Obstetrics and Gynecology

## 2020-01-23 DIAGNOSIS — R928 Other abnormal and inconclusive findings on diagnostic imaging of breast: Secondary | ICD-10-CM

## 2020-01-27 NOTE — Progress Notes (Signed)
Carelink Summary Report / Loop Recorder 

## 2020-02-15 LAB — CUP PACEART REMOTE DEVICE CHECK
Date Time Interrogation Session: 20220126232747
Implantable Pulse Generator Implant Date: 20200610

## 2020-02-17 ENCOUNTER — Ambulatory Visit (INDEPENDENT_AMBULATORY_CARE_PROVIDER_SITE_OTHER): Payer: Self-pay

## 2020-02-17 DIAGNOSIS — G459 Transient cerebral ischemic attack, unspecified: Secondary | ICD-10-CM

## 2020-02-26 NOTE — Progress Notes (Signed)
Carelink Summary Report / Loop Recorder 

## 2020-03-23 ENCOUNTER — Ambulatory Visit (INDEPENDENT_AMBULATORY_CARE_PROVIDER_SITE_OTHER): Payer: Self-pay

## 2020-03-23 DIAGNOSIS — G459 Transient cerebral ischemic attack, unspecified: Secondary | ICD-10-CM

## 2020-03-25 LAB — CUP PACEART REMOTE DEVICE CHECK
Date Time Interrogation Session: 20220228233457
Implantable Pulse Generator Implant Date: 20200610

## 2020-04-01 NOTE — Progress Notes (Signed)
Carelink Summary Report / Loop Recorder 

## 2020-04-25 LAB — CUP PACEART REMOTE DEVICE CHECK
Date Time Interrogation Session: 20220407000500
Implantable Pulse Generator Implant Date: 20200610

## 2020-04-27 ENCOUNTER — Ambulatory Visit (INDEPENDENT_AMBULATORY_CARE_PROVIDER_SITE_OTHER): Payer: Self-pay

## 2020-04-27 DIAGNOSIS — G459 Transient cerebral ischemic attack, unspecified: Secondary | ICD-10-CM

## 2020-05-12 NOTE — Progress Notes (Signed)
Carelink Summary Report / Loop Recorder 

## 2020-06-01 ENCOUNTER — Ambulatory Visit (INDEPENDENT_AMBULATORY_CARE_PROVIDER_SITE_OTHER): Payer: Self-pay

## 2020-06-01 DIAGNOSIS — Z95818 Presence of other cardiac implants and grafts: Secondary | ICD-10-CM

## 2020-06-01 DIAGNOSIS — G459 Transient cerebral ischemic attack, unspecified: Secondary | ICD-10-CM

## 2020-06-02 LAB — CUP PACEART REMOTE DEVICE CHECK
Date Time Interrogation Session: 20220514231243
Implantable Pulse Generator Implant Date: 20200610

## 2020-06-24 NOTE — Progress Notes (Signed)
Carelink Summary Report / Loop Recorder 

## 2020-07-06 ENCOUNTER — Ambulatory Visit (INDEPENDENT_AMBULATORY_CARE_PROVIDER_SITE_OTHER): Payer: Self-pay

## 2020-07-06 DIAGNOSIS — G459 Transient cerebral ischemic attack, unspecified: Secondary | ICD-10-CM

## 2020-07-06 LAB — CUP PACEART REMOTE DEVICE CHECK
Date Time Interrogation Session: 20220616232142
Implantable Pulse Generator Implant Date: 20200610

## 2020-07-27 NOTE — Progress Notes (Signed)
Carelink Summary Report / Loop Recorder 

## 2020-08-06 LAB — CUP PACEART REMOTE DEVICE CHECK
Date Time Interrogation Session: 20220719233303
Implantable Pulse Generator Implant Date: 20200610

## 2020-08-10 ENCOUNTER — Ambulatory Visit (INDEPENDENT_AMBULATORY_CARE_PROVIDER_SITE_OTHER): Payer: Self-pay

## 2020-08-10 DIAGNOSIS — I639 Cerebral infarction, unspecified: Secondary | ICD-10-CM

## 2020-09-04 NOTE — Progress Notes (Signed)
Carelink Summary Report / Loop Recorder 

## 2020-09-14 ENCOUNTER — Ambulatory Visit (INDEPENDENT_AMBULATORY_CARE_PROVIDER_SITE_OTHER): Payer: Self-pay

## 2020-09-14 DIAGNOSIS — I639 Cerebral infarction, unspecified: Secondary | ICD-10-CM

## 2020-09-15 LAB — CUP PACEART REMOTE DEVICE CHECK
Date Time Interrogation Session: 20220822000803
Implantable Pulse Generator Implant Date: 20200610

## 2020-09-25 NOTE — Progress Notes (Signed)
Carelink Summary Report / Loop Recorder 

## 2020-10-08 ENCOUNTER — Other Ambulatory Visit: Payer: Self-pay

## 2020-10-08 DIAGNOSIS — F17210 Nicotine dependence, cigarettes, uncomplicated: Secondary | ICD-10-CM | POA: Diagnosis present

## 2020-10-08 DIAGNOSIS — I214 Non-ST elevation (NSTEMI) myocardial infarction: Principal | ICD-10-CM | POA: Diagnosis present

## 2020-10-08 DIAGNOSIS — Z716 Tobacco abuse counseling: Secondary | ICD-10-CM

## 2020-10-08 DIAGNOSIS — Z955 Presence of coronary angioplasty implant and graft: Secondary | ICD-10-CM

## 2020-10-08 DIAGNOSIS — Z20822 Contact with and (suspected) exposure to covid-19: Secondary | ICD-10-CM | POA: Diagnosis present

## 2020-10-08 DIAGNOSIS — I5023 Acute on chronic systolic (congestive) heart failure: Secondary | ICD-10-CM | POA: Diagnosis present

## 2020-10-08 DIAGNOSIS — I255 Ischemic cardiomyopathy: Secondary | ICD-10-CM | POA: Diagnosis present

## 2020-10-08 DIAGNOSIS — I701 Atherosclerosis of renal artery: Secondary | ICD-10-CM | POA: Diagnosis present

## 2020-10-08 DIAGNOSIS — I110 Hypertensive heart disease with heart failure: Secondary | ICD-10-CM | POA: Diagnosis present

## 2020-10-08 DIAGNOSIS — F1410 Cocaine abuse, uncomplicated: Secondary | ICD-10-CM | POA: Diagnosis present

## 2020-10-08 DIAGNOSIS — Z8673 Personal history of transient ischemic attack (TIA), and cerebral infarction without residual deficits: Secondary | ICD-10-CM

## 2020-10-08 DIAGNOSIS — I25110 Atherosclerotic heart disease of native coronary artery with unstable angina pectoris: Secondary | ICD-10-CM | POA: Diagnosis present

## 2020-10-08 DIAGNOSIS — R911 Solitary pulmonary nodule: Secondary | ICD-10-CM | POA: Diagnosis present

## 2020-10-08 DIAGNOSIS — Z7151 Drug abuse counseling and surveillance of drug abuser: Secondary | ICD-10-CM

## 2020-10-08 DIAGNOSIS — Z8249 Family history of ischemic heart disease and other diseases of the circulatory system: Secondary | ICD-10-CM

## 2020-10-08 DIAGNOSIS — E785 Hyperlipidemia, unspecified: Secondary | ICD-10-CM | POA: Diagnosis present

## 2020-10-08 DIAGNOSIS — Z7982 Long term (current) use of aspirin: Secondary | ICD-10-CM

## 2020-10-09 ENCOUNTER — Encounter (HOSPITAL_COMMUNITY): Payer: Self-pay | Admitting: Emergency Medicine

## 2020-10-09 ENCOUNTER — Other Ambulatory Visit: Payer: Self-pay

## 2020-10-09 ENCOUNTER — Emergency Department (HOSPITAL_COMMUNITY): Payer: No Typology Code available for payment source

## 2020-10-09 ENCOUNTER — Inpatient Hospital Stay (HOSPITAL_COMMUNITY)
Admission: EM | Admit: 2020-10-09 | Discharge: 2020-10-11 | DRG: 246 | Disposition: A | Discharge status: Home / Self Care | Payer: Self-pay | Attending: Internal Medicine | Admitting: Internal Medicine

## 2020-10-09 ENCOUNTER — Encounter (HOSPITAL_COMMUNITY)
Admission: EM | Disposition: A | Discharge status: Home / Self Care | Payer: Self-pay | Source: Home / Self Care | Attending: Internal Medicine

## 2020-10-09 ENCOUNTER — Observation Stay (HOSPITAL_COMMUNITY): Payer: No Typology Code available for payment source

## 2020-10-09 DIAGNOSIS — R072 Precordial pain: Secondary | ICD-10-CM | POA: Diagnosis present

## 2020-10-09 DIAGNOSIS — I10 Essential (primary) hypertension: Secondary | ICD-10-CM | POA: Diagnosis present

## 2020-10-09 DIAGNOSIS — I214 Non-ST elevation (NSTEMI) myocardial infarction: Secondary | ICD-10-CM | POA: Diagnosis present

## 2020-10-09 DIAGNOSIS — R911 Solitary pulmonary nodule: Secondary | ICD-10-CM | POA: Diagnosis present

## 2020-10-09 DIAGNOSIS — Z955 Presence of coronary angioplasty implant and graft: Secondary | ICD-10-CM

## 2020-10-09 DIAGNOSIS — I2510 Atherosclerotic heart disease of native coronary artery without angina pectoris: Secondary | ICD-10-CM

## 2020-10-09 DIAGNOSIS — E785 Hyperlipidemia, unspecified: Secondary | ICD-10-CM | POA: Diagnosis present

## 2020-10-09 DIAGNOSIS — F17200 Nicotine dependence, unspecified, uncomplicated: Secondary | ICD-10-CM | POA: Diagnosis present

## 2020-10-09 DIAGNOSIS — F1910 Other psychoactive substance abuse, uncomplicated: Secondary | ICD-10-CM | POA: Diagnosis present

## 2020-10-09 DIAGNOSIS — I200 Unstable angina: Secondary | ICD-10-CM

## 2020-10-09 DIAGNOSIS — R079 Chest pain, unspecified: Secondary | ICD-10-CM

## 2020-10-09 DIAGNOSIS — I5022 Chronic systolic (congestive) heart failure: Secondary | ICD-10-CM

## 2020-10-09 DIAGNOSIS — F1490 Cocaine use, unspecified, uncomplicated: Secondary | ICD-10-CM

## 2020-10-09 HISTORY — PX: LEFT HEART CATH AND CORONARY ANGIOGRAPHY: CATH118249

## 2020-10-09 HISTORY — PX: INTRAVASCULAR PRESSURE WIRE/FFR STUDY: CATH118243

## 2020-10-09 HISTORY — PX: CORONARY STENT INTERVENTION: CATH118234

## 2020-10-09 HISTORY — PX: CORONARY PRESSURE/FFR STUDY: CATH118243

## 2020-10-09 HISTORY — DX: Patient's other noncompliance with medication regimen: Z91.14

## 2020-10-09 HISTORY — DX: Patient's other noncompliance with medication regimen for other reason: Z91.148

## 2020-10-09 HISTORY — DX: Cocaine abuse, uncomplicated: F14.10

## 2020-10-09 HISTORY — DX: Hyperlipidemia, unspecified: E78.5

## 2020-10-09 LAB — LIPID PANEL
Cholesterol: 233 mg/dL — ABNORMAL HIGH (ref 0–200)
HDL: 56 mg/dL (ref >40)
LDL Cholesterol: 157 mg/dL — ABNORMAL HIGH (ref 0–99)
Total CHOL/HDL Ratio: 4.2 RATIO
Triglycerides: 99 mg/dL (ref <150)
VLDL: 20 mg/dL (ref 0–40)

## 2020-10-09 LAB — COMPREHENSIVE METABOLIC PANEL
ALT: 13 U/L (ref 0–44)
AST: 25 U/L (ref 15–41)
Albumin: 3.3 g/dL — ABNORMAL LOW (ref 3.5–5.0)
Alkaline Phosphatase: 72 U/L (ref 38–126)
Anion gap: 8 (ref 5–15)
BUN: 11 mg/dL (ref 6–20)
CO2: 29 mmol/L (ref 22–32)
Calcium: 9 mg/dL (ref 8.9–10.3)
Chloride: 104 mmol/L (ref 98–111)
Creatinine, Ser: 1.19 mg/dL — ABNORMAL HIGH (ref 0.44–1.00)
GFR, Estimated: 54 mL/min — ABNORMAL LOW (ref >60)
Glucose, Bld: 117 mg/dL — ABNORMAL HIGH (ref 70–99)
Potassium: 4.8 mmol/L (ref 3.5–5.1)
Sodium: 141 mmol/L (ref 135–145)
Total Bilirubin: 0.2 mg/dL — ABNORMAL LOW (ref 0.3–1.2)
Total Protein: 6.4 g/dL — ABNORMAL LOW (ref 6.5–8.1)

## 2020-10-09 LAB — TROPONIN I (HIGH SENSITIVITY)
Troponin I (High Sensitivity): 77 ng/L — ABNORMAL HIGH (ref <18)
Troponin I (High Sensitivity): 100 ng/L (ref <18)
Troponin I (High Sensitivity): 78 ng/L — ABNORMAL HIGH (ref <18)
Troponin I (High Sensitivity): 73 ng/L — ABNORMAL HIGH (ref <18)

## 2020-10-09 LAB — CBC WITH DIFFERENTIAL/PLATELET
Abs Immature Granulocytes: 0.01 10*3/uL (ref 0.00–0.07)
Basophils Absolute: 0.1 10*3/uL (ref 0.0–0.1)
Basophils Relative: 1 %
Eosinophils Absolute: 0.3 10*3/uL (ref 0.0–0.5)
Eosinophils Relative: 4 %
HCT: 38.1 % (ref 36.0–46.0)
Hemoglobin: 13.9 g/dL (ref 12.0–15.0)
Immature Granulocytes: 0 %
Lymphocytes Relative: 65 %
Lymphs Abs: 4.4 10*3/uL — ABNORMAL HIGH (ref 0.7–4.0)
MCH: 30 pg (ref 26.0–34.0)
MCHC: 36.5 g/dL — ABNORMAL HIGH (ref 30.0–36.0)
MCV: 82.3 fL (ref 80.0–100.0)
Monocytes Absolute: 0.5 10*3/uL (ref 0.1–1.0)
Monocytes Relative: 7 %
Neutro Abs: 1.6 10*3/uL — ABNORMAL LOW (ref 1.7–7.7)
Neutrophils Relative %: 23 %
Platelets: 212 10*3/uL (ref 150–400)
RBC: 4.63 MIL/uL (ref 3.87–5.11)
RDW: 13.6 % (ref 11.5–15.5)
WBC: 6.7 10*3/uL (ref 4.0–10.5)
nRBC: 0 % (ref 0.0–0.2)

## 2020-10-09 LAB — HEMOGLOBIN A1C
Hgb A1c MFr Bld: 5.6 % (ref 4.8–5.6)
Mean Plasma Glucose: 114.02 mg/dL

## 2020-10-09 LAB — RESP PANEL BY RT-PCR (FLU A&B, COVID) ARPGX2
Influenza A by PCR: NEGATIVE
Influenza B by PCR: NEGATIVE
SARS Coronavirus 2 by RT PCR: NEGATIVE

## 2020-10-09 LAB — RAPID URINE DRUG SCREEN, HOSP PERFORMED
Amphetamines: NOT DETECTED
Barbiturates: NOT DETECTED
Benzodiazepines: NOT DETECTED
Cocaine: POSITIVE — AB
Opiates: POSITIVE — AB
Tetrahydrocannabinol: POSITIVE — AB

## 2020-10-09 LAB — POCT ACTIVATED CLOTTING TIME
Activated Clotting Time: 248 seconds
Activated Clotting Time: 294 seconds
Activated Clotting Time: 300 seconds

## 2020-10-09 LAB — LIPASE, BLOOD: Lipase: 18 U/L (ref 11–51)

## 2020-10-09 LAB — BRAIN NATRIURETIC PEPTIDE: B Natriuretic Peptide: 304.5 pg/mL — ABNORMAL HIGH (ref 0.0–100.0)

## 2020-10-09 LAB — HIV ANTIBODY (ROUTINE TESTING W REFLEX): HIV Screen 4th Generation wRfx: NONREACTIVE

## 2020-10-09 SURGERY — LEFT HEART CATH AND CORONARY ANGIOGRAPHY
Anesthesia: LOCAL

## 2020-10-09 MED ORDER — FENTANYL CITRATE (PF) 100 MCG/2ML IJ SOLN
INTRAMUSCULAR | Status: AC
  Filled 2020-10-09: qty 2

## 2020-10-09 MED ORDER — HYDRALAZINE HCL 20 MG/ML IJ SOLN
5.0000 mg | Freq: Once | INTRAMUSCULAR | Status: AC
  Administered 2020-10-09: 5 mg via INTRAVENOUS
  Filled 2020-10-09: qty 1

## 2020-10-09 MED ORDER — MIDAZOLAM HCL 2 MG/2ML IJ SOLN
INTRAMUSCULAR | Status: AC
  Filled 2020-10-09: qty 2

## 2020-10-09 MED ORDER — ACETAMINOPHEN 325 MG PO TABS
650.0000 mg | ORAL_TABLET | ORAL | Status: DC | PRN
  Administered 2020-10-09 – 2020-10-10 (×2): 650 mg via ORAL
  Filled 2020-10-09 (×2): qty 2

## 2020-10-09 MED ORDER — NITROGLYCERIN 1 MG/10 ML FOR IR/CATH LAB
INTRA_ARTERIAL | Status: AC
  Filled 2020-10-09: qty 10

## 2020-10-09 MED ORDER — NITROGLYCERIN IN D5W 200-5 MCG/ML-% IV SOLN
INTRAVENOUS | Status: AC | PRN
  Administered 2020-10-09: 20 ug/min via INTRAVENOUS

## 2020-10-09 MED ORDER — HEPARIN (PORCINE) IN NACL 1000-0.9 UT/500ML-% IV SOLN
INTRAVENOUS | Status: AC
  Filled 2020-10-09: qty 1000

## 2020-10-09 MED ORDER — LIDOCAINE HCL (PF) 1 % IJ SOLN
INTRAMUSCULAR | Status: AC
  Filled 2020-10-09: qty 30

## 2020-10-09 MED ORDER — ENOXAPARIN SODIUM 40 MG/0.4ML IJ SOSY
40.0000 mg | PREFILLED_SYRINGE | INTRAMUSCULAR | Status: DC
  Administered 2020-10-10 – 2020-10-11 (×2): 40 mg via SUBCUTANEOUS
  Filled 2020-10-09 (×2): qty 0.4

## 2020-10-09 MED ORDER — VERAPAMIL HCL 2.5 MG/ML IV SOLN
INTRAVENOUS | Status: AC
  Filled 2020-10-09: qty 2

## 2020-10-09 MED ORDER — ONDANSETRON HCL 4 MG/2ML IJ SOLN: 4.0000 mg | Freq: Four times a day (QID) | INTRAMUSCULAR | Status: DC | PRN

## 2020-10-09 MED ORDER — HEPARIN SODIUM (PORCINE) 1000 UNIT/ML IJ SOLN
INTRAMUSCULAR | Status: AC
  Filled 2020-10-09: qty 1

## 2020-10-09 MED ORDER — FENTANYL CITRATE (PF) 100 MCG/2ML IJ SOLN
INTRAMUSCULAR | Status: DC | PRN
  Administered 2020-10-09 (×2): 25 ug via INTRAVENOUS

## 2020-10-09 MED ORDER — LIDOCAINE HCL (PF) 1 % IJ SOLN
INTRAMUSCULAR | Status: DC | PRN
  Administered 2020-10-09: 2 mL

## 2020-10-09 MED ORDER — NITROGLYCERIN 1 MG/10 ML FOR IR/CATH LAB
INTRA_ARTERIAL | Status: DC | PRN
  Administered 2020-10-09: 200 ug via INTRACORONARY

## 2020-10-09 MED ORDER — ACETAMINOPHEN 500 MG PO TABS
1000.0000 mg | ORAL_TABLET | Freq: Once | ORAL | Status: AC
  Administered 2020-10-09: 1000 mg via ORAL
  Filled 2020-10-09: qty 2

## 2020-10-09 MED ORDER — METOPROLOL TARTRATE 25 MG PO TABS
12.5000 mg | ORAL_TABLET | Freq: Every day | ORAL | Status: DC
  Administered 2020-10-09: 12.5 mg via ORAL
  Filled 2020-10-09: qty 1

## 2020-10-09 MED ORDER — ASPIRIN EC 81 MG PO TBEC
81.0000 mg | DELAYED_RELEASE_TABLET | Freq: Every day | ORAL | Status: DC
  Administered 2020-10-09 – 2020-10-11 (×3): 81 mg via ORAL
  Filled 2020-10-09 (×3): qty 1

## 2020-10-09 MED ORDER — NITROGLYCERIN IN D5W 200-5 MCG/ML-% IV SOLN: 40.0000 ug/min | INTRAVENOUS | Status: DC

## 2020-10-09 MED ORDER — NITROGLYCERIN 2 % TD OINT
1.0000 [in_us] | TOPICAL_OINTMENT | Freq: Once | TRANSDERMAL | Status: AC
  Administered 2020-10-09: 1 [in_us] via TOPICAL
  Filled 2020-10-09: qty 1

## 2020-10-09 MED ORDER — SODIUM CHLORIDE 0.9 % IV SOLN: INTRAVENOUS | Status: AC

## 2020-10-09 MED ORDER — HEPARIN (PORCINE) IN NACL 1000-0.9 UT/500ML-% IV SOLN
INTRAVENOUS | Status: DC | PRN
  Administered 2020-10-09 (×2): 500 mL

## 2020-10-09 MED ORDER — SODIUM CHLORIDE 0.9% FLUSH
3.0000 mL | Freq: Two times a day (BID) | INTRAVENOUS | Status: DC
  Administered 2020-10-09 – 2020-10-10 (×2): 3 mL via INTRAVENOUS

## 2020-10-09 MED ORDER — TICAGRELOR 90 MG PO TABS
ORAL_TABLET | ORAL | Status: DC | PRN
  Administered 2020-10-09: 180 mg via ORAL

## 2020-10-09 MED ORDER — OXYCODONE HCL 5 MG PO TABS: 5.0000 mg | ORAL_TABLET | ORAL | Status: DC | PRN

## 2020-10-09 MED ORDER — METOPROLOL TARTRATE 50 MG PO TABS
50.0000 mg | ORAL_TABLET | Freq: Two times a day (BID) | ORAL | Status: DC
  Administered 2020-10-10 – 2020-10-11 (×3): 50 mg via ORAL
  Filled 2020-10-09 (×4): qty 1

## 2020-10-09 MED ORDER — ASPIRIN 81 MG PO CHEW
324.0000 mg | CHEWABLE_TABLET | Freq: Once | ORAL | Status: AC
  Administered 2020-10-09: 324 mg via ORAL
  Filled 2020-10-09: qty 4

## 2020-10-09 MED ORDER — IOHEXOL 350 MG/ML SOLN
INTRAVENOUS | Status: DC | PRN
  Administered 2020-10-09: 160 mL

## 2020-10-09 MED ORDER — NICOTINE 14 MG/24HR TD PT24: 14.0000 mg | MEDICATED_PATCH | Freq: Every day | TRANSDERMAL | Status: DC

## 2020-10-09 MED ORDER — ASPIRIN 81 MG PO CHEW: 324.0000 mg | CHEWABLE_TABLET | Freq: Once | ORAL | Status: DC

## 2020-10-09 MED ORDER — HEPARIN SODIUM (PORCINE) 1000 UNIT/ML IJ SOLN
INTRAMUSCULAR | Status: DC | PRN
  Administered 2020-10-09: 3000 [IU] via INTRAVENOUS
  Administered 2020-10-09: 2500 [IU] via INTRAVENOUS
  Administered 2020-10-09: 3000 [IU] via INTRAVENOUS
  Administered 2020-10-09: 3500 [IU] via INTRAVENOUS

## 2020-10-09 MED ORDER — TICAGRELOR 90 MG PO TABS
90.0000 mg | ORAL_TABLET | Freq: Two times a day (BID) | ORAL | Status: DC
  Administered 2020-10-10 – 2020-10-11 (×4): 90 mg via ORAL
  Filled 2020-10-09 (×4): qty 1

## 2020-10-09 MED ORDER — SODIUM CHLORIDE 0.9 % IV SOLN
INTRAVENOUS | Status: AC | PRN
  Administered 2020-10-09: 50 mL/h via INTRAVENOUS

## 2020-10-09 MED ORDER — ENOXAPARIN SODIUM 40 MG/0.4ML IJ SOSY: 40.0000 mg | PREFILLED_SYRINGE | INTRAMUSCULAR | Status: DC

## 2020-10-09 MED ORDER — LOSARTAN POTASSIUM 50 MG PO TABS
50.0000 mg | ORAL_TABLET | Freq: Every day | ORAL | Status: DC
  Administered 2020-10-09 – 2020-10-11 (×3): 50 mg via ORAL
  Filled 2020-10-09 (×3): qty 1

## 2020-10-09 MED ORDER — LABETALOL HCL 5 MG/ML IV SOLN: 10.0000 mg | INTRAVENOUS | Status: DC | PRN

## 2020-10-09 MED ORDER — TICAGRELOR 90 MG PO TABS
ORAL_TABLET | ORAL | Status: AC
  Filled 2020-10-09: qty 1

## 2020-10-09 MED ORDER — VERAPAMIL HCL 2.5 MG/ML IV SOLN
INTRAVENOUS | Status: DC | PRN
  Administered 2020-10-09: 10 mL via INTRA_ARTERIAL

## 2020-10-09 MED ORDER — HYDRALAZINE HCL 20 MG/ML IJ SOLN: 10.0000 mg | INTRAMUSCULAR | Status: AC | PRN

## 2020-10-09 MED ORDER — ATORVASTATIN CALCIUM 40 MG PO TABS
40.0000 mg | ORAL_TABLET | Freq: Every day | ORAL | Status: DC
  Administered 2020-10-09 – 2020-10-10 (×2): 40 mg via ORAL
  Filled 2020-10-09 (×2): qty 1

## 2020-10-09 MED ORDER — MIDAZOLAM HCL 2 MG/2ML IJ SOLN
INTRAMUSCULAR | Status: DC | PRN
  Administered 2020-10-09 (×3): 1 mg via INTRAVENOUS

## 2020-10-09 MED ORDER — IOHEXOL 350 MG/ML SOLN
100.0000 mL | Freq: Once | INTRAVENOUS | Status: AC | PRN
  Administered 2020-10-09: 100 mL via INTRAVENOUS

## 2020-10-09 MED ORDER — HYDRALAZINE HCL 20 MG/ML IJ SOLN: 5.0000 mg | INTRAMUSCULAR | Status: DC | PRN

## 2020-10-09 MED ORDER — SODIUM CHLORIDE 0.9 % IV SOLN: 250.0000 mL | INTRAVENOUS | Status: DC | PRN

## 2020-10-09 MED ORDER — GABAPENTIN 100 MG PO CAPS: 100.0000 mg | ORAL_CAPSULE | Freq: Three times a day (TID) | ORAL | Status: DC | PRN

## 2020-10-09 MED ORDER — NITROGLYCERIN IN D5W 200-5 MCG/ML-% IV SOLN
INTRAVENOUS | Status: AC
  Filled 2020-10-09: qty 250

## 2020-10-09 SURGICAL SUPPLY — 22 items
BALLN SAPPHIRE 2.5X12 (BALLOONS) ×2
BALLN SAPPHIRE ~~LOC~~ 3.5X15 (BALLOONS) ×1 IMPLANT
BALLOON SAPPHIRE 2.5X12 (BALLOONS) IMPLANT
CATH 5FR JL3.5 JR4 ANG PIG MP (CATHETERS) ×1 IMPLANT
CATH VISTA GUIDE 6FR JR4 (CATHETERS) ×1 IMPLANT
CATH VISTA GUIDE 6FR XBLAD3.5 (CATHETERS) ×1 IMPLANT
DEVICE RAD COMP TR BAND LRG (VASCULAR PRODUCTS) ×1 IMPLANT
GLIDESHEATH SLEND A-KIT 6F 22G (SHEATH) ×1 IMPLANT
GLIDESHEATH SLEND SS 6F .021 (SHEATH) ×1 IMPLANT
GUIDEWIRE INQWIRE 1.5J.035X260 (WIRE) IMPLANT
GUIDEWIRE PRESSURE X 175 (WIRE) ×1 IMPLANT
INQWIRE 1.5J .035X260CM (WIRE) ×2
KIT ENCORE 26 ADVANTAGE (KITS) ×1 IMPLANT
KIT ESSENTIALS PG (KITS) ×1 IMPLANT
KIT HEART LEFT (KITS) ×3 IMPLANT
PACK CARDIAC CATHETERIZATION (CUSTOM PROCEDURE TRAY) ×3 IMPLANT
SHEATH PROBE COVER 6X72 (BAG) ×1 IMPLANT
STENT ONYX FRONTIER 3.0X18 (Permanent Stent) ×1 IMPLANT
STENT ONYX FRONTIER 3.5X12 (Permanent Stent) ×1 IMPLANT
TRANSDUCER W/STOPCOCK (MISCELLANEOUS) ×3 IMPLANT
TUBING CIL FLEX 10 FLL-RA (TUBING) ×3 IMPLANT
WIRE ASAHI PROWATER 180CM (WIRE) ×1 IMPLANT

## 2020-10-09 NOTE — CV Procedure (Addendum)
50 to 60% [place] 111 across a large diagonal.  RFR 0.92. Left main widely patent Circumflex mid to distal eccentric 80% circumflex before smaller second and third obtuse marginal branches. Mid to distal RCA with 99% stenosis treated with overlapping 3.0 stents postdilated to 3.5 mm in diameter.  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  Postdilatation was with 3.5 mm [place] balloon. EF 30 to 35% with global hypokinesis.  LVEDP 22 mmHg. Consistent with acute on chronic SYSTOLIC HEART FAILURE. Etiology hypertension, substance abuse, and ischemia.

## 2020-10-09 NOTE — H&P (Signed)
History and Physical    Leslie Walker YTK:[id number] DOB: [birth year] DOA: 10/09/2020  PCP: Elbert Ewings, FNP Consultants:  Leonie Man - neurology Patient coming from:  Home - lives with daughter; [facility]: Daughter, [phone]  Chief Complaint: Chest pain  HPI: Leslie Walker is a 55 y.o. female with medical history significant of HTN and prior CVA presenting with CP.  She developed chest pain on Sunday.  She describes the pain as under her R breast/LUQ.  It would come and go.  Nothing made it better or worse.  She hasn't noticed her BP high at home.  She was smoking black and milds and used cocaine "maybe one day."    ED Course: Carryover, per Dr. Hal Hope:  55 year old female with history of hypertension and prior stroke presents with chest pain was found to be hypertensive with systolic in the 270W CT angiogram negative for dissection cardiology was consulted requested admission for trending cardiac markers and controlling blood pressure.  Review of Systems: As per HPI; otherwise review of systems reviewed and negative.   Ambulatory Status:  Ambulates without assistance  COVID Vaccine Status:  Complete without booster  Past Medical History:  Diagnosis Date   Cocaine abuse ([place])    Dyslipidemia    Hypertension    Noncompliance w/medication treatment due to intermit use of medication    Stroke ([place]) 2021   no deficits    Past Surgical History:  Procedure Laterality Date   LOOP RECORDER INSERTION N/A 06/27/2018   Procedure: LOOP RECORDER INSERTION;  Surgeon: Constance Haw, MD;  Location: [facility];  Service: Cardiovascular;  Laterality: N/A;    Social History   Socioeconomic History   Marital status: Single    Spouse name: Not on file   Number of children: 3   Years of education: Not on file   Highest education level: 12th grade  Occupational History   Not on file  Tobacco Use   Smoking status: Every Day    Types: Cigars   Smokeless tobacco: Never   Vaping Use   Vaping Use: Never used  Substance and Sexual Activity   Alcohol use: Yes    Comment: social drinking   Drug use: Yes    Types: Marijuana, Cocaine    Comment: 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   Sexual activity: Not Currently    Birth control/protection: Post-menopausal  Other Topics Concern   Not on file  Social History Narrative   Not on file   Social Determinants of Health   Financial Resource Strain: Not on file  Food Insecurity: Not on file  Transportation Needs: No Transportation Needs   Lack of Transportation (Medical): No   Lack of Transportation (Non-Medical): No  Physical Activity: Not on file  Stress: Not on file  Social Connections: Not on file  Intimate Partner Violence: Not on file    No Known Allergies  Family History  Problem Relation Age of Onset   Hypertension Mother    Hypertension Father    Hypertension Brother    Stroke Sister     Prior to Admission medications   Medication Sig Start Date End Date Taking? Authorizing Provider  acyclovir ointment (ZOVIRAX) 5 % Apply topically every 3 (three) hours. Patient not taking: Reported on 09/04/2018 06/30/18   Georgette Shell, MD  aspirin EC 81 MG EC tablet Take 1 tablet (81 mg total) by mouth daily. 06/30/18   Georgette Shell, MD  atorvastatin (LIPITOR) 40 MG tablet Take 1 tablet (40 mg  total) by mouth daily at 6 PM. 06/30/18   Georgette Shell, MD  hydrOXYzine (ATARAX/VISTARIL) 25 MG tablet Take 1 tablet (25 mg total) by mouth every 6 (six) hours as needed for anxiety. 06/30/18   Georgette Shell, MD  loperamide (IMODIUM) 2 MG capsule Take 1-2 capsules (2-4 mg total) by mouth as needed for diarrhea or loose stools (diarrhea). Patient not taking: Reported on 09/04/2018 06/30/18   Georgette Shell, MD  metoprolol tartrate (LOPRESSOR) 25 MG tablet Take 0.5 tablets (12.5 mg total) by mouth daily. 06/30/18   Georgette Shell, MD  metroNIDAZOLE (FLAGYL) 500 MG tablet Take 1 tablet  (500 mg total) by mouth 2 (two) times daily. 11/25/19   Constant, Peggy, MD  ondansetron (ZOFRAN-ODT) 4 MG disintegrating tablet Take 1 tablet (4 mg total) by mouth every 6 (six) hours as needed for nausea or vomiting. Patient not taking: Reported on 09/04/2018 06/30/18   Georgette Shell, MD    Physical Exam: Vitals:   10/09/20 0700 10/09/20 0730 10/09/20 1000 10/09/20 1230  BP: (!) 150/75 139/72 (!) 158/78 (!) 144/71  Pulse: (!) 49 (!) 56 65 (!) 56  Resp: 13 15 16 14   Temp:  98.1 F (36.7 C)    TempSrc:  Oral    SpO2: 97% 97% 100% 100%  Weight:      Height:         General:  Appears calm and comfortable and is in NAD; she became emotionally labile during our visit and reported "it sounds like you think I'm going to die!" Eyes:  PERRL, EOMI, normal lids, iris ENT:  grossly normal hearing, lips & tongue, mmm; edentulous Neck:  no LAD, masses or thyromegaly Cardiovascular:  RR with bradycardia, 4/6 systolic murmur, no r/g. No LE edema.  Respiratory:   CTA bilaterally with no wheezes/rales/rhonchi.  Normal respiratory effort. Abdomen:  soft, NT, ND Skin:  no rash or induration seen on limited exam Musculoskeletal:  grossly normal tone BUE/BLE, good ROM, no bony abnormality Psychiatric:  blunted mood and affect, speech fluent and appropriate, AOx3 Neurologic:  CN 2-12 grossly intact, moves all extremities in coordinated fashion    Radiological Exams on Admission: Independently reviewed - see discussion in A/P where applicable  DG Chest 2 View  Result Date: 10/09/2020 CLINICAL DATA:  Right-sided chest pain for 2 days EXAM: CHEST - 2 VIEW COMPARISON:  None. FINDINGS: Normal heart size and pulmonary vascularity. No focal airspace disease or consolidation in the lungs. No blunting of costophrenic angles. No pneumothorax. Mediastinal contours appear intact. Loop recorder. IMPRESSION: No active cardiopulmonary disease. Electronically Signed   By: Lucienne Capers M.D.   On: 10/09/2020  01:12   CT Angio Chest/Abd/Pel for Dissection W and/or Wo Contrast  Result Date: 10/09/2020 CLINICAL DATA:  Rule out aortic dissection. Abdominal pain. Intermittent right chest pain. EXAM: CT ANGIOGRAPHY CHEST, ABDOMEN AND PELVIS TECHNIQUE: Non-contrast CT of the chest was initially obtained. Multidetector CT imaging through the chest, abdomen and pelvis was performed using the standard protocol during bolus administration of intravenous contrast. Multiplanar reconstructed images and MIPs were obtained and reviewed to evaluate the vascular anatomy. CONTRAST:  130mL OMNIPAQUE IOHEXOL 350 MG/ML SOLN COMPARISON:  CT AP 06/29/2018. FINDINGS: CTA CHEST FINDINGS Cardiovascular: Preferential opacification of the thoracic aorta. No evidence of thoracic aortic aneurysm or dissection. Normal heart size. No pericardial effusion. Aortic atherosclerosis. Coronary artery calcifications. No signs of central obstructing pulmonary embolus. Mediastinum/Nodes: No enlarged mediastinal, hilar, or axillary lymph nodes. Thyroid gland, trachea,  and esophagus demonstrate no significant findings. Lungs/Pleura: No pleural effusion, airspace consolidation, atelectasis or pneumothorax. Subpleural nodule within the periphery of the right upper lobe measures 3 mm, image 34/8. Musculoskeletal: No chest wall abnormality. No acute or significant osseous findings. Review of the MIP images confirms the above findings. CTA ABDOMEN AND PELVIS FINDINGS VASCULAR Aorta: Normal caliber aorta without aneurysm, dissection, vasculitis or significant stenosis. Celiac: Patent without evidence of aneurysm, dissection, vasculitis or significant stenosis. SMA: Patent without evidence of aneurysm, dissection, vasculitis or significant stenosis. Renals: Normal appearance of the left renal artery. Calcified and noncalcified plaque at the origin of the right renal artery is identified with approximately 70% stenosis. IMA: Patent without evidence of aneurysm,  dissection, vasculitis or significant stenosis. Inflow: Patent without evidence of aneurysm, dissection, vasculitis or significant stenosis. Veins: No obvious venous abnormality within the limitations of this arterial phase study. Review of the MIP images confirms the above findings. NON-VASCULAR Hepatobiliary: No focal liver abnormality is seen. No gallstones, gallbladder wall thickening, or biliary dilatation. Pancreas: Unremarkable. No pancreatic ductal dilatation or surrounding inflammatory changes. Spleen: Normal in size without focal abnormality. Adrenals/Urinary Tract: Normal adrenal glands. Upper pole right kidney cyst measures 3.2 cm. No mass or hydronephrosis. Bladder unremarkable. Stomach/Bowel: Stomach is within normal limits. Appendix appears normal. No evidence of bowel wall thickening, distention, or inflammatory changes. Lymphatic: No enlarged lymph nodes Reproductive: Enlarged fibroid uterus. Other: No abdominal wall hernia or abnormality. No abdominopelvic ascites. Musculoskeletal: No acute or significant osseous findings. Review of the MIP images confirms the above findings. IMPRESSION: 1. No evidence for aortic dissection. 2. Coronary artery calcifications noted. 3. Calcified and noncalcified plaque at the origin of the right renal artery is identified with approximately 70% stenosis. 4. Subpleural nodule in the right upper lobe measures 3 mm. Nonspecific. No follow-up needed if patient is low-risk. Non-contrast chest CT can be considered in 12 months if patient is high-risk. This recommendation follows the consensus statement: Guidelines for Management of Incidental Pulmonary Nodules Detected on CT Images: From the Fleischner Society 2017; Radiology 2017; 284:228-243. 5. Enlarged fibroid uterus. 6. Aortic Atherosclerosis (ICD10-I70.0). Electronically Signed   By: Kerby Moors M.D.   On: 10/09/2020 05:40    EKG: Independently reviewed.  NSR with rate 65; nonspecific ST changes with no  evidence of acute ischemia   Labs on Admission: I have personally reviewed the available labs and imaging studies at the time of the admission.  Pertinent labs:   BUN 11/Creatinine 1.19/GFR 54 BNP 304.5 HS troponin 77, 100 Unremarkable CBC UDS: +opiates, cocaine, THC COVID/flu negative   Assessment/Plan Principal Problem:   Chest pain, precordial Active Problems:   Uncontrolled hypertension   Polysubstance abuse (HCC)   Dyslipidemia   Lung nodule   Tobacco dependence   Chest pain -Patient with atypical right-sided chest pain that has come on intermittently since Sunday, appears to resolve spontaneously. -Symptoms are suggestive of noncardiac chest pain, but she has markedly uncontrolled HTN and CTA shows significant ASCVD.  -CXR unremarkable.   -Initial cardiac HS troponin elevated with repeat moreso.  -EKG not indicative of acute ischemia.   -Will plan to place in observation status on telemetry to rule out ACS by overnight observation.  -Repeat HS troponin -Risk factor stratification with HgbA1c and FLP -Cardiology consultation overnight and this AM; she is currently planned to have cardiac cath and so is NPO -Echo pending -Continue ASA  Uncontrolled HTN -Patient acknowledges poor compliance at home -As such, Clonidine is suboptimal for her -She  also uses cocaine so will need to be cautious with BB therapy -For now will continue Lopressor -Will also add prn hydralazine  Polysubstance abuse -Cessation encouraged; this should be encouraged on an ongoing basis -UDS ordered  and positive for opiates, cocaine, THC -IVDA is a consideration  HLD -Continue Lipitor  Lung nodule -Needs repeat imaging in 12 months since patient is at high risk  Tobacco dependence -Encourage cessation.   -This was discussed with the patient and should be reviewed on an ongoing basis.   -Patch ordered     Note: This patient has been tested and is negative for the novel coronavirus  COVID-19. She has been fully vaccinated against COVID-19.    DVT prophylaxis: Lovenox  Code Status:  Full - confirmed with patient Family Communication: None present Disposition Plan:  The patient is from: home  Anticipated d/c is to: home without [facility] services   Anticipated d/c date will depend on clinical response to treatment, but possibly as early as tomorrow if she has excellent response to treatment  Patient is currently: acutely ill Consults called: Cardiology  Admission status: It is my clinical opinion that referral for OBSERVATION is reasonable and necessary in this patient based on the above information provided. The aforementioned taken together are felt to place the patient at high risk for further clinical deterioration. However it is anticipated that the patient may be medically stable for discharge from the hospital within 24 to 48 hours.      Karmen Bongo MD [facility]   How to contact the [facility] Attending or Consulting provider [place] or covering provider during after hours [place], for this patient?  Check the care team in [facility] and look for a) attending/consulting [facility] provider listed and b) the [facility] team listed Log into [email or web address] and use [place]'s universal password to access. If you do not have the password, please contact the hospital operator. Locate the [facility] provider you are looking for under [facility] and page to a number that you can be directly reached. If you still have difficulty reaching the provider, please page the [facility] (Director on Call) for the Hospitalists listed on amion for assistance.   10/09/2020, 1:35 PM

## 2020-10-09 NOTE — ED Provider Notes (Signed)
[facility] EMERGENCY DEPARTMENT Provider Note   CSN: [id number] Arrival date & time: 10/08/20  2343     History Chief Complaint  Patient presents with   Chest Pain    Leslie Walker is a 55 y.o. female.  The history is provided by the patient.  Chest Pain Pain location:  Substernal area Pain quality: dull   Pain radiates to:  Does not radiate Pain severity:  Moderate Onset quality:  Gradual Duration:  3 days Timing:  Intermittent Progression:  Worsening Chronicity:  New Context: at rest   Context comment:  Exertion Relieved by:  Nothing Worsened by:  Nothing Ineffective treatments:  None tried Associated symptoms: diaphoresis and shortness of breath   Associated symptoms: no abdominal pain, no back pain, no fever, no palpitations and no vomiting   Risk factors: hypertension       Past Medical History:  Diagnosis Date   Hypertension    Stroke [facility])     Patient Active Problem List   Diagnosis Date Noted   Screening breast examination 09/04/2018   Heroin dependence ([place]) 06/29/2018   TIA (transient ischemic attack) 06/24/2018   Nail fungus 04/20/2015   Porokeratosis 04/20/2015   History of ulceration 04/20/2015   Foot ulcer ([place]) 02/17/2014   Cellulitis of foot, right 02/17/2014   Chronic pain syndrome 02/17/2014   Essential hypertension 02/17/2014    Past Surgical History:  Procedure Laterality Date   LOOP RECORDER INSERTION N/A 06/27/2018   Procedure: LOOP RECORDER INSERTION;  Surgeon: Constance Haw, MD;  Location: [facility];  Service: Cardiovascular;  Laterality: N/A;     OB History     Gravida  3   Para  2   Term  2   Preterm      AB  1   Living  3      SAB      IAB  1   Ectopic      Multiple      Live Births              Family History  Problem Relation Age of Onset   Hypertension Mother    Hypertension Father    Hypertension Brother    Stroke Sister     Social History    Tobacco Use   Smoking status: Every Day    Types: Cigars   Smokeless tobacco: Never  Vaping Use   Vaping Use: Never used  Substance Use Topics   Alcohol use: No   Drug use: No    Comment: 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    Home Medications Prior to Admission medications   Medication Sig Start Date End Date Taking? Authorizing Provider  acyclovir ointment (ZOVIRAX) 5 % Apply topically every 3 (three) hours. Patient not taking: Reported on 09/04/2018 06/30/18   Georgette Shell, MD  aspirin EC 81 MG EC tablet Take 1 tablet (81 mg total) by mouth daily. 06/30/18   Georgette Shell, MD  atorvastatin (LIPITOR) 40 MG tablet Take 1 tablet (40 mg total) by mouth daily at 6 PM. 06/30/18   Georgette Shell, MD  hydrOXYzine (ATARAX/VISTARIL) 25 MG tablet Take 1 tablet (25 mg total) by mouth every 6 (six) hours as needed for anxiety. 06/30/18   Georgette Shell, MD  loperamide (IMODIUM) 2 MG capsule Take 1-2 capsules (2-4 mg total) by mouth as needed for diarrhea or loose stools (diarrhea). Patient not taking: Reported on 09/04/2018 06/30/18   Landis Gandy  G, MD  metoprolol tartrate (LOPRESSOR) 25 MG tablet Take 0.5 tablets (12.5 mg total) by mouth daily. 06/30/18   Georgette Shell, MD  metroNIDAZOLE (FLAGYL) 500 MG tablet Take 1 tablet (500 mg total) by mouth 2 (two) times daily. 11/25/19   Constant, Peggy, MD  ondansetron (ZOFRAN-ODT) 4 MG disintegrating tablet Take 1 tablet (4 mg total) by mouth every 6 (six) hours as needed for nausea or vomiting. Patient not taking: Reported on 09/04/2018 06/30/18   Georgette Shell, MD    Allergies    Patient has no known allergies.  Review of Systems   Review of Systems  Constitutional:  Positive for diaphoresis. Negative for fever.  HENT:  Negative for drooling and facial swelling.   Eyes:  Negative for redness.  Respiratory:  Positive for shortness of breath.   Cardiovascular:  Positive for chest pain. Negative for  palpitations.  Gastrointestinal:  Negative for abdominal pain and vomiting.  Genitourinary:  Negative for difficulty urinating.  Musculoskeletal:  Negative for back pain.  Skin:  Negative for rash.  Neurological:  Negative for facial asymmetry.  Psychiatric/Behavioral:  Negative for agitation.   All other systems reviewed and are negative.  Physical Exam Updated Vital Signs BP (!) 203/89   Pulse 72   Temp 97.8 F (36.6 C) (Oral)   Resp 18   Ht 5\' 4"  (1.626 m)   Wt 76 kg   LMP  (LMP Unknown) Comment: Pt unsure why she no longer gets her period  SpO2 100%   BMI 28.76 kg/m   Physical Exam Vitals and nursing note reviewed.  Constitutional:      General: She is not in acute distress.    Appearance: Normal appearance.  HENT:     Head: Normocephalic and atraumatic.     Nose: Nose normal.  Eyes:     Conjunctiva/sclera: Conjunctivae normal.     Pupils: Pupils are equal, round, and reactive to light.  Cardiovascular:     Rate and Rhythm: Normal rate and regular rhythm.     Pulses: Normal pulses.     Heart sounds: Normal heart sounds.  Pulmonary:     Effort: Pulmonary effort is normal.     Breath sounds: Normal breath sounds.  Abdominal:     General: Abdomen is flat. Bowel sounds are normal.     Palpations: Abdomen is soft.     Tenderness: There is no abdominal tenderness. There is no guarding.  Musculoskeletal:        General: Normal range of motion.     Cervical back: Normal range of motion and neck supple.  Skin:    General: Skin is warm.     Capillary Refill: Capillary refill takes less than 2 seconds.  Neurological:     General: No focal deficit present.     Mental Status: She is alert and oriented to person, place, and time.     Deep Tendon Reflexes: Reflexes normal.  Psychiatric:        Mood and Affect: Mood normal.        Behavior: Behavior normal.    ED Results / Procedures / Treatments   Labs (all labs ordered are listed, but only abnormal results are  displayed) Labs Reviewed  CBC WITH DIFFERENTIAL/PLATELET - Abnormal; Notable for the following components:      Result Value   MCHC 36.5 (*)    Neutro Abs 1.6 (*)    Lymphs Abs 4.4 (*)    All other components within normal limits  COMPREHENSIVE METABOLIC PANEL - Abnormal; Notable for the following components:   Glucose, Bld 117 (*)    Creatinine, Ser 1.19 (*)    Total Protein 6.4 (*)    Albumin 3.3 (*)    Total Bilirubin 0.2 (*)    GFR, Estimated 54 (*)    All other components within normal limits  TROPONIN I (HIGH SENSITIVITY) - Abnormal; Notable for the following components:   Troponin I (High Sensitivity) 77 (*)    All other components within normal limits  RESP PANEL BY RT-PCR (FLU A&B, COVID) ARPGX2  LIPASE, BLOOD  RAPID URINE DRUG SCREEN, HOSP PERFORMED  TROPONIN I (HIGH SENSITIVITY)    EKG EKG Interpretation  Date/Time:  Friday October 09 2020 00:15:40 EDT Ventricular Rate:  65 PR Interval:  170 QRS Duration: 84 QT Interval:  472 QTC Calculation: 490 R Axis:   78 Text Interpretation: Normal sinus rhythm Right atrial enlargement Minimal voltage criteria for LVH, may be normal variant ( Sokolow-Lyon ) Cannot rule out Anterior infarct , age undetermined Confirmed by Randal Buba, Chante Mayson ([id number]) on 10/09/2020 1:47:51 AM  Radiology DG Chest 2 View  Result Date: 10/09/2020 CLINICAL DATA:  Right-sided chest pain for 2 days EXAM: CHEST - 2 VIEW COMPARISON:  None. FINDINGS: Normal heart size and pulmonary vascularity. No focal airspace disease or consolidation in the lungs. No blunting of costophrenic angles. No pneumothorax. Mediastinal contours appear intact. Loop recorder. IMPRESSION: No active cardiopulmonary disease. Electronically Signed   By: Lucienne Capers M.D.   On: 10/09/2020 01:12    Procedures Procedures   Medications Ordered in ED Medications  nitroGLYCERIN (NITROGLYN) 2 % ointment 1 inch (1 inch Topical Given 10/09/20 0317)  acetaminophen (TYLENOL) tablet 1,000  mg (1,000 mg Oral Given 10/09/20 7588)    ED Course  I have reviewed the triage vital signs and the nursing notes.  Pertinent labs & imaging results that were available during my care of the patient were reviewed by me and considered in my medical decision making (see chart for details).   Case d/w Dr. Kalman Shan of cardiology who will see the patient please get CTA  Leslie Walker was evaluated in Emergency Department on 10/09/2020 for the symptoms described in the history of present illness. She was evaluated in the context of the global COVID-19 pandemic, which necessitated consideration that the patient might be at risk for infection with the SARS-CoV-2 virus that causes COVID-19. Institutional protocols and algorithms that pertain to the evaluation of patients at risk for COVID-19 are in a state of rapid change based on information released by regulatory bodies including the CDC and federal and state organizations. These policies and algorithms were followed during the patient's care in the ED.  Final Clinical Impression(s) / ED Diagnoses Final diagnoses:  Unstable angina [facility])   Admit for ongoing testing  Rx / DC Orders ED Discharge Orders     None        Little Winton, MD 10/09/20 3254

## 2020-10-09 NOTE — Progress Notes (Signed)
DAILY PROGRESS NOTE   Patient Name: Leslie Walker Date of Encounter: 10/09/2020 Cardiologist: None  Chief Complaint   No chest pain  Patient Profile   Alyzah Pelly is a 55 y.o. female with a history of hypertension, hyperlipidemia, cryptogenic stroke s/p ILR, smoking, and remote heroin abuse. She is being seen today (10/09/2020) for the evaluation of chest pain  Subjective   Ms. Wortley reports no further chest pain. Blood pressure appears somewhat better controlled today. Troponins overnight were mildly elevated at 77 and then 100. She described to me typical anginal symptoms, which she thinks were precipitated when trying to carry a lawnmower out of her truck.  Her chest pressure has been going on for about a week.  Symptoms seem to be both with exertion and sometimes at rest.  She ultimately came to the emergency department as her symptoms were not subsiding. Overnight, Dr. Kalman Shan was concerned about possible dissection.  CT angiogram was negative for this however did show multivessel coronary calcium and atherosclerosis. Additionally there was reported to be calcified and noncalcified plaque of the right renal artery with approximately 70% stenosis at the origin.  She also had reported that she may have used cocaine 1 day.  Her UDS was positive for opiates, cocaine and THC.  BNP is elevated at 304.  Objective   Vitals:   10/09/20 0600 10/09/20 0700 10/09/20 0730 10/09/20 1000  BP: (!) 157/73 (!) 150/75 139/72 (!) 158/78  Pulse: 60 (!) 49 (!) 56 65  Resp: 13 13 15 16   Temp:   98.1 F (36.7 C)   TempSrc:   Oral   SpO2: 100% 97% 97% 100%  Weight:      Height:       No intake or output data in the 24 hours ending 10/09/20 1222 Filed Weights   10/09/20 0014  Weight: 76 kg    Physical Exam   General appearance: alert and no distress Neck: no carotid bruit, no JVD, and thyroid not enlarged, symmetric, no tenderness/mass/nodules Lungs: clear to auscultation bilaterally Heart:  regular rate and rhythm Abdomen: soft, non-tender; bowel sounds normal; no masses,  no organomegaly Extremities: extremities normal, atraumatic, no cyanosis or edema Pulses: 2+ and symmetric Skin: Skin color, texture, turgor normal. No rashes or lesions Neurologic: Grossly normal Psych: Pleasant  Inpatient Medications    Scheduled Meds:  aspirin EC  81 mg Oral Daily   atorvastatin  40 mg Oral q1800   enoxaparin (LOVENOX) injection  40 mg Subcutaneous Q24H   metoprolol tartrate  12.5 mg Oral Daily   sodium chloride flush  3 mL Intravenous Q12H    Continuous Infusions:   PRN Meds: acetaminophen, labetalol, ondansetron (ZOFRAN) IV   Labs   Results for orders placed or performed during the hospital encounter of 10/09/20 (from the past 48 hour(s))  CBC with Differential/Platelet     Status: Abnormal   Collection Time: 10/09/20 12:37 AM  Result Value Ref Range   WBC 6.7 4.0 - 10.5 K/uL   RBC 4.63 3.87 - 5.11 MIL/uL   Hemoglobin 13.9 12.0 - 15.0 g/dL   HCT 38.1 36.0 - 46.0 %   MCV 82.3 80.0 - 100.0 fL   MCH 30.0 26.0 - 34.0 pg   MCHC 36.5 (H) 30.0 - 36.0 g/dL   RDW 13.6 11.5 - 15.5 %   Platelets 212 150 - 400 K/uL   nRBC 0.0 0.0 - 0.2 %   Neutrophils Relative % 23 %   Neutro Abs 1.6 (L)  1.7 - 7.7 K/uL   Lymphocytes Relative 65 %   Lymphs Abs 4.4 (H) 0.7 - 4.0 K/uL   Monocytes Relative 7 %   Monocytes Absolute 0.5 0.1 - 1.0 K/uL   Eosinophils Relative 4 %   Eosinophils Absolute 0.3 0.0 - 0.5 K/uL   Basophils Relative 1 %   Basophils Absolute 0.1 0.0 - 0.1 K/uL   Immature Granulocytes 0 %   Abs Immature Granulocytes 0.01 0.00 - 0.07 K/uL    Comment: Performed at [facility] [address], [place], [place] [address]  Comprehensive metabolic panel     Status: Abnormal   Collection Time: 10/09/20 12:37 AM  Result Value Ref Range   Sodium 141 135 - 145 mmol/L   Potassium 4.8 3.5 - 5.1 mmol/L   Chloride 104 98 - 111 mmol/L   CO2 29 22 - 32 mmol/L   Glucose, Bld  117 (H) 70 - 99 mg/dL    Comment: Glucose reference range applies only to samples taken after fasting for at least 8 hours.   BUN 11 6 - 20 mg/dL   Creatinine, Ser 1.19 (H) 0.44 - 1.00 mg/dL   Calcium 9.0 8.9 - 10.3 mg/dL   Total Protein 6.4 (L) 6.5 - 8.1 g/dL   Albumin 3.3 (L) 3.5 - 5.0 g/dL   AST 25 15 - 41 U/L   ALT 13 0 - 44 U/L   Alkaline Phosphatase 72 38 - 126 U/L   Total Bilirubin 0.2 (L) 0.3 - 1.2 mg/dL   GFR, Estimated 54 (L) >60 mL/min    Comment: (NOTE) Calculated using the CKD-EPI Creatinine Equation (2021)    Anion gap 8 5 - 15    Comment: Performed at [facility], [place] [address] [place], [place] [address]  Troponin I (High Sensitivity)     Status: Abnormal   Collection Time: 10/09/20 12:37 AM  Result Value Ref Range   Troponin I (High Sensitivity) 77 (H) <18 ng/L    Comment: (NOTE) Elevated high sensitivity troponin I (hsTnI) values and significant  changes across serial measurements may suggest ACS but many other  chronic and acute conditions are known to elevate hsTnI results.  Refer to the "Links" section for chest pain algorithms and additional  guidance. Performed at [facility], [place] [address] [place], [place] [address]   Lipase, blood     Status: None   Collection Time: 10/09/20 12:37 AM  Result Value Ref Range   Lipase 18 11 - 51 U/L    Comment: Performed at [facility] [address], [place], [place] [address]  Brain natriuretic peptide     Status: Abnormal   Collection Time: 10/09/20 12:37 AM  Result Value Ref Range   B Natriuretic Peptide 304.5 (H) 0.0 - 100.0 pg/mL    Comment: Performed at [facility] [address] [place],  [address]  Resp Panel by RT-PCR (Flu A&B, Covid) Nasopharyngeal Swab     Status: None   Collection Time: 10/09/20  1:54 AM   Specimen: Nasopharyngeal Swab; Nasopharyngeal(NP) swabs in vial transport medium  Result Value Ref Range   SARS Coronavirus 2 by RT PCR NEGATIVE NEGATIVE     Comment: (NOTE) SARS-CoV-2 target nucleic acids are NOT DETECTED.  The SARS-CoV-2 RNA is generally detectable in upper respiratory specimens during the acute phase of infection. The lowest concentration of SARS-CoV-2 viral copies this assay can detect is 138 copies/mL. A negative result does not  preclude SARS-Cov-2 infection and should not be used as the sole basis for treatment or other patient management decisions. A negative result may occur with  improper specimen collection/handling, submission of specimen other than nasopharyngeal swab, presence of viral mutation(s) within the areas targeted by this assay, and inadequate number of viral copies(<138 copies/mL). A negative result must be combined with clinical observations, patient history, and epidemiological information. The expected result is Negative.  Fact Sheet for Patients:  EntrepreneurPulse.com.au  Fact Sheet for Healthcare Providers:  IncredibleEmployment.be  This test is no t yet approved or cleared by the [place] FDA and  has been authorized for detection and/or diagnosis of SARS-CoV-2 by FDA under an Emergency Use Authorization (EUA). This EUA will remain  in effect (meaning this test can be used) for the duration of the COVID-19 declaration under Section 564(b)(1) of the Act, 21 U.S.C.section 360bbb-3(b)(1), unless the authorization is terminated  or revoked sooner.       Influenza A by PCR NEGATIVE NEGATIVE   Influenza B by PCR NEGATIVE NEGATIVE    Comment: (NOTE) The Xpert Xpress SARS-CoV-2/FLU/RSV plus assay is intended as an aid in the diagnosis of influenza from Nasopharyngeal swab specimens and should not be used as a sole basis for treatment. Nasal washings and aspirates are unacceptable for Xpert Xpress SARS-CoV-2/FLU/RSV testing.  Fact Sheet for Patients: EntrepreneurPulse.com.au  Fact Sheet for Healthcare  Providers: IncredibleEmployment.be  This test is not yet approved or cleared by the [place] FDA and has been authorized for detection and/or diagnosis of SARS-CoV-2 by FDA under an Emergency Use Authorization (EUA). This EUA will remain in effect (meaning this test can be used) for the duration of the COVID-19 declaration under Section 564(b)(1) of the Act, 21 U.S.C. section 360bbb-3(b)(1), unless the authorization is terminated or revoked.  Performed at [facility], [place] [address], [place], [place] [address]   Rapid urine drug screen (hospital performed)     Status: Abnormal   Collection Time: 10/09/20  2:19 AM  Result Value Ref Range   Opiates POSITIVE (A) NONE DETECTED   Cocaine POSITIVE (A) NONE DETECTED   Benzodiazepines NONE DETECTED NONE DETECTED   Amphetamines NONE DETECTED NONE DETECTED   Tetrahydrocannabinol POSITIVE (A) NONE DETECTED   Barbiturates NONE DETECTED NONE DETECTED    Comment: (NOTE) DRUG SCREEN FOR MEDICAL PURPOSES ONLY.  IF CONFIRMATION IS NEEDED FOR ANY PURPOSE, NOTIFY LAB WITHIN 5 DAYS.  LOWEST DETECTABLE LIMITS FOR URINE DRUG SCREEN Drug Class                     Cutoff (ng/mL) Amphetamine and metabolites    1000 Barbiturate and metabolites    200 Benzodiazepine                 149 Tricyclics and metabolites     300 Opiates and metabolites        300 Cocaine and metabolites        300 THC                            50 Performed at [facility], [place] [address] [place], [place] [address]   Troponin I (High Sensitivity)     Status: Abnormal   Collection Time: 10/09/20  3:31 AM  Result Value Ref Range   Troponin I (High Sensitivity) 100 (HH) <18 ng/L    Comment: CRITICAL RESULT CALLED TO, READ BACK BY AND VERIFIED  WITH: TATE G,RN 10/09/20 0543 WAYK Performed at [facility], [place] [address] [place], [place] [address]     ECG   Normal sinus rhythm, right atrial enlargement, poor R wave progression  anteriorly- Personally Reviewed  Telemetry   Sinus rhythm- Personally Reviewed  Radiology    DG Chest 2 View  Result Date: 10/09/2020 CLINICAL DATA:  Right-sided chest pain for 2 days EXAM: CHEST - 2 VIEW COMPARISON:  None. FINDINGS: Normal heart size and pulmonary vascularity. No focal airspace disease or consolidation in the lungs. No blunting of costophrenic angles. No pneumothorax. Mediastinal contours appear intact. Loop recorder. IMPRESSION: No active cardiopulmonary disease. Electronically Signed   By: Lucienne Capers M.D.   On: 10/09/2020 01:12   CT Angio Chest/Abd/Pel for Dissection W and/or Wo Contrast  Result Date: 10/09/2020 CLINICAL DATA:  Rule out aortic dissection. Abdominal pain. Intermittent right chest pain. EXAM: CT ANGIOGRAPHY CHEST, ABDOMEN AND PELVIS TECHNIQUE: Non-contrast CT of the chest was initially obtained. Multidetector CT imaging through the chest, abdomen and pelvis was performed using the standard protocol during bolus administration of intravenous contrast. Multiplanar reconstructed images and MIPs were obtained and reviewed to evaluate the vascular anatomy. CONTRAST:  183mL OMNIPAQUE IOHEXOL 350 MG/ML SOLN COMPARISON:  CT AP 06/29/2018. FINDINGS: CTA CHEST FINDINGS Cardiovascular: Preferential opacification of the thoracic aorta. No evidence of thoracic aortic aneurysm or dissection. Normal heart size. No pericardial effusion. Aortic atherosclerosis. Coronary artery calcifications. No signs of central obstructing pulmonary embolus. Mediastinum/Nodes: No enlarged mediastinal, hilar, or axillary lymph nodes. Thyroid gland, trachea, and esophagus demonstrate no significant findings. Lungs/Pleura: No pleural effusion, airspace consolidation, atelectasis or pneumothorax. Subpleural nodule within the periphery of the right upper lobe measures 3 mm, image 34/8. Musculoskeletal: No chest wall abnormality. No acute or significant osseous findings. Review of the MIP images  confirms the above findings. CTA ABDOMEN AND PELVIS FINDINGS VASCULAR Aorta: Normal caliber aorta without aneurysm, dissection, vasculitis or significant stenosis. Celiac: Patent without evidence of aneurysm, dissection, vasculitis or significant stenosis. SMA: Patent without evidence of aneurysm, dissection, vasculitis or significant stenosis. Renals: Normal appearance of the left renal artery. Calcified and noncalcified plaque at the origin of the right renal artery is identified with approximately 70% stenosis. IMA: Patent without evidence of aneurysm, dissection, vasculitis or significant stenosis. Inflow: Patent without evidence of aneurysm, dissection, vasculitis or significant stenosis. Veins: No obvious venous abnormality within the limitations of this arterial phase study. Review of the MIP images confirms the above findings. NON-VASCULAR Hepatobiliary: No focal liver abnormality is seen. No gallstones, gallbladder wall thickening, or biliary dilatation. Pancreas: Unremarkable. No pancreatic ductal dilatation or surrounding inflammatory changes. Spleen: Normal in size without focal abnormality. Adrenals/Urinary Tract: Normal adrenal glands. Upper pole right kidney cyst measures 3.2 cm. No mass or hydronephrosis. Bladder unremarkable. Stomach/Bowel: Stomach is within normal limits. Appendix appears normal. No evidence of bowel wall thickening, distention, or inflammatory changes. Lymphatic: No enlarged lymph nodes Reproductive: Enlarged fibroid uterus. Other: No abdominal wall hernia or abnormality. No abdominopelvic ascites. Musculoskeletal: No acute or significant osseous findings. Review of the MIP images confirms the above findings. IMPRESSION: 1. No evidence for aortic dissection. 2. Coronary artery calcifications noted. 3. Calcified and noncalcified plaque at the origin of the right renal artery is identified with approximately 70% stenosis. 4. Subpleural nodule in the right upper lobe measures 3 mm.  Nonspecific. No follow-up needed if patient is low-risk. Non-contrast chest CT can be considered in 12 months if patient is high-risk. This recommendation follows  the consensus statement: Guidelines for Management of Incidental Pulmonary Nodules Detected on CT Images: From the Fleischner Society 2017; Radiology 2017; 284:228-243. 5. Enlarged fibroid uterus. 6. Aortic Atherosclerosis (ICD10-I70.0). Electronically Signed   By: Kerby Moors M.D.   On: 10/09/2020 05:40    Cardiac Studies   Echo pending  Assessment   Principal Problem:   NSTEMI (non-ST elevated myocardial infarction) [facility]) Active Problems:   Uncontrolled hypertension   Chest pain, precordial   Cocaine use   Plan   Ms. Gherardi had small troponin elevations in the setting of acute chest pain with ongoing symptoms that have been worsening over the past week.  She also has had some uncontrolled hypertension which is improved.  She reports recent cocaine use and her UDS was positive.  She also has some history of heroin dependence in her chart.  CT angiogram of the chest was negative for dissection or PE however multivessel coronary calcification and aortic atherosclerosis was noted as well as approximately 70% ostial right renal artery stenosis with mixed plaque (? contributing to uncontrolled hypertension).  I suspect this is cocaine related chest pain with abnormal troponins which could indicate spasm, plaque rupture or be related to demand ischemia in the setting of underlying coronary disease.  Based on this would recommend direct coronary catheterization.  Discussed the risks, benefits and alternatives with her today as well as the procedure in depth and she is willing to proceed.  Please keep her n.p.o..  Echocardiogram is pending.  BNP was mildly elevated however she does not appear overtly volume overloaded on exam.  Continue current medical therapy with aspirin, high potency statin and beta-blocker.  Time Spent Directly with  Patient:  I have spent a total of 25 minutes with the patient reviewing hospital notes, telemetry, EKGs, labs and examining the patient as well as establishing an assessment and plan that was discussed personally with the patient.  > 50% of time was spent in direct patient care.  Length of Stay:  LOS: 0 days   Pixie Casino, MD, [facility], [place] Director of the Advanced Lipid Disorders &  Cardiovascular Risk Reduction Clinic Diplomate of the American Board of Clinical Lipidology Attending Cardiologist  Direct Dial: [phone]  Fax: [phone]  Website:  [email or web address] 10/09/2020, 12:22 PM

## 2020-10-09 NOTE — Interval H&P Note (Signed)
Cath Lab Visit (complete for each Cath Lab visit)  Clinical Evaluation Leading to the Procedure:   ACS: Yes.    Non-ACS:    Anginal Classification: CCS III  Anti-ischemic medical therapy: Minimal Therapy (1 class of medications)  Non-Invasive Test Results: No non-invasive testing performed  Prior CABG: No previous CABG      History and Physical Interval Note:  10/09/2020 2:37 PM  Leslie Walker  has presented today for surgery, with the diagnosis of nstemi.  The various methods of treatment have been discussed with the patient and family. After consideration of risks, benefits and other options for treatment, the patient has consented to  Procedure(s): LEFT HEART CATH AND CORONARY ANGIOGRAPHY (N/A) as a surgical intervention.  The patient's history has been reviewed, patient examined, no change in status, stable for surgery.  I have reviewed the patient's chart and labs.  Questions were answered to the patient's satisfaction.     Belva Crome III

## 2020-10-09 NOTE — ED Triage Notes (Signed)
Patient reports intermittent right chest pain for 3 days , no SOB , denies emesis or diaphoresis , no cough or fever .

## 2020-10-09 NOTE — Consult Note (Signed)
Cardiology Consult    Patient ID: Leslie Walker MRN: [id number], DOB/AGE: [birth year]   Admit date: 10/09/2020 Date of Consult: 10/09/2020 Requesting Provider: Veatrice Kells, MD  PCP:  Elbert Ewings, FNP Cardiologist:  None   Patient Profile    Leslie Walker is a 55 y.o. female with a history of hypertension, hyperlipidemia, cryptogenic stroke s/p ILR, smoking, and remote heroin abuse. She is being seen today (10/09/2020) for the evaluation of chest pain   History of Present Illness    Reports first noticing chest pain about a week ago. Feels like a sustained pressure under her right breast that lasts an hour or so then eases off. Since it started she has experienced this discomfort both at rest and with activity. It seems fairly unpredictable. No particularly pleuritic or positional. Since it began there has been little progression but she says she eventually came to the ED because it wasn't going away. She is prescribed multiple medications but has not taken any of them for "awhile".  Specifically denies any associated dyspnea without prompting. Also denies any nausea. No orthopnea, PND, or swelling.   Persistent severe hypertension since arrival with BP up to 200/95. ECG consistent with LVH with repolarization abnormality. Cr slightly elevated from prior at 1.19. Troponin 77. Mediastinum appears widened on CXR and ascending aorta dilated on the lateral film. Received aspirin pta and in the ED has been given nitro paste. There is a UDS ordered, but not collected.  Past Medical History   Past Medical History:  Diagnosis Date   Hypertension    Stroke [facility])     Past Surgical History:  Procedure Laterality Date   LOOP RECORDER INSERTION N/A 06/27/2018   Procedure: LOOP RECORDER INSERTION;  Surgeon: Constance Haw, MD;  Location: [place] [facility];  Service: Cardiovascular;  Laterality: N/A;     No Known Allergies Inpatient Medications     acetaminophen  1,000 mg Oral Once    nitroGLYCERIN  1 inch Topical Once    Family History    Family History  Problem Relation Age of Onset   Hypertension Mother    Hypertension Father    Hypertension Brother    Stroke Sister    She indicated that her mother is deceased. She indicated that her father is deceased. She indicated that her sister is alive. She indicated that her brother is deceased.   Social History    Social History   Socioeconomic History   Marital status: Single    Spouse name: Not on file   Number of children: 3   Years of education: Not on file   Highest education level: 12th grade  Occupational History   Not on file  Tobacco Use   Smoking status: Every Day    Types: Cigars   Smokeless tobacco: Never  Vaping Use   Vaping Use: Never used  Substance and Sexual Activity   Alcohol use: No   Drug use: No    Comment: 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   Sexual activity: Not Currently    Birth control/protection: Post-menopausal  Other Topics Concern   Not on file  Social History Narrative   Not on file   Social Determinants of Health   Financial Resource Strain: Not on file  Food Insecurity: Not on file  Transportation Needs: No Transportation Needs   Lack of Transportation (Medical): No   Lack of Transportation (Non-Medical): No  Physical Activity: Not on file  Stress: Not on file  Social Connections: Not  on file  Intimate Partner Violence: Not on file     Review of Systems    A comprehensive review of systems was performed with pertinent positives and negatives noted in the HPI.  Physical Exam    Blood pressure (!) 200/95, pulse 71, temperature 97.8 F (36.6 C), temperature source Oral, resp. rate 17, height 5\' 4"  (1.626 m), weight 76 kg, SpO2 100 %.    No intake or output data in the 24 hours ending 10/09/20 0317 Wt Readings from Last 3 Encounters:  10/09/20 76 kg  11/21/19 78.6 kg  09/04/18 73.5 kg    CONSTITUTIONAL: alert and conversant, well-appearing,  nourished, in no distress HEENT: normal NECK: no JVD, no masses CARDIAC: Regular rhythm. Mid-peaking SEM. Normal S1/S2, no S3/S4. No friction rub. VASCULAR: Radial pulses intact bilaterally. No carotid bruits. PULMONARY/CHEST WALL: no deformities, normal breath sounds bilaterally, normal work of breathing ABDOMINAL: soft, non-tender, non-distended EXTREMITIES: no edema, no muscle atrophy, warm and well-perfused SKIN: Dry and intact without apparent rashes or wounds. No peripheral cyanosis. NEUROLOGIC: alert, no abnormal movements, cranial nerves grossly intact. PSYCH: normal affect, normal speech and language   Labs    Recent Labs    10/09/20 0037  TROPONINIHS 77*   Lab Results  Component Value Date   WBC 6.7 10/09/2020   HGB 13.9 10/09/2020   HCT 38.1 10/09/2020   MCV 82.3 10/09/2020   PLT 212 10/09/2020    Recent Labs  Lab 10/09/20 0037  NA 141  K 4.8  CL 104  CO2 29  BUN 11  CREATININE 1.19*  CALCIUM 9.0  PROT 6.4*  BILITOT 0.2*  ALKPHOS 72  ALT 13  AST 25  GLUCOSE 117*   Lab Results  Component Value Date   CHOL 201 (H) 06/25/2018   HDL 46 06/25/2018   LDLCALC 127 (H) 06/25/2018   TRIG 141 06/25/2018      Radiology Studies    DG Chest 2 View  Result Date: 10/09/2020 CLINICAL DATA:  Right-sided chest pain for 2 days EXAM: CHEST - 2 VIEW COMPARISON:  None. FINDINGS: Normal heart size and pulmonary vascularity. No focal airspace disease or consolidation in the lungs. No blunting of costophrenic angles. No pneumothorax. Mediastinal contours appear intact. Loop recorder. IMPRESSION: No active cardiopulmonary disease. Electronically Signed   By: Lucienne Capers M.D.   On: 10/09/2020 01:12    ECG & Cardiac Imaging    ECG: NSR, LVH with repolarization abnormality, baseline artifact - personally reviewed.  TTE 06/25/2018:   1. The left ventricle has normal systolic function, with an ejection  fraction of 55-60%. The cavity size was normal. Left ventricular  diastolic  Doppler parameters are consistent with impaired relaxation. Basal inferior  hypokinesis.   2. The right ventricle has normal systolic function. The cavity was  normal. There is no increase in right ventricular wall thickness.   3. No evidence of mitral valve stenosis. Trivial mitral regurgitation.   4. The aortic valve is tricuspid. Moderate calcification of the aortic  valve. Aortic valve regurgitation is moderate to severe by color flow  Doppler. Mild stenosis of the aortic valve, mean gradient 16 mmHg with AVA  1.72 cm^2. Suspect most likely  nodular calcification of the aortic valve, but with history of TIA/CVA,  would consider TEE to assess aortic insufficiency and rule out aortic  valve vegetation.   5. The aortic root and ascending aorta are normal in size and structure.   6. The IVC was normal in size. No  complete TR doppler jet so unable to  estimate PA systolic pressure.   7. Negative bubble study, no evidence for ASD or PFO.  (follow-up TEE was attempted but aborted due to recurrent vomiting with attempted probe insertion)  Carotid duplex US 2020: Right Carotid: The extracranial vessels were near-normal with only minimal wall thickening or plaque.  Left Carotid: The extracranial vessels were near-normal with only minimal wall thickening or plaque.  Vertebrals:  Bilateral vertebral arteries demonstrate antegrade flow.  Subclavians: Normal flow hemodynamics were seen in bilateral subclavian arteries.   Assessment & Plan    Atypical chest pain associated with severe hypertension and mild nonspecific troponin elevation. Differential includes aortic dissection, hypertensive crisis, aortic stenosis, and ACS, in addition to non-cardiovascular chest pain. Lower suspicion for HF.  - Please obtain CTA aortic dissection protocol - Continue to trend troponin. Check BNP - TTE in the morning - Agree with UDS, please ensure this is collected - Needs more aggressive blood  pressure lowering - can start ntg infusion for now with initial BP goal of 140/80  - Continue daily aspirin 81mg  - Needs to be on high-intensity statin (history of stroke) - Hold off on heparin pending CTA. If negative for dissection and next troponin significantly more elevated, can start on ACS protocol pending further work-up.   Signed, Marykay Lex, MD 10/09/2020, 3:17 AM  For questions or updates, please contact   Please consult [email or web address] for contact info under Cardiology/STEMI.

## 2020-10-09 NOTE — ED Provider Notes (Signed)
Emergency Medicine Provider Triage Evaluation Note  Leslie Walker , a 55 y.o. female  was evaluated in triage.  Pt complains of intermittent chest pain for the last week.  Patient reports it at times comes while she is working and sometimes comes when she is at rest.  She mows grass for living.  It is not specifically exertional.  She reports it is felt as a tightness and is associated with shortness of breath.  She reports sometimes she has diaphoresis and nausea and sometimes she does not.  No syncope or near syncope.  Denies a history of cardiac problems.  Patient reports episodes last about an hour.  She is chest pain-free at this time.  No treatments prior to arrival.  No specific aggravating or alleviating factors.  Review of Systems  Positive: Chest pain, shortness of breath, diaphoresis, nausea Negative: Syncope, near syncope  Physical Exam  BP (!) 185/96   Pulse 66   Temp 97.8 F (36.6 C) (Oral)   Resp 18   Ht 5\' 4"  (1.626 m)   Wt 76 kg   LMP  (LMP Unknown) Comment: Pt unsure why she no longer gets her period  SpO2 98%   BMI 28.76 kg/m  Gen:   Awake, no distress   Resp:  Normal effort  MSK:   Moves extremities without difficulty  Other:  Clear and equal breath sounds, regular rate and rhythm  Medical Decision Making  Medically screening exam initiated at 12:29 AM.  Appropriate orders placed.  Staphanie Karman was informed that the remainder of the evaluation will be completed by another provider, this initial triage assessment does not replace that evaluation, and the importance of remaining in the ED until their evaluation is complete.  Chest pain.  Labs and imaging pending.  Hypertension noted.  She does carry a diagnosis of same.   Lesli Issa, Gwenlyn Perking 10/09/20 [place], April, MD 10/09/20 0737

## 2020-10-09 NOTE — ED Notes (Signed)
Provider at bedside

## 2020-10-09 NOTE — H&P (View-Only) (Signed)
DAILY PROGRESS NOTE   Patient Name: Leslie Walker Date of Encounter: 10/09/2020 Cardiologist: None  Chief Complaint   No chest pain  Patient Profile   Leslie Walker is a 55 y.o. female with a history of hypertension, hyperlipidemia, cryptogenic stroke s/p ILR, smoking, and remote heroin abuse. She is being seen today (10/09/2020) for the evaluation of chest pain  Subjective   Leslie Walker reports no further chest pain. Blood pressure appears somewhat better controlled today. Troponins overnight were mildly elevated at 77 and then 100. She described to me typical anginal symptoms, which she thinks were precipitated when trying to carry a lawnmower out of her truck.  Her chest pressure has been going on for about a week.  Symptoms seem to be both with exertion and sometimes at rest.  She ultimately came to the emergency department as her symptoms were not subsiding. Overnight, Dr. Kalman Shan was concerned about possible dissection.  CT angiogram was negative for this however did show multivessel coronary calcium and atherosclerosis. Additionally there was reported to be calcified and noncalcified plaque of the right renal artery with approximately 70% stenosis at the origin.  She also had reported that she may have used cocaine 1 day.  Her UDS was positive for opiates, cocaine and THC.  BNP is elevated at 304.  Objective   Vitals:   10/09/20 0600 10/09/20 0700 10/09/20 0730 10/09/20 1000  BP: (!) 157/73 (!) 150/75 139/72 (!) 158/78  Pulse: 60 (!) 49 (!) 56 65  Resp: 13 13 15 16   Temp:   98.1 F (36.7 C)   TempSrc:   Oral   SpO2: 100% 97% 97% 100%  Weight:      Height:       No intake or output data in the 24 hours ending 10/09/20 1222 Filed Weights   10/09/20 0014  Weight: 76 kg    Physical Exam   General appearance: alert and no distress Neck: no carotid bruit, no JVD, and thyroid not enlarged, symmetric, no tenderness/mass/nodules Lungs: clear to auscultation bilaterally Heart:  regular rate and rhythm Abdomen: soft, non-tender; bowel sounds normal; no masses,  no organomegaly Extremities: extremities normal, atraumatic, no cyanosis or edema Pulses: 2+ and symmetric Skin: Skin color, texture, turgor normal. No rashes or lesions Neurologic: Grossly normal Psych: Pleasant  Inpatient Medications    Scheduled Meds:  aspirin EC  81 mg Oral Daily   atorvastatin  40 mg Oral q1800   enoxaparin (LOVENOX) injection  40 mg Subcutaneous Q24H   metoprolol tartrate  12.5 mg Oral Daily   sodium chloride flush  3 mL Intravenous Q12H    Continuous Infusions:   PRN Meds: acetaminophen, labetalol, ondansetron (ZOFRAN) IV   Labs   Results for orders placed or performed during the hospital encounter of 10/09/20 (from the past 48 hour(s))  CBC with Differential/Platelet     Status: Abnormal   Collection Time: 10/09/20 12:37 AM  Result Value Ref Range   WBC 6.7 4.0 - 10.5 K/uL   RBC 4.63 3.87 - 5.11 MIL/uL   Hemoglobin 13.9 12.0 - 15.0 g/dL   HCT 38.1 36.0 - 46.0 %   MCV 82.3 80.0 - 100.0 fL   MCH 30.0 26.0 - 34.0 pg   MCHC 36.5 (H) 30.0 - 36.0 g/dL   RDW 13.6 11.5 - 15.5 %   Platelets 212 150 - 400 K/uL   nRBC 0.0 0.0 - 0.2 %   Neutrophils Relative % 23 %   Neutro Abs 1.6 (L)  1.7 - 7.7 K/uL   Lymphocytes Relative 65 %   Lymphs Abs 4.4 (H) 0.7 - 4.0 K/uL   Monocytes Relative 7 %   Monocytes Absolute 0.5 0.1 - 1.0 K/uL   Eosinophils Relative 4 %   Eosinophils Absolute 0.3 0.0 - 0.5 K/uL   Basophils Relative 1 %   Basophils Absolute 0.1 0.0 - 0.1 K/uL   Immature Granulocytes 0 %   Abs Immature Granulocytes 0.01 0.00 - 0.07 K/uL    Comment: Performed at [facility] [address], [place], [place] [address]  Comprehensive metabolic panel     Status: Abnormal   Collection Time: 10/09/20 12:37 AM  Result Value Ref Range   Sodium 141 135 - 145 mmol/L   Potassium 4.8 3.5 - 5.1 mmol/L   Chloride 104 98 - 111 mmol/L   CO2 29 22 - 32 mmol/L   Glucose, Bld  117 (H) 70 - 99 mg/dL    Comment: Glucose reference range applies only to samples taken after fasting for at least 8 hours.   BUN 11 6 - 20 mg/dL   Creatinine, Ser 1.19 (H) 0.44 - 1.00 mg/dL   Calcium 9.0 8.9 - 10.3 mg/dL   Total Protein 6.4 (L) 6.5 - 8.1 g/dL   Albumin 3.3 (L) 3.5 - 5.0 g/dL   AST 25 15 - 41 U/L   ALT 13 0 - 44 U/L   Alkaline Phosphatase 72 38 - 126 U/L   Total Bilirubin 0.2 (L) 0.3 - 1.2 mg/dL   GFR, Estimated 54 (L) >60 mL/min    Comment: (NOTE) Calculated using the CKD-EPI Creatinine Equation (2021)    Anion gap 8 5 - 15    Comment: Performed at [facility], [place] [address], [place], [place] [address]  Troponin I (High Sensitivity)     Status: Abnormal   Collection Time: 10/09/20 12:37 AM  Result Value Ref Range   Troponin I (High Sensitivity) 77 (H) <18 ng/L    Comment: (NOTE) Elevated high sensitivity troponin I (hsTnI) values and significant  changes across serial measurements may suggest ACS but many other  chronic and acute conditions are known to elevate hsTnI results.  Refer to the "Links" section for chest pain algorithms and additional  guidance. Performed at [facility], [place] [address], [place], [place] [address]   Lipase, blood     Status: None   Collection Time: 10/09/20 12:37 AM  Result Value Ref Range   Lipase 18 11 - 51 U/L    Comment: Performed at [facility] [address], [place], [place] [address]  Brain natriuretic peptide     Status: Abnormal   Collection Time: 10/09/20 12:37 AM  Result Value Ref Range   B Natriuretic Peptide 304.5 (H) 0.0 - 100.0 pg/mL    Comment: Performed at [facility] [address] [place], [place] [address]  Resp Panel by RT-PCR (Flu A&B, Covid) Nasopharyngeal Swab     Status: None   Collection Time: 10/09/20  1:54 AM   Specimen: Nasopharyngeal Swab; Nasopharyngeal(NP) swabs in vial transport medium  Result Value Ref Range   SARS Coronavirus 2 by RT PCR NEGATIVE NEGATIVE     Comment: (NOTE) SARS-CoV-2 target nucleic acids are NOT DETECTED.  The SARS-CoV-2 RNA is generally detectable in upper respiratory specimens during the acute phase of infection. The lowest concentration of SARS-CoV-2 viral copies this assay can detect is 138 copies/mL. A negative result does not  preclude SARS-Cov-2 infection and should not be used as the sole basis for treatment or other patient management decisions. A negative result may occur with  improper specimen collection/handling, submission of specimen other than nasopharyngeal swab, presence of viral mutation(s) within the areas targeted by this assay, and inadequate number of viral copies(<138 copies/mL). A negative result must be combined with clinical observations, patient history, and epidemiological information. The expected result is Negative.  Fact Sheet for Patients:  EntrepreneurPulse.com.au  Fact Sheet for Healthcare Providers:  IncredibleEmployment.be  This test is no t yet approved or cleared by the [place] FDA and  has been authorized for detection and/or diagnosis of SARS-CoV-2 by FDA under an Emergency Use Authorization (EUA). This EUA will remain  in effect (meaning this test can be used) for the duration of the COVID-19 declaration under Section 564(b)(1) of the Act, 21 U.S.C.section 360bbb-3(b)(1), unless the authorization is terminated  or revoked sooner.       Influenza A by PCR NEGATIVE NEGATIVE   Influenza B by PCR NEGATIVE NEGATIVE    Comment: (NOTE) The Xpert Xpress SARS-CoV-2/FLU/RSV plus assay is intended as an aid in the diagnosis of influenza from Nasopharyngeal swab specimens and should not be used as a sole basis for treatment. Nasal washings and aspirates are unacceptable for Xpert Xpress SARS-CoV-2/FLU/RSV testing.  Fact Sheet for Patients: EntrepreneurPulse.com.au  Fact Sheet for Healthcare  Providers: IncredibleEmployment.be  This test is not yet approved or cleared by the [place] FDA and has been authorized for detection and/or diagnosis of SARS-CoV-2 by FDA under an Emergency Use Authorization (EUA). This EUA will remain in effect (meaning this test can be used) for the duration of the COVID-19 declaration under Section 564(b)(1) of the Act, 21 U.S.C. section 360bbb-3(b)(1), unless the authorization is terminated or revoked.  Performed at [facility], [place] [address] [place], [place] [address]   Rapid urine drug screen (hospital performed)     Status: Abnormal   Collection Time: 10/09/20  2:19 AM  Result Value Ref Range   Opiates POSITIVE (A) NONE DETECTED   Cocaine POSITIVE (A) NONE DETECTED   Benzodiazepines NONE DETECTED NONE DETECTED   Amphetamines NONE DETECTED NONE DETECTED   Tetrahydrocannabinol POSITIVE (A) NONE DETECTED   Barbiturates NONE DETECTED NONE DETECTED    Comment: (NOTE) DRUG SCREEN FOR MEDICAL PURPOSES ONLY.  IF CONFIRMATION IS NEEDED FOR ANY PURPOSE, NOTIFY LAB WITHIN 5 DAYS.  LOWEST DETECTABLE LIMITS FOR URINE DRUG SCREEN Drug Class                     Cutoff (ng/mL) Amphetamine and metabolites    1000 Barbiturate and metabolites    200 Benzodiazepine                 093 Tricyclics and metabolites     300 Opiates and metabolites        300 Cocaine and metabolites        300 THC                            50 Performed at [facility], [place] [address] [place], [place] [address]   Troponin I (High Sensitivity)     Status: Abnormal   Collection Time: 10/09/20  3:31 AM  Result Value Ref Range   Troponin I (High Sensitivity) 100 (HH) <18 ng/L    Comment: CRITICAL RESULT CALLED TO, READ BACK BY AND VERIFIED  WITH: TATE G,RN 10/09/20 0543 WAYK Performed at [facility], [place] [address], [place], [place] [address]     ECG   Normal sinus rhythm, right atrial enlargement, poor R wave progression  anteriorly- Personally Reviewed  Telemetry   Sinus rhythm- Personally Reviewed  Radiology    DG Chest 2 View  Result Date: 10/09/2020 CLINICAL DATA:  Right-sided chest pain for 2 days EXAM: CHEST - 2 VIEW COMPARISON:  None. FINDINGS: Normal heart size and pulmonary vascularity. No focal airspace disease or consolidation in the lungs. No blunting of costophrenic angles. No pneumothorax. Mediastinal contours appear intact. Loop recorder. IMPRESSION: No active cardiopulmonary disease. Electronically Signed   By: Lucienne Capers M.D.   On: 10/09/2020 01:12   CT Angio Chest/Abd/Pel for Dissection W and/or Wo Contrast  Result Date: 10/09/2020 CLINICAL DATA:  Rule out aortic dissection. Abdominal pain. Intermittent right chest pain. EXAM: CT ANGIOGRAPHY CHEST, ABDOMEN AND PELVIS TECHNIQUE: Non-contrast CT of the chest was initially obtained. Multidetector CT imaging through the chest, abdomen and pelvis was performed using the standard protocol during bolus administration of intravenous contrast. Multiplanar reconstructed images and MIPs were obtained and reviewed to evaluate the vascular anatomy. CONTRAST:  110mL OMNIPAQUE IOHEXOL 350 MG/ML SOLN COMPARISON:  CT AP 06/29/2018. FINDINGS: CTA CHEST FINDINGS Cardiovascular: Preferential opacification of the thoracic aorta. No evidence of thoracic aortic aneurysm or dissection. Normal heart size. No pericardial effusion. Aortic atherosclerosis. Coronary artery calcifications. No signs of central obstructing pulmonary embolus. Mediastinum/Nodes: No enlarged mediastinal, hilar, or axillary lymph nodes. Thyroid gland, trachea, and esophagus demonstrate no significant findings. Lungs/Pleura: No pleural effusion, airspace consolidation, atelectasis or pneumothorax. Subpleural nodule within the periphery of the right upper lobe measures 3 mm, image 34/8. Musculoskeletal: No chest wall abnormality. No acute or significant osseous findings. Review of the MIP images  confirms the above findings. CTA ABDOMEN AND PELVIS FINDINGS VASCULAR Aorta: Normal caliber aorta without aneurysm, dissection, vasculitis or significant stenosis. Celiac: Patent without evidence of aneurysm, dissection, vasculitis or significant stenosis. SMA: Patent without evidence of aneurysm, dissection, vasculitis or significant stenosis. Renals: Normal appearance of the left renal artery. Calcified and noncalcified plaque at the origin of the right renal artery is identified with approximately 70% stenosis. IMA: Patent without evidence of aneurysm, dissection, vasculitis or significant stenosis. Inflow: Patent without evidence of aneurysm, dissection, vasculitis or significant stenosis. Veins: No obvious venous abnormality within the limitations of this arterial phase study. Review of the MIP images confirms the above findings. NON-VASCULAR Hepatobiliary: No focal liver abnormality is seen. No gallstones, gallbladder wall thickening, or biliary dilatation. Pancreas: Unremarkable. No pancreatic ductal dilatation or surrounding inflammatory changes. Spleen: Normal in size without focal abnormality. Adrenals/Urinary Tract: Normal adrenal glands. Upper pole right kidney cyst measures 3.2 cm. No mass or hydronephrosis. Bladder unremarkable. Stomach/Bowel: Stomach is within normal limits. Appendix appears normal. No evidence of bowel wall thickening, distention, or inflammatory changes. Lymphatic: No enlarged lymph nodes Reproductive: Enlarged fibroid uterus. Other: No abdominal wall hernia or abnormality. No abdominopelvic ascites. Musculoskeletal: No acute or significant osseous findings. Review of the MIP images confirms the above findings. IMPRESSION: 1. No evidence for aortic dissection. 2. Coronary artery calcifications noted. 3. Calcified and noncalcified plaque at the origin of the right renal artery is identified with approximately 70% stenosis. 4. Subpleural nodule in the right upper lobe measures 3 mm.  Nonspecific. No follow-up needed if patient is low-risk. Non-contrast chest CT can be considered in 12 months if patient is high-risk. This recommendation follows  the consensus statement: Guidelines for Management of Incidental Pulmonary Nodules Detected on CT Images: From the Fleischner Society 2017; Radiology 2017; 284:228-243. 5. Enlarged fibroid uterus. 6. Aortic Atherosclerosis (ICD10-I70.0). Electronically Signed   By: Kerby Moors M.D.   On: 10/09/2020 05:40    Cardiac Studies   Echo pending  Assessment   Principal Problem:   NSTEMI (non-ST elevated myocardial infarction) [facility]) Active Problems:   Uncontrolled hypertension   Chest pain, precordial   Cocaine use   Plan   Leslie Walker had small troponin elevations in the setting of acute chest pain with ongoing symptoms that have been worsening over the past week.  She also has had some uncontrolled hypertension which is improved.  She reports recent cocaine use and her UDS was positive.  She also has some history of heroin dependence in her chart.  CT angiogram of the chest was negative for dissection or PE however multivessel coronary calcification and aortic atherosclerosis was noted as well as approximately 70% ostial right renal artery stenosis with mixed plaque (? contributing to uncontrolled hypertension).  I suspect this is cocaine related chest pain with abnormal troponins which could indicate spasm, plaque rupture or be related to demand ischemia in the setting of underlying coronary disease.  Based on this would recommend direct coronary catheterization.  Discussed the risks, benefits and alternatives with her today as well as the procedure in depth and she is willing to proceed.  Please keep her n.p.o..  Echocardiogram is pending.  BNP was mildly elevated however she does not appear overtly volume overloaded on exam.  Continue current medical therapy with aspirin, high potency statin and beta-blocker.  Time Spent Directly with  Patient:  I have spent a total of 25 minutes with the patient reviewing hospital notes, telemetry, EKGs, labs and examining the patient as well as establishing an assessment and plan that was discussed personally with the patient.  > 50% of time was spent in direct patient care.  Length of Stay:  LOS: 0 days   Pixie Casino, MD, [facility], [place] Director of the Advanced Lipid Disorders &  Cardiovascular Risk Reduction Clinic Diplomate of the American Board of Clinical Lipidology Attending Cardiologist  Direct Dial: [phone]  Fax: [phone]  Website:  [email or web address] 10/09/2020, 12:22 PM

## 2020-10-09 NOTE — ED Notes (Signed)
Patient was given packs of graham crackers and cheese packs.

## 2020-10-10 ENCOUNTER — Inpatient Hospital Stay (HOSPITAL_COMMUNITY): Payer: No Typology Code available for payment source

## 2020-10-10 DIAGNOSIS — F1910 Other psychoactive substance abuse, uncomplicated: Secondary | ICD-10-CM

## 2020-10-10 DIAGNOSIS — R011 Cardiac murmur, unspecified: Secondary | ICD-10-CM

## 2020-10-10 DIAGNOSIS — R911 Solitary pulmonary nodule: Secondary | ICD-10-CM

## 2020-10-10 LAB — CBC
HCT: 32.2 % — ABNORMAL LOW (ref 36.0–46.0)
Hemoglobin: 12 g/dL (ref 12.0–15.0)
MCH: 30.2 pg (ref 26.0–34.0)
MCHC: 37.3 g/dL — ABNORMAL HIGH (ref 30.0–36.0)
MCV: 80.9 fL (ref 80.0–100.0)
Platelets: 170 10*3/uL (ref 150–400)
RBC: 3.98 MIL/uL (ref 3.87–5.11)
RDW: 13.2 % (ref 11.5–15.5)
WBC: 5.5 10*3/uL (ref 4.0–10.5)
nRBC: 0 % (ref 0.0–0.2)

## 2020-10-10 LAB — BASIC METABOLIC PANEL
Anion gap: 8 (ref 5–15)
BUN: 7 mg/dL (ref 6–20)
CO2: 24 mmol/L (ref 22–32)
Calcium: 8.5 mg/dL — ABNORMAL LOW (ref 8.9–10.3)
Chloride: 106 mmol/L (ref 98–111)
Creatinine, Ser: 0.91 mg/dL (ref 0.44–1.00)
GFR, Estimated: >60 mL/min (ref >60)
Glucose, Bld: 104 mg/dL — ABNORMAL HIGH (ref 70–99)
Potassium: 3.5 mmol/L (ref 3.5–5.1)
Sodium: 138 mmol/L (ref 135–145)

## 2020-10-10 LAB — ECHOCARDIOGRAM COMPLETE
AR max vel: 1.62 cm2
AV Area VTI: 1.44 cm2
AV Area mean vel: 1.45 cm2
AV Mean grad: 18.7 mmHg
AV Peak grad: 33.1 mmHg
Ao pk vel: 2.88 m/s
Height: 64 in
P 1/2 time: 710 msec
S' Lateral: 3.6 cm
Weight: 2680.79 oz

## 2020-10-10 NOTE — Progress Notes (Signed)
TR BAND REMOVAL  LOCATION:    right radial  DEFLATED PER PROTOCOL:    Yes.    TIME BAND OFF / DRESSING APPLIED:    2300   SITE UPON ARRIVAL:    Level 1  SITE AFTER BAND REMOVAL:    Level 1  CIRCULATION SENSATION AND MOVEMENT:    Within Normal Limits   Yes.    COMMENTS:   Pt.tolerated procedure well

## 2020-10-10 NOTE — Progress Notes (Addendum)
[facility] PROGRESS NOTE    Progress Note  Leslie Walker  MWN:[id number] DOB: 1965/03/26 DOA: 10/09/2020 PCP: Elbert Ewings, FNP     Brief Narrative:   Leslie Walker is an 55 y.o. female past medical history significant for hypertension prior history of CVA presents with chest pain cardiology was consulted who recommended a cardiac cath   ignificant studies: 10/09/2020 PCI to the RCA moderate disease in the LAD and circumflex left main widely patent.  EF of 30%.  Cardiology recommended DAPT therapy for at least a year. 10/10/2018 two 2D echo is pending Antibiotics: None  Microbiology data: Blood culture:  Procedures: None  Assessment/Plan:   NSTEMI: She had a mild elevation in cardiac biomarkers cardiology was consulted, her UDS was positive for cocaine. CT angio of the chest was negative for PE, today showed multivessel coronary calcifications as well as right renal artery stenosis. Cardiology was consulted recommended a cardiac cath and she was placed on DAPT therapy. Cardiac cath also showed low EF.  2D echo has been ordered.  Blood pressure not at goal we will discuss with cardiology if she is a candidate for Entresto. Hemoglobin A1c of 5.6. Discontinue nitroglycerin drip.  Newly diagnosed systolic heart failure: By cath with a low EF 2D echo is pending. She is currently on metoprolol and ARB, will discuss with cardiology if she is a candidate for Entresto. Blood pressures not at goal and can probably tolerated.  Uncontrolled essential hypertension: Likely due to poor compliance with her medications at home. CT showed about 70% stenosis of the renal artery which is probably contributing to her hypertension. Her blood pressure is improved on losartan, metoprolol. She will have to be followed up as an outpatient and titrate antihypertensive medications as tolerated.  Polysubstance abuse: She has been counseled her UDS was positive for opiates and cocaine as  well as cannabis.  Hyperlipidemia: Continue Lipitor.  Right upper lobe subpleural nodule about 3 mm:  she will need a repeat CT scan in 12 months.  Tobacco use: Counseling.    DVT prophylaxis: lovenox Family Communication:none Status is: Observation  The patient will require care spanning > 2 midnights and should be moved to inpatient because: Hemodynamically unstable  Dispo: The patient is from: Home              Anticipated d/c is to: Home              Patient currently is not medically stable to d/c.   Difficult to place patient No        Code Status:     Code Status Orders  (From admission, onward)           Start     Ordered   10/09/20 0927  Full code  Continuous        10/09/20 0927           Code Status History     Date Active Date Inactive Code Status Order ID Comments User Context   06/24/2018 1648 06/30/2018 1723 Full Code [id number]  Mendel Corning, MD Inpatient   02/17/2014 0232 02/18/2014 1936 Full Code [id number]  Berle Mull, MD Inpatient         IV Access:   Peripheral IV   Procedures and diagnostic studies:   DG Chest 2 View  Result Date: 10/09/2020 CLINICAL DATA:  Right-sided chest pain for 2 days EXAM: CHEST - 2 VIEW COMPARISON:  None. FINDINGS: Normal heart size and pulmonary vascularity. No focal airspace disease  or consolidation in the lungs. No blunting of costophrenic angles. No pneumothorax. Mediastinal contours appear intact. Loop recorder. IMPRESSION: No active cardiopulmonary disease. Electronically Signed   By: Lucienne Capers M.D.   On: 10/09/2020 01:12   CARDIAC CATHETERIZATION  Result Date: 10/09/2020 CONCLUSIONS: 95-99% mid RCA treated with overlapping drug-eluting stents postdilated to 3.5 mm in diameter.  TIMI grade III flow was maintained pre and post stenting. Intermediate stenosis in the mid LAD [place] 111 stenosis estimated EF 50% and demonstrated to be hemodynamically insignificant with an RFR of 0.92. 70 to  80% mid to distal circumflex followed by 2 small obtuse marginal branches.  Medical therapy recommended. Left main is widely patent Acute on chronic systolic heart failure with EF 30 to 35%, LVEDP 20 mmHg.  Etiology is likely multifactorial (uncontrolled hypertension/substance abuse/ischemia). Recommendation: Dual antiplatelet therapy for 12 months.  First month should be aspirin and Brilinta.  Okay to switch to Plavix and aspirin after 30 days. Guideline directed therapy for heart failure.  Metoprolol and losartan have been prescribed.  Metoprolol should be switched to the succinate preparation. Control of blood pressure.  IV nitroglycerin is temporarily being used while metoprolol and losartan are being initiated.  Patient ordinarily takes Catapres but it is unclear how frequent this occurs. Aggressive secondary prevention.  This includes avoidance of substance use/tobacco/medication noncompliance.   CT Angio Chest/Abd/Pel for Dissection W and/or Wo Contrast  Result Date: 10/09/2020 CLINICAL DATA:  Rule out aortic dissection. Abdominal pain. Intermittent right chest pain. EXAM: CT ANGIOGRAPHY CHEST, ABDOMEN AND PELVIS TECHNIQUE: Non-contrast CT of the chest was initially obtained. Multidetector CT imaging through the chest, abdomen and pelvis was performed using the standard protocol during bolus administration of intravenous contrast. Multiplanar reconstructed images and MIPs were obtained and reviewed to evaluate the vascular anatomy. CONTRAST:  169mL OMNIPAQUE IOHEXOL 350 MG/ML SOLN COMPARISON:  CT AP 06/29/2018. FINDINGS: CTA CHEST FINDINGS Cardiovascular: Preferential opacification of the thoracic aorta. No evidence of thoracic aortic aneurysm or dissection. Normal heart size. No pericardial effusion. Aortic atherosclerosis. Coronary artery calcifications. No signs of central obstructing pulmonary embolus. Mediastinum/Nodes: No enlarged mediastinal, hilar, or axillary lymph nodes. Thyroid gland, trachea,  and esophagus demonstrate no significant findings. Lungs/Pleura: No pleural effusion, airspace consolidation, atelectasis or pneumothorax. Subpleural nodule within the periphery of the right upper lobe measures 3 mm, image 34/8. Musculoskeletal: No chest wall abnormality. No acute or significant osseous findings. Review of the MIP images confirms the above findings. CTA ABDOMEN AND PELVIS FINDINGS VASCULAR Aorta: Normal caliber aorta without aneurysm, dissection, vasculitis or significant stenosis. Celiac: Patent without evidence of aneurysm, dissection, vasculitis or significant stenosis. SMA: Patent without evidence of aneurysm, dissection, vasculitis or significant stenosis. Renals: Normal appearance of the left renal artery. Calcified and noncalcified plaque at the origin of the right renal artery is identified with approximately 70% stenosis. IMA: Patent without evidence of aneurysm, dissection, vasculitis or significant stenosis. Inflow: Patent without evidence of aneurysm, dissection, vasculitis or significant stenosis. Veins: No obvious venous abnormality within the limitations of this arterial phase study. Review of the MIP images confirms the above findings. NON-VASCULAR Hepatobiliary: No focal liver abnormality is seen. No gallstones, gallbladder wall thickening, or biliary dilatation. Pancreas: Unremarkable. No pancreatic ductal dilatation or surrounding inflammatory changes. Spleen: Normal in size without focal abnormality. Adrenals/Urinary Tract: Normal adrenal glands. Upper pole right kidney cyst measures 3.2 cm. No mass or hydronephrosis. Bladder unremarkable. Stomach/Bowel: Stomach is within normal limits. Appendix appears normal. No evidence of bowel wall thickening, distention, or  inflammatory changes. Lymphatic: No enlarged lymph nodes Reproductive: Enlarged fibroid uterus. Other: No abdominal wall hernia or abnormality. No abdominopelvic ascites. Musculoskeletal: No acute or significant osseous  findings. Review of the MIP images confirms the above findings. IMPRESSION: 1. No evidence for aortic dissection. 2. Coronary artery calcifications noted. 3. Calcified and noncalcified plaque at the origin of the right renal artery is identified with approximately 70% stenosis. 4. Subpleural nodule in the right upper lobe measures 3 mm. Nonspecific. No follow-up needed if patient is low-risk. Non-contrast chest CT can be considered in 12 months if patient is high-risk. This recommendation follows the consensus statement: Guidelines for Management of Incidental Pulmonary Nodules Detected on CT Images: From the Fleischner Society 2017; Radiology 2017; 284:228-243. 5. Enlarged fibroid uterus. 6. Aortic Atherosclerosis (ICD10-I70.0). Electronically Signed   By: Kerby Moors M.D.   On: 10/09/2020 05:40     Medical Consultants:   None.   Subjective:    Leslie Walker denies any chest pain or shortness of breath.  Objective:    Vitals:   10/09/20 2220 10/10/20 0116 10/10/20 0437 10/10/20 0738  BP: 111/66 121/75 135/61 134/71  Pulse: (!) 58 (!) 58 67 68  Resp:   16 17  Temp:   98.5 F (36.9 C) 98 F (36.7 C)  TempSrc:   Oral Oral  SpO2:  100% 100% 100%  Weight:      Height:       SpO2: 100 % O2 Flow Rate (L/min): 2 L/min  No intake or output data in the 24 hours ending 10/10/20 0837 Filed Weights   10/09/20 0014  Weight: 76 kg    Exam: General exam: In no acute distress. Respiratory system: Good air movement and clear to auscultation. Cardiovascular system: S1 & S2 heard, RRR. No JVD. Gastrointestinal system: Abdomen is nondistended, soft and nontender.  Extremities: No pedal edema. Skin: No rashes, lesions or ulcers Psychiatry: Judgement and insight appear normal. Mood & affect appropriate.    Data Reviewed:    Labs: Basic Metabolic Panel: Recent Labs  Lab 10/09/20 0037 10/10/20 0201  NA 141 138  K 4.8 3.5  CL 104 106  CO2 29 24  GLUCOSE 117* 104*  BUN 11 7   CREATININE 1.19* 0.91  CALCIUM 9.0 8.5*   GFR Estimated Creatinine Clearance: 68.9 mL/min (by C-G formula based on SCr of 0.91 mg/dL). Liver Function Tests: Recent Labs  Lab 10/09/20 0037  AST 25  ALT 13  ALKPHOS 72  BILITOT 0.2*  PROT 6.4*  ALBUMIN 3.3*   Recent Labs  Lab 10/09/20 0037  LIPASE 18   No results for input(s): AMMONIA in the last 168 hours. Coagulation profile No results for input(s): INR, PROTIME in the last 168 hours. COVID-19 Labs  No results for input(s): DDIMER, FERRITIN, LDH, CRP in the last 72 hours.  Lab Results  Component Value Date   SARSCOV2NAA NEGATIVE 10/09/2020   [place] NEGATIVE 06/24/2018    CBC: Recent Labs  Lab 10/09/20 0037 10/10/20 0201  WBC 6.7 5.5  NEUTROABS 1.6*  --   HGB 13.9 12.0  HCT 38.1 32.2*  MCV 82.3 80.9  PLT 212 170   Cardiac Enzymes: No results for input(s): CKTOTAL, CKMB, CKMBINDEX, TROPONINI in the last 168 hours. BNP (last 3 results) No results for input(s): PROBNP in the last 8760 hours. CBG: No results for input(s): GLUCAP in the last 168 hours. D-Dimer: No results for input(s): DDIMER in the last 72 hours. Hgb A1c: Recent Labs  10/09/20 1329  HGBA1C 5.6   Lipid Profile: Recent Labs    10/09/20 1329  CHOL 233*  HDL 56  LDLCALC 157*  TRIG 99  CHOLHDL 4.2   Thyroid function studies: No results for input(s): TSH, T4TOTAL, T3FREE, THYROIDAB in the last 72 hours.  Invalid input(s): FREET3 Anemia work up: No results for input(s): VITAMINB12, FOLATE, FERRITIN, TIBC, IRON, RETICCTPCT in the last 72 hours. Sepsis Labs: Recent Labs  Lab 10/09/20 0037 10/10/20 0201  WBC 6.7 5.5   Microbiology Recent Results (from the past 240 hour(s))  Resp Panel by RT-PCR (Flu A&B, Covid) Nasopharyngeal Swab     Status: None   Collection Time: 10/09/20  1:54 AM   Specimen: Nasopharyngeal Swab; Nasopharyngeal(NP) swabs in vial transport medium  Result Value Ref Range Status   SARS Coronavirus 2 by  RT PCR NEGATIVE NEGATIVE Final    Comment: (NOTE) SARS-CoV-2 target nucleic acids are NOT DETECTED.  The SARS-CoV-2 RNA is generally detectable in upper respiratory specimens during the acute phase of infection. The lowest concentration of SARS-CoV-2 viral copies this assay can detect is 138 copies/mL. A negative result does not preclude SARS-Cov-2 infection and should not be used as the sole basis for treatment or other patient management decisions. A negative result may occur with  improper specimen collection/handling, submission of specimen other than nasopharyngeal swab, presence of viral mutation(s) within the areas targeted by this assay, and inadequate number of viral copies(<138 copies/mL). A negative result must be combined with clinical observations, patient history, and epidemiological information. The expected result is Negative.  Fact Sheet for Patients:  EntrepreneurPulse.com.au  Fact Sheet for Healthcare Providers:  IncredibleEmployment.be  This test is no t yet approved or cleared by the [place] FDA and  has been authorized for detection and/or diagnosis of SARS-CoV-2 by FDA under an Emergency Use Authorization (EUA). This EUA will remain  in effect (meaning this test can be used) for the duration of the COVID-19 declaration under Section 564(b)(1) of the Act, 21 U.S.C.section 360bbb-3(b)(1), unless the authorization is terminated  or revoked sooner.       Influenza A by PCR NEGATIVE NEGATIVE Final   Influenza B by PCR NEGATIVE NEGATIVE Final    Comment: (NOTE) The Xpert Xpress SARS-CoV-2/FLU/RSV plus assay is intended as an aid in the diagnosis of influenza from Nasopharyngeal swab specimens and should not be used as a sole basis for treatment. Nasal washings and aspirates are unacceptable for Xpert Xpress SARS-CoV-2/FLU/RSV testing.  Fact Sheet for Patients: EntrepreneurPulse.com.au  Fact Sheet  for Healthcare Providers: IncredibleEmployment.be  This test is not yet approved or cleared by the [place] FDA and has been authorized for detection and/or diagnosis of SARS-CoV-2 by FDA under an Emergency Use Authorization (EUA). This EUA will remain in effect (meaning this test can be used) for the duration of the COVID-19 declaration under Section 564(b)(1) of the Act, 21 U.S.C. section 360bbb-3(b)(1), unless the authorization is terminated or revoked.  Performed at [facility], [place] [address], [place], [place] [address]      Medications:    aspirin EC  81 mg Oral Daily   atorvastatin  40 mg Oral q1800   enoxaparin (LOVENOX) injection  40 mg Subcutaneous Q24H   losartan  50 mg Oral Daily   metoprolol tartrate  50 mg Oral BID   nicotine  14 mg Transdermal Daily   sodium chloride flush  3 mL Intravenous Q12H   ticagrelor  90 mg Oral BID  Continuous Infusions:  sodium chloride     nitroGLYCERIN        LOS: 0 days   Charlynne Cousins  [facility]  10/10/2020, 8:37 AM

## 2020-10-10 NOTE — Progress Notes (Signed)
   Please eval BB in pt + with cocaine.  Will defer to MD  Cecilie Kicks, [place]  DTP:[phone] 10/10/2020.now

## 2020-10-10 NOTE — Progress Notes (Signed)
Progress Note  Patient Name: Leslie Walker Date of Encounter: 10/10/2020  [facility] Cardiologist: None   Subjective   NAEO. Doing well after PCI yesterday.  Inpatient Medications    Scheduled Meds:  aspirin EC  81 mg Oral Daily   atorvastatin  40 mg Oral q1800   enoxaparin (LOVENOX) injection  40 mg Subcutaneous Q24H   losartan  50 mg Oral Daily   metoprolol tartrate  50 mg Oral BID   nicotine  14 mg Transdermal Daily   sodium chloride flush  3 mL Intravenous Q12H   ticagrelor  90 mg Oral BID   Continuous Infusions:  sodium chloride     PRN Meds: sodium chloride, acetaminophen, gabapentin, hydrALAZINE, labetalol, ondansetron (ZOFRAN) IV, oxyCODONE   Vital Signs    Vitals:   10/10/20 0738 10/10/20 0937 10/10/20 0938 10/10/20 1016  BP: 134/71 133/82 133/82 132/64  Pulse: 68 65  61  Resp: 17   18  Temp: 98 F (36.7 C)   98.9 F (37.2 C)  TempSrc: Oral   Oral  SpO2: 100%   99%  Weight:      Height:       No intake or output data in the 24 hours ending 10/10/20 1114 Last 3 Weights 10/09/2020 11/21/2019 09/04/2018  Weight (lbs) 167 lb 8.8 oz 173 lb 3.2 oz 162 lb  Weight (kg) 76 kg 78.563 kg 73.483 kg      Telemetry    Personally Reviewed  ECG    Sinus. LVH. No ST deviation. Personally Reviewed  Physical Exam   GEN: No acute distress.   Neck: No JVD Cardiac: RRR, no murmurs, rubs, or gallops.  Respiratory: Clear to auscultation bilaterally. GI: Soft, nontender, non-distended  MS: No edema; No deformity. Neuro:  Nonfocal  Psych: Normal affect   Labs    High Sensitivity Troponin:   Recent Labs  Lab 10/09/20 0037 10/09/20 0331 10/09/20 1329 10/09/20 1720  TROPONINIHS 77* 100* 78* 73*     Chemistry Recent Labs  Lab 10/09/20 0037 10/10/20 0201  NA 141 138  K 4.8 3.5  CL 104 106  CO2 29 24  GLUCOSE 117* 104*  BUN 11 7  CREATININE 1.19* 0.91  CALCIUM 9.0 8.5*  PROT 6.4*  --   ALBUMIN 3.3*  --   AST 25  --   ALT 13  --   ALKPHOS  72  --   BILITOT 0.2*  --   GFRNONAA 54* >60  ANIONGAP 8 8    Lipids  Recent Labs  Lab 10/09/20 1329  CHOL 233*  TRIG 99  HDL 56  LDLCALC 157*  CHOLHDL 4.2    Hematology Recent Labs  Lab 10/09/20 0037 10/10/20 0201  WBC 6.7 5.5  RBC 4.63 3.98  HGB 13.9 12.0  HCT 38.1 32.2*  MCV 82.3 80.9  MCH 30.0 30.2  MCHC 36.5* 37.3*  RDW 13.6 13.2  PLT 212 170   Thyroid No results for input(s): TSH, FREET4 in the last 168 hours.  BNP Recent Labs  Lab 10/09/20 0037  BNP 304.5*    DDimer No results for input(s): DDIMER in the last 168 hours.   Radiology    DG Chest 2 View  Result Date: 10/09/2020 CLINICAL DATA:  Right-sided chest pain for 2 days EXAM: CHEST - 2 VIEW COMPARISON:  None. FINDINGS: Normal heart size and pulmonary vascularity. No focal airspace disease or consolidation in the lungs. No blunting of costophrenic angles. No pneumothorax. Mediastinal contours appear intact. Loop  recorder. IMPRESSION: No active cardiopulmonary disease. Electronically Signed   By: Lucienne Capers M.D.   On: 10/09/2020 01:12   CARDIAC CATHETERIZATION  Result Date: 10/09/2020 CONCLUSIONS: 95-99% mid RCA treated with overlapping drug-eluting stents postdilated to 3.5 mm in diameter.  TIMI grade III flow was maintained pre and post stenting. Intermediate stenosis in the mid LAD [place] 111 stenosis estimated EF 50% and demonstrated to be hemodynamically insignificant with an RFR of 0.92. 70 to 80% mid to distal circumflex followed by 2 small obtuse marginal branches.  Medical therapy recommended. Left main is widely patent Acute on chronic systolic heart failure with EF 30 to 35%, LVEDP 20 mmHg.  Etiology is likely multifactorial (uncontrolled hypertension/substance abuse/ischemia). Recommendation: Dual antiplatelet therapy for 12 months.  First month should be aspirin and Brilinta.  Okay to switch to Plavix and aspirin after 30 days. Guideline directed therapy for heart failure.  Metoprolol and  losartan have been prescribed.  Metoprolol should be switched to the succinate preparation. Control of blood pressure.  IV nitroglycerin is temporarily being used while metoprolol and losartan are being initiated.  Patient ordinarily takes Catapres but it is unclear how frequent this occurs. Aggressive secondary prevention.  This includes avoidance of substance use/tobacco/medication noncompliance.   CT Angio Chest/Abd/Pel for Dissection W and/or Wo Contrast  Result Date: 10/09/2020 CLINICAL DATA:  Rule out aortic dissection. Abdominal pain. Intermittent right chest pain. EXAM: CT ANGIOGRAPHY CHEST, ABDOMEN AND PELVIS TECHNIQUE: Non-contrast CT of the chest was initially obtained. Multidetector CT imaging through the chest, abdomen and pelvis was performed using the standard protocol during bolus administration of intravenous contrast. Multiplanar reconstructed images and MIPs were obtained and reviewed to evaluate the vascular anatomy. CONTRAST:  138mL OMNIPAQUE IOHEXOL 350 MG/ML SOLN COMPARISON:  CT AP 06/29/2018. FINDINGS: CTA CHEST FINDINGS Cardiovascular: Preferential opacification of the thoracic aorta. No evidence of thoracic aortic aneurysm or dissection. Normal heart size. No pericardial effusion. Aortic atherosclerosis. Coronary artery calcifications. No signs of central obstructing pulmonary embolus. Mediastinum/Nodes: No enlarged mediastinal, hilar, or axillary lymph nodes. Thyroid gland, trachea, and esophagus demonstrate no significant findings. Lungs/Pleura: No pleural effusion, airspace consolidation, atelectasis or pneumothorax. Subpleural nodule within the periphery of the right upper lobe measures 3 mm, image 34/8. Musculoskeletal: No chest wall abnormality. No acute or significant osseous findings. Review of the MIP images confirms the above findings. CTA ABDOMEN AND PELVIS FINDINGS VASCULAR Aorta: Normal caliber aorta without aneurysm, dissection, vasculitis or significant stenosis. Celiac:  Patent without evidence of aneurysm, dissection, vasculitis or significant stenosis. SMA: Patent without evidence of aneurysm, dissection, vasculitis or significant stenosis. Renals: Normal appearance of the left renal artery. Calcified and noncalcified plaque at the origin of the right renal artery is identified with approximately 70% stenosis. IMA: Patent without evidence of aneurysm, dissection, vasculitis or significant stenosis. Inflow: Patent without evidence of aneurysm, dissection, vasculitis or significant stenosis. Veins: No obvious venous abnormality within the limitations of this arterial phase study. Review of the MIP images confirms the above findings. NON-VASCULAR Hepatobiliary: No focal liver abnormality is seen. No gallstones, gallbladder wall thickening, or biliary dilatation. Pancreas: Unremarkable. No pancreatic ductal dilatation or surrounding inflammatory changes. Spleen: Normal in size without focal abnormality. Adrenals/Urinary Tract: Normal adrenal glands. Upper pole right kidney cyst measures 3.2 cm. No mass or hydronephrosis. Bladder unremarkable. Stomach/Bowel: Stomach is within normal limits. Appendix appears normal. No evidence of bowel wall thickening, distention, or inflammatory changes. Lymphatic: No enlarged lymph nodes Reproductive: Enlarged fibroid uterus. Other: No abdominal wall hernia or  abnormality. No abdominopelvic ascites. Musculoskeletal: No acute or significant osseous findings. Review of the MIP images confirms the above findings. IMPRESSION: 1. No evidence for aortic dissection. 2. Coronary artery calcifications noted. 3. Calcified and noncalcified plaque at the origin of the right renal artery is identified with approximately 70% stenosis. 4. Subpleural nodule in the right upper lobe measures 3 mm. Nonspecific. No follow-up needed if patient is low-risk. Non-contrast chest CT can be considered in 12 months if patient is high-risk. This recommendation follows the  consensus statement: Guidelines for Management of Incidental Pulmonary Nodules Detected on CT Images: From the Fleischner Society 2017; Radiology 2017; 284:228-243. 5. Enlarged fibroid uterus. 6. Aortic Atherosclerosis (ICD10-I70.0). Electronically Signed   By: Kerby Moors M.D.   On: 10/09/2020 05:40    Cardiac Studies   10/09/2020 LHC CONCLUSIONS: 95-99% mid RCA treated with overlapping drug-eluting stents postdilated to 3.5 mm in diameter.  TIMI grade III flow was maintained pre and post stenting. Intermediate stenosis in the mid LAD [place] 111 stenosis estimated EF 50% and demonstrated to be hemodynamically insignificant with an RFR of 0.92. 70 to 80% mid to distal circumflex followed by 2 small obtuse marginal branches.  Medical therapy recommended. Left main is widely patent Acute on chronic systolic heart failure with EF 30 to 35%, LVEDP 20 mmHg.  Etiology is likely multifactorial (uncontrolled hypertension/substance abuse/ischemia). Recommendation:   Dual antiplatelet therapy for 12 months.  First month should be aspirin and Brilinta.  Okay to switch to Plavix and aspirin after 30 days. Guideline directed therapy for heart failure.  Metoprolol and losartan have been prescribed.  Metoprolol should be switched to the succinate preparation. Control of blood pressure.  IV nitroglycerin is temporarily being used while metoprolol and losartan are being initiated.  Patient ordinarily takes Catapres but it is unclear how frequent this occurs. Aggressive secondary prevention.  This includes avoidance of substance use/tobacco/medication noncompliance.            Assessment & Plan   Ms Subia is a 55yo woman with polysubstance abuse who prsented with chest pain and mild troponin elevation. 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 She is now s/p PCI to the RCA. Medical therapy recommended for the mLAD, dLCx.  #CAD - cont asa 81 PO daily and brilenta 90mg  PO BID - Cont metoprolol - cont  atorva - echo pending. Will need to see the results of this prior to discharge to guide medical therapy.  #Polysubstance abuse - Counseled  #Suspected new diagnosis systolic heart failure - Cont losartan, metoprolol     For questions or updates, please contact [place] Please consult [email or web address] for contact info under        Signed, Vickie Epley, MD  10/10/2020, 11:14 AM

## 2020-10-10 NOTE — Progress Notes (Signed)
  Echocardiogram 2D Echocardiogram has been performed.  Leslie Walker 10/10/2020, 11:49 AM

## 2020-10-10 NOTE — Progress Notes (Signed)
CARDIAC REHAB PHASE I   Stent education completed with pt. Pt educated on importance of ASA and Brilinta. Stressed importance of med compliance. Pt given heart healthy diet. Reviewed site care, restrictions, and exercise guidelines. Encouraged stress management. Pt educated on importance of abstaining from drug use while on heart medications. Pt states it was "for fun", and is not a regular user. Encouraged cessation. Referred to [facility] [place].  [id number] Rufina Falco, RN BSN 10/10/2020 9:42 AM

## 2020-10-11 DIAGNOSIS — I5022 Chronic systolic (congestive) heart failure: Secondary | ICD-10-CM

## 2020-10-11 MED ORDER — LOSARTAN POTASSIUM 50 MG PO TABS: 50.0000 mg | ORAL_TABLET | Freq: Every day | ORAL | 3 refills | Status: DC

## 2020-10-11 MED ORDER — ASPIRIN 81 MG PO TBEC: 81.0000 mg | DELAYED_RELEASE_TABLET | Freq: Every day | ORAL | 12 refills | Status: DC

## 2020-10-11 MED ORDER — METOPROLOL TARTRATE 50 MG PO TABS: 50.0000 mg | ORAL_TABLET | Freq: Two times a day (BID) | ORAL | 3 refills | Status: DC

## 2020-10-11 MED ORDER — TICAGRELOR 90 MG PO TABS: 90.0000 mg | ORAL_TABLET | Freq: Two times a day (BID) | ORAL | 3 refills | Status: DC

## 2020-10-11 NOTE — Progress Notes (Signed)
Progress Note  Patient Name: Leslie Walker Date of Encounter: 10/11/2020  [facility] Cardiologist: None   Subjective   NAEO.  Inpatient Medications    Scheduled Meds:  aspirin EC  81 mg Oral Daily   atorvastatin  40 mg Oral q1800   enoxaparin (LOVENOX) injection  40 mg Subcutaneous Q24H   losartan  50 mg Oral Daily   metoprolol tartrate  50 mg Oral BID   nicotine  14 mg Transdermal Daily   sodium chloride flush  3 mL Intravenous Q12H   ticagrelor  90 mg Oral BID   Continuous Infusions:  sodium chloride     PRN Meds: sodium chloride, acetaminophen, gabapentin, hydrALAZINE, labetalol, ondansetron (ZOFRAN) IV, oxyCODONE   Vital Signs    Vitals:   10/10/20 1515 10/10/20 2043 10/11/20 0433 10/11/20 0744  BP: (!) 151/85 (!) 147/95 (!) 144/79 (!) 147/81  Pulse: 74 66 71 68  Resp: 19 18 18 19   Temp: 98.6 F (37 C) 98.6 F (37 C) 98.2 F (36.8 C) 98.6 F (37 C)  TempSrc: Oral Oral Oral Oral  SpO2: 97% 98% 99% 98%  Weight:      Height:        Intake/Output Summary (Last 24 hours) at 10/11/2020 0832 Last data filed at 10/10/2020 2217 Gross per 24 hour  Intake 265.46 ml  Output --  Net 265.46 ml   Last 3 Weights 10/09/2020 11/21/2019 09/04/2018  Weight (lbs) 167 lb 8.8 oz 173 lb 3.2 oz 162 lb  Weight (kg) 76 kg 78.563 kg 73.483 kg      Telemetry    Personally Reviewed  ECG    Personally Reviewed  Physical Exam   GEN: No acute distress.   Neck: No JVD Cardiac: RRR, no murmurs, rubs, or gallops.  Respiratory: Clear to auscultation bilaterally. GI: Soft, nontender, non-distended  MS: No edema; No deformity. Neuro:  Nonfocal  Psych: Normal affect   Labs    High Sensitivity Troponin:   Recent Labs  Lab 10/09/20 0037 10/09/20 0331 10/09/20 1329 10/09/20 1720  TROPONINIHS 77* 100* 78* 73*      Chemistry Recent Labs  Lab 10/09/20 0037 10/10/20 0201  NA 141 138  K 4.8 3.5  CL 104 106  CO2 29 24  GLUCOSE 117* 104*  BUN 11 7  CREATININE  1.19* 0.91  CALCIUM 9.0 8.5*  PROT 6.4*  --   ALBUMIN 3.3*  --   AST 25  --   ALT 13  --   ALKPHOS 72  --   BILITOT 0.2*  --   GFRNONAA 54* >60  ANIONGAP 8 8     Lipids  Recent Labs  Lab 10/09/20 1329  CHOL 233*  TRIG 99  HDL 56  LDLCALC 157*  CHOLHDL 4.2     Hematology Recent Labs  Lab 10/09/20 0037 10/10/20 0201  WBC 6.7 5.5  RBC 4.63 3.98  HGB 13.9 12.0  HCT 38.1 32.2*  MCV 82.3 80.9  MCH 30.0 30.2  MCHC 36.5* 37.3*  RDW 13.6 13.2  PLT 212 170    Thyroid No results for input(s): TSH, FREET4 in the last 168 hours.  BNP Recent Labs  Lab 10/09/20 0037  BNP 304.5*     DDimer No results for input(s): DDIMER in the last 168 hours.   Radiology    CARDIAC CATHETERIZATION  Result Date: 10/09/2020 CONCLUSIONS: 95-99% mid RCA treated with overlapping drug-eluting stents postdilated to 3.5 mm in diameter.  TIMI grade III flow was maintained  pre and post stenting. Intermediate stenosis in the mid LAD [place] 111 stenosis estimated EF 50% and demonstrated to be hemodynamically insignificant with an RFR of 0.92. 70 to 80% mid to distal circumflex followed by 2 small obtuse marginal branches.  Medical therapy recommended. Left main is widely patent Acute on chronic systolic heart failure with EF 30 to 35%, LVEDP 20 mmHg.  Etiology is likely multifactorial (uncontrolled hypertension/substance abuse/ischemia). Recommendation: Dual antiplatelet therapy for 12 months.  First month should be aspirin and Brilinta.  Okay to switch to Plavix and aspirin after 30 days. Guideline directed therapy for heart failure.  Metoprolol and losartan have been prescribed.  Metoprolol should be switched to the succinate preparation. Control of blood pressure.  IV nitroglycerin is temporarily being used while metoprolol and losartan are being initiated.  Patient ordinarily takes Catapres but it is unclear how frequent this occurs. Aggressive secondary prevention.  This includes avoidance of substance  use/tobacco/medication noncompliance.   ECHOCARDIOGRAM COMPLETE  Result Date: 10/10/2020    ECHOCARDIOGRAM REPORT   Patient Name:   Leslie Walker Date of Exam: 10/10/2020 Medical Rec #:  [id number]      Height:       64.0 in Accession #:    [id number]     Weight:       167.5 lb Date of Birth:  Feb 20, 1965      BSA:          1.815 m Patient Age:    55 years       BP:           132/64 mmHg Patient Gender: F              HR:           61 bpm. Exam Location:  Inpatient Procedure: 2D Echo, Cardiac Doppler and Color Doppler Indications:    Murmur  History:        Patient has prior history of Echocardiogram examinations, most                 recent 06/25/2018. Acute MI; Risk Factors:Hypertension,                 Dyslipidemia and Current Smoker. Hx stroke and substance abuse.  Sonographer:    Clayton Lefort RDCS (AE) Referring Phys: [place]  1. LVE and lower EF compared to TTE done 06/25/18. Left ventricular ejection fraction, by estimation, is 45 to 50%. The left ventricle has mildly decreased function. The left ventricle demonstrates global hypokinesis. The left ventricular internal cavity size was moderately dilated. There is severe left ventricular hypertrophy. Left ventricular diastolic parameters are indeterminate.  2. Right ventricular systolic function is normal. The right ventricular size is normal.  3. The mitral valve is abnormal. Mild mitral valve regurgitation. No evidence of mitral stenosis.  4. AV not well seen gradients higher than TTE done 06/25/18 some of gradient may be from [place]. AR pressure have time long not suggesting severe AR there is not holodistolic reversal in thoracic aorta also suggesting more moderate AR . The aortic valve was not well visualized. Aortic valve regurgitation is moderate. Moderate aortic valve stenosis.  5. The inferior vena cava is normal in size with greater than 50% respiratory variability, suggesting right atrial pressure of 3 mmHg. FINDINGS  Left Ventricle:  LVE and lower EF compared to TTE done 06/25/18. Left ventricular ejection fraction, by estimation, is 45 to 50%. The left ventricle has mildly decreased function. The left ventricle demonstrates  global hypokinesis. The left ventricular internal cavity size was moderately dilated. There is severe left ventricular hypertrophy. Left ventricular diastolic parameters are indeterminate. Right Ventricle: The right ventricular size is normal. No increase in right ventricular wall thickness. Right ventricular systolic function is normal. Left Atrium: Left atrial size was normal in size. Right Atrium: Right atrial size was normal in size. Pericardium: There is no evidence of pericardial effusion. Mitral Valve: The mitral valve is abnormal. There is mild thickening of the mitral valve leaflet(s). There is mild calcification of the mitral valve leaflet(s). Mild mitral valve regurgitation. No evidence of mitral valve stenosis. Tricuspid Valve: The tricuspid valve is normal in structure. Tricuspid valve regurgitation is mild . No evidence of tricuspid stenosis. Aortic Valve: AV not well seen gradients higher than TTE done 06/25/18 some of gradient may be from [place]. AR pressure have time long not suggesting severe AR there is not holodistolic reversal in thoracic aorta also suggesting more moderate AR. The aortic valve was not well visualized. Aortic valve regurgitation is moderate. Aortic regurgitation PHT measures 710 msec. Moderate aortic stenosis is present. Aortic valve mean gradient measures 18.7 mmHg. Aortic valve peak gradient measures 33.1 mmHg. Aortic valve area, by VTI measures 1.44 cm. Pulmonic Valve: The pulmonic valve was normal in structure. Pulmonic valve regurgitation is not visualized. No evidence of pulmonic stenosis. Aorta: The aortic root is normal in size and structure. Venous: The inferior vena cava is normal in size with greater than 50% respiratory variability, suggesting right atrial pressure of 3 mmHg.  IAS/Shunts: No atrial level shunt detected by color flow Doppler.  LEFT VENTRICLE PLAX 2D LVIDd:         5.20 cm  Diastology LVIDs:         3.60 cm  LV e' medial:  5.44 cm/s LV PW:         1.40 cm  LV e' lateral: 5.22 cm/s LV IVS:        1.60 cm LVOT diam:     1.90 cm LV SV:         89 LV SV Index:   49 LVOT Area:     2.84 cm  RIGHT VENTRICLE            IVC RV Basal diam:  2.80 cm    IVC diam: 0.90 cm RV S prime:     9.97 cm/s TAPSE (M-mode): 2.1 cm LEFT ATRIUM             Index       RIGHT ATRIUM           Index LA diam:        3.50 cm 1.93 cm/m  RA Area:     12.80 cm LA Vol (A2C):   59.3 ml 32.68 ml/m RA Volume:   29.30 ml  16.15 ml/m LA Vol (A4C):   44.6 ml 24.58 ml/m LA Biplane Vol: 56.8 ml 31.30 ml/m  AORTIC VALVE AV Area (Vmax):    1.62 cm AV Area (Vmean):   1.45 cm AV Area (VTI):     1.44 cm AV Vmax:           287.67 cm/s AV Vmean:          201.667 cm/s AV VTI:            0.618 m AV Peak Grad:      33.1 mmHg AV Mean Grad:      18.7 mmHg LVOT Vmax:  164.00 cm/s LVOT Vmean:        103.000 cm/s LVOT VTI:          0.314 m LVOT/AV VTI ratio: 0.51 AI PHT:            710 msec  AORTA Ao Root diam: 2.90 cm Ao Asc diam:  3.40 cm  SHUNTS Systemic VTI:  0.31 m Systemic Diam: 1.90 cm Jenkins Rouge MD Electronically signed by Jenkins Rouge MD Signature Date/Time: 10/10/2020/1:10:55 PM    Final     Cardiac Studies   10/09/2020 LHC CONCLUSIONS: 95-99% mid RCA treated with overlapping drug-eluting stents postdilated to 3.5 mm in diameter.  TIMI grade III flow was maintained pre and post stenting. Intermediate stenosis in the mid LAD [place] 111 stenosis estimated EF 50% and demonstrated to be hemodynamically insignificant with an RFR of 0.92. 70 to 80% mid to distal circumflex followed by 2 small obtuse marginal branches.  Medical therapy recommended. Left main is widely patent Acute on chronic systolic heart failure with EF 30 to 35%, LVEDP 20 mmHg.  Etiology is likely multifactorial (uncontrolled  hypertension/substance abuse/ischemia). Recommendation:   Dual antiplatelet therapy for 12 months.  First month should be aspirin and Brilinta.  Okay to switch to Plavix and aspirin after 30 days. Guideline directed therapy for heart failure.  Metoprolol and losartan have been prescribed.  Metoprolol should be switched to the succinate preparation. Control of blood pressure.  IV nitroglycerin is temporarily being used while metoprolol and losartan are being initiated.  Patient ordinarily takes Catapres but it is unclear how frequent this occurs. Aggressive secondary prevention.  This includes avoidance of substance use/tobacco/medication noncompliance.   10/10/2020 TTE personally reviewed LVEF 50% Severe LVH RV normal Mild MR Moderate AI          Assessment & Plan   Ms Rarick is a 55yo woman with polysubstance abuse who prsented with chest pain and mild troponin elevation. 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 She is now s/p PCI to the RCA. Medical therapy recommended for the mLAD, dLCx.  #CAD - cont asa 81 PO daily and brilenta 90mg  PO BID - Cont metoprolol - cont atorva  #Polysubstance abuse - Counseled  #Chronic systolic heart failure New diagnosis NYHA II LVEF 45-50% - Cont losartan, metoprolol  #Moderate Aortic Insufficiency Close cardiology follow up    For questions or updates, please contact [place] Please consult [email or web address] for contact info under        Signed, Vickie Epley, MD  10/11/2020, 8:32 AM

## 2020-10-11 NOTE — Discharge Summary (Signed)
Physician Discharge Summary  Simar Pothier BJS:[id number] DOB: May 15, 1965 DOA: 10/09/2020  PCP: Elbert Ewings, FNP  Admit date: 10/09/2020 Discharge date: 10/11/2020  Admitted From: home Disposition:  Home  Recommendations for Outpatient Follow-up:  Follow up with Cards in 1-2 weeks Please obtain BMP/CBC in one week Follow-up with PCP in 2 weeks will need a CT follow-up of her right subpleural lung nodule   Home Health:no Equipment/Devices:none  Discharge Condition:Stable CODE STATUS:Full Diet recommendation: Heart Healthy  Brief/Interim Summary: 55 y.o. female past medical history significant for hypertension prior history of CVA presents with chest pain cardiology was consulted who recommended a cardiac cath     ignificant studies: 10/09/2020 PCI to the RCA moderate disease in the LAD and circumflex left main widely patent.  EF of 30%.  Cardiology recommended DAPT therapy for at least a year. 10/10/2018 two 2D echo is pending  Discharge Diagnoses:  Principal Problem:   Chest pain, precordial Active Problems:   Uncontrolled hypertension   NSTEMI (non-ST elevated myocardial infarction) ([place])   Polysubstance abuse ([place])   Dyslipidemia   Lung nodule   Tobacco dependence  NSTEMI: Felt mild elevation in cardiac markers cardiology was consulted, her UDS was positive for cocaine. CT angio of the chest was negative for PE but did showed multivessel coronary calcification as well as right renal artery stenosis. 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 A 2D echo was done that showed an EF of 45% with global hypokinesia and moderately dilated left ventricle. Her A1c was 8.6. She was placed on metoprolol, ARB, aspirin and Brilinta which she will continue as an outpatient.  Newly diagnosed systolic heart failure/ischemic cardiomyopathy: With a 2D echo that showed an EF of 45%. She was continued at a higher dose of metoprolol,  clonidine was discontinued she was started on ARB. Blood pressure improved she will follow-up with cardiology in 2 weeks titrate antihypertensive medications as tolerated.  Uncontrolled essential hypertension: Likely due to poor compliance with her medication. CT showed about 70% stenosis of the renal artery probably contributing to her hypertension. She was started on metoprolol and ARB her blood pressure improved she will have to follow-up with cardiology as an outpatient and titrate antihypertensive medications then.  Polysubstance abuse: Her UDS was positive for cocaine opiates and marijuana. She has been counseled.  Dyslipidemia: Continue Lipitor.  Right upper lobe subpleural nodule about 3 mm: She will need a repeat CT scan of the chest in 12 months.  Tobacco abuse: Counseling.  Discharge Instructions  Discharge Instructions     Amb Referral to Cardiac Rehabilitation   Complete by: As directed    Diagnosis: Coronary Stents   After initial evaluation and assessments completed: Virtual Based Care may be provided alone or in conjunction with Phase 2 Cardiac Rehab based on patient barriers.: Yes   Diet - low sodium heart healthy   Complete by: As directed    Increase activity slowly   Complete by: As directed       Allergies as of 10/11/2020   No Known Allergies      Medication List     STOP taking these medications    cloNIDine 0.1 MG tablet Commonly known as: CATAPRES       TAKE these medications    aspirin 81 MG EC tablet Take 1 tablet (81 mg total) by mouth daily.   atorvastatin 40 MG tablet Commonly known as: LIPITOR Take 1 tablet (40 mg total) by mouth daily at  6 PM.   furosemide 20 MG tablet Commonly known as: LASIX Take 20 mg by mouth daily.   gabapentin 100 MG capsule Commonly known as: NEURONTIN Take 100 mg by mouth 3 (three) times daily as needed (nerve pain).   hydrOXYzine 25 MG tablet Commonly known as: ATARAX/VISTARIL Take 1 tablet  (25 mg total) by mouth every 6 (six) hours as needed for anxiety.   losartan 50 MG tablet Commonly known as: COZAAR Take 1 tablet (50 mg total) by mouth daily.   metoprolol tartrate 50 MG tablet Commonly known as: LOPRESSOR Take 1 tablet (50 mg total) by mouth 2 (two) times daily. What changed: Another medication with the same name was removed. Continue taking this medication, and follow the directions you see here.   ticagrelor 90 MG Tabs tablet Commonly known as: BRILINTA Take 1 tablet (90 mg total) by mouth 2 (two) times daily.        Follow-up Information     Elbert Ewings, FNP.   Specialty: Nurse Practitioner Contact information: [address] [place] [address] [phone]                No Known Allergies  Consultations: Cardiology   Procedures/Studies: DG Chest 2 View  Result Date: 10/09/2020 CLINICAL DATA:  Right-sided chest pain for 2 days EXAM: CHEST - 2 VIEW COMPARISON:  None. FINDINGS: Normal heart size and pulmonary vascularity. No focal airspace disease or consolidation in the lungs. No blunting of costophrenic angles. No pneumothorax. Mediastinal contours appear intact. Loop recorder. IMPRESSION: No active cardiopulmonary disease. Electronically Signed   By: Lucienne Capers M.D.   On: 10/09/2020 01:12   CARDIAC CATHETERIZATION  Result Date: 10/09/2020 CONCLUSIONS: 95-99% mid RCA treated with overlapping drug-eluting stents postdilated to 3.5 mm in diameter.  TIMI grade III flow was maintained pre and post stenting. Intermediate stenosis in the mid LAD [place] 111 stenosis estimated EF 50% and demonstrated to be hemodynamically insignificant with an RFR of 0.92. 70 to 80% mid to distal circumflex followed by 2 small obtuse marginal branches.  Medical therapy recommended. Left main is widely patent Acute on chronic systolic heart failure with EF 30 to 35%, LVEDP 20 mmHg.  Etiology is likely multifactorial (uncontrolled  hypertension/substance abuse/ischemia). Recommendation: Dual antiplatelet therapy for 12 months.  First month should be aspirin and Brilinta.  Okay to switch to Plavix and aspirin after 30 days. Guideline directed therapy for heart failure.  Metoprolol and losartan have been prescribed.  Metoprolol should be switched to the succinate preparation. Control of blood pressure.  IV nitroglycerin is temporarily being used while metoprolol and losartan are being initiated.  Patient ordinarily takes Catapres but it is unclear how frequent this occurs. Aggressive secondary prevention.  This includes avoidance of substance use/tobacco/medication noncompliance.   ECHOCARDIOGRAM COMPLETE  Result Date: 10/10/2020    ECHOCARDIOGRAM REPORT   Patient Name:   SAVANNAHA STONEROCK Date of Exam: 10/10/2020 Medical Rec #:  [id number]      Height:       64.0 in Accession #:    [id number]     Weight:       167.5 lb Date of Birth:  [birth year]      BSA:          1.815 m Patient Age:    55 years       BP:           132/64 mmHg Patient Gender: F  HR:           61 bpm. Exam Location:  Inpatient Procedure: 2D Echo, Cardiac Doppler and Color Doppler Indications:    Murmur  History:        Patient has prior history of Echocardiogram examinations, most                 recent 06/25/2018. Acute MI; Risk Factors:Hypertension,                 Dyslipidemia and Current Smoker. Hx stroke and substance abuse.  Sonographer:    Clayton Lefort RDCS (AE) Referring Phys: [facility]  1. LVE and lower EF compared to TTE done 06/25/18. Left ventricular ejection fraction, by estimation, is 45 to 50%. The left ventricle has mildly decreased function. The left ventricle demonstrates global hypokinesis. The left ventricular internal cavity size was moderately dilated. There is severe left ventricular hypertrophy. Left ventricular diastolic parameters are indeterminate.  2. Right ventricular systolic function is normal. The right ventricular size  is normal.  3. The mitral valve is abnormal. Mild mitral valve regurgitation. No evidence of mitral stenosis.  4. AV not well seen gradients higher than TTE done 06/25/18 some of gradient may be from [place]. AR pressure have time long not suggesting severe AR there is not holodistolic reversal in thoracic aorta also suggesting more moderate AR . The aortic valve was not well visualized. Aortic valve regurgitation is moderate. Moderate aortic valve stenosis.  5. The inferior vena cava is normal in size with greater than 50% respiratory variability, suggesting right atrial pressure of 3 mmHg. FINDINGS  Left Ventricle: LVE and lower EF compared to TTE done 06/25/18. Left ventricular ejection fraction, by estimation, is 45 to 50%. The left ventricle has mildly decreased function. The left ventricle demonstrates global hypokinesis. The left ventricular internal cavity size was moderately dilated. There is severe left ventricular hypertrophy. Left ventricular diastolic parameters are indeterminate. Right Ventricle: The right ventricular size is normal. No increase in right ventricular wall thickness. Right ventricular systolic function is normal. Left Atrium: Left atrial size was normal in size. Right Atrium: Right atrial size was normal in size. Pericardium: There is no evidence of pericardial effusion. Mitral Valve: The mitral valve is abnormal. There is mild thickening of the mitral valve leaflet(s). There is mild calcification of the mitral valve leaflet(s). Mild mitral valve regurgitation. No evidence of mitral valve stenosis. Tricuspid Valve: The tricuspid valve is normal in structure. Tricuspid valve regurgitation is mild . No evidence of tricuspid stenosis. Aortic Valve: AV not well seen gradients higher than TTE done 06/25/18 some of gradient may be from [place]. AR pressure have time long not suggesting severe AR there is not holodistolic reversal in thoracic aorta also suggesting more moderate AR. The aortic valve was not  well visualized. Aortic valve regurgitation is moderate. Aortic regurgitation PHT measures 710 msec. Moderate aortic stenosis is present. Aortic valve mean gradient measures 18.7 mmHg. Aortic valve peak gradient measures 33.1 mmHg. Aortic valve area, by VTI measures 1.44 cm. Pulmonic Valve: The pulmonic valve was normal in structure. Pulmonic valve regurgitation is not visualized. No evidence of pulmonic stenosis. Aorta: The aortic root is normal in size and structure. Venous: The inferior vena cava is normal in size with greater than 50% respiratory variability, suggesting right atrial pressure of 3 mmHg. IAS/Shunts: No atrial level shunt detected by color flow Doppler.  LEFT VENTRICLE PLAX 2D LVIDd:         5.20 cm  Diastology LVIDs:         3.60 cm  LV e' medial:  5.44 cm/s LV PW:         1.40 cm  LV e' lateral: 5.22 cm/s LV IVS:        1.60 cm LVOT diam:     1.90 cm LV SV:         89 LV SV Index:   49 LVOT Area:     2.84 cm  RIGHT VENTRICLE            IVC RV Basal diam:  2.80 cm    IVC diam: 0.90 cm RV S prime:     9.97 cm/s TAPSE (M-mode): 2.1 cm LEFT ATRIUM             Index       RIGHT ATRIUM           Index LA diam:        3.50 cm 1.93 cm/m  RA Area:     12.80 cm LA Vol (A2C):   59.3 ml 32.68 ml/m RA Volume:   29.30 ml  16.15 ml/m LA Vol (A4C):   44.6 ml 24.58 ml/m LA Biplane Vol: 56.8 ml 31.30 ml/m  AORTIC VALVE AV Area (Vmax):    1.62 cm AV Area (Vmean):   1.45 cm AV Area (VTI):     1.44 cm AV Vmax:           287.67 cm/s AV Vmean:          201.667 cm/s AV VTI:            0.618 m AV Peak Grad:      33.1 mmHg AV Mean Grad:      18.7 mmHg LVOT Vmax:         164.00 cm/s LVOT Vmean:        103.000 cm/s LVOT VTI:          0.314 m LVOT/AV VTI ratio: 0.51 AI PHT:            710 msec  AORTA Ao Root diam: 2.90 cm Ao Asc diam:  3.40 cm  SHUNTS Systemic VTI:  0.31 m Systemic Diam: 1.90 cm Jenkins Rouge MD Electronically signed by Jenkins Rouge MD Signature Date/Time: 10/10/2020/1:10:55 PM    Final    CT  Angio Chest/Abd/Pel for Dissection W and/or Wo Contrast  Result Date: 10/09/2020 CLINICAL DATA:  Rule out aortic dissection. Abdominal pain. Intermittent right chest pain. EXAM: CT ANGIOGRAPHY CHEST, ABDOMEN AND PELVIS TECHNIQUE: Non-contrast CT of the chest was initially obtained. Multidetector CT imaging through the chest, abdomen and pelvis was performed using the standard protocol during bolus administration of intravenous contrast. Multiplanar reconstructed images and MIPs were obtained and reviewed to evaluate the vascular anatomy. CONTRAST:  156mL OMNIPAQUE IOHEXOL 350 MG/ML SOLN COMPARISON:  CT AP 06/29/2018. FINDINGS: CTA CHEST FINDINGS Cardiovascular: Preferential opacification of the thoracic aorta. No evidence of thoracic aortic aneurysm or dissection. Normal heart size. No pericardial effusion. Aortic atherosclerosis. Coronary artery calcifications. No signs of central obstructing pulmonary embolus. Mediastinum/Nodes: No enlarged mediastinal, hilar, or axillary lymph nodes. Thyroid gland, trachea, and esophagus demonstrate no significant findings. Lungs/Pleura: No pleural effusion, airspace consolidation, atelectasis or pneumothorax. Subpleural nodule within the periphery of the right upper lobe measures 3 mm, image 34/8. Musculoskeletal: No chest wall abnormality. No acute or significant osseous findings. Review of the MIP images confirms the above findings. CTA ABDOMEN AND PELVIS FINDINGS VASCULAR Aorta: Normal caliber aorta  without aneurysm, dissection, vasculitis or significant stenosis. Celiac: Patent without evidence of aneurysm, dissection, vasculitis or significant stenosis. SMA: Patent without evidence of aneurysm, dissection, vasculitis or significant stenosis. Renals: Normal appearance of the left renal artery. Calcified and noncalcified plaque at the origin of the right renal artery is identified with approximately 70% stenosis. IMA: Patent without evidence of aneurysm, dissection,  vasculitis or significant stenosis. Inflow: Patent without evidence of aneurysm, dissection, vasculitis or significant stenosis. Veins: No obvious venous abnormality within the limitations of this arterial phase study. Review of the MIP images confirms the above findings. NON-VASCULAR Hepatobiliary: No focal liver abnormality is seen. No gallstones, gallbladder wall thickening, or biliary dilatation. Pancreas: Unremarkable. No pancreatic ductal dilatation or surrounding inflammatory changes. Spleen: Normal in size without focal abnormality. Adrenals/Urinary Tract: Normal adrenal glands. Upper pole right kidney cyst measures 3.2 cm. No mass or hydronephrosis. Bladder unremarkable. Stomach/Bowel: Stomach is within normal limits. Appendix appears normal. No evidence of bowel wall thickening, distention, or inflammatory changes. Lymphatic: No enlarged lymph nodes Reproductive: Enlarged fibroid uterus. Other: No abdominal wall hernia or abnormality. No abdominopelvic ascites. Musculoskeletal: No acute or significant osseous findings. Review of the MIP images confirms the above findings. IMPRESSION: 1. No evidence for aortic dissection. 2. Coronary artery calcifications noted. 3. Calcified and noncalcified plaque at the origin of the right renal artery is identified with approximately 70% stenosis. 4. Subpleural nodule in the right upper lobe measures 3 mm. Nonspecific. No follow-up needed if patient is low-risk. Non-contrast chest CT can be considered in 12 months if patient is high-risk. This recommendation follows the consensus statement: Guidelines for Management of Incidental Pulmonary Nodules Detected on CT Images: From the Fleischner Society 2017; Radiology 2017; 284:228-243. 5. Enlarged fibroid uterus. 6. Aortic Atherosclerosis (ICD10-I70.0). Electronically Signed   By: Kerby Moors M.D.   On: 10/09/2020 05:40   (Echo, Carotid, EGD, Colonoscopy, ERCP)    Subjective: No complaints  Discharge Exam: Vitals:    10/11/20 0433 10/11/20 0744  BP: (!) 144/79 (!) 147/81  Pulse: 71 68  Resp: 18 19  Temp: 98.2 F (36.8 C) 98.6 F (37 C)  SpO2: 99% 98%   Vitals:   10/10/20 1515 10/10/20 2043 10/11/20 0433 10/11/20 0744  BP: (!) 151/85 (!) 147/95 (!) 144/79 (!) 147/81  Pulse: 74 66 71 68  Resp: 19 18 18 19   Temp: 98.6 F (37 C) 98.6 F (37 C) 98.2 F (36.8 C) 98.6 F (37 C)  TempSrc: Oral Oral Oral Oral  SpO2: 97% 98% 99% 98%  Weight:      Height:        General: Pt is alert, awake, not in acute distress Cardiovascular: RRR, S1/S2 +, no rubs, no gallops Respiratory: CTA bilaterally, no wheezing, no rhonchi Abdominal: Soft, NT, ND, bowel sounds + Extremities: no edema, no cyanosis    The results of significant diagnostics from this hospitalization (including imaging, microbiology, ancillary and laboratory) are listed below for reference.     Microbiology: Recent Results (from the past 240 hour(s))  Resp Panel by RT-PCR (Flu A&B, Covid) Nasopharyngeal Swab     Status: None   Collection Time: 10/09/20  1:54 AM   Specimen: Nasopharyngeal Swab; Nasopharyngeal(NP) swabs in vial transport medium  Result Value Ref Range Status   SARS Coronavirus 2 by RT PCR NEGATIVE NEGATIVE Final    Comment: (NOTE) SARS-CoV-2 target nucleic acids are NOT DETECTED.  The SARS-CoV-2 RNA is generally detectable in upper respiratory specimens during the acute phase of infection. The  lowest concentration of SARS-CoV-2 viral copies this assay can detect is 138 copies/mL. A negative result does not preclude SARS-Cov-2 infection and should not be used as the sole basis for treatment or other patient management decisions. A negative result may occur with  improper specimen collection/handling, submission of specimen other than nasopharyngeal swab, presence of viral mutation(s) within the areas targeted by this assay, and inadequate number of viral copies(<138 copies/mL). A negative result must be combined  with clinical observations, patient history, and epidemiological information. The expected result is Negative.  Fact Sheet for Patients:  EntrepreneurPulse.com.au  Fact Sheet for Healthcare Providers:  IncredibleEmployment.be  This test is no t yet approved or cleared by the [place] FDA and  has been authorized for detection and/or diagnosis of SARS-CoV-2 by FDA under an Emergency Use Authorization (EUA). This EUA will remain  in effect (meaning this test can be used) for the duration of the COVID-19 declaration under Section 564(b)(1) of the Act, 21 U.S.C.section 360bbb-3(b)(1), unless the authorization is terminated  or revoked sooner.       Influenza A by PCR NEGATIVE NEGATIVE Final   Influenza B by PCR NEGATIVE NEGATIVE Final    Comment: (NOTE) The Xpert Xpress SARS-CoV-2/FLU/RSV plus assay is intended as an aid in the diagnosis of influenza from Nasopharyngeal swab specimens and should not be used as a sole basis for treatment. Nasal washings and aspirates are unacceptable for Xpert Xpress SARS-CoV-2/FLU/RSV testing.  Fact Sheet for Patients: EntrepreneurPulse.com.au  Fact Sheet for Healthcare Providers: IncredibleEmployment.be  This test is not yet approved or cleared by the [place] FDA and has been authorized for detection and/or diagnosis of SARS-CoV-2 by FDA under an Emergency Use Authorization (EUA). This EUA will remain in effect (meaning this test can be used) for the duration of the COVID-19 declaration under Section 564(b)(1) of the Act, 21 U.S.C. section 360bbb-3(b)(1), unless the authorization is terminated or revoked.  Performed at [facility], [place] [address] [place], [place] [address]      Labs: BNP (last 3 results) Recent Labs    10/09/20 0037  BNP 119.1*   Basic Metabolic Panel: Recent Labs  Lab 10/09/20 0037 10/10/20 0201  NA 141 138  K 4.8 3.5   CL 104 106  CO2 29 24  GLUCOSE 117* 104*  BUN 11 7  CREATININE 1.19* 0.91  CALCIUM 9.0 8.5*   Liver Function Tests: Recent Labs  Lab 10/09/20 0037  AST 25  ALT 13  ALKPHOS 72  BILITOT 0.2*  PROT 6.4*  ALBUMIN 3.3*   Recent Labs  Lab 10/09/20 0037  LIPASE 18   No results for input(s): AMMONIA in the last 168 hours. CBC: Recent Labs  Lab 10/09/20 0037 10/10/20 0201  WBC 6.7 5.5  NEUTROABS 1.6*  --   HGB 13.9 12.0  HCT 38.1 32.2*  MCV 82.3 80.9  PLT 212 170   Cardiac Enzymes: No results for input(s): CKTOTAL, CKMB, CKMBINDEX, TROPONINI in the last 168 hours. BNP: Invalid input(s): POCBNP CBG: No results for input(s): GLUCAP in the last 168 hours. D-Dimer No results for input(s): DDIMER in the last 72 hours. Hgb A1c Recent Labs    10/09/20 1329  HGBA1C 5.6   Lipid Profile Recent Labs    10/09/20 1329  CHOL 233*  HDL 56  LDLCALC 157*  TRIG 99  CHOLHDL 4.2   Thyroid function studies No results for input(s): TSH, T4TOTAL, T3FREE, THYROIDAB in the last 72 hours.  Invalid input(s): FREET3 Anemia  work up No results for input(s): VITAMINB12, FOLATE, FERRITIN, TIBC, IRON, RETICCTPCT in the last 72 hours. Urinalysis    Component Value Date/Time   COLORURINE YELLOW 06/27/2018 1630   APPEARANCEUR CLEAR 06/27/2018 1630   LABSPEC 1.024 06/27/2018 1630   PHURINE 6.0 06/27/2018 1630   GLUCOSEU NEGATIVE 06/27/2018 1630   HGBUR SMALL (A) 06/27/2018 1630   BILIRUBINUR NEGATIVE 06/27/2018 1630   KETONESUR NEGATIVE 06/27/2018 1630   PROTEINUR 100 (A) 06/27/2018 1630   NITRITE NEGATIVE 06/27/2018 1630   LEUKOCYTESUR NEGATIVE 06/27/2018 1630   Sepsis Labs Invalid input(s): PROCALCITONIN,  WBC,  LACTICIDVEN Microbiology Recent Results (from the past 240 hour(s))  Resp Panel by RT-PCR (Flu A&B, Covid) Nasopharyngeal Swab     Status: None   Collection Time: 10/09/20  1:54 AM   Specimen: Nasopharyngeal Swab; Nasopharyngeal(NP) swabs in vial transport medium   Result Value Ref Range Status   SARS Coronavirus 2 by RT PCR NEGATIVE NEGATIVE Final    Comment: (NOTE) SARS-CoV-2 target nucleic acids are NOT DETECTED.  The SARS-CoV-2 RNA is generally detectable in upper respiratory specimens during the acute phase of infection. The lowest concentration of SARS-CoV-2 viral copies this assay can detect is 138 copies/mL. A negative result does not preclude SARS-Cov-2 infection and should not be used as the sole basis for treatment or other patient management decisions. A negative result may occur with  improper specimen collection/handling, submission of specimen other than nasopharyngeal swab, presence of viral mutation(s) within the areas targeted by this assay, and inadequate number of viral copies(<138 copies/mL). A negative result must be combined with clinical observations, patient history, and epidemiological information. The expected result is Negative.  Fact Sheet for Patients:  EntrepreneurPulse.com.au  Fact Sheet for Healthcare Providers:  IncredibleEmployment.be  This test is no t yet approved or cleared by the [place] FDA and  has been authorized for detection and/or diagnosis of SARS-CoV-2 by FDA under an Emergency Use Authorization (EUA). This EUA will remain  in effect (meaning this test can be used) for the duration of the COVID-19 declaration under Section 564(b)(1) of the Act, 21 U.S.C.section 360bbb-3(b)(1), unless the authorization is terminated  or revoked sooner.       Influenza A by PCR NEGATIVE NEGATIVE Final   Influenza B by PCR NEGATIVE NEGATIVE Final    Comment: (NOTE) The Xpert Xpress SARS-CoV-2/FLU/RSV plus assay is intended as an aid in the diagnosis of influenza from Nasopharyngeal swab specimens and should not be used as a sole basis for treatment. Nasal washings and aspirates are unacceptable for Xpert Xpress SARS-CoV-2/FLU/RSV testing.  Fact Sheet for  Patients: EntrepreneurPulse.com.au  Fact Sheet for Healthcare Providers: IncredibleEmployment.be  This test is not yet approved or cleared by the [place] FDA and has been authorized for detection and/or diagnosis of SARS-CoV-2 by FDA under an Emergency Use Authorization (EUA). This EUA will remain in effect (meaning this test can be used) for the duration of the COVID-19 declaration under Section 564(b)(1) of the Act, 21 U.S.C. section 360bbb-3(b)(1), unless the authorization is terminated or revoked.  Performed at [facility], [place] [address], [place], [place] [address]      SIGNED:   Charlynne Cousins, MD  [facility] 10/11/2020, 8:03 AM Pager   If 7PM-7AM, please contact night-coverage [email or web address] Password [email or web address]

## 2020-10-11 NOTE — TOC Transition Note (Signed)
Transition of Care (TOC) - CM/SW Discharge Note Marvetta Gibbons RN, BSN Transitions of Care Unit 4E- RN Case Manager See Treatment Team for direct phone #  Weekend cross coverage  Patient Details  Name: Leslie Walker MRN: [id number] Date of Birth: 08-20-1965  Transition of Care [facility]) CM/SW Contact:  Leslie Patricia, RN Phone Number: 10/11/2020, 1:16 PM   Clinical Narrative:    Pt stable for transition home, notified by bedside RN pt going home on new Brilinta- CM spoke with pt at bedside- 30 day free card provided to pt to use for Brilinta needs on discharge- pt plans to go to [place] and script to be provided, also reviewed other meds with pt and she will need to also pick up Cozaar.  Pt is un-insured and discussed pt assistance with pt for on going Brilinta needs- pt assistance application provided and pt to f/u on application on her f/u - stress to pt importance of not running out of Brilinta and calling Cards office if she needs help while waiting on application to process.   Family to provide transport home, no further needs noted.    Final next level of care: Home/Self Care Barriers to Discharge: No Barriers Identified   Patient Goals and CMS Choice Patient states their goals for this hospitalization and ongoing recovery are:: return home   Choice offered to / list presented to : NA  Discharge Placement                 Home       Discharge Plan and Services   Discharge Planning Services: CM Consult, Medication Assistance Post Acute Care Choice: NA          DME Arranged: N/A DME Agency: NA       HH Arranged: NA HH Agency: NA        Social Determinants of Health (SDOH) Interventions     Readmission Risk Interventions No flowsheet data found.

## 2020-10-12 ENCOUNTER — Encounter (HOSPITAL_COMMUNITY): Payer: Self-pay | Admitting: Interventional Cardiology

## 2020-10-15 ENCOUNTER — Telehealth (HOSPITAL_COMMUNITY): Payer: Self-pay

## 2020-10-15 NOTE — Telephone Encounter (Signed)
Called patient to see if she is interested in the Cardiac Rehab Program. Patient expressed interest in virtual cardiac rehab only. Explained scheduling process, patient verbalized understanding. Will contact patient for scheduling once f/u has been completed.

## 2020-10-19 ENCOUNTER — Ambulatory Visit (INDEPENDENT_AMBULATORY_CARE_PROVIDER_SITE_OTHER): Payer: Self-pay

## 2020-10-19 DIAGNOSIS — I639 Cerebral infarction, unspecified: Secondary | ICD-10-CM

## 2020-10-19 LAB — CUP PACEART REMOTE DEVICE CHECK
Date Time Interrogation Session: 20220924000727
Implantable Pulse Generator Implant Date: 20200610

## 2020-10-20 NOTE — Progress Notes (Signed)
Cardiology Clinic Note   Patient Name: Leslie Walker Date of Encounter: 10/23/2020  Primary Care Provider:  Elbert Ewings, [place] Primary Cardiologist:  Pixie Casino, MD  Patient Profile    Leslie Walker 55 year old female presents the clinic today for follow-up of her NSTEMI and chronic systolic CHF.  Past Medical History    Past Medical History:  Diagnosis Date   Cocaine abuse ([place])    Dyslipidemia    Hypertension    Noncompliance w/medication treatment due to intermit use of medication    Stroke [facility]) 2021   no deficits   Past Surgical History:  Procedure Laterality Date   CORONARY STENT INTERVENTION N/A 10/09/2020   Procedure: CORONARY STENT INTERVENTION;  Surgeon: Belva Crome, MD;  Location: [place] [facility];  Service: Cardiovascular;  Laterality: N/A;   INTRAVASCULAR PRESSURE WIRE/FFR STUDY N/A 10/09/2020   Procedure: INTRAVASCULAR PRESSURE WIRE/FFR STUDY;  Surgeon: Belva Crome, MD;  Location: [place] [facility];  Service: Cardiovascular;  Laterality: N/A;   LEFT HEART CATH AND CORONARY ANGIOGRAPHY N/A 10/09/2020   Procedure: LEFT HEART CATH AND CORONARY ANGIOGRAPHY;  Surgeon: Belva Crome, MD;  Location: [place] [facility];  Service: Cardiovascular;  Laterality: N/A;   LOOP RECORDER INSERTION N/A 06/27/2018   Procedure: LOOP RECORDER INSERTION;  Surgeon: Constance Haw, MD;  Location: [place] [facility];  Service: Cardiovascular;  Laterality: N/A;    Allergies  No Known Allergies  History of Present Illness    Leslie Walker has a PMH of uncontrolled hypertension, TIA, NSTEMI, chronic systolic CHF, cellulitis right foot, heroin dependence, polysubstance abuse, HLD, tobacco dependence, chronic pain syndrome, cocaine use, and precordial chest pain.  She underwent cardiac catheterization 10/09/2020 which showed 95-99% mid RCA lesion, intermediate stenosis of mid LAD with a RFR of 0.92, 70-80% mid-distal circumflex followed by 2 small obtuse marginal  branches, left main widely patent, acute on chronic systolic CHF 16-10% which was felt to be multifactorial due to uncontrolled hypertension, substance abuse, and ischemia.  She received PCI with DES to her mid RCA.  Plan was made to continue aspirin and Brilinta x1 month then switch to Plavix and aspirin after 30 days.  She presents the clinic today for follow-up evaluation states she feels well.  She has slowly been increasing her physical activity.  We reviewed the importance of heart healthy low-sodium high-fiber diet.  We reviewed her angiography and she expressed understanding.  We reviewed her medications which she is tolerating well and without side effects.  She brings in the [employer] patient assistance form.  We started feeling the forms out today.  She will complete the rest of the form and bring it back to the office.  I will give her the salty 6 diet sheet, give her a letter for return to work without cardiac restrictions, stop her losartan and start her on valsartan 40 mg daily, and plan follow-up for 1 month.  We will have her return for BMP in 1 week.  Today she denies chest pain, shortness of breath, lower extremity edema, fatigue, palpitations, melena, hematuria, hemoptysis, diaphoresis, weakness, presyncope, syncope, orthopnea, and PND.   Home Medications    Prior to Admission medications   Medication Sig Start Date End Date Taking? Authorizing Provider  aspirin 81 MG EC tablet Take 1 tablet (81 mg total) by mouth daily. 10/11/20   Charlynne Cousins, MD  atorvastatin (LIPITOR) 40 MG tablet Take 1 tablet (40 mg total) by mouth daily at 6 PM.  Patient not taking: Reported on 10/09/2020 06/30/18   Georgette Shell, MD  furosemide (LASIX) 20 MG tablet Take 20 mg by mouth daily. 06/27/20   [provider]  gabapentin (NEURONTIN) 100 MG capsule Take 100 mg by mouth 3 (three) times daily as needed (nerve pain). 04/22/20   [provider]  hydrOXYzine (ATARAX/VISTARIL)  25 MG tablet Take 1 tablet (25 mg total) by mouth every 6 (six) hours as needed for anxiety. Patient not taking: Reported on 10/09/2020 06/30/18   Georgette Shell, MD  losartan (COZAAR) 50 MG tablet Take 1 tablet (50 mg total) by mouth daily. 10/11/20   Charlynne Cousins, MD  metoprolol tartrate (LOPRESSOR) 50 MG tablet Take 1 tablet (50 mg total) by mouth 2 (two) times daily. 10/11/20   Charlynne Cousins, MD  ticagrelor (BRILINTA) 90 MG TABS tablet Take 1 tablet (90 mg total) by mouth 2 (two) times daily. 10/11/20   Charlynne Cousins, MD    Family History    Family History  Problem Relation Age of Onset   Hypertension Mother    Hypertension Father    Hypertension Brother    Stroke Sister    She indicated that her mother is deceased. She indicated that her father is deceased. She indicated that her sister is alive. She indicated that her brother is deceased.  Social History    Social History   Socioeconomic History   Marital status: Single    Spouse name: Not on file   Number of children: 3   Years of education: Not on file   Highest education level: 12th grade  Occupational History   Not on file  Tobacco Use   Smoking status: Every Day    Types: Cigars   Smokeless tobacco: Never  Vaping Use   Vaping Use: Never used  Substance and Sexual Activity   Alcohol use: Yes    Comment: social drinking   Drug use: Yes    Types: Marijuana, Cocaine    Comment: 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   Sexual activity: Not Currently    Birth control/protection: Post-menopausal  Other Topics Concern   Not on file  Social History Narrative   Not on file   Social Determinants of Health   Financial Resource Strain: Not on file  Food Insecurity: Not on file  Transportation Needs: No Transportation Needs   Lack of Transportation (Medical): No   Lack of Transportation (Non-Medical): No  Physical Activity: Not on file  Stress: Not on file  Social Connections: Not on file   Intimate Partner Violence: Not on file     Review of Systems    General:  No chills, fever, night sweats or weight changes.  Cardiovascular:  No chest pain, dyspnea on exertion, edema, orthopnea, palpitations, paroxysmal nocturnal dyspnea. Dermatological: No rash, lesions/masses Respiratory: No cough, dyspnea Urologic: No hematuria, dysuria Abdominal:   No nausea, vomiting, diarrhea, bright red blood per rectum, melena, or hematemesis Neurologic:  No visual changes, wkns, changes in mental status. All other systems reviewed and are otherwise negative except as noted above.  Physical Exam    VS:  BP (!) 152/88 (BP Location: Right Arm, Patient Position: Sitting, Cuff Size: Normal)   Pulse 64   Ht 5\' 4"  (1.626 m)   Wt 156 lb (70.8 kg)   LMP  (LMP Unknown) Comment: Pt unsure why she no longer gets her period  BMI 26.78 kg/m  , BMI Body mass index is 26.78 kg/m. GEN: Well  nourished, well developed, in no acute distress. HEENT: normal. Neck: Supple, no JVD, carotid bruits, or masses. Cardiac: RRR, no murmurs, rubs, or gallops. No clubbing, cyanosis, edema.  Radials/DP/PT 2+ and equal bilaterally.  Respiratory:  Respirations regular and unlabored, clear to auscultation bilaterally. GI: Soft, nontender, nondistended, BS + x 4. MS: no deformity or atrophy. Skin: warm and dry, no rash. Neuro:  Strength and sensation are intact. Psych: Normal affect.  Accessory Clinical Findings    Recent Labs: 10/09/2020: ALT 13; B Natriuretic Peptide 304.5 10/10/2020: BUN 7; Creatinine, Ser 0.91; Hemoglobin 12.0; Platelets 170; Potassium 3.5; Sodium 138   Recent Lipid Panel    Component Value Date/Time   CHOL 233 (H) 10/09/2020 1329   TRIG 99 10/09/2020 1329   HDL 56 10/09/2020 1329   CHOLHDL 4.2 10/09/2020 1329   VLDL 20 10/09/2020 1329   LDLCALC 157 (H) 10/09/2020 1329    ECG personally reviewed by me today-normal sinus rhythm possible left atrial enlargement left ventricular hypertrophy  64 bpm- No acute changes  Echocardiogram   Cardiac catheterization 10/09/2020 CONCLUSIONS: 95-99% mid RCA treated with overlapping drug-eluting stents postdilated to 3.5 mm in diameter.  TIMI grade III flow was maintained pre and post stenting. Intermediate stenosis in the mid LAD [place] 111 stenosis estimated EF 50% and demonstrated to be hemodynamically insignificant with an RFR of 0.92. 70 to 80% mid to distal circumflex followed by 2 small obtuse marginal branches.  Medical therapy recommended. Left main is widely patent Acute on chronic systolic heart failure with EF 30 to 35%, LVEDP 20 mmHg.  Etiology is likely multifactorial (uncontrolled hypertension/substance abuse/ischemia). Recommendation:   Dual antiplatelet therapy for 12 months.  First month should be aspirin and Brilinta.  Okay to switch to Plavix and aspirin after 30 days. Guideline directed therapy for heart failure.  Metoprolol and losartan have been prescribed.  Metoprolol should be switched to the succinate preparation. Control of blood pressure.  IV nitroglycerin is temporarily being used while metoprolol and losartan are being initiated.  Patient ordinarily takes Catapres but it is unclear how frequent this occurs. Aggressive secondary prevention.  This includes avoidance of substance use/tobacco/medication noncompliance.  Diagnostic Dominance: Right Intervention    Assessment & Plan   1.  NSTEMI-denies recurrence of chest discomfort since being discharged from the hospital.  Underwent cardiac catheterization with PCI and DES 10/09/2020.  She received PCI with DES to her mid RCA.  Started filling out [employer] patient assistance paperwork today. Continue aspirin, Brilinta, atorvastatin, losartan, metoprolol Heart healthy low-sodium diet-salty 6 given Increase physical activity as tolerated  Newly diagnosed systolic CHF/ischemic cardiomyopathy-euvolemic today.  No increased DOE or activity intolerance.   Echocardiogram showed EF 45%.  Plan to continue to titrate guideline directed medical therapy and repeat echocardiogram after medications been optimized. Continue losartan, metoprolol Heart healthy low-sodium diet-salty 6 given Increase physical activity as tolerated Stop losartan Start valsartan 40 mg daily Repeat BMP in 1 week  Hyperlipidemia-10/09/2020: Cholesterol 233; HDL 56; LDL Cholesterol 157; Triglycerides 99; VLDL 20 Continue atorvastatin, aspirin Heart healthy low-sodium high-fiber diet Increase physical activity as tolerated  Uncontrolled hypertension-BP today 152/88.  Well-controlled at home.  Reports compliance with medications. Continue , metoprolol Start Valsartan Heart healthy low-sodium diet-salty 6 given Increase physical activity as tolerated Maintain blood pressure log  Polysubstance abuse-UDS during admission positive for cocaine, opiates, and marijuana. Counseling provided  Right upper lobe nodule-noted to be 3 mm right upper lobe nodule on CT scan. Repeat CT scan in 12 months.  Disposition: Follow-up with me or Dr. Debara Pickett in 1 month.  Jossie Ng. David Rodriquez NP-C    10/23/2020, 3:14 PM [place] [address] Office [phone] Fax [phone]  Notice: This dictation was prepared with Dragon dictation along with smaller phrase technology. Any transcriptional errors that result from this process are unintentional and may not be corrected upon review.  I spent 14 minutes examining this patient, reviewing medications, and using patient centered shared decision making involving her cardiac care.  Prior to her visit I spent greater than 20 minutes reviewing her past medical history,  medications, and prior cardiac tests.

## 2020-10-23 ENCOUNTER — Encounter: Payer: Self-pay | Admitting: General Practice

## 2020-10-23 ENCOUNTER — Other Ambulatory Visit: Payer: Self-pay

## 2020-10-23 ENCOUNTER — Ambulatory Visit (INDEPENDENT_AMBULATORY_CARE_PROVIDER_SITE_OTHER): Payer: Self-pay | Admitting: General Practice

## 2020-10-23 VITALS — BP 152/88 | HR 64 | Ht 64.0 in | Wt 156.0 lb

## 2020-10-23 DIAGNOSIS — F1910 Other psychoactive substance abuse, uncomplicated: Secondary | ICD-10-CM

## 2020-10-23 DIAGNOSIS — I5022 Chronic systolic (congestive) heart failure: Secondary | ICD-10-CM

## 2020-10-23 DIAGNOSIS — I214 Non-ST elevation (NSTEMI) myocardial infarction: Secondary | ICD-10-CM

## 2020-10-23 DIAGNOSIS — R911 Solitary pulmonary nodule: Secondary | ICD-10-CM

## 2020-10-23 DIAGNOSIS — Z79899 Other long term (current) drug therapy: Secondary | ICD-10-CM

## 2020-10-23 DIAGNOSIS — I10 Essential (primary) hypertension: Secondary | ICD-10-CM

## 2020-10-23 DIAGNOSIS — E782 Mixed hyperlipidemia: Secondary | ICD-10-CM

## 2020-10-23 DIAGNOSIS — I255 Ischemic cardiomyopathy: Secondary | ICD-10-CM

## 2020-10-23 MED ORDER — VALSARTAN 40 MG PO TABS: 40.0000 mg | ORAL_TABLET | Freq: Every day | ORAL | 0 refills | Status: DC

## 2020-10-23 NOTE — Patient Instructions (Signed)
Medication Instructions:  STOP LOSARTAN  START VALSARTAN 40MG   *If you need a refill on your cardiac medications before your next appointment, please call your pharmacy*  Lab Work:    BMET IN 1 WEEK-THIS IS NOT FASTING      Special Instructions PLEASE READ AND FOLLOW SALTY 6-ATTACHED-1,800mg  daily  PLEASE INCREASE PHYSICAL ACTIVITY AS TOLERATED  TAKE AND LOG YOUR BLOOD PRESSURE AT LEAST 1 HOUR AFTER TAKING MEDICATION AND BRING BACK TO FOLLOW UP APPOINTMENT  Follow-Up: Your next appointment:  1 month(s) In Person with K. Mali Hilty, MD OR IF UNAVAILABLE [place], FNP-C   At [facility], you and your health needs are our priority.  As part of our continuing mission to provide you with exceptional heart care, we have created designated Provider Care Teams.  These Care Teams include your primary Cardiologist (physician) and Advanced Practice Providers (APPs -  Physician Assistants and Nurse Practitioners) who all work together to provide you with the care you need, when you need it.  We recommend signing up for the patient portal called "MyChart".  Sign up information is provided on this After Visit Summary.  MyChart is used to connect with patients for Virtual Visits (Telemedicine).  Patients are able to view lab/test results, encounter notes, upcoming appointments, etc.  Non-urgent messages can be sent to your provider as well.   To learn more about what you can do with MyChart, go to [email or web address].              6 SALTY THINGS TO AVOID     1,800MG  DAILY

## 2020-10-27 NOTE — Progress Notes (Signed)
Carelink Summary Report / Loop Recorder 

## 2020-11-10 ENCOUNTER — Telehealth: Payer: Self-pay | Admitting: Physician Assistant

## 2020-11-10 DIAGNOSIS — I214 Non-ST elevation (NSTEMI) myocardial infarction: Secondary | ICD-10-CM

## 2020-11-10 NOTE — Telephone Encounter (Signed)
Pt called stating she can't afford brilinta. However, she has not tried to use the coupon card. She is coming to [facility] to get samples tomorrow and a new coupon card.   If she has trouble, may need to switch to plavix with 300 mg load.   I will send this to pharmD and triage.   Ledora Bottcher, PA-C 11/10/2020, 6:29 PM [place], [place] [address]

## 2020-11-11 ENCOUNTER — Telehealth: Payer: Self-pay | Admitting: Physician Assistant

## 2020-11-11 ENCOUNTER — Other Ambulatory Visit: Payer: Self-pay | Admitting: Physician Assistant

## 2020-11-11 ENCOUNTER — Encounter: Payer: Self-pay | Admitting: Pharmacist

## 2020-11-11 DIAGNOSIS — I214 Non-ST elevation (NSTEMI) myocardial infarction: Secondary | ICD-10-CM

## 2020-11-11 MED ORDER — CLOPIDOGREL BISULFATE 75 MG PO TABS: 75.0000 mg | ORAL_TABLET | Freq: Every day | ORAL | 3 refills | Status: DC

## 2020-11-11 MED ORDER — TICAGRELOR 90 MG PO TABS
90.0000 mg | ORAL_TABLET | Freq: Two times a day (BID) | ORAL | 0 refills | Status: DC
Start: 2020-11-11 — End: 2020-11-18

## 2020-11-11 NOTE — Telephone Encounter (Signed)
Called and canceled Plavis prescriptions at pharmacy

## 2020-11-11 NOTE — Telephone Encounter (Signed)
Started PAP application for [place] for pt and noted where she needs to sign/complete her portion. Looks like she had started it on appt on 10/7 but then not turned it in? Hard to tell from notes. If she does get denied for that then she could have Plavix covered by [facility]. Copay cards are for commercially insured pts only.   Westley Hummer, MSW, [place]  [phone]

## 2020-11-11 NOTE — Telephone Encounter (Signed)
Patient came to pick up samples and had completed [place] and [place] paperwork.  Will fax off to manufacturer.  Gave sample medications.

## 2020-11-11 NOTE — Telephone Encounter (Signed)
Patient does not have active insurance.  Will likely need to be switched to Plavix after her samples run out.  Will route in [place] to help with medication costs.

## 2020-11-11 NOTE — Telephone Encounter (Signed)
I have been notified that she is unable to use the brilinta copay card. I have spoken with PharmD Gerald Stabs who will leave samples for her to pick up this afternoon.   Once samples are completed, she will switch to plavix with a 300 mg load. I have left instructions on her voicemail to come get samples and to ask to speak with one of the pharmacists today on how to use the plavix load and when to start.   I have also called the pharmacist at her [facility] to review instructions when she picks up the medication.

## 2020-11-11 NOTE — Addendum Note (Signed)
Addended by: Rollen Sox on: 11/11/2020 02:31 PM   Modules accepted: Orders

## 2020-11-12 ENCOUNTER — Telehealth: Payer: Self-pay | Admitting: Licensed Clinical Social Worker

## 2020-11-12 LAB — CUP PACEART REMOTE DEVICE CHECK
Date Time Interrogation Session: 20221027001355
Implantable Pulse Generator Implant Date: 20200610

## 2020-11-12 NOTE — Telephone Encounter (Signed)
Pt screened ineligible for Medicaid at this time, may be eligible for [employer], [employer] and CAFA. Called to discuss eligibility w/ pt- no answer, able to leave voicemail at [phone].  Westley Hummer, MSW, [place]  [phone]

## 2020-11-13 ENCOUNTER — Telehealth: Payer: Self-pay | Admitting: Licensed Clinical Social Worker

## 2020-11-13 NOTE — Progress Notes (Signed)
Heart and Vascular Care Navigation  11/13/2020  Leslie Walker 12/10/1965 [id number]  Reason for Referral:  Cannot afford medications, no insurance.  Engaged with patient by telephone for initial visit for Heart and Vascular Care Coordination.                                                                                                   Assessment:     LCSW reached out to pt this morning again via telephone. Was able to reach her at [phone]. Introduced self, role, reason for call. Pt confirms she is aware of samples and that she has returned her Patient Assistance Application for [place] and [place], pending f/u from that she will either continue Brilinta or change to Plavix per pharmacy. LCSW confirmed home address. She lives w/ her daughter who manages home expenses- denies any turn off notices at this time. She works part time (roughly 9/10-2) at [employer]. She confirms she is uninsured at this time and is not sure she has ever had insurance. I shared that there are several programs that she may be eligible for since it appears that she has been screened as ineligible at this time for Medicaid. Pt seems to not fully understand why she is not eligible for Medicaid but we did go over the guidelines together again. Pt interested in receiving applications and my card for additional guidance.   Currently she does have a vehicle but it intermittently gives her challenges so I have included the [employer]. She does not receive any additional income or benefits such as SNAP. When asked about her interest in applying she shares that she will just go herself if she needs something. I encouraged her to reach out if any additional questions/concerns arise after we speak and that I will f/u with her to ensure packet received.                                    HRT/VAS Care Coordination     Patients Home Cardiology Office [place] Team Social Worker   Social  Worker Name: Valeda Malm, [place] [facility] [phone]   Living arrangements for the past 2 months Single Family Home   Lives with: Adult Children   Patient Current Insurance Coverage Self-Pay   Patient Has Concern With [place] Yes   Patient Concerns With Medical Bills recent hospitalization, ongoing medical w/u needed   Medical Bill Referrals: mailed CAFA and [place]   Does Patient Have Prescription Coverage? No   Patient Prescription Assistance Programs [place] [facility]; Patient Assistance Programs    Medassist Medications mailed pt application   Home Assistive Devices/Equipment None   DME Agency NA   [facility] Agency NA       Social History:  SDOH Screenings   Alcohol Screen: Not on file  Depression (PHQ2-9): Not on file  Financial Resource Strain: High Risk   Difficulty of Paying Living Expenses: Hard  Food Insecurity: No Food Insecurity   Worried About Running Out of Food in the Last Year: Never true   Ran Out of Food in the Last Year: Never true  Housing: Low Risk    Last Housing Risk Score: 0  Physical Activity: Not on file  Social Connections: Not on file  Stress: Not on file  Tobacco Use: High Risk   Smoking Tobacco Use: Every Day   Smokeless Tobacco Use: Never   Passive Exposure: Not on file  Transportation Needs: No Transportation Needs   Lack of Transportation (Medical): No   Lack of Transportation (Non-Medical): No    SDOH Interventions: Financial Resources:  Sales promotion account executive Interventions: Development worker, community, Other (Comment) (mailed [employer], CAFA, Radiation protection practitioner; pt declined Chief Operating Officer) Occupational hygienist for [employer]  Food Insecurity:  Food Insecurity Interventions: Intervention Not Indicated, Patient Refused  Housing Insecurity:  Housing Interventions: Intervention Not Indicated  Transportation:   Transportation  Interventions: Financial planner, Other (Comment) (mailed pt information about transportation options in [place])    Other Care Navigation Interventions:     Provided Pharmacy assistance resources [place] Medassist, Patient Assistance Programs  Patient expressed Mental Health concerns No.   Follow-up plan:   LCSW has mailed the following: my card w/ number highlighted, Transportation packet, [employer]  packet, [employer] application and [employer] (pt previously had this coverage). Will f/u to ensure received and answer any additional questions.

## 2020-11-17 ENCOUNTER — Telehealth: Payer: Self-pay

## 2020-11-17 NOTE — Telephone Encounter (Signed)
I am surprised.  I gave her a bunch of Brilinta samples on the assumption she would be approved.  But yes, she will need to be switched to clopidogrel once her samples run out.

## 2020-11-17 NOTE — Telephone Encounter (Signed)
Her patient assistance application?  It was only submitted last week

## 2020-11-18 ENCOUNTER — Encounter (HOSPITAL_COMMUNITY): Payer: Self-pay

## 2020-11-18 NOTE — Telephone Encounter (Signed)
LM2CB 

## 2020-11-19 NOTE — Telephone Encounter (Signed)
Left message for pt daughter to call  ?

## 2020-11-19 NOTE — Telephone Encounter (Signed)
Kathleene is returning Michelle's call, she is requesting a callback to her daughter Dionca.

## 2020-11-20 ENCOUNTER — Telehealth: Payer: Self-pay | Admitting: Licensed Clinical Social Worker

## 2020-11-20 NOTE — Telephone Encounter (Signed)
Attempted to reach [place] as pt was denied per notes. Spent >20 minutes on hold with no answer. Likely pt was denied as AZ&Me has been making all patients apply for Medicaid and receive a denial letter before they will assist. Unfortunately, pt is not eligible per health system's financial counseling department. I have sent her assistance applications including [employer] which would cover clopidogrel if she does complete it/provide supporting documentation. Have attempted to reach her this morning, no answer.   Westley Hummer, MSW, [place]  [phone]

## 2020-11-20 NOTE — Telephone Encounter (Signed)
LCSW attempted to reach pt this morning for f/u regarding assistance applications mailed her way and to encourage her to bring them to upcoming appt next week at [facility]. No answer at [phone], voicemail left.   Westley Hummer, MSW, [place]  [phone]

## 2020-11-21 ENCOUNTER — Other Ambulatory Visit: Payer: Self-pay | Admitting: General Practice

## 2020-11-23 ENCOUNTER — Other Ambulatory Visit: Payer: Self-pay | Admitting: Obstetrics and Gynecology

## 2020-11-23 ENCOUNTER — Other Ambulatory Visit: Payer: Self-pay

## 2020-11-23 ENCOUNTER — Other Ambulatory Visit: Payer: Self-pay | Admitting: *Deleted

## 2020-11-23 ENCOUNTER — Ambulatory Visit (INDEPENDENT_AMBULATORY_CARE_PROVIDER_SITE_OTHER): Payer: No Typology Code available for payment source

## 2020-11-23 ENCOUNTER — Telehealth: Payer: Self-pay | Admitting: Licensed Clinical Social Worker

## 2020-11-23 DIAGNOSIS — I255 Ischemic cardiomyopathy: Secondary | ICD-10-CM

## 2020-11-23 DIAGNOSIS — Z1231 Encounter for screening mammogram for malignant neoplasm of breast: Secondary | ICD-10-CM

## 2020-11-23 MED ORDER — VALSARTAN 40 MG PO TABS
40.0000 mg | ORAL_TABLET | Freq: Every day | ORAL | 3 refills | Status: DC
  Filled 2020-11-23: qty 30, 30d supply, fill #0

## 2020-11-23 NOTE — Progress Notes (Deleted)
Cardiology Clinic Note   Patient Name: Leslie Walker Date of Encounter: 11/23/2020  Primary Care Provider:  Elbert Ewings, [place] Primary Cardiologist:  Pixie Casino, MD  Patient Profile    Leslie Walker 55 year old female presents the clinic today for follow-up of her NSTEMI and chronic systolic CHF.  Past Medical History    Past Medical History:  Diagnosis Date   Cocaine abuse ([place])    Dyslipidemia    Hypertension    Noncompliance w/medication treatment due to intermit use of medication    Stroke [facility]) 2021   no deficits   Past Surgical History:  Procedure Laterality Date   CORONARY STENT INTERVENTION N/A 10/09/2020   Procedure: CORONARY STENT INTERVENTION;  Surgeon: Belva Crome, MD;  Location: [facility];  Service: Cardiovascular;  Laterality: N/A;   INTRAVASCULAR PRESSURE WIRE/FFR STUDY N/A 10/09/2020   Procedure: INTRAVASCULAR PRESSURE WIRE/FFR STUDY;  Surgeon: Belva Crome, MD;  Location: [facility];  Service: Cardiovascular;  Laterality: N/A;   LEFT HEART CATH AND CORONARY ANGIOGRAPHY N/A 10/09/2020   Procedure: LEFT HEART CATH AND CORONARY ANGIOGRAPHY;  Surgeon: Belva Crome, MD;  Location: [facility];  Service: Cardiovascular;  Laterality: N/A;   LOOP RECORDER INSERTION N/A 06/27/2018   Procedure: LOOP RECORDER INSERTION;  Surgeon: Constance Haw, MD;  Location: [facility];  Service: Cardiovascular;  Laterality: N/A;    Allergies  No Known Allergies  History of Present Illness    Leslie Walker has a PMH of uncontrolled hypertension, TIA, NSTEMI, chronic systolic CHF, cellulitis right foot, heroin dependence, polysubstance abuse, HLD, tobacco dependence, chronic pain syndrome, cocaine use, and precordial chest pain.  She underwent cardiac catheterization 10/09/2020 which showed 95-99% mid RCA lesion, intermediate stenosis of mid LAD with a RFR of 0.92, 70-80% mid-distal circumflex followed by 2 small obtuse marginal  branches, left main widely patent, acute on chronic systolic CHF 46-96% which was felt to be multifactorial due to uncontrolled hypertension, substance abuse, and ischemia.  She received PCI with DES to her mid RCA.  Plan was made to continue aspirin and Brilinta x1 month then switch to Plavix and aspirin after 30 days.  She presented to  the clinic 10/23/2020 for follow-up evaluation stated she felt well.  She had slowly been increasing her physical activity.  We reviewed the importance of heart healthy low-sodium high-fiber diet.  We reviewed her angiography and she expressed understanding.  We reviewed her medications which she is tolerating well and without side effects.  She brought in the [employer] patient assistance form.  We started filling the forms out today.  She plan to complete the rest of the form and bring it back to the office.  I  gave her the salty 6 diet sheet, gave her a letter for return to work without cardiac restrictions, stopped her losartan and started her on valsartan 40 mg daily, and we planned  follow-up for 1 month.  We we plan to have her return for BMP in 1 week.  She presents to the clinic today for follow-up evaluation states***  Today she denies chest pain, shortness of breath, lower extremity edema, fatigue, palpitations, melena, hematuria, hemoptysis, diaphoresis, weakness, presyncope, syncope, orthopnea, and PND.   Home Medications    Prior to Admission medications   Medication Sig Start Date End Date Taking? Authorizing Provider  aspirin 81 MG EC tablet Take 1 tablet (81 mg total) by mouth daily. 10/11/20   Charlynne Cousins, MD  atorvastatin (LIPITOR) 40 MG tablet Take 1 tablet (40 mg total) by mouth daily at 6 PM. Patient not taking: Reported on 10/09/2020 06/30/18   Georgette Shell, MD  furosemide (LASIX) 20 MG tablet Take 20 mg by mouth daily. 06/27/20   [provider]  gabapentin (NEURONTIN) 100 MG capsule Take 100 mg by mouth 3 (three)  times daily as needed (nerve pain). 04/22/20   [provider]  hydrOXYzine (ATARAX/VISTARIL) 25 MG tablet Take 1 tablet (25 mg total) by mouth every 6 (six) hours as needed for anxiety. Patient not taking: Reported on 10/09/2020 06/30/18   Georgette Shell, MD  losartan (COZAAR) 50 MG tablet Take 1 tablet (50 mg total) by mouth daily. 10/11/20   Charlynne Cousins, MD  metoprolol tartrate (LOPRESSOR) 50 MG tablet Take 1 tablet (50 mg total) by mouth 2 (two) times daily. 10/11/20   Charlynne Cousins, MD  ticagrelor (BRILINTA) 90 MG TABS tablet Take 1 tablet (90 mg total) by mouth 2 (two) times daily. 10/11/20   Charlynne Cousins, MD    Family History    Family History  Problem Relation Age of Onset   Hypertension Mother    Hypertension Father    Hypertension Brother    Stroke Sister    She indicated that her mother is deceased. She indicated that her father is deceased. She indicated that her sister is alive. She indicated that her brother is deceased.  Social History    Social History   Socioeconomic History   Marital status: Single    Spouse name: Not on file   Number of children: 3   Years of education: Not on file   Highest education level: 12th grade  Occupational History   Not on file  Tobacco Use   Smoking status: Every Day    Types: Cigars   Smokeless tobacco: Never  Vaping Use   Vaping Use: Never used  Substance and Sexual Activity   Alcohol use: Yes    Comment: social drinking   Drug use: Yes    Types: Marijuana, Cocaine    Comment: 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   Sexual activity: Not Currently    Birth control/protection: Post-menopausal  Other Topics Concern   Not on file  Social History Narrative   Not on file   Social Determinants of Health   Financial Resource Strain: High Risk   Difficulty of Paying Living Expenses: Hard  Food Insecurity: No Food Insecurity   Worried About Running Out of Food in the Last Year: Never true   Ran  Out of Food in the Last Year: Never true  Transportation Needs: No Transportation Needs   Lack of Transportation (Medical): No   Lack of Transportation (Non-Medical): No  Physical Activity: Not on file  Stress: Not on file  Social Connections: Not on file  Intimate Partner Violence: Not on file     Review of Systems    General:  No chills, fever, night sweats or weight changes.  Cardiovascular:  No chest pain, dyspnea on exertion, edema, orthopnea, palpitations, paroxysmal nocturnal dyspnea. Dermatological: No rash, lesions/masses Respiratory: No cough, dyspnea Urologic: No hematuria, dysuria Abdominal:   No nausea, vomiting, diarrhea, bright red blood per rectum, melena, or hematemesis Neurologic:  No visual changes, wkns, changes in mental status. All other systems reviewed and are otherwise negative except as noted above.  Physical Exam    VS:  LMP  (LMP Unknown) Comment: Pt unsure why she no longer gets  her period , BMI There is no height or weight on file to calculate BMI. GEN: Well nourished, well developed, in no acute distress. HEENT: normal. Neck: Supple, no JVD, carotid bruits, or masses. Cardiac: RRR, no murmurs, rubs, or gallops. No clubbing, cyanosis, edema.  Radials/DP/PT 2+ and equal bilaterally.  Respiratory:  Respirations regular and unlabored, clear to auscultation bilaterally. GI: Soft, nontender, nondistended, BS + x 4. MS: no deformity or atrophy. Skin: warm and dry, no rash. Neuro:  Strength and sensation are intact. Psych: Normal affect.  Accessory Clinical Findings    Recent Labs: 10/09/2020: ALT 13; B Natriuretic Peptide 304.5 10/10/2020: BUN 7; Creatinine, Ser 0.91; Hemoglobin 12.0; Platelets 170; Potassium 3.5; Sodium 138   Recent Lipid Panel    Component Value Date/Time   CHOL 233 (H) 10/09/2020 1329   TRIG 99 10/09/2020 1329   HDL 56 10/09/2020 1329   CHOLHDL 4.2 10/09/2020 1329   VLDL 20 10/09/2020 1329   LDLCALC 157 (H) 10/09/2020 1329     ECG personally reviewed by me today-normal sinus rhythm possible left atrial enlargement left ventricular hypertrophy 64 bpm- No acute changes    Cardiac catheterization 10/09/2020  CONCLUSIONS: 95-99% mid RCA treated with overlapping drug-eluting stents postdilated to 3.5 mm in diameter.  TIMI grade III flow was maintained pre and post stenting. Intermediate stenosis in the mid LAD [place] 111 stenosis estimated EF 50% and demonstrated to be hemodynamically insignificant with an RFR of 0.92. 70 to 80% mid to distal circumflex followed by 2 small obtuse marginal branches.  Medical therapy recommended. Left main is widely patent Acute on chronic systolic heart failure with EF 30 to 35%, LVEDP 20 mmHg.  Etiology is likely multifactorial (uncontrolled hypertension/substance abuse/ischemia). Recommendation:   Dual antiplatelet therapy for 12 months.  First month should be aspirin and Brilinta.  Okay to switch to Plavix and aspirin after 30 days. Guideline directed therapy for heart failure.  Metoprolol and losartan have been prescribed.  Metoprolol should be switched to the succinate preparation. Control of blood pressure.  IV nitroglycerin is temporarily being used while metoprolol and losartan are being initiated.  Patient ordinarily takes Catapres but it is unclear how frequent this occurs. Aggressive secondary prevention.  This includes avoidance of substance use/tobacco/medication noncompliance.  Diagnostic Dominance: Right Intervention    Assessment & Plan   1.  NSTEMI-no chest pain today.    Underwent cardiac catheterization with PCI and DES 10/09/2020.  She received PCI with DES to her mid RCA.  Started filling out [employer] patient assistance paperwork today. Continue aspirin, Brilinta, atorvastatin,  metoprolol Heart healthy low-sodium diet-salty 6 given Increase physical activity as tolerated  Newly diagnosed systolic CHF/ischemic cardiomyopathy-euvolemic today.  No  increased DOE or activity intolerance.  Echocardiogram showed EF 45%.  Plan to continue to titrate guideline directed medical therapy and repeat echocardiogram after medications been optimized. Continue losartan, metoprolol, valsartan Start Entresto 49-51 Heart healthy low-sodium diet-salty 6 given Increase physical activity as tolerated BMP today and in 1 week Recommend repeat echocardiogram when heart failure medications been optimized.  Hyperlipidemia-10/09/2020: Cholesterol 233; HDL 56; LDL Cholesterol 157; Triglycerides 99; VLDL 20 Continue atorvastatin, aspirin Heart healthy low-sodium high-fiber diet Increase physical activity as tolerated  Uncontrolled hypertension-BP today ***152/88.  Continues to be well-controlled at home.   Continue , metoprolol Stop losartan Start Entresto Heart healthy low-sodium diet-salty 6 given Increase physical activity as tolerated Maintain blood pressure log  Polysubstance abuse-continues to abstain from street drugs. Congratulated on cessation   Disposition: Follow-up  with me or Dr. Debara Pickett in 1 month.  Jossie Ng. Lauralie Blacksher NP-C    11/23/2020, 6:40 AM [place] [address] Office [phone] Fax [phone]  Notice: This dictation was prepared with Dragon dictation along with smaller phrase technology. Any transcriptional errors that result from this process are unintentional and may not be corrected upon review.  I spent 14 minutes examining this patient, reviewing medications, and using patient centered shared decision making involving her cardiac care.  Prior to her visit I spent greater than 20 minutes reviewing her past medical history,  medications, and prior cardiac tests.

## 2020-11-23 NOTE — Progress Notes (Addendum)
Heart and Vascular Care Navigation  11/23/2020  Leslie Walker 1965/03/27 [id number]  Reason for Referral:    Engaged with patient face to face for follow up visit for Heart and Vascular Care Coordination.                                                                                                   Assessment:                  LCSW met with pt today in the office,  she brought with her the [facility] application and the [facility] application. She was missing several of the supporting documents so she plans to go home and see what she can collect and bring them back. I have held on to the applications and will f/u with her. Pt brought with her the [place] she has been approved for through 2023. She is unable to make her appt tomorrow as she only took today off so she will reschedule. I clarified that Elbert Ewings, FNP at [facility] is who she sees for her PCP. I did not note his name on any of the current medications so I will call the office and ensure he is her PCP home and will continue to see her as needed. Pt aware. Brings with her an empty valsartan bottle and is agreeable to it being sent to [facility] and is aware it will be ready today and she may have a small copay. Encouraged her to f/u with me if any additional questions/concerns.                     HRT/VAS Care Coordination     Patients Home Cardiology Office [place] Team Social Worker   Social Worker Name: Valeda Malm, [place] [phone]   Living arrangements for the past 2 months Single Family Home   Lives with: Adult Children   Patient Current Insurance Coverage [place] Card   Patient Has Concern With [place] Yes   Patient Concerns With Medical Bills ongoing medical care   Medical Bill Referrals: pt has orange card, working on [employer]   Does Patient Have Prescription Coverage? No   Patient Prescription Assistance Programs [place]  [facility]; Patient Assistance Programs   [place] [facility] Medications Pt working on completion   Home Assistive Devices/Equipment None   DME Agency NA   [place] History:                                                                             SDOH Screenings   Alcohol Screen: Not on file  Depression (PHQ2-9): Not on file  Financial Resource Strain: High Risk   Difficulty of Paying Living  Expenses: Hard  Food Insecurity: No Food Insecurity   Worried About Charity fundraiser in the Last Year: Never true   Arboriculturist in the Last Year: Never true  Housing: Low Risk    Last Housing Risk Score: 0  Physical Activity: Not on file  Social Connections: Not on file  Stress: Not on file  Tobacco Use: High Risk   Smoking Tobacco Use: Every Day   Smokeless Tobacco Use: Never   Passive Exposure: Not on file  Transportation Needs: No Transportation Needs   Lack of Transportation (Medical): No   Lack of Transportation (Non-Medical): No    SDOH Interventions: Financial Resources:  Financial Strain Interventions: Other (Comment) (met to work on [employer]  and [employer] applications) Occupational hygienist for [place] Interventions:     Provided Pharmacy assistance resources  Had medications sent to [facility] for fill, we worked on [employer] application   Follow-up plan:   Nurse Triage assisted w/ sending Valsartan to [employer], spoke with ADS RN who confirmed Elbert Ewings does see some patients as primary care provider including pt and can manage any non related cardiac questions/concerns. This was relayed to pt, I will f/u if I do not hear back from her about missing documents. Provided pt with a letter today to bring to work sharing that she came to work with me on paperwork.

## 2020-11-23 NOTE — Telephone Encounter (Signed)
LCSW received call from pt stating she is en route to the office for her appt and would like to meet with me to go over paperwork. LCSW shared that her office visit is scheduled for tomorrow at [facility]. Unfortunately, pt had taken today off work so she will need to reschedule her appt for tomorrow. Since she is already off today we plan to meet at 11:30am to review paperwork.   Westley Hummer, MSW, [place]  [phone]

## 2020-11-24 ENCOUNTER — Ambulatory Visit: Payer: Self-pay | Admitting: General Practice

## 2020-11-24 ENCOUNTER — Other Ambulatory Visit: Payer: Self-pay

## 2020-11-27 NOTE — Telephone Encounter (Signed)
Left message for pt daughter to call. Left message for pt to call

## 2020-12-01 NOTE — Progress Notes (Signed)
Carelink Summary Report / Loop Recorder 

## 2020-12-04 ENCOUNTER — Telehealth (HOSPITAL_COMMUNITY): Payer: Self-pay

## 2020-12-04 ENCOUNTER — Telehealth: Payer: Self-pay | Admitting: Licensed Clinical Social Worker

## 2020-12-04 NOTE — Telephone Encounter (Signed)
No response from pt in regards to Cardiac Rehab. Closed referral               

## 2020-12-04 NOTE — Telephone Encounter (Signed)
LCSW called to f/u with pt regarding missing documents for assistance applications.  No answer, left message to call back on voicemail at [phone].  Westley Hummer, MSW, [place]  [phone]

## 2020-12-04 NOTE — Telephone Encounter (Signed)
Patient should have a 300mg  (4 tablets) loading dose 12 hours after the last Brilinta dose and then take 75mg  daily

## 2020-12-07 NOTE — Telephone Encounter (Signed)
Left message for pt to call.

## 2020-12-15 ENCOUNTER — Telehealth: Payer: Self-pay | Admitting: Licensed Clinical Social Worker

## 2020-12-15 NOTE — Telephone Encounter (Signed)
[id number] DO NOT SEE BRILINTA ON MEDICATION LIST. PER PHARMD SHE DOES HAVE SAMPLES, SHE SHOULD FINISH THE BRILINTA THE AFTER 12 HOURS AFTER LAST BRILINTA DOSE TAKE 300mg  (4 tablets) loading dose 12 hours after the last Brilinta dose and then take 75mg  daily. Will have to call back again to notify of the medication change.

## 2020-12-15 NOTE — Telephone Encounter (Signed)
LCSW attempted to reach pt this morning at [phone]. No answer, left message to call back. Pt missing several documents to complete CAFA. Remain available.   Westley Hummer, MSW, [place]  [phone]- work cell phone (preferred) [phone]- desk phone

## 2020-12-16 ENCOUNTER — Telehealth: Payer: Self-pay | Admitting: Internal Medicine

## 2020-12-16 DIAGNOSIS — I214 Non-ST elevation (NSTEMI) myocardial infarction: Secondary | ICD-10-CM

## 2020-12-16 NOTE — Telephone Encounter (Signed)
Letter mailed to have pt call to discuss medication

## 2020-12-16 NOTE — Telephone Encounter (Signed)
Patient called stating she signed up for a program for Brilinta, she still hasn't received the medication. She only has a few pills left.

## 2020-12-16 NOTE — Telephone Encounter (Signed)
Left message to call back  See phone note 11/1-patient was switching to Plavix.

## 2020-12-18 MED ORDER — CLOPIDOGREL BISULFATE 75 MG PO TABS
ORAL_TABLET | ORAL | 0 refills | Status: DC
Start: 2020-12-18 — End: 2021-06-15

## 2020-12-21 ENCOUNTER — Telehealth: Payer: Self-pay | Admitting: Licensed Clinical Social Worker

## 2020-12-21 NOTE — Telephone Encounter (Signed)
LCSW attempted to reach pt at [phone], no answer, left voicemail to f/u on assistance applications and upcoming appointment.   Westley Hummer, MSW, [place]  [phone]- work cell phone (preferred) [phone]- desk phone

## 2020-12-22 NOTE — Progress Notes (Signed)
Cardiology Clinic Note   Patient Name: Leslie Walker Date of Encounter: 12/23/2020  Primary Care Provider:  Elbert Ewings, [place] Primary Cardiologist:  Pixie Casino, MD  Patient Profile    Leslie Walker 55 year old female presents the clinic today for follow-up of her NSTEMI and chronic systolic CHF.  Past Medical History    Past Medical History:  Diagnosis Date   Cocaine abuse ([place])    Dyslipidemia    Hypertension    Noncompliance w/medication treatment due to intermit use of medication    Stroke [facility]) 2021   no deficits   Past Surgical History:  Procedure Laterality Date   CORONARY STENT INTERVENTION N/A 10/09/2020   Procedure: CORONARY STENT INTERVENTION;  Surgeon: Belva Crome, MD;  Location: [facility];  Service: Cardiovascular;  Laterality: N/A;   INTRAVASCULAR PRESSURE WIRE/FFR STUDY N/A 10/09/2020   Procedure: INTRAVASCULAR PRESSURE WIRE/FFR STUDY;  Surgeon: Belva Crome, MD;  Location: [facility];  Service: Cardiovascular;  Laterality: N/A;   LEFT HEART CATH AND CORONARY ANGIOGRAPHY N/A 10/09/2020   Procedure: LEFT HEART CATH AND CORONARY ANGIOGRAPHY;  Surgeon: Belva Crome, MD;  Location: [facility];  Service: Cardiovascular;  Laterality: N/A;   LOOP RECORDER INSERTION N/A 06/27/2018   Procedure: LOOP RECORDER INSERTION;  Surgeon: Constance Haw, MD;  Location: [facility];  Service: Cardiovascular;  Laterality: N/A;    Allergies  No Known Allergies  History of Present Illness    Sofya Walker has a PMH of uncontrolled hypertension, TIA, NSTEMI, chronic systolic CHF, cellulitis right foot, heroin dependence, polysubstance abuse, HLD, tobacco dependence, chronic pain syndrome, cocaine use, and precordial chest pain.  She underwent cardiac catheterization 10/09/2020 which showed 95-99% mid RCA lesion, intermediate stenosis of mid LAD with a RFR of 0.92, 70-80% mid-distal circumflex followed by 2 small obtuse marginal  branches, left main widely patent, acute on chronic systolic CHF 60-10% which was felt to be multifactorial due to uncontrolled hypertension, substance abuse, and ischemia.  She received PCI with DES to her mid RCA.  Plan was made to continue aspirin and Brilinta x1 month then switch to Plavix and aspirin after 30 days.  She presented to  the clinic 10/23/2020 for follow-up evaluation stated she felt well.  She had slowly been increasing her physical activity.  We reviewed the importance of heart healthy low-sodium high-fiber diet.  We reviewed her angiography and she expressed understanding.  We reviewed her medications which she is tolerating well and without side effects.  She brought in the [employer] patient assistance form.  We started filling the forms out today.  She plan to complete the rest of the form and bring it back to the office.  I  gave her the salty 6 diet sheet, gave her a letter for return to work without cardiac restrictions, stopped her losartan and started her on valsartan 40 mg daily, and we planned  follow-up for 1 month.  We we plan to have her return for BMP in 1 week.  She presents to the clinic today for follow-up evaluation states she feels well.  She denies episodes of chest discomfort and shortness of breath.  She reports that she continues to refrain from recreational drugs.  She has been transitioned from Brilinta to Plavix.  She is tolerating all her medications well without side effects.  We reviewed her blood pressure and she reports that her blood pressure at home is in the 932-355 systolic range.  I  will increase her valsartan to 80 mg daily, give her the salty 6 diet sheet, have her increase her physical activity as tolerated, order a BMP today and in 1 week, and plan follow-up in 1-2 months.  I will also give her the [place] support stocking sheet.  Today she denies chest pain, shortness of breath, lower extremity edema, fatigue, palpitations, melena, hematuria,  hemoptysis, diaphoresis, weakness, presyncope, syncope, orthopnea, and PND.   Home Medications    Prior to Admission medications   Medication Sig Start Date End Date Taking? Authorizing Provider  aspirin 81 MG EC tablet Take 1 tablet (81 mg total) by mouth daily. 10/11/20   Charlynne Cousins, MD  atorvastatin (LIPITOR) 40 MG tablet Take 1 tablet (40 mg total) by mouth daily at 6 PM. Patient not taking: Reported on 10/09/2020 06/30/18   Georgette Shell, MD  furosemide (LASIX) 20 MG tablet Take 20 mg by mouth daily. 06/27/20   [provider]  gabapentin (NEURONTIN) 100 MG capsule Take 100 mg by mouth 3 (three) times daily as needed (nerve pain). 04/22/20   [provider]  hydrOXYzine (ATARAX/VISTARIL) 25 MG tablet Take 1 tablet (25 mg total) by mouth every 6 (six) hours as needed for anxiety. Patient not taking: Reported on 10/09/2020 06/30/18   Georgette Shell, MD  losartan (COZAAR) 50 MG tablet Take 1 tablet (50 mg total) by mouth daily. 10/11/20   Charlynne Cousins, MD  metoprolol tartrate (LOPRESSOR) 50 MG tablet Take 1 tablet (50 mg total) by mouth 2 (two) times daily. 10/11/20   Charlynne Cousins, MD  ticagrelor (BRILINTA) 90 MG TABS tablet Take 1 tablet (90 mg total) by mouth 2 (two) times daily. 10/11/20   Charlynne Cousins, MD    Family History    Family History  Problem Relation Age of Onset   Hypertension Mother    Hypertension Father    Hypertension Brother    Stroke Sister    She indicated that her mother is deceased. She indicated that her father is deceased. She indicated that her sister is alive. She indicated that her brother is deceased.  Social History    Social History   Socioeconomic History   Marital status: Single    Spouse name: Not on file   Number of children: 3   Years of education: Not on file   Highest education level: 12th grade  Occupational History   Not on file  Tobacco Use   Smoking status: Every Day    Types:  Cigars   Smokeless tobacco: Never  Vaping Use   Vaping Use: Never used  Substance and Sexual Activity   Alcohol use: Yes    Comment: social drinking   Drug use: Yes    Types: Marijuana, Cocaine    Comment: 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   Sexual activity: Not Currently    Birth control/protection: Post-menopausal  Other Topics Concern   Not on file  Social History Narrative   Not on file   Social Determinants of Health   Financial Resource Strain: High Risk   Difficulty of Paying Living Expenses: Hard  Food Insecurity: No Food Insecurity   Worried About Running Out of Food in the Last Year: Never true   Ran Out of Food in the Last Year: Never true  Transportation Needs: No Transportation Needs   Lack of Transportation (Medical): No   Lack of Transportation (Non-Medical): No  Physical Activity: Not on file  Stress: Not on file  Social Connections: Not on file  Intimate Partner Violence: Not on file     Review of Systems    General:  No chills, fever, night sweats or weight changes.  Cardiovascular:  No chest pain, dyspnea on exertion, edema, orthopnea, palpitations, paroxysmal nocturnal dyspnea. Dermatological: No rash, lesions/masses Respiratory: No cough, dyspnea Urologic: No hematuria, dysuria Abdominal:   No nausea, vomiting, diarrhea, bright red blood per rectum, melena, or hematemesis Neurologic:  No visual changes, wkns, changes in mental status. All other systems reviewed and are otherwise negative except as noted above.  Physical Exam    VS:  BP (!) 148/82   Pulse (!) 54   Ht 5\' 4"  (1.626 m)   Wt 166 lb 12.8 oz (75.7 kg)   LMP  (LMP Unknown) Comment: Pt unsure why she no longer gets her period  SpO2 98%   BMI 28.63 kg/m  , BMI Body mass index is 28.63 kg/m. GEN: Well nourished, well developed, in no acute distress. HEENT: normal. Neck: Supple, no JVD, carotid bruits, or masses. Cardiac: RRR, no murmurs, rubs, or gallops. No clubbing, cyanosis,  edema.  Radials/DP/PT 2+ and equal bilaterally.  Respiratory:  Respirations regular and unlabored, clear to auscultation bilaterally. GI: Soft, nontender, nondistended, BS + x 4. MS: no deformity or atrophy. Skin: warm and dry, no rash. Neuro:  Strength and sensation are intact. Psych: Normal affect.  Accessory Clinical Findings    Recent Labs: 10/09/2020: ALT 13; B Natriuretic Peptide 304.5 10/10/2020: BUN 7; Creatinine, Ser 0.91; Hemoglobin 12.0; Platelets 170; Potassium 3.5; Sodium 138   Recent Lipid Panel    Component Value Date/Time   CHOL 233 (H) 10/09/2020 1329   TRIG 99 10/09/2020 1329   HDL 56 10/09/2020 1329   CHOLHDL 4.2 10/09/2020 1329   VLDL 20 10/09/2020 1329   LDLCALC 157 (H) 10/09/2020 1329    ECG personally reviewed by me today-none today.   EKG 10/23/2020 normal sinus rhythm possible left atrial enlargement left ventricular hypertrophy 64 bpm- No acute changes    Cardiac catheterization 10/09/2020  CONCLUSIONS: 95-99% mid RCA treated with overlapping drug-eluting stents postdilated to 3.5 mm in diameter.  TIMI grade III flow was maintained pre and post stenting. Intermediate stenosis in the mid LAD [place] 111 stenosis estimated EF 50% and demonstrated to be hemodynamically insignificant with an RFR of 0.92. 70 to 80% mid to distal circumflex followed by 2 small obtuse marginal branches.  Medical therapy recommended. Left main is widely patent Acute on chronic systolic heart failure with EF 30 to 35%, LVEDP 20 mmHg.  Etiology is likely multifactorial (uncontrolled hypertension/substance abuse/ischemia). Recommendation:   Dual antiplatelet therapy for 12 months.  First month should be aspirin and Brilinta.  Okay to switch to Plavix and aspirin after 30 days. Guideline directed therapy for heart failure.  Metoprolol and losartan have been prescribed.  Metoprolol should be switched to the succinate preparation. Control of blood pressure.  IV nitroglycerin is  temporarily being used while metoprolol and losartan are being initiated.  Patient ordinarily takes Catapres but it is unclear how frequent this occurs. Aggressive secondary prevention.  This includes avoidance of substance use/tobacco/medication noncompliance.  Diagnostic Dominance: Right Intervention    Assessment & Plan   1. Newly diagnosed systolic CHF/ischemic cardiomyopathy-euvolemic today.  No increased DOE or activity intolerance.  Echocardiogram showed EF 45%.  Plan to continue to titrate guideline directed medical therapy and repeat echocardiogram after medications been optimized. Continue metoprolol Increase valsartan to 80 mg daily Heart healthy low-sodium  diet-salty 6 given Increase physical activity as tolerated Recommend repeat echocardiogram when heart failure medications been optimized. Repeat BMP today and in 1 week   NSTEMI-no chest pain today.    Underwent cardiac catheterization with PCI and DES 10/09/2020.  She received PCI with DES to her mid RCA.  Started filling out [employer] patient assistance paperwork today. Continue aspirin, Plavix, atorvastatin,  metoprolol Heart healthy low-sodium diet-salty 6 given Increase physical activity as tolerated  Hyperlipidemia-10/09/2020: Cholesterol 233; HDL 56; LDL Cholesterol 157; Triglycerides 99; VLDL 20 Continue atorvastatin, aspirin Heart healthy low-sodium high-fiber diet Increase physical activity as tolerated  Uncontrolled hypertension-BP today 148/82.  Well-controlled at home.   Continue  metoprolol,  Increase valsartan to 80 mg Heart healthy low-sodium diet-salty 6 given Increase physical activity as tolerated Maintain blood pressure log  Polysubstance abuse-denies recreational drug use.   Disposition: Follow-up with follow-up with Dr. Debara Pickett or me in 2 months.  Jossie Ng. Tequia Wolman NP-C    12/23/2020, 11:10 AM [place] [address] Office [phone] Fax [phone]  Notice: This dictation was prepared with Dragon dictation along with smaller phrase technology. Any transcriptional errors that result from this process are unintentional and may not be corrected upon review.  I spent 14 minutes examining this patient, reviewing medications, and using patient centered shared decision making involving her cardiac care.  Prior to her visit I spent greater than 20 minutes reviewing her past medical history,  medications, and prior cardiac tests.

## 2020-12-23 ENCOUNTER — Other Ambulatory Visit: Payer: Self-pay

## 2020-12-23 ENCOUNTER — Encounter: Payer: Self-pay | Admitting: General Practice

## 2020-12-23 ENCOUNTER — Ambulatory Visit (INDEPENDENT_AMBULATORY_CARE_PROVIDER_SITE_OTHER): Payer: No Typology Code available for payment source | Admitting: General Practice

## 2020-12-23 VITALS — BP 148/82 | HR 54 | Ht 64.0 in | Wt 166.8 lb

## 2020-12-23 DIAGNOSIS — I214 Non-ST elevation (NSTEMI) myocardial infarction: Secondary | ICD-10-CM

## 2020-12-23 DIAGNOSIS — Z79899 Other long term (current) drug therapy: Secondary | ICD-10-CM

## 2020-12-23 DIAGNOSIS — I5022 Chronic systolic (congestive) heart failure: Secondary | ICD-10-CM

## 2020-12-23 DIAGNOSIS — E782 Mixed hyperlipidemia: Secondary | ICD-10-CM

## 2020-12-23 DIAGNOSIS — I255 Ischemic cardiomyopathy: Secondary | ICD-10-CM

## 2020-12-23 DIAGNOSIS — I10 Essential (primary) hypertension: Secondary | ICD-10-CM

## 2020-12-23 DIAGNOSIS — F1910 Other psychoactive substance abuse, uncomplicated: Secondary | ICD-10-CM

## 2020-12-23 LAB — BASIC METABOLIC PANEL
BUN/Creatinine Ratio: 7 — ABNORMAL LOW (ref 9–23)
BUN: 7 mg/dL (ref 6–24)
CO2: 26 mmol/L (ref 20–29)
Calcium: 9 mg/dL (ref 8.7–10.2)
Chloride: 106 mmol/L (ref 96–106)
Creatinine, Ser: 0.95 mg/dL (ref 0.57–1.00)
Glucose: 116 mg/dL — ABNORMAL HIGH (ref 70–99)
Potassium: 4.7 mmol/L (ref 3.5–5.2)
Sodium: 143 mmol/L (ref 134–144)
eGFR: 70 mL/min/{1.73_m2} (ref >59)

## 2020-12-23 LAB — CUP PACEART REMOTE DEVICE CHECK
Date Time Interrogation Session: 20221128234432
Implantable Pulse Generator Implant Date: 20200610

## 2020-12-23 MED ORDER — VALSARTAN 80 MG PO TABS: 80.0000 mg | ORAL_TABLET | Freq: Every day | ORAL | 3 refills | Status: DC

## 2020-12-23 NOTE — Patient Instructions (Signed)
Medication Instructions:  INCREASE VALSARTAN 80MG  DAILY-YOU MAY TAKE 2 OF THE CURRENT PRESCRIPTION  *If you need a refill on your cardiac medications before your next appointment, please call your pharmacy*  Lab Work:    BMET TODAY AND IN 1 WEEK  Special Instructions PLEASE READ AND FOLLOW SALTY 6-ATTACHED-1,800mg  daily  PLEASE TAKE AND LOG YOUR BLOOD PRESSURE  PLEASE PURCHASE AND WEAR COMPRESSION STOCKINGS DAILY AND TAKE OFF AT BEDTIME. Compression stockings are elastic socks that squeeze the legs. They help to increase blood flow to the legs and to decrease swelling in the legs from fluid retention, and reduce the chance of developing blood clots in the lower legs. Please put on in the AM when dressing and off at night when dressing for bed.  LET THEM KNOW THAT YOU NEED KNEE HIGH'S WITH COMPRESSION OF 15-20 mmhg.  [employer]  [place] ([place] [phone]); [place], [place] [address]; [phone]  EMAIL   [email or web address].  PLEASE MAKE SURE TO ELEVATE YOUR FEET & LEGS WHILE SITTING, THIS WILL HELP WITH THE SWELLING ALSO.   Follow-Up: Your next appointment:  2 month(s) In Person with Leslie Casino, MD  or Leslie Memos, FNP     At [facility], you and your health needs are our priority.  As part of our continuing mission to provide you with exceptional heart care, we have created designated Provider Care Teams.  These Care Teams include your primary Cardiologist (physician) and Advanced Practice Providers (APPs -  Physician Assistants and Nurse Practitioners) who all work together to provide you with the care you need, when you need it.  We recommend signing up for the patient portal called "MyChart".  Sign up information is provided on this After Visit Summary.  MyChart is used to connect with patients for Virtual Visits (Telemedicine).  Patients are able to view lab/test results, encounter notes, upcoming appointments, etc.  Non-urgent messages can be sent to  your provider as well.   To learn more about what you can do with MyChart, go to [email or web address].              6 SALTY THINGS TO AVOID     1,800MG  DAILY

## 2020-12-24 NOTE — Progress Notes (Signed)
Attempted to call pt. No answer [id number]

## 2020-12-25 ENCOUNTER — Encounter (HOSPITAL_BASED_OUTPATIENT_CLINIC_OR_DEPARTMENT_OTHER): Payer: Self-pay

## 2020-12-28 ENCOUNTER — Ambulatory Visit (INDEPENDENT_AMBULATORY_CARE_PROVIDER_SITE_OTHER): Payer: No Typology Code available for payment source

## 2020-12-28 DIAGNOSIS — I255 Ischemic cardiomyopathy: Secondary | ICD-10-CM

## 2021-01-06 ENCOUNTER — Telehealth: Payer: Self-pay | Admitting: Licensed Clinical Social Worker

## 2021-01-06 NOTE — Telephone Encounter (Signed)
LCSW has not been able to reach pt over the past month in order to f/u on [employer] . Pt sent a text message to their phone [phone]) reminding her I am available for any additional assistance and which documents are missing.  I also have offered if she is not interested anymore in completing the application that I can place in shred bin. LCSW remains available    Westley Hummer, MSW, [place]  [phone]- work cell phone (preferred) [phone]- desk phone

## 2021-01-07 NOTE — Progress Notes (Signed)
Carelink Summary Report / Loop Recorder 

## 2021-01-25 ENCOUNTER — Telehealth: Payer: Self-pay | Admitting: Licensed Clinical Social Worker

## 2021-01-25 NOTE — Telephone Encounter (Signed)
Mailed pt her documents/started applications to her home address since we have had no contact over the past few months. Reminded her that I remain available as needed once all documents gathered.   Westley Hummer, MSW, [place]  [phone]- work cell phone (preferred) [phone]- desk phone

## 2021-02-01 ENCOUNTER — Ambulatory Visit (INDEPENDENT_AMBULATORY_CARE_PROVIDER_SITE_OTHER): Payer: No Typology Code available for payment source

## 2021-02-01 DIAGNOSIS — I255 Ischemic cardiomyopathy: Secondary | ICD-10-CM

## 2021-02-01 LAB — CUP PACEART REMOTE DEVICE CHECK
Date Time Interrogation Session: 20230115234052
Implantable Pulse Generator Implant Date: 20200610

## 2021-02-02 ENCOUNTER — Ambulatory Visit: Payer: Self-pay

## 2021-02-04 ENCOUNTER — Ambulatory Visit: Payer: Self-pay

## 2021-02-15 NOTE — Progress Notes (Signed)
Carelink Summary Report / Loop Recorder 

## 2021-03-01 NOTE — Progress Notes (Signed)
Cardiology Clinic Note   Patient Name: Leslie Walker Date of Encounter: 03/03/2021  Primary Care Provider:  Elbert Ewings, [place] Primary Cardiologist:  Pixie Casino, MD  Patient Profile    Leslie Walker 56 year old female presents the clinic today for follow-up of her coronary artery disease and chronic systolic CHF.  Past Medical History    Past Medical History:  Diagnosis Date   Cocaine abuse ([place])    Dyslipidemia    Hypertension    Noncompliance w/medication treatment due to intermit use of medication    Stroke [facility]) 2021   no deficits   Past Surgical History:  Procedure Laterality Date   CORONARY STENT INTERVENTION N/A 10/09/2020   Procedure: CORONARY STENT INTERVENTION;  Surgeon: Belva Crome, MD;  Location: [place] [facility];  Service: Cardiovascular;  Laterality: N/A;   INTRAVASCULAR PRESSURE WIRE/FFR STUDY N/A 10/09/2020   Procedure: INTRAVASCULAR PRESSURE WIRE/FFR STUDY;  Surgeon: Belva Crome, MD;  Location: [place] [facility];  Service: Cardiovascular;  Laterality: N/A;   LEFT HEART CATH AND CORONARY ANGIOGRAPHY N/A 10/09/2020   Procedure: LEFT HEART CATH AND CORONARY ANGIOGRAPHY;  Surgeon: Belva Crome, MD;  Location: [place] [facility];  Service: Cardiovascular;  Laterality: N/A;   LOOP RECORDER INSERTION N/A 06/27/2018   Procedure: LOOP RECORDER INSERTION;  Surgeon: Constance Haw, MD;  Location: [place] [facility];  Service: Cardiovascular;  Laterality: N/A;    Allergies  No Known Allergies  History of Present Illness    [name] has a PMH of uncontrolled hypertension, TIA, NSTEMI, chronic systolic CHF, cellulitis right foot, heroin dependence, polysubstance abuse, HLD, tobacco dependence, chronic pain syndrome, cocaine use, and precordial chest pain.  She underwent cardiac catheterization 10/09/2020 which showed 95-99% mid RCA lesion, intermediate stenosis of mid LAD with a RFR of 0.92, 70-80% mid-distal circumflex followed by 2 small  obtuse marginal branches, left main widely patent, acute on chronic systolic CHF 69-48% which was felt to be multifactorial due to uncontrolled hypertension, substance abuse, and ischemia.  She received PCI with DES to her mid RCA.  Plan was made to continue aspirin and Brilinta x1 month then switch to Plavix and aspirin after 30 days.  She presented to  the clinic 10/23/2020 for follow-up evaluation stated she felt well.  She had slowly been increasing her physical activity.  We reviewed the importance of heart healthy low-sodium high-fiber diet.  We reviewed her angiography and she expressed understanding.  We reviewed her medications which she is tolerating well and without side effects.  She brought in the [employer] patient assistance form.  We started filling the forms out today.  She plan to complete the rest of the form and bring it back to the office.  I  gave her the salty 6 diet sheet, gave her a letter for return to work without cardiac restrictions, stopped her losartan and started her on valsartan 40 mg daily, and we planned  follow-up for 1 month.  We we plan to have her return for BMP in 1 week.  She presented to the clinic 12/23/20 for follow-up evaluation stated she felt well.  She denied episodes of chest discomfort and shortness of breath.  She reported that she continued to refrain from recreational drugs.  She had been transitioned from Brilinta to Plavix.  She was tolerating all her medications well without side effects.  We reviewed her blood pressure and she reported that her blood pressure at home was in the 546-270 systolic range.  I increased her valsartan to 80 mg daily, gave her the salty 6 diet sheet, had her increase her physical activity as tolerated, ordered a BMP and again in 1 week, and planned follow-up in 1-2 months.  I will also give her the [place] support stocking sheet.  She presents to the clinic today for follow-up evaluation states she feels well.  She does report  that she did use some marijuana and alcohol over the holidays.  She reports medication compliance.  She does note the occasional elevated blood pressure at home in the 035 range systolic.  Her blood pressure is slightly elevated today at 138/82.  I will increase her valsartan medication plan to transition her to [facility].  I will order a BMP today and plan follow-up for 1 month.  Today she denies chest pain, shortness of breath, lower extremity edema, fatigue, palpitations, melena, hematuria, hemoptysis, diaphoresis, weakness, presyncope, syncope, orthopnea, and PND.   Home Medications    Prior to Admission medications   Medication Sig Start Date End Date Taking? Authorizing Provider  aspirin 81 MG EC tablet Take 1 tablet (81 mg total) by mouth daily. 10/11/20   Charlynne Cousins, MD  atorvastatin (LIPITOR) 40 MG tablet Take 1 tablet (40 mg total) by mouth daily at 6 PM. Patient not taking: Reported on 10/09/2020 06/30/18   Georgette Shell, MD  furosemide (LASIX) 20 MG tablet Take 20 mg by mouth daily. 06/27/20   [provider]  gabapentin (NEURONTIN) 100 MG capsule Take 100 mg by mouth 3 (three) times daily as needed (nerve pain). 04/22/20   [provider]  hydrOXYzine (ATARAX/VISTARIL) 25 MG tablet Take 1 tablet (25 mg total) by mouth every 6 (six) hours as needed for anxiety. Patient not taking: Reported on 10/09/2020 06/30/18   Georgette Shell, MD  losartan (COZAAR) 50 MG tablet Take 1 tablet (50 mg total) by mouth daily. 10/11/20   Charlynne Cousins, MD  metoprolol tartrate (LOPRESSOR) 50 MG tablet Take 1 tablet (50 mg total) by mouth 2 (two) times daily. 10/11/20   Charlynne Cousins, MD  ticagrelor (BRILINTA) 90 MG TABS tablet Take 1 tablet (90 mg total) by mouth 2 (two) times daily. 10/11/20   Charlynne Cousins, MD    Family History    Family History  Problem Relation Age of Onset   Hypertension Mother    Hypertension Father    Hypertension Brother     Stroke Sister    She indicated that her mother is deceased. She indicated that her father is deceased. She indicated that her sister is alive. She indicated that her brother is deceased.   Social History    Social History   Socioeconomic History   Marital status: Single    Spouse name: Not on file   Number of children: 3   Years of education: Not on file   Highest education level: 12th grade  Occupational History   Not on file  Tobacco Use   Smoking status: Every Day    Types: Cigars   Smokeless tobacco: Never  Vaping Use   Vaping Use: Never used  Substance and Sexual Activity   Alcohol use: Yes    Comment: social drinking   Drug use: Yes    Types: Marijuana, Cocaine    Comment: 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   Sexual activity: Not Currently    Birth control/protection: Post-menopausal  Other Topics Concern   Not on file  Social History Narrative  Not on file   Social Determinants of Health   Financial Resource Strain: High Risk   Difficulty of Paying Living Expenses: Hard  Food Insecurity: No Food Insecurity   Worried About Running Out of Food in the Last Year: Never true   Ran Out of Food in the Last Year: Never true  Transportation Needs: No Transportation Needs   Lack of Transportation (Medical): No   Lack of Transportation (Non-Medical): No  Physical Activity: Not on file  Stress: Not on file  Social Connections: Not on file  Intimate Partner Violence: Not on file     Review of Systems    General:  No chills, fever, night sweats or weight changes.  Cardiovascular:  No chest pain, dyspnea on exertion, edema, orthopnea, palpitations, paroxysmal nocturnal dyspnea. Dermatological: No rash, lesions/masses Respiratory: No cough, dyspnea Urologic: No hematuria, dysuria Abdominal:   No nausea, vomiting, diarrhea, bright red blood per rectum, melena, or hematemesis Neurologic:  No visual changes, wkns, changes in mental status. All other systems reviewed  and are otherwise negative except as noted above.  Physical Exam    VS:  BP 138/82 (BP Location: Left Arm, Patient Position: Sitting, Cuff Size: Normal)    Pulse 68    Ht 5\' 4"  (1.626 m)    Wt 160 lb 3.2 oz (72.7 kg)    LMP  (LMP Unknown) Comment: Pt unsure why she no longer gets her period   BMI 27.50 kg/m  , BMI Body mass index is 27.5 kg/m. GEN: Well nourished, well developed, in no acute distress. HEENT: normal. Neck: Supple, no JVD, carotid bruits, or masses. Cardiac: RRR, no murmurs, rubs, or gallops. No clubbing, cyanosis, edema.  Radials/DP/PT 2+ and equal bilaterally.  Respiratory:  Respirations regular and unlabored, clear to auscultation bilaterally. GI: Soft, nontender, nondistended, BS + x 4. MS: no deformity or atrophy. Skin: warm and dry, no rash. Neuro:  Strength and sensation are intact. Psych: Normal affect.  Accessory Clinical Findings    Recent Labs: 10/09/2020: ALT 13; B Natriuretic Peptide 304.5 10/10/2020: Hemoglobin 12.0; Platelets 170 12/23/2020: BUN 7; Creatinine, Ser 0.95; Potassium 4.7; Sodium 143   Recent Lipid Panel    Component Value Date/Time   CHOL 233 (H) 10/09/2020 1329   TRIG 99 10/09/2020 1329   HDL 56 10/09/2020 1329   CHOLHDL 4.2 10/09/2020 1329   VLDL 20 10/09/2020 1329   LDLCALC 157 (H) 10/09/2020 1329    ECG personally reviewed by me today-sinus bradycardia LVH 57 bpm no ST or T wave deviation.   EKG 10/23/2020 normal sinus rhythm possible left atrial enlargement left ventricular hypertrophy 64 bpm- No acute changes    Cardiac catheterization 10/09/2020  CONCLUSIONS: 95-99% mid RCA treated with overlapping drug-eluting stents postdilated to 3.5 mm in diameter.  TIMI grade III flow was maintained pre and post stenting. Intermediate stenosis in the mid LAD [place] 111 stenosis estimated EF 50% and demonstrated to be hemodynamically insignificant with an RFR of 0.92. 70 to 80% mid to distal circumflex followed by 2 small obtuse  marginal branches.  Medical therapy recommended. Left main is widely patent Acute on chronic systolic heart failure with EF 30 to 35%, LVEDP 20 mmHg.  Etiology is likely multifactorial (uncontrolled hypertension/substance abuse/ischemia). Recommendation:   Dual antiplatelet therapy for 12 months.  First month should be aspirin and Brilinta.  Okay to switch to Plavix and aspirin after 30 days. Guideline directed therapy for heart failure.  Metoprolol and losartan have been prescribed.  Metoprolol should be  switched to the succinate preparation. Control of blood pressure.  IV nitroglycerin is temporarily being used while metoprolol and losartan are being initiated.  Patient ordinarily takes Catapres but it is unclear how frequent this occurs. Aggressive secondary prevention.  This includes avoidance of substance use/tobacco/medication noncompliance.  Diagnostic Dominance: Right Intervention    Assessment & Plan   1. Systolic CHF/ischemic cardiomyopathy-continues to be euvolemic .  No increased DOE or activity intolerance.  Echocardiogram showed EF 45%.  Plan to continue to titrate guideline directed medical therapy and repeat echocardiogram after medications been optimized. Continue metoprolol Increase valsartan to 160 mg daily Heart healthy low-sodium diet-salty 6 given Increase physical activity as tolerated Recommend repeat echocardiogram when heart failure medications been optimized.  Repeat BMP once labs are resulted we will plan to transition to [facility]  Coronary artery disease-denies recent episodes of arm neck back or chest discomfort..    Underwent cardiac catheterization with PCI and DES 10/09/2020.  She received PCI with DES to her mid RCA.  Started filling out [employer] patient assistance paperwork today. Continue aspirin, Plavix, atorvastatin,  metoprolol Heart healthy low-sodium diet-salty 6 given Increase physical activity as tolerated  Hyperlipidemia-10/09/2020:  Cholesterol 233; HDL 56; LDL Cholesterol 157; Triglycerides 99; VLDL 20 Continue atorvastatin, aspirin Heart healthy low-sodium high-fiber diet Increase physical activity as tolerated Repeat fasting lipids and LFTs  Uncontrolled hypertension-BP today 138/82.  Reports elevated blood pressure at home sometimes in the 389 range systolic.   Continue  metoprolol,  Increase valsartan to 160 mg Heart healthy low-sodium diet-salty 6 given Increase physical activity as tolerated Maintain blood pressure log  Polysubstance abuse-reports smoking marijuana and using alcohol over the holidays.  She has not used any substances since that time.   Disposition: Follow-up with follow-up with Dr. Debara Pickett or me in 1-2 months.  Jossie Ng. Mirren Gest NP-C    03/03/2021, 11:02 AM [place] [address] Office [phone] Fax [phone]  Notice: This dictation was prepared with Dragon dictation along with smaller phrase technology. Any transcriptional errors that result from this process are unintentional and may not be corrected upon review.  I spent 13 minutes examining this patient, reviewing medications, and using patient centered shared decision making involving her cardiac care.  Prior to her visit I spent greater than 20 minutes reviewing her past medical history,  medications, and prior cardiac tests.

## 2021-03-03 ENCOUNTER — Encounter: Payer: Self-pay | Admitting: General Practice

## 2021-03-03 ENCOUNTER — Other Ambulatory Visit: Payer: Self-pay

## 2021-03-03 ENCOUNTER — Ambulatory Visit (INDEPENDENT_AMBULATORY_CARE_PROVIDER_SITE_OTHER): Payer: No Typology Code available for payment source | Admitting: General Practice

## 2021-03-03 VITALS — BP 138/82 | HR 68 | Ht 64.0 in | Wt 160.2 lb

## 2021-03-03 DIAGNOSIS — E782 Mixed hyperlipidemia: Secondary | ICD-10-CM

## 2021-03-03 DIAGNOSIS — F1910 Other psychoactive substance abuse, uncomplicated: Secondary | ICD-10-CM

## 2021-03-03 DIAGNOSIS — I255 Ischemic cardiomyopathy: Secondary | ICD-10-CM

## 2021-03-03 DIAGNOSIS — I5022 Chronic systolic (congestive) heart failure: Secondary | ICD-10-CM

## 2021-03-03 DIAGNOSIS — I10 Essential (primary) hypertension: Secondary | ICD-10-CM

## 2021-03-03 DIAGNOSIS — Z79899 Other long term (current) drug therapy: Secondary | ICD-10-CM

## 2021-03-03 DIAGNOSIS — I2510 Atherosclerotic heart disease of native coronary artery without angina pectoris: Secondary | ICD-10-CM

## 2021-03-03 LAB — BASIC METABOLIC PANEL
BUN/Creatinine Ratio: 16 (ref 9–23)
BUN: 17 mg/dL (ref 6–24)
CO2: 25 mmol/L (ref 20–29)
Calcium: 9.5 mg/dL (ref 8.7–10.2)
Chloride: 103 mmol/L (ref 96–106)
Creatinine, Ser: 1.07 mg/dL — ABNORMAL HIGH (ref 0.57–1.00)
Glucose: 109 mg/dL — ABNORMAL HIGH (ref 70–99)
Potassium: 4.4 mmol/L (ref 3.5–5.2)
Sodium: 141 mmol/L (ref 134–144)
eGFR: 61 mL/min/{1.73_m2} (ref >59)

## 2021-03-03 LAB — HEPATIC FUNCTION PANEL
ALT: 14 IU/L (ref 0–32)
AST: 20 IU/L (ref 0–40)
Albumin: 4.1 g/dL (ref 3.8–4.9)
Alkaline Phosphatase: 87 IU/L (ref 44–121)
Bilirubin Total: 0.7 mg/dL (ref 0.0–1.2)
Bilirubin, Direct: 0.2 mg/dL (ref 0.00–0.40)
Total Protein: 6.9 g/dL (ref 6.0–8.5)

## 2021-03-03 LAB — LIPID PANEL
Chol/HDL Ratio: 2.6 ratio (ref 0.0–4.4)
Cholesterol, Total: 156 mg/dL (ref 100–199)
HDL: 60 mg/dL (ref >39)
LDL Chol Calc (NIH): 80 mg/dL (ref 0–99)
Triglycerides: 83 mg/dL (ref 0–149)
VLDL Cholesterol Cal: 16 mg/dL (ref 5–40)

## 2021-03-03 MED ORDER — VALSARTAN 160 MG PO TABS: 160.0000 mg | ORAL_TABLET | Freq: Every day | ORAL | 3 refills | Status: DC

## 2021-03-03 NOTE — Patient Instructions (Addendum)
Medication Instructions:  INCREASE VALSARTAN 160MG  DAILY, YOU MAY MAKE 2 OF WHAT YOU HAVE(80MG ) UNTIL THEY ARE GONE.  *If you need a refill on your cardiac medications before your next appointment, please call your pharmacy*  Lab Work:   BMET, LIPID AND LFT TODAY      Special Instructions PLEASE READ AND FOLLOW SALTY 6-ATTACHED-1,800mg  daily  PLEASE INCREASE PHYSICAL ACTIVITY AS TOLERATED   TAKE AND LOG YOUR BLOOD PRESSURE DAILY-AT LEAST 1 HOUR AFTER TAKING YOUR MEDICATION  PLEASE AVOID RECREATIONAL DRUG USE  Follow-Up: Your next appointment:  1 month(s) In Person with Pixie Casino, MD  or Coletta Memos, FNP    At [facility], you and your health needs are our priority.  As part of our continuing mission to provide you with exceptional heart care, we have created designated Provider Care Teams.  These Care Teams include your primary Cardiologist (physician) and Advanced Practice Providers (APPs -  Physician Assistants and Nurse Practitioners) who all work together to provide you with the care you need, when you need it.  We recommend signing up for the patient portal called "MyChart".  Sign up information is provided on this After Visit Summary.  MyChart is used to connect with patients for Virtual Visits (Telemedicine).  Patients are able to view lab/test results, encounter notes, upcoming appointments, etc.  Non-urgent messages can be sent to your provider as well.   To learn more about what you can do with MyChart, go to [email or web address].              6 SALTY THINGS TO AVOID     1,800MG  DAILY

## 2021-03-04 ENCOUNTER — Other Ambulatory Visit: Payer: Self-pay

## 2021-03-04 ENCOUNTER — Ambulatory Visit: Payer: Self-pay | Admitting: *Deleted

## 2021-03-04 ENCOUNTER — Ambulatory Visit
Admission: RE | Admit: 2021-03-04 | Discharge: 2021-03-04 | Disposition: A | Discharge status: Home / Self Care | Payer: No Typology Code available for payment source | Source: Ambulatory Visit | Attending: Obstetrics and Gynecology | Admitting: Obstetrics and Gynecology

## 2021-03-04 VITALS — BP 180/130 | Wt 161.8 lb

## 2021-03-04 DIAGNOSIS — I10 Essential (primary) hypertension: Secondary | ICD-10-CM

## 2021-03-04 DIAGNOSIS — Z1239 Encounter for other screening for malignant neoplasm of breast: Secondary | ICD-10-CM

## 2021-03-04 DIAGNOSIS — Z79899 Other long term (current) drug therapy: Secondary | ICD-10-CM

## 2021-03-04 DIAGNOSIS — Z1211 Encounter for screening for malignant neoplasm of colon: Secondary | ICD-10-CM

## 2021-03-04 DIAGNOSIS — E782 Mixed hyperlipidemia: Secondary | ICD-10-CM

## 2021-03-04 DIAGNOSIS — Z1231 Encounter for screening mammogram for malignant neoplasm of breast: Secondary | ICD-10-CM

## 2021-03-04 MED ORDER — SACUBITRIL-VALSARTAN 49-51 MG PO TABS: 1.0000 | ORAL_TABLET | Freq: Two times a day (BID) | ORAL | 3 refills | Status: DC

## 2021-03-04 MED ORDER — EZETIMIBE 10 MG PO TABS: 10.0000 mg | ORAL_TABLET | Freq: Every day | ORAL | 6 refills | Status: DC

## 2021-03-04 NOTE — Progress Notes (Signed)
[name] is a 56 y.o. female who presents to [facility] clinic today with no complaints.    Pap Smear: Pap smear not completed today. Last Pap smear was 11/21/2019 at [facility] clinic and was normal with negative HPV. Per patient has no history of an abnormal Pap smear. Last Pap smear result is available in Epic.   Physical exam: Breasts Breasts symmetrical. No skin abnormalities bilateral breasts. No nipple retraction bilateral breasts. No nipple discharge bilateral breasts. No lymphadenopathy. No lumps palpated bilateral breasts.  No complaints of pain or tenderness on exam. Palpated patients LOOP Recorder on exam. No complaints of pain or tenderness on exam.      MS DIGITAL SCREENING TOMO BILATERAL  Result Date: 11/24/2019 CLINICAL DATA:  Screening. EXAM: DIGITAL SCREENING BILATERAL MAMMOGRAM WITH TOMO AND CAD COMPARISON:  Previous exam(s). ACR Breast Density Category c: The breast tissue is heterogeneously dense, which may obscure small masses. FINDINGS: In the left breast, a possible mass warrants further evaluation. In the right breast, no findings suspicious for malignancy. Images were processed with CAD. IMPRESSION: Further evaluation is suggested for a possible mass in the left breast. RECOMMENDATION: Diagnostic mammogram and possibly ultrasound of the left breast. (Code:FI-L-65M) The patient will be contacted regarding the findings, and additional imaging will be scheduled. BI-RADS CATEGORY  0: Incomplete. Need additional imaging evaluation and/or prior mammograms for comparison. Electronically Signed   By: Dorise Bullion III M.D   On: 11/24/2019 11:26   MS DIGITAL SCREENING TOMO BILATERAL  Result Date: 09/04/2018 CLINICAL DATA:  Screening. EXAM: DIGITAL SCREENING BILATERAL MAMMOGRAM WITH TOMO AND CAD COMPARISON:  Previous exam(s). ACR Breast Density Category b: There are scattered areas of fibroglandular density. FINDINGS: There are no findings suspicious for malignancy. Implantable cardiac  recording device projects over the inner left breast. Images were processed with CAD. IMPRESSION: No mammographic evidence of malignancy. A result letter of this screening mammogram will be mailed directly to the patient. RECOMMENDATION: Screening mammogram in one year. (Code:SM-B-01Y) BI-RADS CATEGORY  1: Negative. Electronically Signed   By: Evangeline Dakin M.D.   On: 09/04/2018 15:47   MS DIGITAL SCREENING TOMO BILATERAL  Result Date: 02/22/2017 CLINICAL DATA:  Screening. EXAM: 2D DIGITAL SCREENING BILATERAL MAMMOGRAM WITH 3D TOMO WITH CAD COMPARISON:  Previous exam(s). ACR Breast Density Category b: There are scattered areas of fibroglandular density. FINDINGS: There are no findings suspicious for malignancy. Images were processed with CAD. IMPRESSION: No mammographic evidence of malignancy. A result letter of this screening mammogram will be mailed directly to the patient. RECOMMENDATION: Screening mammogram in one year. (Code:SM-B-01Y) BI-RADS CATEGORY  1: Negative. Electronically Signed   By: Lajean Manes M.D.   On: 02/22/2017 07:45   MS DIGITAL DIAG TOMO UNI LEFT  Result Date: 01/23/2020 CLINICAL DATA:  56 year old female presenting as a recall from screening for possible left breast mass. EXAM: DIGITAL DIAGNOSTIC LEFT MAMMOGRAM WITH TOMO ULTRASOUND LEFT BREAST COMPARISON:  Previous exam(s). ACR Breast Density Category c: The breast tissue is heterogeneously dense, which may obscure small masses. FINDINGS: Mammogram: Spot compression tomosynthesis views of the left breast performed demonstrating persistence of an oval mass in the upper inner left breast measuring approximately 0.7 cm. No additional new findings on today's imaging. Ultrasound: Targeted ultrasound is performed in the left breast at 11 o'clock 2 cm from the nipple demonstrating a cluster of anechoic masses, consistent with a cluster of cysts. Overall this measures 0.7 x 0.3 x 0.6 cm and corresponds to the mammographic finding.  IMPRESSION: Left  breast mass at 11 o'clock is consistent with a benign cluster of cysts. RECOMMENDATION: Screening mammogram in one year.(Code:SM-B-01Y) I have discussed the findings and recommendations with the patient. If applicable, a reminder letter will be sent to the patient regarding the next appointment. BI-RADS CATEGORY  2: Benign. Electronically Signed   By: Audie Pinto M.D.   On: 01/23/2020 13:45    Pelvic/Bimanual Pap is not indicated today per BCCCP guidelines.   Smoking History: Patient is a current smoker. Discussed smoking cessation with patient. Referred patient to the [facility] Quitline.   Patient Navigation: Patient education provided. Access to services provided for patient through [place] program.   Colorectal Cancer Screening: Per patient has never had colonoscopy completed. FIT Test given to patient to complete. No complaints today.    Breast and Cervical Cancer Risk Assessment: Patient does not have family history of breast cancer, known genetic mutations, or radiation treatment to the chest before age 77. Patient does not have history of cervical dysplasia, immunocompromised, or DES exposure in-utero.  Risk Assessment     Risk Scores       03/04/2021 11/21/2019   Last edited by: Royston Bake, CMA Royston Bake, CMA   5-year risk: 1.4 % 1.4 %   Lifetime risk: 7.7 % 7.8 %           A: BCCCP exam without pap smear No complaints.  P: Referred patient to the [facility] for a screening mammogram on the mobile unit. Appointment scheduled Thursday, March 04, 2021 at 1130.  Loletta Parish, RN 03/04/2021 10:57 AM

## 2021-03-04 NOTE — Patient Instructions (Signed)
Explained breast self awareness with Leslie Walker. Patient did not need a Pap smear today due to last Pap smear and HPV typing was 11/21/2019. Let her know BCCCP will cover Pap smears and HPV typing every 5 years unless has a history of abnormal Pap smears. Referred patient to the [facility] for a screening mammogram on the mobile unit. Appointment scheduled Thursday, March 04, 2021 at 1130. Patient aware of appointment and will be there. Let patient know the [facility] will follow up with her within the next couple weeks with results of her mammogram by letter or phone. Leslie Walker verbalized understanding.  Leslie Walker, Arvil Chaco, RN 10:56 AM

## 2021-03-04 NOTE — Addendum Note (Signed)
Addended by: Waylan Rocher on: 03/04/2021 08:27 AM   Modules accepted: Orders

## 2021-03-05 ENCOUNTER — Ambulatory Visit (INDEPENDENT_AMBULATORY_CARE_PROVIDER_SITE_OTHER): Payer: No Typology Code available for payment source

## 2021-03-05 DIAGNOSIS — I255 Ischemic cardiomyopathy: Secondary | ICD-10-CM

## 2021-03-07 LAB — CUP PACEART REMOTE DEVICE CHECK
Date Time Interrogation Session: 20230217234309
Implantable Pulse Generator Implant Date: 20200610

## 2021-03-11 NOTE — Progress Notes (Signed)
Carelink Summary Report / Loop Recorder 

## 2021-03-16 ENCOUNTER — Other Ambulatory Visit: Payer: Self-pay

## 2021-03-16 DIAGNOSIS — E782 Mixed hyperlipidemia: Secondary | ICD-10-CM

## 2021-03-16 DIAGNOSIS — Z79899 Other long term (current) drug therapy: Secondary | ICD-10-CM

## 2021-03-16 MED ORDER — EZETIMIBE 10 MG PO TABS: 10.0000 mg | ORAL_TABLET | Freq: Every day | ORAL | 6 refills | Status: DC

## 2021-03-24 NOTE — Progress Notes (Unsigned)
Cardiology Clinic Note   Patient Name: Leslie Walker Date of Encounter: 03/24/2021  Primary Care Provider:  Elbert Ewings, [place] Primary Cardiologist:  Pixie Casino, MD  Patient Profile    Leslie Walker 56 year old female presents the clinic today for follow-up of her coronary artery disease and chronic systolic CHF.  Past Medical History    Past Medical History:  Diagnosis Date   Cocaine abuse ([place])    Dyslipidemia    Hypertension    Noncompliance w/medication treatment due to intermit use of medication    Stroke [facility]) 2021   no deficits   Past Surgical History:  Procedure Laterality Date   CORONARY STENT INTERVENTION N/A 10/09/2020   Procedure: CORONARY STENT INTERVENTION;  Surgeon: Belva Crome, MD;  Location: [place] [facility];  Service: Cardiovascular;  Laterality: N/A;   INTRAVASCULAR PRESSURE WIRE/FFR STUDY N/A 10/09/2020   Procedure: INTRAVASCULAR PRESSURE WIRE/FFR STUDY;  Surgeon: Belva Crome, MD;  Location: [place] [facility];  Service: Cardiovascular;  Laterality: N/A;   LEFT HEART CATH AND CORONARY ANGIOGRAPHY N/A 10/09/2020   Procedure: LEFT HEART CATH AND CORONARY ANGIOGRAPHY;  Surgeon: Belva Crome, MD;  Location: [place] [facility];  Service: Cardiovascular;  Laterality: N/A;   LOOP RECORDER INSERTION N/A 06/27/2018   Procedure: LOOP RECORDER INSERTION;  Surgeon: Constance Haw, MD;  Location: [place] [facility];  Service: Cardiovascular;  Laterality: N/A;    Allergies  No Known Allergies  History of Present Illness    [name] has a PMH of uncontrolled hypertension, TIA, NSTEMI, chronic systolic CHF, cellulitis right foot, heroin dependence, polysubstance abuse, HLD, tobacco dependence, chronic pain syndrome, cocaine use, and precordial chest pain.  She underwent cardiac catheterization 10/09/2020 which showed 95-99% mid RCA lesion, intermediate stenosis of mid LAD with a RFR of 0.92, 70-80% mid-distal circumflex followed by 2 small  obtuse marginal branches, left main widely patent, acute on chronic systolic CHF 27-06% which was felt to be multifactorial due to uncontrolled hypertension, substance abuse, and ischemia.  She received PCI with DES to her mid RCA.  Plan was made to continue aspirin and Brilinta x1 month then switch to Plavix and aspirin after 30 days.  She presented to  the clinic 10/23/2020 for follow-up evaluation stated she felt well.  She had slowly been increasing her physical activity.  We reviewed the importance of heart healthy low-sodium high-fiber diet.  We reviewed her angiography and she expressed understanding.  We reviewed her medications which she is tolerating well and without side effects.  She brought in the [employer] patient assistance form.  We started filling the forms out today.  She plan to complete the rest of the form and bring it back to the office.  I  gave her the salty 6 diet sheet, gave her a letter for return to work without cardiac restrictions, stopped her losartan and started her on valsartan 40 mg daily, and we planned  follow-up for 1 month.  We we plan to have her return for BMP in 1 week.  She presented to the clinic 12/23/20 for follow-up evaluation stated she felt well.  She denied episodes of chest discomfort and shortness of breath.  She reported that she continued to refrain from recreational drugs.  She had been transitioned from Brilinta to Plavix.  She was tolerating all her medications well without side effects.  We reviewed her blood pressure and she reported that her blood pressure at home was in the 237-628 systolic range.  I increased her valsartan to 80 mg daily, gave her the salty 6 diet sheet, had her increase her physical activity as tolerated, ordered a BMP and again in 1 week, and planned follow-up in 1-2 months.  I will also give her the Pump Back support stocking sheet.  She presented to the clinic 03/03/21 for follow-up evaluation stated she felt well.  She did report  that she did use some marijuana and alcohol over the holidays.  She reported medication compliance.  She did note the occasional elevated blood pressure at home in the 510 range systolic.  Her blood pressure was slightly elevated during her visit- 138/82.  I  increased her valsartan medication planned to transition her to [facility].  I  ordered a BMP  and plan follow-up for 1 month.  Her BMP showed slightly elevated creatinine.  Her valsartan was stopped and she was started on Entresto 4951.  She presents to the clinic today for follow-up evaluation states***  Today she denies chest pain, shortness of breath, lower extremity edema, fatigue, palpitations, melena, hematuria, hemoptysis, diaphoresis, weakness, presyncope, syncope, orthopnea, and PND.   Home Medications    Prior to Admission medications   Medication Sig Start Date End Date Taking? Authorizing Provider  aspirin 81 MG EC tablet Take 1 tablet (81 mg total) by mouth daily. 10/11/20   Charlynne Cousins, MD  atorvastatin (LIPITOR) 40 MG tablet Take 1 tablet (40 mg total) by mouth daily at 6 PM. Patient not taking: Reported on 10/09/2020 06/30/18   Georgette Shell, MD  furosemide (LASIX) 20 MG tablet Take 20 mg by mouth daily. 06/27/20   [provider]  gabapentin (NEURONTIN) 100 MG capsule Take 100 mg by mouth 3 (three) times daily as needed (nerve pain). 04/22/20   [provider]  hydrOXYzine (ATARAX/VISTARIL) 25 MG tablet Take 1 tablet (25 mg total) by mouth every 6 (six) hours as needed for anxiety. Patient not taking: Reported on 10/09/2020 06/30/18   Georgette Shell, MD  losartan (COZAAR) 50 MG tablet Take 1 tablet (50 mg total) by mouth daily. 10/11/20   Charlynne Cousins, MD  metoprolol tartrate (LOPRESSOR) 50 MG tablet Take 1 tablet (50 mg total) by mouth 2 (two) times daily. 10/11/20   Charlynne Cousins, MD  ticagrelor (BRILINTA) 90 MG TABS tablet Take 1 tablet (90 mg total) by mouth 2 (two) times  daily. 10/11/20   Charlynne Cousins, MD    Family History    Family History  Problem Relation Age of Onset   Hypertension Mother    Hypertension Father    Hypertension Brother    Stroke Sister    She indicated that her mother is deceased. She indicated that her father is deceased. She indicated that her sister is alive. She indicated that her brother is deceased.   Social History    Social History   Socioeconomic History   Marital status: Single    Spouse name: Not on file   Number of children: 3   Years of education: Not on file   Highest education level: 12th grade  Occupational History   Not on file  Tobacco Use   Smoking status: Some Days    Types: Cigars   Smokeless tobacco: Never  Vaping Use   Vaping Use: Never used  Substance and Sexual Activity   Alcohol use: Yes    Comment: social drinking   Drug use: Not Currently    Types: Marijuana, Cocaine    Comment: prior  heroin use stopped Dec 2015   Sexual activity: Not Currently    Birth control/protection: Post-menopausal  Other Topics Concern   Not on file  Social History Narrative   Not on file   Social Determinants of Health   Financial Resource Strain: High Risk   Difficulty of Paying Living Expenses: Hard  Food Insecurity: No Food Insecurity   Worried About Running Out of Food in the Last Year: Never true   Ran Out of Food in the Last Year: Never true  Transportation Needs: No Transportation Needs   Lack of Transportation (Medical): No   Lack of Transportation (Non-Medical): No  Physical Activity: Not on file  Stress: Not on file  Social Connections: Not on file  Intimate Partner Violence: Not on file     Review of Systems    General:  No chills, fever, night sweats or weight changes.  Cardiovascular:  No chest pain, dyspnea on exertion, edema, orthopnea, palpitations, paroxysmal nocturnal dyspnea. Dermatological: No rash, lesions/masses Respiratory: No cough, dyspnea Urologic: No  hematuria, dysuria Abdominal:   No nausea, vomiting, diarrhea, bright red blood per rectum, melena, or hematemesis Neurologic:  No visual changes, wkns, changes in mental status. All other systems reviewed and are otherwise negative except as noted above.  Physical Exam    VS:  LMP  (LMP Unknown) Comment: Pt unsure why she no longer gets her period , BMI There is no height or weight on file to calculate BMI. GEN: Well nourished, well developed, in no acute distress. HEENT: normal. Neck: Supple, no JVD, carotid bruits, or masses. Cardiac: RRR, no murmurs, rubs, or gallops. No clubbing, cyanosis, edema.  Radials/DP/PT 2+ and equal bilaterally.  Respiratory:  Respirations regular and unlabored, clear to auscultation bilaterally. GI: Soft, nontender, nondistended, BS + x 4. MS: no deformity or atrophy. Skin: warm and dry, no rash. Neuro:  Strength and sensation are intact. Psych: Normal affect.  Accessory Clinical Findings    Recent Labs: 10/09/2020: B Natriuretic Peptide 304.5 10/10/2020: Hemoglobin 12.0; Platelets 170 03/03/2021: ALT 14; BUN 17; Creatinine, Ser 1.07; Potassium 4.4; Sodium 141   Recent Lipid Panel    Component Value Date/Time   CHOL 156 03/03/2021 1132   TRIG 83 03/03/2021 1132   HDL 60 03/03/2021 1132   CHOLHDL 2.6 03/03/2021 1132   CHOLHDL 4.2 10/09/2020 1329   VLDL 20 10/09/2020 1329   LDLCALC 80 03/03/2021 1132    ECG personally reviewed by me today-sinus bradycardia LVH 57 bpm no ST or T wave deviation.   EKG 10/23/2020 normal sinus rhythm possible left atrial enlargement left ventricular hypertrophy 64 bpm- No acute changes    Cardiac catheterization 10/09/2020  CONCLUSIONS: 95-99% mid RCA treated with overlapping drug-eluting stents postdilated to 3.5 mm in diameter.  TIMI grade III flow was maintained pre and post stenting. Intermediate stenosis in the mid LAD [place] 111 stenosis estimated EF 50% and demonstrated to be hemodynamically insignificant  with an RFR of 0.92. 70 to 80% mid to distal circumflex followed by 2 small obtuse marginal branches.  Medical therapy recommended. Left main is widely patent Acute on chronic systolic heart failure with EF 30 to 35%, LVEDP 20 mmHg.  Etiology is likely multifactorial (uncontrolled hypertension/substance abuse/ischemia). Recommendation:   Dual antiplatelet therapy for 12 months.  First month should be aspirin and Brilinta.  Okay to switch to Plavix and aspirin after 30 days. Guideline directed therapy for heart failure.  Metoprolol and losartan have been prescribed.  Metoprolol should be switched to the  succinate preparation. Control of blood pressure.  IV nitroglycerin is temporarily being used while metoprolol and losartan are being initiated.  Patient ordinarily takes Catapres but it is unclear how frequent this occurs. Aggressive secondary prevention.  This includes avoidance of substance use/tobacco/medication noncompliance.  Diagnostic Dominance: Right Intervention    Assessment & Plan   1. Systolic CHF/ischemic cardiomyopathy-continues to be euvolemic .  No increased DOE or activity intolerance.  Echocardiogram showed EF 45%.  Plan to continue to titrate guideline directed medical therapy and repeat echocardiogram after medications been optimized. Continue metoprolol Continue Entresto 49/51-plan to uptitrate Entresto if stable BMP Heart healthy low-sodium diet-salty 6 given Increase physical activity as tolerated Recommend repeat echocardiogram when heart failure medications been optimized. Repeat BMP    Coronary artery disease-no recent episodes of chest discomfort/pain.   Underwent cardiac catheterization with PCI and DES 10/09/2020.  She received PCI with DES to her mid RCA.  Started filling out [employer] patient assistance paperwork today. Continue aspirin, Plavix, atorvastatin,  metoprolol Heart healthy low-sodium diet-salty 6 given Increase physical activity as  tolerated  Hyperlipidemia-10/09/2020: VLDL 20 03/03/2021: Cholesterol, Total 156; HDL 60; LDL Chol Calc (NIH) 80; Triglycerides 83 Continue atorvastatin, aspirin, ezetimibe*** Heart healthy low-sodium high-fiber diet Increase physical activity as tolerated Repeat fasting lipids and LFTs in 2 weeks  Uncontrolled hypertension-BP today ***138/82.  Reports elevated blood pressure at home sometimes in the 419 range systolic.   Continue  metoprolol, Entresto Heart healthy low-sodium diet-salty 6 given Increase physical activity as tolerated Maintain blood pressure log  Polysubstance abuse-denies recreational drug use.   Congratulated on cessation  Disposition: Follow-up with follow-up with Dr. Debara Pickett or me in 1-2 months.  Jossie Ng. Rafael Salway NP-C    03/24/2021, 7:43 AM [address] [place] Office [phone] Fax [phone]  Notice: This dictation was prepared with Dragon dictation along with smaller phrase technology. Any transcriptional errors that result from this process are unintentional and may not be corrected upon review.  I spent 13 minutes examining this patient, reviewing medications, and using patient centered shared decision making involving her cardiac care.  Prior to her visit I spent greater than 20 minutes reviewing her past medical history,  medications, and prior cardiac tests.

## 2021-03-29 ENCOUNTER — Ambulatory Visit: Payer: No Typology Code available for payment source | Admitting: General Practice

## 2021-04-07 ENCOUNTER — Ambulatory Visit (INDEPENDENT_AMBULATORY_CARE_PROVIDER_SITE_OTHER): Payer: No Typology Code available for payment source

## 2021-04-07 DIAGNOSIS — I5022 Chronic systolic (congestive) heart failure: Secondary | ICD-10-CM

## 2021-04-08 LAB — CUP PACEART REMOTE DEVICE CHECK
Date Time Interrogation Session: 20230321231509
Implantable Pulse Generator Implant Date: 20200610

## 2021-04-20 NOTE — Progress Notes (Signed)
Carelink Summary Report / Loop Recorder 

## 2021-05-09 NOTE — Progress Notes (Signed)
? ?Cardiology Clinic Note  ? ?Patient Name: Leslie Walker ?Date of Encounter: 05/11/2021 ? ?Primary Care Provider:  Elbert Ewings, FNP ?Primary Cardiologist:  Pixie Casino, MD ? ?Patient Profile  ?  ?Leslie Walker 56 year old female presents the clinic today for follow-up of her coronary artery disease and chronic systolic CHF. ? ?Past Medical History  ?  ?Past Medical History:  ?Diagnosis Date  ? Cocaine abuse ([place])   ? Dyslipidemia   ? Hypertension   ? Noncompliance w/medication treatment due to intermit use of medication   ? Stroke [facility]) 2021  ? no deficits  ? ?Past Surgical History:  ?Procedure Laterality Date  ? CORONARY STENT INTERVENTION N/A 10/09/2020  ? Procedure: CORONARY STENT INTERVENTION;  Surgeon: Belva Crome, MD;  Location: [place] [facility];  Service: Cardiovascular;  Laterality: N/A;  ? INTRAVASCULAR PRESSURE WIRE/FFR STUDY N/A 10/09/2020  ? Procedure: INTRAVASCULAR PRESSURE WIRE/FFR STUDY;  Surgeon: Belva Crome, MD;  Location: [place] [facility];  Service: Cardiovascular;  Laterality: N/A;  ? LEFT HEART CATH AND CORONARY ANGIOGRAPHY N/A 10/09/2020  ? Procedure: LEFT HEART CATH AND CORONARY ANGIOGRAPHY;  Surgeon: Belva Crome, MD;  Location: [place] [facility];  Service: Cardiovascular;  Laterality: N/A;  ? LOOP RECORDER INSERTION N/A 06/27/2018  ? Procedure: LOOP RECORDER INSERTION;  Surgeon: Constance Haw, MD;  Location: [place] [facility];  Service: Cardiovascular;  Laterality: N/A;  ? ? ?Allergies ? ?No Known Allergies ? ?History of Present Illness  ?  ?Leslie Walker has a PMH of uncontrolled hypertension, TIA, NSTEMI, chronic systolic CHF, cellulitis right foot, heroin dependence, polysubstance abuse, HLD, tobacco dependence, chronic pain syndrome, cocaine use, and precordial chest pain. ? ?She underwent cardiac catheterization 10/09/2020 which showed 95-99% mid RCA lesion, intermediate stenosis of mid LAD with a RFR of 0.92, 70-80% mid-distal circumflex followed by 2 small  obtuse marginal branches, left main widely patent, acute on chronic systolic CHF 08-67% which was felt to be multifactorial due to uncontrolled hypertension, substance abuse, and ischemia.  She received PCI with DES to her mid RCA.  Plan was made to continue aspirin and Brilinta x1 month then switch to Plavix and aspirin after 30 days. ? ?She presented to  the clinic 10/23/2020 for follow-up evaluation stated she felt well.  She had slowly been increasing her physical activity.  We reviewed the importance of heart healthy low-sodium high-fiber diet.  We reviewed her angiography and she expressed understanding.  We reviewed her medications which she is tolerating well and without side effects.  She brought in the [employer] patient assistance form.  We started filling the forms out today.  She plan to complete the rest of the form and bring it back to the office.  I  gave her the salty 6 diet sheet, gave her a letter for return to work without cardiac restrictions, stopped her losartan and started her on valsartan 40 mg daily, and we planned  follow-up for 1 month.  We we plan to have her return for BMP in 1 week. ? ?She presented to the clinic 12/23/20 for follow-up evaluation stated she felt well.  She denied episodes of chest discomfort and shortness of breath.  She reported that she continued to refrain from recreational drugs.  She had been transitioned from Brilinta to Plavix.  She was tolerating all her medications well without side effects.  We reviewed her blood pressure and she reported that her blood pressure at home was in the 619-509 systolic range.  I increased her valsartan to 80 mg daily, gave her the salty 6 diet sheet, had her increase her physical activity as tolerated, ordered a BMP and again in 1 week, and planned follow-up in 1-2 months.  I will also give her the [place] support stocking sheet. ? ?She presented to the clinic 03/03/2021 for follow-up evaluation stated she felt well.  She did  report that she did use some marijuana and alcohol over the holidays.  She reported medication compliance.  She did note the occasional elevated blood pressure at home in the 573 range systolic.  Her blood pressure was slightly elevated today at 138/82.  I increased her valsartan medication with plan to transition her to [facility].  I ordered a BMP and plan follow-up for 1 month.  Her BMP showed a slight increase in her creatinine of 1.07.  Her LDL cholesterol was 80.  Ezetimibe 10 mg daily was added to her medication list.  Valsartan was stopped and she was transitioned to [facility] 49/51 ? ?She presents to the clinic today for follow-up evaluation and states she notices that she is improving but has still not returned to full baseline activity.  She has been doing light yard work and mowing her grass with a Chiropractor.  She reports that she is compliant with low-sodium diet and notices that her blood pressure at home is in the 140-150/70-80 range.  Initially in the office today her blood pressure is 180/90 and on recheck it is 142/76.  We reviewed the progression of guideline directed medical therapy related to her heart failure.  She expressed understanding.  I will increase her [name], order a BMP today and in 1 week and plan for echocardiogram in 1-2 months. ? ?Today she denies chest pain, shortness of breath, lower extremity edema, fatigue, palpitations, melena, hematuria, hemoptysis, diaphoresis, weakness, presyncope, syncope, orthopnea, and PND. ? ? ?Home Medications  ?  ?Prior to Admission medications   ?Medication Sig Start Date End Date Taking? Authorizing Provider  ?aspirin 81 MG EC tablet Take 1 tablet (81 mg total) by mouth daily. 10/11/20   Charlynne Cousins, MD  ?atorvastatin (LIPITOR) 40 MG tablet Take 1 tablet (40 mg total) by mouth daily at 6 PM. ?Patient not taking: Reported on 10/09/2020 06/30/18   Georgette Shell, MD  ?furosemide (LASIX) 20 MG tablet Take 20 mg by mouth daily. 06/27/20    [provider]  ?gabapentin (NEURONTIN) 100 MG capsule Take 100 mg by mouth 3 (three) times daily as needed (nerve pain). 04/22/20   [provider]  ?hydrOXYzine (ATARAX/VISTARIL) 25 MG tablet Take 1 tablet (25 mg total) by mouth every 6 (six) hours as needed for anxiety. ?Patient not taking: Reported on 10/09/2020 06/30/18   Georgette Shell, MD  ?losartan (COZAAR) 50 MG tablet Take 1 tablet (50 mg total) by mouth daily. 10/11/20   Charlynne Cousins, MD  ?metoprolol tartrate (LOPRESSOR) 50 MG tablet Take 1 tablet (50 mg total) by mouth 2 (two) times daily. 10/11/20   Charlynne Cousins, MD  ?ticagrelor (BRILINTA) 90 MG TABS tablet Take 1 tablet (90 mg total) by mouth 2 (two) times daily. 10/11/20   Charlynne Cousins, MD  ? ? ?Family History  ?  ?Family History  ?Problem Relation Age of Onset  ? Hypertension Mother   ? Hypertension Father   ? Hypertension Brother   ? Stroke Sister   ? ?She indicated that her mother is deceased. She indicated that her father is deceased.  She indicated that her sister is alive. She indicated that her brother is deceased. ? ? ?Social History  ?  ?Social History  ? ?Socioeconomic History  ? Marital status: Single  ?  Spouse name: Not on file  ? Number of children: 3  ? Years of education: Not on file  ? Highest education level: 12th grade  ?Occupational History  ? Not on file  ?Tobacco Use  ? Smoking status: Some Days  ?  Types: Cigars  ? Smokeless tobacco: Never  ?Vaping Use  ? Vaping Use: Never used  ?Substance and Sexual Activity  ? Alcohol use: Yes  ?  Comment: social drinking  ? Drug use: Not Currently  ?  Types: Marijuana, Cocaine  ?  Comment: 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  ? Sexual activity: Not Currently  ?  Birth control/protection: Post-menopausal  ?Other Topics Concern  ? Not on file  ?Social History Narrative  ? Not on file  ? ?Social Determinants of Health  ? ?Financial Resource Strain: High Risk  ? Difficulty of Paying Living Expenses: Hard   ?Food Insecurity: No Food Insecurity  ? Worried About Charity fundraiser in the Last Year: Never true  ? Ran Out of Food in the Last Year: Never true  ?Transportation Needs: No Transportation Needs  ? Lack of

## 2021-05-10 ENCOUNTER — Ambulatory Visit (INDEPENDENT_AMBULATORY_CARE_PROVIDER_SITE_OTHER): Payer: No Typology Code available for payment source

## 2021-05-10 DIAGNOSIS — I255 Ischemic cardiomyopathy: Secondary | ICD-10-CM

## 2021-05-10 LAB — CUP PACEART REMOTE DEVICE CHECK
Date Time Interrogation Session: 20230423232759
Implantable Pulse Generator Implant Date: 20200610

## 2021-05-11 ENCOUNTER — Ambulatory Visit (INDEPENDENT_AMBULATORY_CARE_PROVIDER_SITE_OTHER): Payer: No Typology Code available for payment source | Admitting: General Practice

## 2021-05-11 ENCOUNTER — Encounter: Payer: Self-pay | Admitting: General Practice

## 2021-05-11 VITALS — BP 142/76 | HR 66 | Ht 64.0 in | Wt 163.6 lb

## 2021-05-11 DIAGNOSIS — I2510 Atherosclerotic heart disease of native coronary artery without angina pectoris: Secondary | ICD-10-CM

## 2021-05-11 DIAGNOSIS — E782 Mixed hyperlipidemia: Secondary | ICD-10-CM

## 2021-05-11 DIAGNOSIS — F1910 Other psychoactive substance abuse, uncomplicated: Secondary | ICD-10-CM

## 2021-05-11 DIAGNOSIS — I5022 Chronic systolic (congestive) heart failure: Secondary | ICD-10-CM

## 2021-05-11 DIAGNOSIS — I10 Essential (primary) hypertension: Secondary | ICD-10-CM

## 2021-05-11 MED ORDER — SACUBITRIL-VALSARTAN 97-103 MG PO TABS: 1.0000 | ORAL_TABLET | Freq: Two times a day (BID) | ORAL | 3 refills | Status: DC

## 2021-05-11 NOTE — Patient Instructions (Signed)
Medication Instructions:  ?INCREASE YOUR ENTRESTO 97/103-MAY TAKE 2 OF WHAT YOU HAVE UNTIL THEY ARE GONE ? ?*If you need a refill on your cardiac medications before your next appointment, please call your pharmacy* ? ?Lab Work:    ?BMET TODAY AND IN 1 WEEK ? ?If you have labs (blood work) drawn today and your tests are completely normal, you will receive your results only by: ?MyChart Message (if you have MyChart) OR  A paper copy in the mail ?If you have any lab test that is abnormal or we need to change your treatment, we will call you to review the results. ? ?Special Instructions ? ?PLEASE MAINTAIN PHYSICAL ACTIVITY AS TOLERATED  ? ?Follow-Up: ?Your next appointment:  1-2 month(s) In Person with Pixie Casino, MD  or Coletta Memos, FNP      :1 ? ?At [facility], you and your health needs are our priority.  As part of our continuing mission to provide you with exceptional heart care, we have created designated Provider Care Teams.  These Care Teams include your primary Cardiologist (physician) and Advanced Practice Providers (APPs -  Physician Assistants and Nurse Practitioners) who all work together to provide you with the care you need, when you need it. ? ?We recommend signing up for the patient portal called "MyChart".  Sign up information is provided on this After Visit Summary.  MyChart is used to connect with patients for Virtual Visits (Telemedicine).  Patients are able to view lab/test results, encounter notes, upcoming appointments, etc.  Non-urgent messages can be sent to your provider as well.   ?To learn more about what you can do with MyChart, go to [email or web address].   ? ? ?Important Information About Sugar ? ? ? ? ? ? ?      ?

## 2021-05-12 LAB — BASIC METABOLIC PANEL
BUN/Creatinine Ratio: 12 (ref 9–23)
BUN: 13 mg/dL (ref 6–24)
CO2: 25 mmol/L (ref 20–29)
Calcium: 9.1 mg/dL (ref 8.7–10.2)
Chloride: 105 mmol/L (ref 96–106)
Creatinine, Ser: 1.07 mg/dL — ABNORMAL HIGH (ref 0.57–1.00)
Glucose: 125 mg/dL — ABNORMAL HIGH (ref 70–99)
Potassium: 4 mmol/L (ref 3.5–5.2)
Sodium: 145 mmol/L — ABNORMAL HIGH (ref 134–144)
eGFR: 61 mL/min/{1.73_m2} (ref >59)

## 2021-05-26 NOTE — Progress Notes (Signed)
Carelink Summary Report / Loop Recorder 

## 2021-06-14 NOTE — Progress Notes (Unsigned)
Cardiology Clinic Note   Patient Name: Leslie Walker Date of Encounter: 06/15/2021  Primary Care Provider:  Elbert Ewings, [place] Primary Cardiologist:  Pixie Casino, MD  Patient Profile    Leslie Walker 56 year old female presents the clinic today for follow-up of her chronic systolic CHF and medication titration.  Past Medical History    Past Medical History:  Diagnosis Date   Cocaine abuse ([place])    Dyslipidemia    Hypertension    Noncompliance w/medication treatment due to intermit use of medication    Stroke [facility]) 2021   no deficits   Past Surgical History:  Procedure Laterality Date   CORONARY STENT INTERVENTION N/A 10/09/2020   Procedure: CORONARY STENT INTERVENTION;  Surgeon: Belva Crome, MD;  Location: [place] [facility];  Service: Cardiovascular;  Laterality: N/A;   INTRAVASCULAR PRESSURE WIRE/FFR STUDY N/A 10/09/2020   Procedure: INTRAVASCULAR PRESSURE WIRE/FFR STUDY;  Surgeon: Belva Crome, MD;  Location: [place] [facility];  Service: Cardiovascular;  Laterality: N/A;   LEFT HEART CATH AND CORONARY ANGIOGRAPHY N/A 10/09/2020   Procedure: LEFT HEART CATH AND CORONARY ANGIOGRAPHY;  Surgeon: Belva Crome, MD;  Location: [place] [facility];  Service: Cardiovascular;  Laterality: N/A;   LOOP RECORDER INSERTION N/A 06/27/2018   Procedure: LOOP RECORDER INSERTION;  Surgeon: Constance Haw, MD;  Location: [place] [facility];  Service: Cardiovascular;  Laterality: N/A;    Allergies  No Known Allergies  History of Present Illness    Leslie Walker has a PMH of uncontrolled hypertension, TIA, NSTEMI, chronic systolic CHF, cellulitis right foot, heroin dependence, polysubstance abuse, HLD, tobacco dependence, chronic pain syndrome, cocaine use, and precordial chest pain.  She underwent cardiac catheterization 10/09/2020 which showed 95-99% mid RCA lesion, intermediate stenosis of mid LAD with a RFR of 0.92, 70-80% mid-distal circumflex followed by 2 small  obtuse marginal branches, left main widely patent, acute on chronic systolic CHF 30-86% which was felt to be multifactorial due to uncontrolled hypertension, substance abuse, and ischemia.  She received PCI with DES to her mid RCA.  Plan was made to continue aspirin and Brilinta x1 month then switch to Plavix and aspirin after 30 days.  She presented to  the clinic 10/23/2020 for follow-up evaluation stated she felt well.  She had slowly been increasing her physical activity.  We reviewed the importance of heart healthy low-sodium high-fiber diet.  We reviewed her angiography and she expressed understanding.  We reviewed her medications which she is tolerating well and without side effects.  She brought in the [employer] patient assistance form.  We started filling the forms out today.  She plan to complete the rest of the form and bring it back to the office.  I  gave her the salty 6 diet sheet, gave her a letter for return to work without cardiac restrictions, stopped her losartan and started her on valsartan 40 mg daily, and we planned  follow-up for 1 month.  We we plan to have her return for BMP in 1 week.  She presented to the clinic 12/23/20 for follow-up evaluation stated she felt well.  She denied episodes of chest discomfort and shortness of breath.  She reported that she continued to refrain from recreational drugs.  She had been transitioned from Brilinta to Plavix.  She was tolerating all her medications well without side effects.  We reviewed her blood pressure and she reported that her blood pressure at home was in the 578-469 systolic range.  I increased her valsartan to 80 mg daily, gave her the salty 6 diet sheet, had her increase her physical activity as tolerated, ordered a BMP and again in 1 week, and planned follow-up in 1-2 months.  I will also give her the [place] support stocking sheet.  She presented to the clinic 03/03/2021 for follow-up evaluation stated she felt well.  She did  report that she did use some marijuana and alcohol over the holidays.  She reported medication compliance.  She did note the occasional elevated blood pressure at home in the 027 range systolic.  Her blood pressure was slightly elevated today at 138/82.  I increased her valsartan medication with plan to transition her to [facility].  I ordered a BMP and plan follow-up for 1 month.  Her BMP showed a slight increase in her creatinine of 1.07.  Her LDL cholesterol was 80.  Ezetimibe 10 mg daily was added to her medication list.  Valsartan was stopped and she was transitioned to [facility] 49/51  She presented to the clinic 05/11/21 for follow-up evaluation and stated she noticed that she was improving but had still not returned to full baseline activity.  She had been doing light yard work and mowing her grass with a Chiropractor.  She reported that she was compliant with low-sodium diet and noticed that her blood pressure at home was in the 140-150/70-80 range.  Initially in the office today her blood pressure was 180/90 and on recheck it is 142/76.  We reviewed the progression of guideline directed medical therapy related to her heart failure.  She expressed understanding.  I  increased her Entresto, ordered a BMP  and again in 1 week and planned for echocardiogram in 1-2 months.  She presents to the clinic today for follow-up evaluation states she has not started taking higher dose of Entresto due to cost.  She reports that it would be $800 for her to purchase.  We reviewed her other medications.  She reports compliance with her aspirin, furosemide, metoprolol, and 4951 Entresto.  I reviewed the importance of medications and she expressed understanding.  I reordered her atorvastatin, clopidogrel, and Entresto.  She reports that she does not have insurance.  I have put her in contact with Michiel Cowboy our Education officer, museum.  She continues to work regularly at [employer].  She has not been eating high salt foods.  I will plan to  see her back in 1 month and have instructed her that social work will call her with options for assistance.  Today she denies chest pain, shortness of breath, lower extremity edema, fatigue, palpitations, melena, hematuria, hemoptysis, diaphoresis, weakness, presyncope, syncope, orthopnea, and PND.   Home Medications    Prior to Admission medications   Medication Sig Start Date End Date Taking? Authorizing Provider  aspirin 81 MG EC tablet Take 1 tablet (81 mg total) by mouth daily. 10/11/20   Charlynne Cousins, MD  atorvastatin (LIPITOR) 40 MG tablet Take 1 tablet (40 mg total) by mouth daily at 6 PM. Patient not taking: Reported on 10/09/2020 06/30/18   Georgette Shell, MD  furosemide (LASIX) 20 MG tablet Take 20 mg by mouth daily. 06/27/20   [provider]  gabapentin (NEURONTIN) 100 MG capsule Take 100 mg by mouth 3 (three) times daily as needed (nerve pain). 04/22/20   [provider]  hydrOXYzine (ATARAX/VISTARIL) 25 MG tablet Take 1 tablet (25 mg total) by mouth every 6 (six) hours as needed for anxiety. Patient not taking:  Reported on 10/09/2020 06/30/18   Georgette Shell, MD  losartan (COZAAR) 50 MG tablet Take 1 tablet (50 mg total) by mouth daily. 10/11/20   Charlynne Cousins, MD  metoprolol tartrate (LOPRESSOR) 50 MG tablet Take 1 tablet (50 mg total) by mouth 2 (two) times daily. 10/11/20   Charlynne Cousins, MD  ticagrelor (BRILINTA) 90 MG TABS tablet Take 1 tablet (90 mg total) by mouth 2 (two) times daily. 10/11/20   Charlynne Cousins, MD    Family History    Family History  Problem Relation Age of Onset   Hypertension Mother    Hypertension Father    Hypertension Brother    Stroke Sister    She indicated that her mother is deceased. She indicated that her father is deceased. She indicated that her sister is alive. She indicated that her brother is deceased.   Social History    Social History   Socioeconomic History   Marital status:  Single    Spouse name: Not on file   Number of children: 3   Years of education: Not on file   Highest education level: 12th grade  Occupational History   Not on file  Tobacco Use   Smoking status: Some Days    Types: Cigars   Smokeless tobacco: Never  Vaping Use   Vaping Use: Never used  Substance and Sexual Activity   Alcohol use: Yes    Comment: social drinking   Drug use: Not Currently    Types: Marijuana, Cocaine    Comment: 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   Sexual activity: Not Currently    Birth control/protection: Post-menopausal  Other Topics Concern   Not on file  Social History Narrative   Not on file   Social Determinants of Health   Financial Resource Strain: High Risk   Difficulty of Paying Living Expenses: Hard  Food Insecurity: No Food Insecurity   Worried About Running Out of Food in the Last Year: Never true   Ran Out of Food in the Last Year: Never true  Transportation Needs: No Transportation Needs   Lack of Transportation (Medical): No   Lack of Transportation (Non-Medical): No  Physical Activity: Not on file  Stress: Not on file  Social Connections: Not on file  Intimate Partner Violence: Not on file     Review of Systems    General:  No chills, fever, night sweats or weight changes.  Cardiovascular:  No chest pain, dyspnea on exertion, edema, orthopnea, palpitations, paroxysmal nocturnal dyspnea. Dermatological: No rash, lesions/masses Respiratory: No cough, dyspnea Urologic: No hematuria, dysuria Abdominal:   No nausea, vomiting, diarrhea, bright red blood per rectum, melena, or hematemesis Neurologic:  No visual changes, wkns, changes in mental status. All other systems reviewed and are otherwise negative except as noted above.  Physical Exam    VS:  BP (!) 158/88   Pulse 86   Ht '5\' 4"'$  (1.626 m)   Wt 155 lb 6.4 oz (70.5 kg)   LMP  (LMP Unknown) Comment: Pt unsure why she no longer gets her period  SpO2 99%   BMI 26.67 kg/m  ,  BMI Body mass index is 26.67 kg/m. GEN: Well nourished, well developed, in no acute distress. HEENT: normal. Neck: Supple, no JVD, carotid bruits, or masses. Cardiac: RRR, no murmurs, rubs, or gallops. No clubbing, cyanosis, edema.  Radials/DP/PT 2+ and equal bilaterally.  Respiratory:  Respirations regular and unlabored, clear to auscultation bilaterally. GI: Soft, nontender, nondistended, BS +  x 4. MS: no deformity or atrophy. Skin: warm and dry, no rash. Neuro:  Strength and sensation are intact. Psych: Normal affect.  Accessory Clinical Findings    Recent Labs: 10/09/2020: B Natriuretic Peptide 304.5 10/10/2020: Hemoglobin 12.0; Platelets 170 03/03/2021: ALT 14 05/11/2021: BUN 13; Creatinine, Ser 1.07; Potassium 4.0; Sodium 145   Recent Lipid Panel    Component Value Date/Time   CHOL 156 03/03/2021 1132   TRIG 83 03/03/2021 1132   HDL 60 03/03/2021 1132   CHOLHDL 2.6 03/03/2021 1132   CHOLHDL 4.2 10/09/2020 1329   VLDL 20 10/09/2020 1329   LDLCALC 80 03/03/2021 1132    ECG personally reviewed by me today-none today.  EKG 03/03/2021 sinus bradycardia LVH 57 bpm no ST or T wave deviation.   EKG 10/23/2020 normal sinus rhythm possible left atrial enlargement left ventricular hypertrophy 64 bpm- No acute changes    Cardiac catheterization 10/09/2020  CONCLUSIONS: 95-99% mid RCA treated with overlapping drug-eluting stents postdilated to 3.5 mm in diameter.  TIMI grade III flow was maintained pre and post stenting. Intermediate stenosis in the mid LAD [place] 111 stenosis estimated EF 50% and demonstrated to be hemodynamically insignificant with an RFR of 0.92. 70 to 80% mid to distal circumflex followed by 2 small obtuse marginal branches.  Medical therapy recommended. Left main is widely patent Acute on chronic systolic heart failure with EF 30 to 35%, LVEDP 20 mmHg.  Etiology is likely multifactorial (uncontrolled hypertension/substance abuse/ischemia). Recommendation:    Dual antiplatelet therapy for 12 months.  First month should be aspirin and Brilinta.  Okay to switch to Plavix and aspirin after 30 days. Guideline directed therapy for heart failure.  Metoprolol and losartan have been prescribed.  Metoprolol should be switched to the succinate preparation. Control of blood pressure.  IV nitroglycerin is temporarily being used while metoprolol and losartan are being initiated.  Patient ordinarily takes Catapres but it is unclear how frequent this occurs. Aggressive secondary prevention.  This includes avoidance of substance use/tobacco/medication noncompliance.  Diagnostic Dominance: Right Intervention    Assessment & Plan   1. Systolic CHF/ischemic cardiomyopathy- euvolemic .  No increased DOE or activity intolerance.  Echocardiogram showed EF 45%.  Plan to continue to titrate guideline directed medical therapy and repeat echocardiogram after medications been optimized. Continue metoprolol Increase Entresto 97/103 Heart healthy low-sodium diet-salty 6 given Increase physical activity as tolerated Recommend repeat echocardiogram when heart failure medications been optimized. Order echocardiogram one month after medication has been optimized Plan to start spironolactone 12.5 mg daily Plan to start Farxiga 5 mg daily Repeat BMP  in 1 week  Coronary artery disease-denies chest pain.     Cardiac catheterization with PCI and DES 10/09/2020.  She received PCI with DES to her mid RCA.  Started filling out [employer] patient assistance paperwork today. Continue aspirin, Plavix, atorvastatin,  metoprolol Heart healthy low-sodium diet-salty 6 given Increase physical activity as tolerated  Hyperlipidemia-10/09/2020: VLDL 20 03/03/2021: Cholesterol, Total 156; HDL 60; LDL Chol Calc (NIH) 80; Triglycerides 83 Continue atorvastatin, aspirin Heart healthy low-sodium high-fiber diet Increase physical activity as tolerated   Uncontrolled hypertension-BP today  158/88.  Has been having trouble affording medications.  We will reach back out to social work for patient assistance/medication options.     Continue  metoprolol, Entresto Start spironolactone Heart healthy low-sodium diet-reviewed Increase physical activity as tolerated Maintain blood pressure log  Polysubstance abuse-reports occasional alcohol consumption and denies smoking marijuana.  Denies use of recreational drugs.   Disposition: Follow-up  with follow-up with Dr. Debara Pickett or me in 1-2 months.  Jossie Ng. Kalenna Millett NP-C    06/15/2021, 2:45 PM [facility] [address] Office [phone] Fax [phone]  Notice: This dictation was prepared with Dragon dictation along with smaller phrase technology. Any transcriptional errors that result from this process are unintentional and may not be corrected upon review.  I spent 14 minutes examining this patient, reviewing medications, and using patient centered shared decision making involving her cardiac care.  Prior to her visit I spent greater than 20 minutes reviewing her past medical history,  medications, and prior cardiac tests.

## 2021-06-15 ENCOUNTER — Encounter: Payer: Self-pay | Admitting: General Practice

## 2021-06-15 ENCOUNTER — Ambulatory Visit (INDEPENDENT_AMBULATORY_CARE_PROVIDER_SITE_OTHER): Payer: No Typology Code available for payment source | Admitting: General Practice

## 2021-06-15 ENCOUNTER — Ambulatory Visit (INDEPENDENT_AMBULATORY_CARE_PROVIDER_SITE_OTHER): Payer: No Typology Code available for payment source

## 2021-06-15 VITALS — BP 158/88 | HR 86 | Ht 64.0 in | Wt 155.4 lb

## 2021-06-15 DIAGNOSIS — I255 Ischemic cardiomyopathy: Secondary | ICD-10-CM

## 2021-06-15 DIAGNOSIS — E782 Mixed hyperlipidemia: Secondary | ICD-10-CM

## 2021-06-15 DIAGNOSIS — I5022 Chronic systolic (congestive) heart failure: Secondary | ICD-10-CM

## 2021-06-15 DIAGNOSIS — I2510 Atherosclerotic heart disease of native coronary artery without angina pectoris: Secondary | ICD-10-CM

## 2021-06-15 DIAGNOSIS — I10 Essential (primary) hypertension: Secondary | ICD-10-CM

## 2021-06-15 DIAGNOSIS — I214 Non-ST elevation (NSTEMI) myocardial infarction: Secondary | ICD-10-CM

## 2021-06-15 DIAGNOSIS — F1910 Other psychoactive substance abuse, uncomplicated: Secondary | ICD-10-CM

## 2021-06-15 MED ORDER — CLOPIDOGREL BISULFATE 75 MG PO TABS: 75.0000 mg | ORAL_TABLET | Freq: Every day | ORAL | 3 refills | Status: DC

## 2021-06-15 MED ORDER — ATORVASTATIN CALCIUM 40 MG PO TABS: 40.0000 mg | ORAL_TABLET | Freq: Every day | ORAL | 3 refills | Status: DC

## 2021-06-15 NOTE — Patient Instructions (Addendum)
Medication Instructions:  The current medical regimen is effective;  continue present plan and medications as directed. Please refer to the Current Medication list given to you today.   *If you need a refill on your cardiac medications before your next appointment, please call your pharmacy*  Lab Work:    BMET IN 1 WEEK If you have labs (blood work) drawn today and your tests are completely normal, you will receive your results only by: [place] (if you have MyChart) OR  A paper copy in the mail If you have any lab test that is abnormal or we need to change your treatment, we will call you to review the results.  Follow-Up: Your next appointment:  1 month(s) In Person with Pixie Casino, MD  or Coletta Memos, FNP        At [facility], you and your health needs are our priority.  As part of our continuing mission to provide you with exceptional heart care, we have created designated Provider Care Teams.  These Care Teams include your primary Cardiologist (physician) and Advanced Practice Providers (APPs -  Physician Assistants and Nurse Practitioners) who all work together to provide you with the care you need, when you need it.  We recommend signing up for the patient portal called "MyChart".  Sign up information is provided on this After Visit Summary.  MyChart is used to connect with patients for Virtual Visits (Telemedicine).  Patients are able to view lab/test results, encounter notes, upcoming appointments, etc.  Non-urgent messages can be sent to your provider as well.   To learn more about what you can do with MyChart, go to [email or web address].      Important Information About Sugar

## 2021-06-16 ENCOUNTER — Telehealth: Payer: Self-pay

## 2021-06-16 LAB — CUP PACEART REMOTE DEVICE CHECK
Date Time Interrogation Session: 20230526232637
Implantable Pulse Generator Implant Date: 20200610

## 2021-06-16 NOTE — Telephone Encounter (Signed)
Late entry: called and informed pt I have samples for her and to bring entresto pt assist forms ASAP because it is very hard to get samples for her and we still have to fax forms and get them approved. Also, informed pt to make sure to bring proof of income as well as forms. Verbalized understanding. She states that she will be in tomorrow.

## 2021-06-23 NOTE — Telephone Encounter (Signed)
LM2CB 

## 2021-06-29 ENCOUNTER — Telehealth: Payer: Self-pay

## 2021-06-29 NOTE — Telephone Encounter (Signed)
Spoke with patient, informed her that needed manual transmission from her home monitor for missing EGMs patient stated she was getting ready to go to work and would call back later this afternoon for instructions on how to send transmission   LINQ alert received. 2 logged tachy events, no EGM's for review Route to triage

## 2021-06-29 NOTE — Telephone Encounter (Signed)
Late entry 06-28-2021: left message #2

## 2021-06-30 NOTE — Telephone Encounter (Signed)
I have Entresto samples for pt should she bring in her Pt assistance forms.

## 2021-07-01 NOTE — Progress Notes (Signed)
Carelink Summary Report / Loop Recorder 

## 2021-07-02 NOTE — Telephone Encounter (Signed)
Attempted to call patient to advise in need of manual transmission. No answer, LMTCB.

## 2021-07-06 NOTE — Telephone Encounter (Signed)
Attempted to reach out to patient about sending manual transmission. No answer, LMTCB.  Mychart is pending.

## 2021-07-08 NOTE — Telephone Encounter (Signed)
Attempted to contact patient to send manual transmission. No answer, LMTCB.   Certified letter sent.

## 2021-07-08 NOTE — Progress Notes (Signed)
Certified letter sent 

## 2021-07-15 NOTE — Telephone Encounter (Signed)
Left detailed message, pt has appt next week make sure to bring paperwork with her to that appointment.

## 2021-07-18 NOTE — Progress Notes (Deleted)
Cardiology Clinic Note   Patient Name: Leslie Walker Date of Encounter: 07/18/2021  Primary Care Provider:  Elbert Ewings, [place] Primary Cardiologist:  Pixie Casino, MD  Patient Profile    Leslie Walker 56 year old female presents the clinic today for follow-up of her chronic systolic CHF and medication titration.  Past Medical History    Past Medical History:  Diagnosis Date   Cocaine abuse ([place])    Dyslipidemia    Hypertension    Noncompliance w/medication treatment due to intermit use of medication    Stroke [facility]) 2021   no deficits   Past Surgical History:  Procedure Laterality Date   CORONARY STENT INTERVENTION N/A 10/09/2020   Procedure: CORONARY STENT INTERVENTION;  Surgeon: Belva Crome, MD;  Location: [place] [facility];  Service: Cardiovascular;  Laterality: N/A;   INTRAVASCULAR PRESSURE WIRE/FFR STUDY N/A 10/09/2020   Procedure: INTRAVASCULAR PRESSURE WIRE/FFR STUDY;  Surgeon: Belva Crome, MD;  Location: [place] [facility];  Service: Cardiovascular;  Laterality: N/A;   LEFT HEART CATH AND CORONARY ANGIOGRAPHY N/A 10/09/2020   Procedure: LEFT HEART CATH AND CORONARY ANGIOGRAPHY;  Surgeon: Belva Crome, MD;  Location: [place] [facility];  Service: Cardiovascular;  Laterality: N/A;   LOOP RECORDER INSERTION N/A 06/27/2018   Procedure: LOOP RECORDER INSERTION;  Surgeon: Constance Haw, MD;  Location: [place] [facility];  Service: Cardiovascular;  Laterality: N/A;    Allergies  No Known Allergies  History of Present Illness    Leslie Walker has a PMH of uncontrolled hypertension, TIA, NSTEMI, chronic systolic CHF, cellulitis right foot, heroin dependence, polysubstance abuse, HLD, tobacco dependence, chronic pain syndrome, cocaine use, and precordial chest pain.  She underwent cardiac catheterization 10/09/2020 which showed 95-99% mid RCA lesion, intermediate stenosis of mid LAD with a RFR of 0.92, 70-80% mid-distal circumflex followed by 2 small  obtuse marginal branches, left main widely patent, acute on chronic systolic CHF 00-86% which was felt to be multifactorial due to uncontrolled hypertension, substance abuse, and ischemia.  She received PCI with DES to her mid RCA.  Plan was made to continue aspirin and Brilinta x1 month then switch to Plavix and aspirin after 30 days.  She presented to  the clinic 10/23/2020 for follow-up evaluation stated she felt well.  She had slowly been increasing her physical activity.  We reviewed the importance of heart healthy low-sodium high-fiber diet.  We reviewed her angiography and she expressed understanding.  We reviewed her medications which she is tolerating well and without side effects.  She brought in the [employer] patient assistance form.  We started filling the forms out today.  She plan to complete the rest of the form and bring it back to the office.  I  gave her the salty 6 diet sheet, gave her a letter for return to work without cardiac restrictions, stopped her losartan and started her on valsartan 40 mg daily, and we planned  follow-up for 1 month.  We we plan to have her return for BMP in 1 week.  She presented to the clinic 12/23/20 for follow-up evaluation stated she felt well.  She denied episodes of chest discomfort and shortness of breath.  She reported that she continued to refrain from recreational drugs.  She had been transitioned from Brilinta to Plavix.  She was tolerating all her medications well without side effects.  We reviewed her blood pressure and she reported that her blood pressure at home was in the 761-950 systolic range.  I increased her valsartan to 80 mg daily, gave her the salty 6 diet sheet, had her increase her physical activity as tolerated, ordered a BMP and again in 1 week, and planned follow-up in 1-2 months.  I will also give her the [employer] support stocking sheet.  She presented to the clinic 03/03/2021 for follow-up evaluation stated she felt well.  She did  report that she did use some marijuana and alcohol over the holidays.  She reported medication compliance.  She did note the occasional elevated blood pressure at home in the 382 range systolic.  Her blood pressure was slightly elevated today at 138/82.  I increased her valsartan medication with plan to transition her to [facility].  I ordered a BMP and plan follow-up for 1 month.  Her BMP showed a slight increase in her creatinine of 1.07.  Her LDL cholesterol was 80.  Ezetimibe 10 mg daily was added to her medication list.  Valsartan was stopped and she was transitioned to [facility] 49/51  She presented to the clinic 05/11/21 for follow-up evaluation and stated she noticed that she was improving but had still not returned to full baseline activity.  She had been doing light yard work and mowing her grass with a Chiropractor.  She reported that she was compliant with low-sodium diet and noticed that her blood pressure at home was in the 140-150/70-80 range.  Initially in the office today her blood pressure was 180/90 and on recheck it is 142/76.  We reviewed the progression of guideline directed medical therapy related to her heart failure.  She expressed understanding.  I  increased her Entresto, ordered a BMP  and again in 1 week and planned for echocardiogram in 1-2 months.  She presented to the clinic 06/15/2021 for follow-up evaluation stated she had not started taking higher dose of Entresto due to cost.  She reported that it would be $800 for her to purchase.  We reviewed her other medications.  She reported compliance with her aspirin, furosemide, metoprolol, and 4951 Entresto.  I reviewed the importance of medications and she expressed understanding.  I reordered her atorvastatin, clopidogrel, and Entresto.  She reported that she did not have insurance.  I have put her in contact with Michiel Cowboy our Education officer, museum.  She continued to work regularly at [employer].  She had not been eating high salt foods.  I planned  to see her back in 1 month and instructed her that social work will call her with options for assistance.  She presents to the clinic today for follow-up evaluation states***  Today she denies chest pain, shortness of breath, lower extremity edema, fatigue, palpitations, melena, hematuria, hemoptysis, diaphoresis, weakness, presyncope, syncope, orthopnea, and PND.   1. Systolic CHF/ischemic cardiomyopathy-weight stable today and euvolemic.  Able to complete all of her daily activities and continues to work at [employer].  Echocardiogram showed EF 45%.   Continue metoprolol Increase Entresto 97/103 Heart healthy low-sodium diet-salty 6 given Increase physical activity as tolerated Recommend repeat echocardiogram when heart failure medications been optimized. Order echocardiogram one month after medication has been optimized*** Plan to start spironolactone 12.5 mg daily Plan to start Farxiga 5 mg daily Repeat BMP  in 1 week  Coronary artery disease-no recent episodes of chest pain.     Cardiac catheterization with PCI and DES 10/09/2020.  She received PCI with DES to her mid RCA.  Started filling out [employer] patient assistance paperwork during last visit. Continue aspirin, Plavix, atorvastatin,  metoprolol Heart  healthy low-sodium diet-salty 6-reviewed Increase physical activity as tolerated  Hyperlipidemia-10/09/2020: VLDL 20 03/03/2021: Cholesterol, Total 156; HDL 60; LDL Chol Calc (NIH) 80; Triglycerides 83 Continue atorvastatin, aspirin Heart healthy low-sodium high-fiber diet Increase physical activity as tolerated   Uncontrolled hypertension-BP today ***158/88.  Blood pressure well controlled with medication compliance.   Continue  metoprolol, Entresto Plan to start spironolactone Heart healthy low-sodium diet-reviewed Increase physical activity as tolerated Maintain blood pressure log  Polysubstance abuse-no use of alcohol or recreational drugs since last  visit.   Disposition: Follow-up with follow-up with Dr. Debara Pickett or me after echocardiogram.    Home Medications    Prior to Admission medications   Medication Sig Start Date End Date Taking? Authorizing Provider  aspirin 81 MG EC tablet Take 1 tablet (81 mg total) by mouth daily. 10/11/20   Charlynne Cousins, MD  atorvastatin (LIPITOR) 40 MG tablet Take 1 tablet (40 mg total) by mouth daily at 6 PM. Patient not taking: Reported on 10/09/2020 06/30/18   Georgette Shell, MD  furosemide (LASIX) 20 MG tablet Take 20 mg by mouth daily. 06/27/20   [provider]  gabapentin (NEURONTIN) 100 MG capsule Take 100 mg by mouth 3 (three) times daily as needed (nerve pain). 04/22/20   [provider]  hydrOXYzine (ATARAX/VISTARIL) 25 MG tablet Take 1 tablet (25 mg total) by mouth every 6 (six) hours as needed for anxiety. Patient not taking: Reported on 10/09/2020 06/30/18   Georgette Shell, MD  losartan (COZAAR) 50 MG tablet Take 1 tablet (50 mg total) by mouth daily. 10/11/20   Charlynne Cousins, MD  metoprolol tartrate (LOPRESSOR) 50 MG tablet Take 1 tablet (50 mg total) by mouth 2 (two) times daily. 10/11/20   Charlynne Cousins, MD  ticagrelor (BRILINTA) 90 MG TABS tablet Take 1 tablet (90 mg total) by mouth 2 (two) times daily. 10/11/20   Charlynne Cousins, MD    Family History    Family History  Problem Relation Age of Onset   Hypertension Mother    Hypertension Father    Hypertension Brother    Stroke Sister    She indicated that her mother is deceased. She indicated that her father is deceased. She indicated that her sister is alive. She indicated that her brother is deceased.   Social History    Social History   Socioeconomic History   Marital status: Single    Spouse name: Not on file   Number of children: 3   Years of education: Not on file   Highest education level: 12th grade  Occupational History   Not on file  Tobacco Use   Smoking status:  Some Days    Types: Cigars   Smokeless tobacco: Never  Vaping Use   Vaping Use: Never used  Substance and Sexual Activity   Alcohol use: Yes    Comment: social drinking   Drug use: Not Currently    Types: Marijuana, Cocaine    Comment: 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   Sexual activity: Not Currently    Birth control/protection: Post-menopausal  Other Topics Concern   Not on file  Social History Narrative   Not on file   Social Determinants of Health   Financial Resource Strain: High Risk (11/13/2020)   Overall Financial Resource Strain (CARDIA)    Difficulty of Paying Living Expenses: Hard  Food Insecurity: No Food Insecurity (03/04/2021)   Hunger Vital Sign    Worried About Running Out of Food in  the Last Year: Never true    [place] in the Last Year: Never true  Transportation Needs: No Transportation Needs (03/04/2021)   PRAPARE - Hydrologist (Medical): No    Lack of Transportation (Non-Medical): No  Physical Activity: Inactive (02/21/2017)   Exercise Vital Sign    Days of Exercise per Week: 0 days    Minutes of Exercise per Session: 0 min  Stress: No Stress Concern Present (02/21/2017)   [place]    Feeling of Stress : Not at all  Social Connections: Somewhat Isolated (02/21/2017)   Social Connection and Isolation Panel [NHANES]    Frequency of Communication with Friends and Family: More than three times a week    Frequency of Social Gatherings with Friends and Family: Once a week    Attends Religious Services: 1 to 4 times per year    Active Member of [employer] or Organizations: No    Attends Archivist Meetings: Never    Marital Status: Never married  Intimate Partner Violence: Not At Risk (02/21/2017)   Humiliation, Afraid, Rape, and Kick questionnaire    Fear of Current or Ex-Partner: No    Emotionally Abused: No    Physically Abused: No    Sexually Abused:  No     Review of Systems    General:  No chills, fever, night sweats or weight changes.  Cardiovascular:  No chest pain, dyspnea on exertion, edema, orthopnea, palpitations, paroxysmal nocturnal dyspnea. Dermatological: No rash, lesions/masses Respiratory: No cough, dyspnea Urologic: No hematuria, dysuria Abdominal:   No nausea, vomiting, diarrhea, bright red blood per rectum, melena, or hematemesis Neurologic:  No visual changes, wkns, changes in mental status. All other systems reviewed and are otherwise negative except as noted above.  Physical Exam    VS:  LMP  (LMP Unknown) Comment: Pt unsure why she no longer gets her period , BMI There is no height or weight on file to calculate BMI. GEN: Well nourished, well developed, in no acute distress. HEENT: normal. Neck: Supple, no JVD, carotid bruits, or masses. Cardiac: RRR, no murmurs, rubs, or gallops. No clubbing, cyanosis, edema.  Radials/DP/PT 2+ and equal bilaterally.  Respiratory:  Respirations regular and unlabored, clear to auscultation bilaterally. GI: Soft, nontender, nondistended, BS + x 4. MS: no deformity or atrophy. Skin: warm and dry, no rash. Neuro:  Strength and sensation are intact. Psych: Normal affect.  Accessory Clinical Findings    Recent Labs: 10/09/2020: B Natriuretic Peptide 304.5 10/10/2020: Hemoglobin 12.0; Platelets 170 03/03/2021: ALT 14 05/11/2021: BUN 13; Creatinine, Ser 1.07; Potassium 4.0; Sodium 145   Recent Lipid Panel    Component Value Date/Time   CHOL 156 03/03/2021 1132   TRIG 83 03/03/2021 1132   HDL 60 03/03/2021 1132   CHOLHDL 2.6 03/03/2021 1132   CHOLHDL 4.2 10/09/2020 1329   VLDL 20 10/09/2020 1329   LDLCALC 80 03/03/2021 1132    ECG personally reviewed by me today-none today***.  EKG 03/03/2021 sinus bradycardia LVH 57 bpm no ST or T wave deviation.   EKG 10/23/2020 normal sinus rhythm possible left atrial enlargement left ventricular hypertrophy 64 bpm- No acute  changes    Cardiac catheterization 10/09/2020  CONCLUSIONS: 95-99% mid RCA treated with overlapping drug-eluting stents postdilated to 3.5 mm in diameter.  TIMI grade III flow was maintained pre and post stenting. Intermediate stenosis in the mid LAD [place] 111 stenosis estimated EF 50% and  demonstrated to be hemodynamically insignificant with an RFR of 0.92. 70 to 80% mid to distal circumflex followed by 2 small obtuse marginal branches.  Medical therapy recommended. Left main is widely patent Acute on chronic systolic heart failure with EF 30 to 35%, LVEDP 20 mmHg.  Etiology is likely multifactorial (uncontrolled hypertension/substance abuse/ischemia). Recommendation:   Dual antiplatelet therapy for 12 months.  First month should be aspirin and Brilinta.  Okay to switch to Plavix and aspirin after 30 days. Guideline directed therapy for heart failure.  Metoprolol and losartan have been prescribed.  Metoprolol should be switched to the succinate preparation. Control of blood pressure.  IV nitroglycerin is temporarily being used while metoprolol and losartan are being initiated.  Patient ordinarily takes Catapres but it is unclear how frequent this occurs. Aggressive secondary prevention.  This includes avoidance of substance use/tobacco/medication noncompliance.  Diagnostic Dominance: Right Intervention    Assessment & Plan   1.    Jossie Ng. [place]ife Flight NP-C    07/18/2021, 1:39 PM [facility] [address] Office [phone] Fax [phone]  Notice: This dictation was prepared with Dragon dictation along with smaller phrase technology. Any transcriptional errors that result from this process are unintentional and may not be corrected upon review.  I spent ***14 minutes examining this patient, reviewing medications, and using patient centered shared decision making involving her cardiac care.  Prior to her visit I spent greater than 20 minutes  reviewing her past medical history,  medications, and prior cardiac tests.

## 2021-07-21 ENCOUNTER — Ambulatory Visit: Payer: No Typology Code available for payment source | Admitting: General Practice

## 2021-07-26 ENCOUNTER — Encounter: Payer: Self-pay | Admitting: Internal Medicine

## 2021-08-12 ENCOUNTER — Other Ambulatory Visit (HOSPITAL_COMMUNITY): Payer: Self-pay

## 2021-09-13 ENCOUNTER — Ambulatory Visit (INDEPENDENT_AMBULATORY_CARE_PROVIDER_SITE_OTHER): Payer: No Typology Code available for payment source

## 2021-09-13 DIAGNOSIS — I639 Cerebral infarction, unspecified: Secondary | ICD-10-CM

## 2021-09-13 LAB — CUP PACEART REMOTE DEVICE CHECK
Date Time Interrogation Session: 20230826231415
Implantable Pulse Generator Implant Date: 20200610

## 2021-10-07 NOTE — Progress Notes (Signed)
Carelink Summary Report / Loop Recorder 

## 2021-11-16 ENCOUNTER — Telehealth: Payer: Self-pay | Admitting: Internal Medicine

## 2021-11-16 NOTE — Telephone Encounter (Signed)
Called patient daughter, she states her mom is having issues getting medications. I see back in January they were in contact with Leslie Walker, but she was unable to reach anyone to discuss further.   She states she believes her mom is having issues because she is unable to get her medications. I advised we would need to call back after 3:00 PM today to get okay for Leslie Walker to speak with daughter about assistance for medications. I seen some DPR information, but did not see any specific for heartcare to speak with her without consent from patient. Daughter verbalized understanding.   Will go ahead and send to [place], and route back to triage to try and contact patient for consent. Thanks!

## 2021-11-16 NOTE — Telephone Encounter (Signed)
Pt c/o medication issue:  1. Name of Medication:   metoprolol tartrate (LOPRESSOR) 50 MG tablet  2. How are you currently taking this medication (dosage and times per day)? As prescribed  3. Are you having a reaction (difficulty breathing--STAT)?   No  4. What is your medication issue?   Daughter stated patient is unable to afford her medication and will need assistance getting medication.  Daughter stated patient has been having SOB off and on.

## 2021-11-16 NOTE — Telephone Encounter (Signed)
H&V Care Navigation CSW Progress Note  Clinical Social Worker  placed applications for [employer], [employer] PAP, and [employer] Card at from desk for pt or pt daughter  to pick up and return when complete. For more immediate medication assistance medications should be sent to [facility]) as that will be the most cost controlled option, pt may need Entresto samples if she still hasnt completed assistance application. Pt also no showed last appt with our team Denyse Amass, July 2023) and needs to get that rescheduled. I remain available.   Patient is participating in a Managed Medicaid Plan:  No, self pay only, not CAFA eligible, needs to complete [employer] application.   SDOH Screenings   Food Insecurity: No Food Insecurity (03/04/2021)  Housing: Low Risk  (11/13/2020)  Transportation Needs: No Transportation Needs (03/04/2021)  Financial Resource Strain: High Risk (11/13/2020)  Physical Activity: Inactive (02/21/2017)  Social Connections: Somewhat Isolated (02/21/2017)  Stress: No Stress Concern Present (02/21/2017)  Tobacco Use: High Risk (06/15/2021)    Westley Hummer, MSW, LCSW Clinical Social Worker [place]  [phone]- work cell phone (preferred) [phone]- desk phone

## 2021-11-17 LAB — CUP PACEART REMOTE DEVICE CHECK
Date Time Interrogation Session: 20231031231610
Implantable Pulse Generator Implant Date: 20200610

## 2021-11-17 NOTE — Telephone Encounter (Signed)
Called patient, spoke with her advising that paperwork was here at the office available for pick up from [place].   See note information below.    Patient advised she will come back to pick up.

## 2021-11-18 ENCOUNTER — Inpatient Hospital Stay (HOSPITAL_COMMUNITY)
Admission: EM | Admit: 2021-11-18 | Discharge: 2021-11-24 | DRG: 286 | Disposition: A | Discharge status: Home / Self Care | Payer: No Typology Code available for payment source | Attending: Internal Medicine | Admitting: Internal Medicine

## 2021-11-18 ENCOUNTER — Other Ambulatory Visit: Payer: Self-pay

## 2021-11-18 ENCOUNTER — Emergency Department (HOSPITAL_COMMUNITY): Payer: No Typology Code available for payment source

## 2021-11-18 ENCOUNTER — Encounter (HOSPITAL_COMMUNITY): Payer: Self-pay | Admitting: Radiology

## 2021-11-18 DIAGNOSIS — I10 Essential (primary) hypertension: Secondary | ICD-10-CM | POA: Diagnosis present

## 2021-11-18 DIAGNOSIS — F17290 Nicotine dependence, other tobacco product, uncomplicated: Secondary | ICD-10-CM | POA: Diagnosis present

## 2021-11-18 DIAGNOSIS — J9601 Acute respiratory failure with hypoxia: Secondary | ICD-10-CM | POA: Diagnosis present

## 2021-11-18 DIAGNOSIS — Z8673 Personal history of transient ischemic attack (TIA), and cerebral infarction without residual deficits: Secondary | ICD-10-CM

## 2021-11-18 DIAGNOSIS — R599 Enlarged lymph nodes, unspecified: Secondary | ICD-10-CM

## 2021-11-18 DIAGNOSIS — F1910 Other psychoactive substance abuse, uncomplicated: Secondary | ICD-10-CM | POA: Diagnosis present

## 2021-11-18 DIAGNOSIS — Z79899 Other long term (current) drug therapy: Secondary | ICD-10-CM

## 2021-11-18 DIAGNOSIS — Z597 Insufficient social insurance and welfare support: Secondary | ICD-10-CM

## 2021-11-18 DIAGNOSIS — I214 Non-ST elevation (NSTEMI) myocardial infarction: Secondary | ICD-10-CM

## 2021-11-18 DIAGNOSIS — I359 Nonrheumatic aortic valve disorder, unspecified: Secondary | ICD-10-CM

## 2021-11-18 DIAGNOSIS — I2489 Other forms of acute ischemic heart disease: Secondary | ICD-10-CM | POA: Diagnosis present

## 2021-11-18 DIAGNOSIS — I5023 Acute on chronic systolic (congestive) heart failure: Secondary | ICD-10-CM | POA: Diagnosis present

## 2021-11-18 DIAGNOSIS — I350 Nonrheumatic aortic (valve) stenosis: Secondary | ICD-10-CM | POA: Diagnosis present

## 2021-11-18 DIAGNOSIS — Z955 Presence of coronary angioplasty implant and graft: Secondary | ICD-10-CM

## 2021-11-18 DIAGNOSIS — F1410 Cocaine abuse, uncomplicated: Secondary | ICD-10-CM | POA: Diagnosis present

## 2021-11-18 DIAGNOSIS — I420 Dilated cardiomyopathy: Secondary | ICD-10-CM | POA: Diagnosis present

## 2021-11-18 DIAGNOSIS — Z91148 Patient's other noncompliance with medication regimen for other reason: Secondary | ICD-10-CM

## 2021-11-18 DIAGNOSIS — I2510 Atherosclerotic heart disease of native coronary artery without angina pectoris: Secondary | ICD-10-CM | POA: Diagnosis present

## 2021-11-18 DIAGNOSIS — I2729 Other secondary pulmonary hypertension: Secondary | ICD-10-CM | POA: Diagnosis present

## 2021-11-18 DIAGNOSIS — Z8249 Family history of ischemic heart disease and other diseases of the circulatory system: Secondary | ICD-10-CM

## 2021-11-18 DIAGNOSIS — R0609 Other forms of dyspnea: Principal | ICD-10-CM

## 2021-11-18 DIAGNOSIS — F1190 Opioid use, unspecified, uncomplicated: Secondary | ICD-10-CM | POA: Diagnosis present

## 2021-11-18 DIAGNOSIS — E785 Hyperlipidemia, unspecified: Secondary | ICD-10-CM | POA: Diagnosis present

## 2021-11-18 DIAGNOSIS — Z823 Family history of stroke: Secondary | ICD-10-CM

## 2021-11-18 DIAGNOSIS — Z7984 Long term (current) use of oral hypoglycemic drugs: Secondary | ICD-10-CM

## 2021-11-18 DIAGNOSIS — E7841 Elevated Lipoprotein(a): Secondary | ICD-10-CM | POA: Diagnosis present

## 2021-11-18 DIAGNOSIS — I110 Hypertensive heart disease with heart failure: Principal | ICD-10-CM | POA: Diagnosis present

## 2021-11-18 DIAGNOSIS — R7989 Other specified abnormal findings of blood chemistry: Secondary | ICD-10-CM

## 2021-11-18 HISTORY — DX: Chronic systolic (congestive) heart failure: I50.22

## 2021-11-18 LAB — CBC
HCT: 36.6 % (ref 36.0–46.0)
Hemoglobin: 13.3 g/dL (ref 12.0–15.0)
MCH: 29.7 pg (ref 26.0–34.0)
MCHC: 36.3 g/dL — ABNORMAL HIGH (ref 30.0–36.0)
MCV: 81.7 fL (ref 80.0–100.0)
Platelets: 354 10*3/uL (ref 150–400)
RBC: 4.48 MIL/uL (ref 3.87–5.11)
RDW: 13.5 % (ref 11.5–15.5)
WBC: 6.1 10*3/uL (ref 4.0–10.5)
nRBC: 0 % (ref 0.0–0.2)

## 2021-11-18 LAB — TROPONIN I (HIGH SENSITIVITY): Troponin I (High Sensitivity): 101 ng/L (ref <18)

## 2021-11-18 LAB — BASIC METABOLIC PANEL
Anion gap: 8 (ref 5–15)
BUN: 15 mg/dL (ref 6–20)
CO2: 21 mmol/L — ABNORMAL LOW (ref 22–32)
Calcium: 8.7 mg/dL — ABNORMAL LOW (ref 8.9–10.3)
Chloride: 108 mmol/L (ref 98–111)
Creatinine, Ser: 1 mg/dL (ref 0.44–1.00)
GFR, Estimated: >60 mL/min (ref >60)
Glucose, Bld: 133 mg/dL — ABNORMAL HIGH (ref 70–99)
Potassium: 4.1 mmol/L (ref 3.5–5.1)
Sodium: 137 mmol/L (ref 135–145)

## 2021-11-18 LAB — BRAIN NATRIURETIC PEPTIDE: B Natriuretic Peptide: 2512 pg/mL — ABNORMAL HIGH (ref 0.0–100.0)

## 2021-11-18 MED ORDER — IOHEXOL 350 MG/ML SOLN
50.0000 mL | Freq: Once | INTRAVENOUS | Status: AC | PRN
  Administered 2021-11-18: 50 mL via INTRAVENOUS

## 2021-11-18 MED ORDER — ACETAMINOPHEN 325 MG PO TABS: 650.0000 mg | ORAL_TABLET | Freq: Four times a day (QID) | ORAL | Status: DC | PRN

## 2021-11-18 MED ORDER — FUROSEMIDE 10 MG/ML IJ SOLN
40.0000 mg | Freq: Once | INTRAMUSCULAR | Status: AC
  Administered 2021-11-18: 40 mg via INTRAVENOUS
  Filled 2021-11-18: qty 4

## 2021-11-18 MED ORDER — ACETAMINOPHEN 650 MG RE SUPP: 650.0000 mg | Freq: Four times a day (QID) | RECTAL | Status: DC | PRN

## 2021-11-18 MED ORDER — FUROSEMIDE 10 MG/ML IJ SOLN
40.0000 mg | Freq: Two times a day (BID) | INTRAMUSCULAR | Status: DC
  Administered 2021-11-19: 40 mg via INTRAVENOUS
  Filled 2021-11-18: qty 4

## 2021-11-18 NOTE — Telephone Encounter (Signed)
Daughter calling to see if the forms can be mailed to the patient. Please advise

## 2021-11-18 NOTE — ED Notes (Signed)
Troponin 101, Dr. Laverta Baltimore notified

## 2021-11-18 NOTE — ED Provider Notes (Signed)
[facility] DEPARTMENT Provider Note   CSN: [id number] Arrival date & time: 11/18/21  1045     History  Chief Complaint  Patient presents with   Shortness of Breath    Leslie Walker is a 56 y.o. female.   Shortness of Breath   Has been getting short of breath for the last week or so. Mostly had SOB with activity. Patient denies any coughing, fever, and has otherwise been in her normal state of health. Denies any chest pain or belly pain. Reports that she can talk normally without breathlessness. Hx of smoking for 33 years but has stopped smoking about 2 weeks ago. SOB usually resolves after 5-10 min rest, when activated. Hx of recreational drug use.    Med Hx of HTN, HLD, TIA,      Home Medications Prior to Admission medications   Medication Sig Start Date End Date Taking? Authorizing Provider  aspirin 81 MG EC tablet Take 1 tablet (81 mg total) by mouth daily. 10/11/20   Charlynne Cousins, MD  atorvastatin (LIPITOR) 40 MG tablet Take 1 tablet (40 mg total) by mouth daily at 6 PM. 06/15/21   Cleaver, Jossie Ng, NP  clopidogrel (PLAVIX) 75 MG tablet Take 1 tablet (75 mg total) by mouth daily. 06/15/21   Deberah Pelton, NP  ezetimibe (ZETIA) 10 MG tablet Take 1 tablet (10 mg total) by mouth daily. 03/16/21 06/14/21  Deberah Pelton, NP  furosemide (LASIX) 20 MG tablet Take 20 mg by mouth daily. 06/27/20   [provider]  gabapentin (NEURONTIN) 100 MG capsule Take 100 mg by mouth 3 (three) times daily as needed (nerve pain). 04/22/20   [provider]  metoprolol tartrate (LOPRESSOR) 50 MG tablet Take 1 tablet (50 mg total) by mouth 2 (two) times daily. 10/11/20   Charlynne Cousins, MD  sacubitril-valsartan (ENTRESTO) 97-103 MG Take 1 tablet by mouth 2 (two) times daily. 05/11/21   Deberah Pelton, NP      Allergies    Patient has no known allergies.    Review of Systems   Review of Systems  Respiratory:  Positive for shortness  of breath.     Physical Exam Updated Vital Signs BP (!) 147/86   Pulse 98   Temp 98.2 F (36.8 C) (Oral)   Resp 19   LMP  (LMP Unknown) Comment: Pt unsure why she no longer gets her period  SpO2 90%  Physical Exam Constitutional:      Appearance: She is well-developed.  HENT:     Mouth/Throat:     Mouth: Mucous membranes are moist.     Pharynx: Oropharynx is clear. No pharyngeal swelling or oropharyngeal exudate.  Cardiovascular:     Rate and Rhythm: Normal rate and regular rhythm.     Pulses: Normal pulses.     Heart sounds: Normal heart sounds. No murmur heard.    No friction rub. No gallop.  Pulmonary:     Effort: Pulmonary effort is normal. No tachypnea, accessory muscle usage or respiratory distress.     Breath sounds: Normal breath sounds. No stridor. No decreased breath sounds, wheezing, rhonchi or rales.  Chest:     Chest wall: No tenderness.  Lymphadenopathy:     Cervical: No cervical adenopathy.  Skin:    Capillary Refill: Capillary refill takes less than 2 seconds.  Neurological:     Mental Status: She is alert.     ED Results / Procedures / Treatments  Labs (all labs ordered are listed, but only abnormal results are displayed) Labs Reviewed  BASIC METABOLIC PANEL - Abnormal; Notable for the following components:      Result Value   CO2 21 (*)    Glucose, Bld 133 (*)    Calcium 8.7 (*)    All other components within normal limits  CBC - Abnormal; Notable for the following components:   MCHC 36.3 (*)    All other components within normal limits    EKG None  Radiology DG Chest 2 View  Result Date: 11/18/2021 CLINICAL DATA:  Shortness of breath, altered mental status EXAM: CHEST - 2 VIEW COMPARISON:  Chest radiograph and CT 10/06/2020 FINDINGS: A loop recorder is again seen. The heart is mildly enlarged. The upper mediastinal contours are normal. There is vascular congestion without definite overt pulmonary edema. There are small bilateral pleural  effusions with adjacent atelectasis. There is hazy opacity in the left upper lobe. There is no pneumothorax There is no acute osseous abnormality. IMPRESSION: 1. Hazy opacity in the left upper lobe suspicious for pneumonia and small bilateral pleural effusions. Recommend follow-up radiographs in 3-4 weeks to ensure resolution 2. Mild cardiomegaly. Electronically Signed   By: Valetta Mole M.D.   On: 11/18/2021 11:45   CUP PACEART REMOTE DEVICE CHECK  Result Date: 11/17/2021 ILR summary report received. Battery status OK. Normal device function. No new symptom, brady, or pause episodes. Monthly summary reports and ROV/PRN 54 tachy events, these have been false due to oversensing 16 AF events, longest duration ~25mn, these have also been false, longer episode was routed, [place]   Procedures Procedures    Medications Ordered in ED Medications - No data to display  ED Course/ Medical Decision Making/ A&P                           Medical Decision Making This patient presents to the ED for concern of dyspnea on exertion, this involves an extensive number of treatment options, and is a complaint that carries with it a high risk of complications and morbidity.  DDX considered PNA, PE, CHF, COPD, Asthma   Co morbidities that complicate the patient evaluation       Patient with Hx of recreational drug use   Lab Tests:  I Ordered, and personally interpreted labs.  The pertinent results include:  elevated BNP and elevated troponin, concerning for CHF.    Imaging Studies ordered:  I ordered imaging studies including CXR and CTA chest I independently visualized and interpreted imaging which showed Signs of CHF with pleural effusions. I agree with the radiologist interpretation   Cardiac Monitoring/ECG:       The patient was maintained on a cardiac monitor.  I personally viewed and interpreted the cardiac monitored which showed a normal ECG.   Medicines ordered and prescription drug  management:  I ordered medication including Medications acetaminophen (TYLENOL) tablet 650 mg (has no administration in time range)   Or acetaminophen (TYLENOL) suppository 650 mg (has no administration in time range) furosemide (LASIX) injection 40 mg (has no administration in time range) ezetimibe (ZETIA) tablet 10 mg (has no administration in time range) iohexol (OMNIPAQUE) 350 MG/ML injection 50 mL (50 mLs Intravenous Contrast Given 11/18/21 1340) furosemide (LASIX) injection 40 mg (40 mg Intravenous Given 11/18/21 1437)  Reevaluation of the patient after these medicines showed that the patient improved   Problem List / ED Course:       (  R06.09) DOE (dyspnea on exertion)  (primary encounter diagnosis)  (R59.9) Adenopathy     Reevaluation:  After the interventions noted above, I reevaluated the patient and found that they have :improved   Dispostion:  After consideration of the diagnostic results and the patients response to treatment, I feel that the patent would benefit from admission for CHF exacerbation.    Amount and/or Complexity of Data Reviewed Labs: ordered.    Details: CBC and BMP WNL, Trop elevated to 101, BNP elevated to 2,512, likely in setting of HF Radiology: ordered and independent interpretation performed.    Details: CXR showed small concern for PNA with pleural effusions and cardiomegaly, CTPA chest showed no evidence of PE, but did show moderate CHF with bilateral pleural effusions.     Risk Prescription drug management. Decision regarding hospitalization.          Final Clinical Impression(s) / ED Diagnoses Final diagnoses:  None    Rx / DC Orders ED Discharge Orders     None         Holley Bouche, MD 11/19/21 1062    Margette Fast, MD 11/22/21 [phone]

## 2021-11-18 NOTE — H&P (Signed)
History and Physical    PLEASE NOTE THAT DRAGON DICTATION SOFTWARE WAS USED IN THE CONSTRUCTION OF THIS NOTE.   Leslie Walker FXT:[id number] DOB: 10-19-1965 DOA: 11/18/2021  PCP: Elbert Ewings, FNP  Patient coming from: home   I have personally briefly reviewed patient's old medical records in [place]  Chief Complaint: Shortness of breath  HPI: Leslie Walker is a 56 y.o. female with medical history significant for chronic systolic heart failure, with most recent LVEF 45 to 50%, essential hypertension, hyperlipidemia, who is admitted to [facility] on 11/18/2021 with acute on chronic systolic heart failure after presenting from home to [facility] ED complaining of shortness of breath.   The patient reports 1 week of progressive shortness of breath associate with orthopnea, worsening of edema in the bilateral lower extremities.  She denies any associated chest pain, palpitations, diaphoresis, dizziness, presyncope, or syncope.  No recent nausea, vomiting, diarrhea.  Denies any recent new onset calf tenderness or new onset lower extremity erythema.  No cough, subjective fever, chills, rigors, or generalized myalgias.  No recent mopped assist.  No recent trauma.  She has a documented history of chronic systolic heart failure, with most recent echocardiogram, which was performed in September 2022, notable for LVEF 45 to 50%, will global hypokinesis of the left ventricle, moderately dilated left ventricular internal cavity size, severe LVH, indeterminate diastolic parameters, normal right ventricular systolic function.  The patient reports that her previously prescribed antihypertensive medications for chronic systolic heart failure have been stopped, for unclear reasons, and that her blood pressure is now managed via lifestyle modifications.  She likewise reports that she is no longer on her previous diuretic medication, which was noted to be Lasix 20 mg p.o. daily.  At this time.  She  reports that she is on no diuretic regimen as an outpatient.  Per chart review, she also has a documented history of intermittent cocaine use, but denies any recent cocaine or additional stimulant use leading up to this evening's presentation.  Was previously hospitalized in September 2022 for acute on chronic systolic heart failure.  During that previous hospitalization, her high-sensitivity troponin I was noted to have peaked at 100, with the final high-sensitivity troponin I value during that hospitalization noted to be 73 on 10/09/2020.      ED Course:  Vital signs in the ED were notable for the following: Afebrile; initial heart rate 106, with heart rate subsequently decreasing into the 80s following interval initiation of IV Lasix, as further quantified below; systolic blood pressures initially in the 150s to 160s, subsequent improving into the 120s 130s following initiation of IV diuresis efforts; respiratory rate 16-25, oxygen saturation 94 to 99% on room air.  Labs were notable for the following: CMP notable for sodium 137, potassium 4.1, creatinine 1.0.  BNP 2500 compared to most recent prior value of 3-4 in September 2022.  High-sensitivity troponin I 101.  CBC notable for white blood cell count 6100, hemoglobin 13.3.  Imaging and additional notable ED work-up: EKG shows sinus tachycardia with heart rate 105, normal intervals, nonspecific T wave inversion in leads V5/V6, and no evidence of ST changes, including no evidence of ST elevation.  CTA chest with pulmonary embolism protocol showed no evidence of acute pulmonary embolism, while showing evidence of interstitial edema as well as small bilateral pleural effusions, in the absence of evidence of infiltrate, or pneumothorax.  While in the ED, the following were administered: Lasix 40 mg IV x1 dose.  Subsequently, the  patient was admitted for further evaluation and management of acute on chronic systolic heart failure.    Review of  Systems: As per HPI otherwise 10 point review of systems negative.   Past Medical History:  Diagnosis Date   Cocaine abuse ([place])    Dyslipidemia    Hypertension    Noncompliance w/medication treatment due to intermit use of medication    Stroke [facility]) 2021   no deficits    Past Surgical History:  Procedure Laterality Date   CORONARY STENT INTERVENTION N/A 10/09/2020   Procedure: CORONARY STENT INTERVENTION;  Surgeon: Belva Crome, MD;  Location: [place] [facility];  Service: Cardiovascular;  Laterality: N/A;   INTRAVASCULAR PRESSURE WIRE/FFR STUDY N/A 10/09/2020   Procedure: INTRAVASCULAR PRESSURE WIRE/FFR STUDY;  Surgeon: Belva Crome, MD;  Location: [place] [facility];  Service: Cardiovascular;  Laterality: N/A;   LEFT HEART CATH AND CORONARY ANGIOGRAPHY N/A 10/09/2020   Procedure: LEFT HEART CATH AND CORONARY ANGIOGRAPHY;  Surgeon: Belva Crome, MD;  Location: [place] [facility];  Service: Cardiovascular;  Laterality: N/A;   LOOP RECORDER INSERTION N/A 06/27/2018   Procedure: LOOP RECORDER INSERTION;  Surgeon: Constance Haw, MD;  Location: [place] [facility];  Service: Cardiovascular;  Laterality: N/A;    Social History:  reports that she has been smoking cigars. She has never used smokeless tobacco. She reports current alcohol use. She reports that she does not currently use drugs after having used the following drugs: Marijuana and Cocaine.   No Known Allergies  Family History  Problem Relation Age of Onset   Hypertension Mother    Hypertension Father    Hypertension Brother    Stroke Sister     Family history reviewed and not pertinent    Prior to Admission medications   Medication Sig Start Date End Date Taking? Authorizing Provider  aspirin 81 MG EC tablet Take 1 tablet (81 mg total) by mouth daily. Patient not taking: Reported on 11/18/2021 10/11/20   Charlynne Cousins, MD  atorvastatin (LIPITOR) 40 MG tablet Take 1 tablet (40 mg total) by mouth daily at  6 PM. Patient not taking: Reported on 11/18/2021 06/15/21   Deberah Pelton, NP  clopidogrel (PLAVIX) 75 MG tablet Take 1 tablet (75 mg total) by mouth daily. Patient not taking: Reported on 11/18/2021 06/15/21   Deberah Pelton, NP  ezetimibe (ZETIA) 10 MG tablet Take 1 tablet (10 mg total) by mouth daily. 03/16/21 06/14/21  Deberah Pelton, NP  furosemide (LASIX) 20 MG tablet Take 20 mg by mouth daily. Patient not taking: Reported on 11/18/2021 06/27/20   [provider]  gabapentin (NEURONTIN) 100 MG capsule Take 100 mg by mouth 3 (three) times daily as needed (nerve pain). Patient not taking: Reported on 11/18/2021 04/22/20   [provider]  metoprolol tartrate (LOPRESSOR) 50 MG tablet Take 1 tablet (50 mg total) by mouth 2 (two) times daily. Patient not taking: Reported on 11/18/2021 10/11/20   Charlynne Cousins, MD  sacubitril-valsartan (ENTRESTO) 97-103 MG Take 1 tablet by mouth 2 (two) times daily. Patient not taking: Reported on 11/18/2021 05/11/21   Deberah Pelton, NP     Objective    Physical Exam: Vitals:   11/18/21 1830 11/18/21 1945 11/18/21 1958 11/18/21 2057  BP: (!) 127/98   127/74  Pulse: 79 84 81 90  Resp: (!) '29 20 13 13  '$ Temp:      TempSrc:      SpO2: 94%  94% 96% 95%    General: appears to be stated age; alert, oriented; mildly increased work of breathing noted Skin: warm, dry, no rash Head:  AT/[place] Mouth:  Oral mucosa membranes appear moist, normal dentition Neck: supple; trachea midline Heart:  RRR; did not appreciate any M/R/G Lungs: CTAB, did not appreciate any wheezes, rales, or rhonchi Abdomen: + BS; soft, ND, NT Vascular: 2+ pedal pulses b/l; 2+ radial pulses b/l Extremities: Trace edema bilateral lower extremities, no muscle wasting Neuro: strength and sensation intact in upper and lower extremities b/l without    Labs on Admission: I have personally reviewed following labs and imaging studies  CBC: Recent Labs  Lab  11/18/21 1107  WBC 6.1  HGB 13.3  HCT 36.6  MCV 81.7  PLT 595   Basic Metabolic Panel: Recent Labs  Lab 11/18/21 1107  NA 137  K 4.1  CL 108  CO2 21*  GLUCOSE 133*  BUN 15  CREATININE 1.00  CALCIUM 8.7*   GFR: CrCl cannot be calculated (Unknown ideal weight.). Liver Function Tests: No results for input(s): "AST", "ALT", "ALKPHOS", "BILITOT", "PROT", "ALBUMIN" in the last 168 hours. No results for input(s): "LIPASE", "AMYLASE" in the last 168 hours. No results for input(s): "AMMONIA" in the last 168 hours. Coagulation Profile: No results for input(s): "INR", "PROTIME" in the last 168 hours. Cardiac Enzymes: No results for input(s): "CKTOTAL", "CKMB", "CKMBINDEX", "TROPONINI" in the last 168 hours. BNP (last 3 results) No results for input(s): "PROBNP" in the last 8760 hours. HbA1C: No results for input(s): "HGBA1C" in the last 72 hours. CBG: No results for input(s): "GLUCAP" in the last 168 hours. Lipid Profile: No results for input(s): "CHOL", "HDL", "LDLCALC", "TRIG", "CHOLHDL", "LDLDIRECT" in the last 72 hours. Thyroid Function Tests: No results for input(s): "TSH", "T4TOTAL", "FREET4", "T3FREE", "THYROIDAB" in the last 72 hours. Anemia Panel: No results for input(s): "VITAMINB12", "FOLATE", "FERRITIN", "TIBC", "IRON", "RETICCTPCT" in the last 72 hours. Urine analysis:    Component Value Date/Time   COLORURINE YELLOW 06/27/2018 1630   APPEARANCEUR CLEAR 06/27/2018 1630   LABSPEC 1.024 06/27/2018 1630   PHURINE 6.0 06/27/2018 1630   GLUCOSEU NEGATIVE 06/27/2018 1630   HGBUR SMALL (A) 06/27/2018 1630   BILIRUBINUR NEGATIVE 06/27/2018 1630   KETONESUR NEGATIVE 06/27/2018 1630   PROTEINUR 100 (A) 06/27/2018 1630   NITRITE NEGATIVE 06/27/2018 [place] 06/27/2018 1630    Radiological Exams on Admission: CT Angio Chest PE W and/or Wo Contrast  Result Date: 11/18/2021 CLINICAL DATA:  Shortness of breath for the past week. EXAM: CT  ANGIOGRAPHY CHEST WITH CONTRAST TECHNIQUE: Multidetector CT imaging of the chest was performed using the standard protocol during bolus administration of intravenous contrast. Multiplanar CT image reconstructions and MIPs were obtained to evaluate the vascular anatomy. RADIATION DOSE REDUCTION: This exam was performed according to the departmental dose-optimization program which includes automated exposure control, adjustment of the mA and/or kV according to patient size and/or use of iterative reconstruction technique. CONTRAST:  33m OMNIPAQUE IOHEXOL 350 MG/ML SOLN COMPARISON:  11/18/2021 chest radiograph.  CTA chest 10/09/2020 FINDINGS: Cardiovascular: The quality of this exam for evaluation of pulmonary embolism is good. The bolus is well timed. No evidence of pulmonary embolism. Aortic atherosclerosis. The aorta is not opacified, so dissection cannot be evaluated for excluded. No aortic aneurysm. Moderate cardiomegaly, without pericardial effusion. Three vessel coronary artery calcification. Mediastinum/Nodes: Multiple small low jugular/supraclavicular nodes. No middle mediastinal adenopathy. Mild right infrahilar adenopathy at 11 mm on 73/5 is new.  Left infrahilar adenopathy at 1.0 cm on 61/5 is also new. Prevascular adenopathy at 1.7 cm on 43/5, new. Lungs/Pleura: Small bilateral pleural effusions, slightly larger on the right. Diffuse ground-glass opacity with smooth septal thickening, consistent with interstitial edema. This is most significant in the left upper lobe, accounting for the plain film abnormality. Upper Abdomen: Normal imaged portions of the liver, stomach. Musculoskeletal: No acute osseous abnormality. Review of the MIP images confirms the above findings. IMPRESSION: 1.  No evidence of pulmonary embolism. 2. Moderate congestive heart failure with small bilateral pleural effusions. 3. Development of mild thoracic adenopathy, most significant in the prevascular station. Most likely related to  congestive failure. This could be re-evaluated with contrast-enhanced chest CT at 3-6 months to confirm resolution. 4. Age advanced coronary artery atherosclerosis. Recommend assessment of coronary risk factors. 5.  Aortic Atherosclerosis (ICD10-I70.0). Electronically Signed   By: Abigail Miyamoto M.D.   On: 11/18/2021 13:52   DG Chest 2 View  Result Date: 11/18/2021 CLINICAL DATA:  Shortness of breath, altered mental status EXAM: CHEST - 2 VIEW COMPARISON:  Chest radiograph and CT 10/06/2020 FINDINGS: A loop recorder is again seen. The heart is mildly enlarged. The upper mediastinal contours are normal. There is vascular congestion without definite overt pulmonary edema. There are small bilateral pleural effusions with adjacent atelectasis. There is hazy opacity in the left upper lobe. There is no pneumothorax There is no acute osseous abnormality. IMPRESSION: 1. Hazy opacity in the left upper lobe suspicious for pneumonia and small bilateral pleural effusions. Recommend follow-up radiographs in 3-4 weeks to ensure resolution 2. Mild cardiomegaly. Electronically Signed   By: Valetta Mole M.D.   On: 11/18/2021 11:45   CUP PACEART REMOTE DEVICE CHECK  Result Date: 11/17/2021 ILR summary report received. Battery status OK. Normal device function. No new symptom, brady, or pause episodes. Monthly summary reports and ROV/PRN 54 tachy events, these have been false due to oversensing 16 AF events, longest duration ~70mn, these have also been false, longer episode was routed, no OAC LA    EKG: Independently reviewed, with result as described above.    Assessment/Plan   Principal Problem:   Acute on chronic systolic heart failure (HCC) Active Problems:   Essential hypertension   Hyperlipidemia   Elevated troponin      #) Acute on chronic systolic heart failure: dx of acute decompensation on the basis of presenting 1 week of progressive shortness of breath associate with orthopnea, worsening of edema  in the bilateral lower extremities, with interval elevation in BNP, as well as CTA chest showing interval development of interstitial edema as well as bilateral pleural effusions. This is in the context of a known history of chronic systolic heart failure, with most recent echocardiogram performed in September 2022, with LVEF at that time noted to be 45 to 50%, with additional details as conveyed above.   Etiology leading to presenting acutely decompensated heart failure is suspected to stem from suboptimal outpatient cardiac regimen, with the patient reporting that she is no longer on her Entresto, eta-blocker, or any diuretic medication as an outpatient.  This is superimposed on the patient is at risk for worsening of heart failure as a consequence of her previously noted severe LVH and dilated cardiomyopathy.  Differential also includes the possibility of cocaine abuse, given a documented history of previous such use.  I suspect that mildly elevated initial troponin is a consequence of underlying acutely decompensated heart failure as opposed to representing ACS causing presenting acute  heart failure exacerbation, particularly in the absence of any recent CP, and with presenting EKG showing no evidence of acute ischemic changes. However, will continue to evaluate with further trending of troponin and close monitoring on tele.   Of note, patient received Lasix 40 mg IV x1 while in the ED today. Presentation warrants additional IV diuresis, as further detailed below, with close monitoring of ensuing renal function, electrolytes, and volume status.    Plan: monitor strict I's & O's and daily weights. Monitor on telemetry, including trend in HR in response to diuresis, as above. Monitor continuous pulse oximetry. Repeat BMP in the morning, including for monitoring trend of potassium, bicarbonate, and renal function in response to interval diuresis efforts. Add-on serum magnesium level, and repeat this level  in the AM. Close monitoring of ensuing blood pressure response to diuresis efforts, including to help guide need for improvement in afterload reduction in order to optimize cardiac output. Trend troponin. Lasix 40 mg IV twice daily.  echocardiogram ordered for the morning.  Trend troponin.  Check urinary drug screen.             #) Essential Hypertension: documented h/o such, which the patient reports is now managed via lifestyle modifications after apparent independent discontinuation of her multiple home antihypertensive medications include Entresto, beta-blocker, Lasix.  Plan: Close monitoring of subsequent BP via routine VS, with potential ultimate resumption of these antihypertensive medications in the context of her chronic systolic heart failure.                #) Hyperlipidemia: documented h/o such. On Zetia as outpatient.    Plan: continue home Zetia.       DVT prophylaxis: SCD's   Code Status: Full code Family Communication: none Disposition Plan: Per Rounding Team Consults called: none;  Admission status: inpatient    PLEASE NOTE THAT DRAGON DICTATION SOFTWARE WAS USED IN THE CONSTRUCTION OF THIS NOTE.   [place] DO [facility]  From [place]   11/18/2021, 9:14 PM

## 2021-11-18 NOTE — ED Triage Notes (Signed)
Pt. Stated, Leslie Walker been SOB for the last week and Im losing my voice.

## 2021-11-19 ENCOUNTER — Inpatient Hospital Stay (HOSPITAL_COMMUNITY): Payer: No Typology Code available for payment source

## 2021-11-19 ENCOUNTER — Other Ambulatory Visit (HOSPITAL_COMMUNITY): Payer: Self-pay

## 2021-11-19 ENCOUNTER — Encounter (HOSPITAL_COMMUNITY): Payer: Self-pay | Admitting: Internal Medicine

## 2021-11-19 DIAGNOSIS — F1910 Other psychoactive substance abuse, uncomplicated: Secondary | ICD-10-CM

## 2021-11-19 DIAGNOSIS — I509 Heart failure, unspecified: Secondary | ICD-10-CM | POA: Insufficient documentation

## 2021-11-19 DIAGNOSIS — R7989 Other specified abnormal findings of blood chemistry: Secondary | ICD-10-CM | POA: Insufficient documentation

## 2021-11-19 DIAGNOSIS — I5021 Acute systolic (congestive) heart failure: Secondary | ICD-10-CM

## 2021-11-19 LAB — CBC WITH DIFFERENTIAL/PLATELET
Abs Immature Granulocytes: 0.01 10*3/uL (ref 0.00–0.07)
Basophils Absolute: 0.1 10*3/uL (ref 0.0–0.1)
Basophils Relative: 1 %
Eosinophils Absolute: 0.2 10*3/uL (ref 0.0–0.5)
Eosinophils Relative: 5 %
HCT: 33.3 % — ABNORMAL LOW (ref 36.0–46.0)
Hemoglobin: 12 g/dL (ref 12.0–15.0)
Immature Granulocytes: 0 %
Lymphocytes Relative: 53 %
Lymphs Abs: 2.8 10*3/uL (ref 0.7–4.0)
MCH: 29.9 pg (ref 26.0–34.0)
MCHC: 36 g/dL (ref 30.0–36.0)
MCV: 83 fL (ref 80.0–100.0)
Monocytes Absolute: 0.4 10*3/uL (ref 0.1–1.0)
Monocytes Relative: 7 %
Neutro Abs: 1.8 10*3/uL (ref 1.7–7.7)
Neutrophils Relative %: 34 %
Platelets: 303 10*3/uL (ref 150–400)
RBC: 4.01 MIL/uL (ref 3.87–5.11)
RDW: 13.9 % (ref 11.5–15.5)
WBC: 5.3 10*3/uL (ref 4.0–10.5)
nRBC: 0 % (ref 0.0–0.2)

## 2021-11-19 LAB — ECHOCARDIOGRAM COMPLETE
AR max vel: 1.37 cm2
AV Area VTI: 1.31 cm2
AV Area mean vel: 1.28 cm2
AV Mean grad: 15 mmHg
AV Peak grad: 28.3 mmHg
Ao pk vel: 2.66 m/s
Area-P 1/2: 6.71 cm2
Calc EF: 23.2 %
MV M vel: 3.06 m/s
MV Peak grad: 37.5 mmHg
P 1/2 time: 418 msec
S' Lateral: 5.3 cm
Single Plane A2C EF: 29.9 %
Single Plane A4C EF: 23.1 %

## 2021-11-19 LAB — COMPREHENSIVE METABOLIC PANEL
ALT: 18 U/L (ref 0–44)
AST: 26 U/L (ref 15–41)
Albumin: 2.8 g/dL — ABNORMAL LOW (ref 3.5–5.0)
Alkaline Phosphatase: 53 U/L (ref 38–126)
Anion gap: 5 (ref 5–15)
BUN: 13 mg/dL (ref 6–20)
CO2: 27 mmol/L (ref 22–32)
Calcium: 8.1 mg/dL — ABNORMAL LOW (ref 8.9–10.3)
Chloride: 106 mmol/L (ref 98–111)
Creatinine, Ser: 1.02 mg/dL — ABNORMAL HIGH (ref 0.44–1.00)
GFR, Estimated: >60 mL/min (ref >60)
Glucose, Bld: 107 mg/dL — ABNORMAL HIGH (ref 70–99)
Potassium: 3.7 mmol/L (ref 3.5–5.1)
Sodium: 138 mmol/L (ref 135–145)
Total Bilirubin: 0.4 mg/dL (ref 0.3–1.2)
Total Protein: 5.4 g/dL — ABNORMAL LOW (ref 6.5–8.1)

## 2021-11-19 LAB — TROPONIN I (HIGH SENSITIVITY): Troponin I (High Sensitivity): 80 ng/L — ABNORMAL HIGH (ref <18)

## 2021-11-19 LAB — MAGNESIUM: Magnesium: 2 mg/dL (ref 1.7–2.4)

## 2021-11-19 MED ORDER — POTASSIUM CHLORIDE CRYS ER 20 MEQ PO TBCR: 40.0000 meq | EXTENDED_RELEASE_TABLET | Freq: Once | ORAL | Status: DC

## 2021-11-19 MED ORDER — FUROSEMIDE 10 MG/ML IJ SOLN
60.0000 mg | Freq: Two times a day (BID) | INTRAMUSCULAR | Status: DC
  Administered 2021-11-19 – 2021-11-20 (×2): 60 mg via INTRAVENOUS
  Filled 2021-11-19 (×2): qty 6

## 2021-11-19 MED ORDER — EZETIMIBE 10 MG PO TABS
10.0000 mg | ORAL_TABLET | Freq: Every day | ORAL | Status: DC
  Administered 2021-11-20 – 2021-11-24 (×5): 10 mg via ORAL
  Filled 2021-11-19 (×5): qty 1

## 2021-11-19 NOTE — ED Notes (Signed)
Admitting provider at bedside to evaluate patient and update her on plan of care.

## 2021-11-19 NOTE — Assessment & Plan Note (Signed)
Patient with history of cocaine use. Will need support as outpatient.

## 2021-11-19 NOTE — ED Notes (Signed)
Pt on bipap; VSS at this time.

## 2021-11-19 NOTE — ED Notes (Addendum)
The patient left her room and went to the restroom located outside of her room. While in the bathroom, the patient pulled the distress cord in the restroom. This EMT went in and observed the patient in obvious respiratory distress. The patient appeared to be diaphoretic, clammy with labored, shallow, and rapid breathing. This EMT secured a wheelchair and maneuvered the patient back to her bed and called for assistance from the nurse. This EMT then placed a non-rebreather mask on the patient's face while the RN called for Respiratory Therapy as well as the [facility] zone MD. The patient was but on BiPAP and the proper medications were given by the RN as charted. This EMT also performed an EKG and presented same to the [facility] Zone. The patient is now resting more comfortably. The [facility] Zone MD informed this EMT that the patient's admitting provider was contacted and notified of the incident. Staff will continue to monitor the patient's condition.

## 2021-11-19 NOTE — Progress Notes (Signed)
  Progress Note   Patient: Leslie Walker YCX:[id number] DOB: December 05, 1965 DOA: 11/18/2021     1 DOS: the patient was seen and examined on 11/19/2021   Brief hospital course: Leslie Walker was admitted to the hospital with the working diagnosis of heart failure decompensation.   56 yo female with the past medical history of heart failure,  hypertension, and dyslipidemia who presented with dyspnea. Reported one week of worsening symptoms, associated with orthopnea and lower extremity edema. Patient has been off antihypertensive medications at home, including diuretics. Has been using cocaine intermittently. On her initial physical examination her blood pressure was 127/98, HR 79, RR 29 and 02 saturation 94%, lungs with no wheezing or rhonchi, abdomen with no distention, positive lower extremity edema.   Na 137, K 4,1 Cl 108, bicarbonate 21, glucose 133, bun 14 cr 1,0 BNP 2,512  High sensitive troponin 101, 80  Wbc 6,1 hgb 13,3 plt 354   Chest radiograph with cardiomegaly, bilateral hilar vascular congestion, bilateral interstitial infiltrates, with small bilateral pleural effusions. Cephalization of the vasculatures.   CT chest with bilateral ground glass opacities, small bilateral pleural effusions, more right than left.   EKG 106 bpm, normal axis, normal intervals, sinus rhythm with left atrial enlargement, with poor R R wave progression, with no ST segment changes, negative T wave V5 and V6.   Patient was placed on diuresis and supplemental 02 per [place]  11/03 worsening dyspnea and worsening pulmonary edema, required non invasive mechanical ventilation Bipap (while still in the ED).      Assessment and Plan: * Acute on chronic systolic heart failure (HCC) Echocardiogram from 2022 with mild reduction in systolic function, with mild aortic stenosis.   Patient continue with significant volume overload. Systolic blood pressure 497 to 124 mmHg Warm lower extremities.  Troponin is trending down,  elevation due to heart failure decompensation.   Continue diuresis with furosemide, increase dose to 60 mg IV q12 hr Follow up on new echocardiogram Add 40 meq Kcl to prevent hypokalemia.  Will need guideline directed medical therapy for heart failure, when improved volume satus.  Acute hypoxemic respiratory failure due to acute cardiogenic pulmonary edema. Plan to continue non invasive mechanical ventilation Bipap 12/8 with 40% fi02 Continue diuresis and keep 02 saturation 92% or greater.  Essential hypertension Blood pressure has improved, continue diuresis for now Plan to add heart failure guideline medical therapy during this hospitalization.   Hyperlipidemia Check lipide profile.   Polysubstance abuse [facility]) Patient with history of cocaine use. Will need support as outpatient.         Subjective: Patient continue to have dyspnea, now on Bipap,   Physical Exam: Vitals:   11/19/21 1057 11/19/21 1200 11/19/21 1215 11/19/21 1301  BP:  111/82    Pulse: (!) 127 84 85   Resp: (!) 35 (!) 22 13   Temp:    97.9 F (36.6 C)  TempSrc:    Axillary  SpO2: 99% 97% 100%    Neurology awake and alert ENT with no pallor Cardiovascular with S1 and S2 present and rhythmic with no gallops or murmurs Respiratory with rales bilaterally with no wheezing Abdomen with no distention No lower extremity edema  Data Reviewed:    Family Communication: no family at the bedside   Disposition: Status is: Inpatient Remains inpatient appropriate because: heart failure   Planned Discharge Destination: Home      Author: Tawni Millers, MD 11/19/2021 1:25 PM  For on call review [email or web address].

## 2021-11-19 NOTE — Assessment & Plan Note (Signed)
Will start patient on losartan and spironolactone.

## 2021-11-19 NOTE — Assessment & Plan Note (Signed)
Continue with ezetimibe.

## 2021-11-19 NOTE — ED Notes (Signed)
The EMT came to tell me that this pt had gone to the bathroom and while trying move her bowels she became in respiratory distress.  Her breathing was audibly wet. I called RT and grabbed Dr. Gilford Raid who concurred that the pt is in flash pulmonary edema and needs Bipap. Pt is on Bipap at this time and improving. Pt moved her bowels more after being placed on bipap.

## 2021-11-19 NOTE — Hospital Course (Signed)
Leslie Walker was admitted to the hospital with the working diagnosis of heart failure decompensation.   56 yo female with the past medical history of heart failure,  hypertension, and dyslipidemia who presented with dyspnea. Reported one week of worsening symptoms, associated with orthopnea and lower extremity edema. Patient has been off antihypertensive medications at home, including diuretics. Has been using cocaine intermittently. On her initial physical examination her blood pressure was 127/98, HR 79, RR 29 and 02 saturation 94%, lungs with no wheezing or rhonchi, abdomen with no distention, positive lower extremity edema.   Na 137, K 4,1 Cl 108, bicarbonate 21, glucose 133, bun 14 cr 1,0 BNP 2,512  High sensitive troponin 101, 80  Wbc 6,1 hgb 13,3 plt 354   Chest radiograph with cardiomegaly, bilateral hilar vascular congestion, bilateral interstitial infiltrates, with small bilateral pleural effusions. Cephalization of the vasculatures.   CT chest with bilateral ground glass opacities, small bilateral pleural effusions, more right than left.   EKG 106 bpm, normal axis, normal intervals, sinus rhythm with left atrial enlargement, with poor R R wave progression, with no ST segment changes, negative T wave V5 and V6.   Patient was placed on diuresis and supplemental 02 per [place]  11/03 worsening dyspnea and worsening pulmonary edema, required non invasive mechanical ventilation Bipap (while still in the ED).  11/04 patient with improvement in volume status, liberated from non invasive mechanical ventilation.  11/04 volume status continue to improved, adjusting heart failure medications, with possible discharge home in 24 hrs.

## 2021-11-19 NOTE — Progress Notes (Signed)
PT advised of belongings responsibility.  Has purse with wallet at bedside. States she will have children come in to get it. Denies she has medications with her. Has 9 post earrings.

## 2021-11-19 NOTE — Progress Notes (Signed)
   Heart Failure Stewardship Pharmacist Progress Note   PCP: Elbert Ewings, FNP PCP-Cardiologist: Pixie Casino, MD    HPI:  Leslie Walker is a 56 yo female with a PMH of chronic systolic heart failure, with most recent LVEF 45 to 50%, essential hypertension, hyperlipidemia, NSTEMI in 2022 treated with DES to RCA, TIA, and substance (cocaine/heroin) abuse who presented to the ED with shortness of breath that had progressively worsened in the week prior. Patient was hospitalized for acute heart failure 09/2020 with echocardiogram during the admission that showed LVEF of 45-50%. She was previously prescribed medications for heart failure, however was not taking any for about 2 months prior to admission.   This morning, patient reported feeling better after IV Lasix and was able to lay flat. Later today, she became short of breath during a bowel movement, found to have flash pulmonary edema requiring BIPAP. On exam, her urine was clear. New echo is still pending.  Current HF Medications: Diuretic: Lasix 40 mg IV BID  Prior to admission HF Medications: None Previously prescribed Entresto and metoprolol  Pertinent Lab Values: Serum creatinine 1.02, BUN 13, Potassium 3.7, Sodium 138, BNP 2512, Magnesium 2, A1c 5.6 (09/2020)   Vital Signs: Weight: no weights this admission Blood pressure: 140/80s  Heart rate: 70s  I/O: -1.2L yesterday; net -1.2L (unsure of accuracy)  Medication Assistance / Insurance Benefits Check: Does the patient have prescription insurance?  No Type of insurance plan: none  Does the patient qualify for medication assistance through manufacturers or grants?   Yes Eligible grants and/or patient assistance programs: yes Medication assistance applications in progress: Entresto and Jardiance  Medication assistance applications approved: pending Approved medication assistance renewals will be completed by: pending  700/mo  Outpatient Pharmacy:  Prior to admission  outpatient pharmacy: [facility] Is the patient willing to use [place] pharmacy at discharge? Yes Is the patient willing to transition their outpatient pharmacy to utilize a [facility] outpatient pharmacy?   Yes    Assessment: 1. Acute on chronic systolic CHF (LVEF 16% in 2022%), due to ICM. NYHA class II symptoms after diuresis. - No peripheral edema present on exam. Flash pulmonary edema. Agree with increasing Lasix to 60 mg IV BID - BP is elevated, would benefit from [place]. Based on labs, would also tolerate adding an SGLT2i inhibitor. Further GDMT can be added tomorrow pending labs.  - Recommend weight documentation daily and strict I/Os   Plan: 1) Medication changes recommended at this time: -Start Entresto 24/26 mg BID -Start Jardiance 10 mg daily  2) Patient assistance: - PAP applications in process for Entresto and Jardiance  3)  Education  - To be completed prior to discharge  Thank you for allowing pharmacy to participate in this patient's care.  Leslie Walker, PharmD PGY2 Pharmacy Resident 11/19/2021 9:06 AM Check [email or web address] for unit specific pharmacy number

## 2021-11-19 NOTE — Assessment & Plan Note (Addendum)
Echocardiogram with further reduction in LV systolic function with EF 20 to 25%, global hypokinesis, moderate dilatation of internal cavity, elevated left atrial pressure, RV systolic function preserved, LA with moderate dilatation, small pericardial effusion. Positive aortic valve regurgitation moderate to severe.   Troponin trended down, elevation due to heart failure decompensation.   Patient received furosemide for diuresis, negative fluid balance was achieved, 1,7561 ml with significant improvement of her symptoms.   Noted in telemetry 4 to 5 beat of non sustained VT, which have improved with the addition of B blocker.   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   TEE with moderate to sever aortic insufficiency.   Medical therapy with carvedilol, empagliflozin, entresto and spironolactone. Resume loop diuretic in 24 hrs.   Acute hypoxemic respiratory failure due to acute cardiogenic pulmonary edema. Patient with improved oxygenation and work of breathing Currently her 02 saturation is 98% on room air.

## 2021-11-19 NOTE — Progress Notes (Signed)
Heart Failure Navigator Progress Note  Following this hospitalization to assess for HV TOC readiness.   ECHO pending Last EF 45-50% (9/22)   Earnestine Leys, BSN, RN Heart Failure Leisure centre manager Chat Only

## 2021-11-19 NOTE — ED Provider Notes (Signed)
Pt is here awaiting admission.  The nurse called me to see if I could see this patient as her status has changed.  He said she was fine all morning, then developed severe sob after straining in the bathroom.  O2 sats dropped and RR and HR went up.  Pt diaphoretic and in resp distress.  Pt started on bipap.  She had not yet received her am lasix, so that was given.  Repeat CXR ordered.  Repeat EKG with HR slightly faster than prior.  Otherwise, no other changes.  Bipap has improved pt's sx.  Pt's attending, Dr. Cathlean Sauer, notified.   Isla Pence, MD 11/19/21 1116

## 2021-11-19 NOTE — ED Notes (Signed)
Patient watching TV, eating snacks, No s/s of any distress. No verbal c/o pain or discomfort. VSS. Report given to oncoming nurse.

## 2021-11-19 NOTE — ED Notes (Signed)
Purple man requested from [facility]

## 2021-11-19 NOTE — ED Notes (Signed)
2nd request to [facility] for purple man, spoke with Debroah Baller at this time.

## 2021-11-20 DIAGNOSIS — I2510 Atherosclerotic heart disease of native coronary artery without angina pectoris: Secondary | ICD-10-CM

## 2021-11-20 DIAGNOSIS — I2583 Coronary atherosclerosis due to lipid rich plaque: Secondary | ICD-10-CM

## 2021-11-20 LAB — BASIC METABOLIC PANEL
Anion gap: 7 (ref 5–15)
BUN: 12 mg/dL (ref 6–20)
CO2: 30 mmol/L (ref 22–32)
Calcium: 8.5 mg/dL — ABNORMAL LOW (ref 8.9–10.3)
Chloride: 101 mmol/L (ref 98–111)
Creatinine, Ser: 0.98 mg/dL (ref 0.44–1.00)
GFR, Estimated: >60 mL/min (ref >60)
Glucose, Bld: 109 mg/dL — ABNORMAL HIGH (ref 70–99)
Potassium: 3.6 mmol/L (ref 3.5–5.1)
Sodium: 138 mmol/L (ref 135–145)

## 2021-11-20 LAB — MAGNESIUM: Magnesium: 2 mg/dL (ref 1.7–2.4)

## 2021-11-20 MED ORDER — CLOPIDOGREL BISULFATE 75 MG PO TABS
75.0000 mg | ORAL_TABLET | Freq: Every day | ORAL | Status: DC
  Administered 2021-11-20 – 2021-11-24 (×5): 75 mg via ORAL
  Filled 2021-11-20 (×5): qty 1

## 2021-11-20 MED ORDER — POTASSIUM CHLORIDE CRYS ER 20 MEQ PO TBCR
40.0000 meq | EXTENDED_RELEASE_TABLET | Freq: Once | ORAL | Status: AC
  Administered 2021-11-20: 40 meq via ORAL
  Filled 2021-11-20: qty 2

## 2021-11-20 MED ORDER — SPIRONOLACTONE 12.5 MG HALF TABLET
12.5000 mg | ORAL_TABLET | Freq: Every day | ORAL | Status: DC
  Administered 2021-11-20 – 2021-11-22 (×3): 12.5 mg via ORAL
  Filled 2021-11-20 (×3): qty 1

## 2021-11-20 MED ORDER — ATORVASTATIN CALCIUM 40 MG PO TABS
40.0000 mg | ORAL_TABLET | Freq: Every day | ORAL | Status: DC
  Administered 2021-11-20 – 2021-11-24 (×5): 40 mg via ORAL
  Filled 2021-11-20 (×5): qty 1

## 2021-11-20 MED ORDER — LOSARTAN POTASSIUM 25 MG PO TABS
25.0000 mg | ORAL_TABLET | Freq: Every day | ORAL | Status: DC
  Administered 2021-11-20 – 2021-11-22 (×3): 25 mg via ORAL
  Filled 2021-11-20 (×3): qty 1

## 2021-11-20 NOTE — TOC Progression Note (Signed)
Transition of Care [facility]) - Progression Note    Patient Details  Name: Leslie Walker MRN: [id number] Date of Birth: [birth year]  Transition of Care [facility]) CM/SW Contact  Zenon Mayo, RN Phone Number: 11/20/2021, 3:20 PM  Clinical Narrative:    From home , acute/chronic HF, conts on iv lasix, PSA.  TOC following.         Expected Discharge Plan and Services                                                 Social Determinants of Health (SDOH) Interventions    Readmission Risk Interventions     No data to display

## 2021-11-20 NOTE — Progress Notes (Signed)
Progress Note   Patient: Leslie Walker UDJ:[id number] DOB: May 19, 1965 DOA: 11/18/2021     2 DOS: the patient was seen and examined on 11/20/2021   Brief hospital course: Mrs. Zwicker was admitted to the hospital with the working diagnosis of heart failure decompensation.   56 yo female with the past medical history of heart failure,  hypertension, and dyslipidemia who presented with dyspnea. Reported one week of worsening symptoms, associated with orthopnea and lower extremity edema. Patient has been off antihypertensive medications at home, including diuretics. Has been using cocaine intermittently. On her initial physical examination her blood pressure was 127/98, HR 79, RR 29 and 02 saturation 94%, lungs with no wheezing or rhonchi, abdomen with no distention, positive lower extremity edema.   Na 137, K 4,1 Cl 108, bicarbonate 21, glucose 133, bun 14 cr 1,0 BNP 2,512  High sensitive troponin 101, 80  Wbc 6,1 hgb 13,3 plt 354   Chest radiograph with cardiomegaly, bilateral hilar vascular congestion, bilateral interstitial infiltrates, with small bilateral pleural effusions. Cephalization of the vasculatures.   CT chest with bilateral ground glass opacities, small bilateral pleural effusions, more right than left.   EKG 106 bpm, normal axis, normal intervals, sinus rhythm with left atrial enlargement, with poor R R wave progression, with no ST segment changes, negative T wave V5 and V6.   Patient was placed on diuresis and supplemental 02 per [place]  11/03 worsening dyspnea and worsening pulmonary edema, required non invasive mechanical ventilation Bipap (while still in the ED).  11/04 patient with improvement in volume status, liberated from non invasive mechanical ventilation.      Assessment and Plan: * Acute on chronic systolic heart failure (HCC) Echocardiogram with further reduction in LV systolic function with EF 20 to 25%, global hypokinesis, moderate dilatation of internal cavity,  elevated left atrial pressure, RV systolic function preserved, LA with moderate dilatation, small pericardial effusion. Positive aortic valve regurgitation moderate to severe.   Troponin is trending down, elevation due to heart failure decompensation.   Documented urine output is 5,885 ml  Systolic blood pressure is 154 to 133 mmHg.   Her volume status has improved. Plan to start patient on heart failure therapy with losartan and spironolactone. Hold on B blockade for now.  Will need to check if patient can afford SGLT 2 inh  Hold on furosemide for now.  Add 40 meq to prevent hypokalemia.   Aortic regurgitation possible functional due to reduced LV systolic function and internal cavity dilatation, will optimize medical therapy and check with cardiology.   Acute hypoxemic respiratory failure due to acute cardiogenic pulmonary edema. Patient with improved oxygenation and work of breathing, her 02 saturation today is 100% on 2 L/min per [place].   Essential hypertension Will start patient on losartan and spironolactone.   Coronary artery disease PCI and DES 09/2020 to mid RCA,  Patient on aspirin, clopidogrel, atorvastatin and metoprolol at home, per outpatient records, personally reviewed.   Will resume clopidogrel and atorvastatin Hold on B blocker for now, due to heart failure exacerbation.   Hyperlipidemia Continue with ezetimibe.   Polysubstance abuse [facility]) Patient with history of cocaine use. Will need support as outpatient.         Subjective: Patient with improvement in her dyspnea, no chest pain, no edema   Physical Exam: Vitals:   11/19/21 1936 11/19/21 2353 11/20/21 0518 11/20/21 0759  BP: 108/85 128/85 (!) 154/96 133/81  Pulse: 80 78 85 81  Resp: '17 18 17 '$ 18  Temp: 99.2 F (37.3 C) 98.7 F (37.1 C) 97.8 F (36.6 C) 97.9 F (36.6 C)  TempSrc: Oral Oral Oral Oral  SpO2: 100% 98% 100% 100%  Weight:   64.7 kg   Height:       Neurology awake and alert ENT  with no pallor Cardiovascular with S1 and S2 present and rhythmic, positive diastolic murmur at the right upper sternal border. No gallops Respiratory with mild rales at bases with no wheezing, Abdomen with no distention  No lower extremity edema  Data Reviewed:    Family Communication: no family at the baseline   Disposition: Status is: Inpatient Remains inpatient appropriate because: heart failure   Planned Discharge Destination: Home    Author: Tawni Millers, MD 11/20/2021 9:19 AM  For on call review [email or web address].

## 2021-11-20 NOTE — Assessment & Plan Note (Addendum)
PCI and DES 09/2020 to mid RCA,  Patient on aspirin, clopidogrel, atorvastatin and metoprolol at home, per outpatient records, personally reviewed.   Continue with clopidogrel and atorvastatin Patient was placed on carvedilol.  Chronic Lcx mid stenosis.

## 2021-11-21 LAB — BASIC METABOLIC PANEL
Anion gap: 13 (ref 5–15)
BUN: 16 mg/dL (ref 6–20)
CO2: 26 mmol/L (ref 22–32)
Calcium: 8.5 mg/dL — ABNORMAL LOW (ref 8.9–10.3)
Chloride: 96 mmol/L — ABNORMAL LOW (ref 98–111)
Creatinine, Ser: 1.12 mg/dL — ABNORMAL HIGH (ref 0.44–1.00)
GFR, Estimated: 57 mL/min — ABNORMAL LOW (ref >60)
Glucose, Bld: 129 mg/dL — ABNORMAL HIGH (ref 70–99)
Potassium: 4.6 mmol/L (ref 3.5–5.1)
Sodium: 135 mmol/L (ref 135–145)

## 2021-11-21 MED ORDER — CARVEDILOL 3.125 MG PO TABS
3.1250 mg | ORAL_TABLET | Freq: Two times a day (BID) | ORAL | Status: DC
  Administered 2021-11-21 – 2021-11-24 (×7): 3.125 mg via ORAL
  Filled 2021-11-21 (×7): qty 1

## 2021-11-21 MED ORDER — EMPAGLIFLOZIN 10 MG PO TABS
10.0000 mg | ORAL_TABLET | Freq: Every day | ORAL | Status: DC
  Administered 2021-11-21 – 2021-11-24 (×4): 10 mg via ORAL
  Filled 2021-11-21 (×4): qty 1

## 2021-11-21 MED ORDER — ENOXAPARIN SODIUM 40 MG/0.4ML IJ SOSY
40.0000 mg | PREFILLED_SYRINGE | INTRAMUSCULAR | Status: DC
  Administered 2021-11-21: 40 mg via SUBCUTANEOUS
  Filled 2021-11-21: qty 0.4

## 2021-11-21 NOTE — Progress Notes (Addendum)
Progress Note   Patient: Leslie Walker IRC:[id number] DOB: [birth year] DOA: 11/18/2021     3 DOS: the patient was seen and examined on 11/21/2021   Brief hospital course: Leslie Walker was admitted to the hospital with the working diagnosis of heart failure decompensation.   56 yo female with the past medical history of heart failure,  hypertension, and dyslipidemia who presented with dyspnea. Reported one week of worsening symptoms, associated with orthopnea and lower extremity edema. Patient has been off antihypertensive medications at home, including diuretics. Has been using cocaine intermittently. On her initial physical examination her blood pressure was 127/98, HR 79, RR 29 and 02 saturation 94%, lungs with no wheezing or rhonchi, abdomen with no distention, positive lower extremity edema.   Na 137, K 4,1 Cl 108, bicarbonate 21, glucose 133, bun 14 cr 1,0 BNP 2,512  High sensitive troponin 101, 80  Wbc 6,1 hgb 13,3 plt 354   Chest radiograph with cardiomegaly, bilateral hilar vascular congestion, bilateral interstitial infiltrates, with small bilateral pleural effusions. Cephalization of the vasculatures.   CT chest with bilateral ground glass opacities, small bilateral pleural effusions, more right than left.   EKG 106 bpm, normal axis, normal intervals, sinus rhythm with left atrial enlargement, with poor R R wave progression, with no ST segment changes, negative T wave V5 and V6.   Patient was placed on diuresis and supplemental 02 per [place]  11/03 worsening dyspnea and worsening pulmonary edema, required non invasive mechanical ventilation Bipap (while still in the ED).  11/04 patient with improvement in volume status, liberated from non invasive mechanical ventilation.  11/04 volume status continue to improved, adjusting heart failure medications, with possible discharge home in 24 hrs.   Assessment and Plan: * Acute on chronic systolic heart failure (HCC) Echocardiogram with  further reduction in LV systolic function with EF 20 to 25%, global hypokinesis, moderate dilatation of internal cavity, elevated left atrial pressure, RV systolic function preserved, LA with moderate dilatation, small pericardial effusion. Positive aortic valve regurgitation moderate to severe.   Troponin is trending down, elevation due to heart failure decompensation.   Documented urine output is 5,102 ml  Systolic blood pressure is 120 to 130 mmHg.   Telemetry with 4 to 5 beat of non sustained VT.   Continue to improve volume status. Heart failure medical therapy with losartan and spironolactone, today will add carvedilol and SGLT 2 inh Holding loop diuretic for now to prevent hypotension.   Aortic valve regurgitation possible functional due to reduced LV systolic function and internal cavity dilatation, will continue to optimize medical therapy and check with cardiology.   Acute hypoxemic respiratory failure due to acute cardiogenic pulmonary edema. Patient with improved oxygenation and work of breathing, her 02 saturation today is 100% on 2 L/min per [place].  Plan to wean to room air today, out of bed to chair tid with meals and ambulate in the hallway.   Essential hypertension Blood pressure has been stable, continue with losartan, spironolactone and will add carvedilol and SGLT 2 inh. Continue close follow up on blood pressure,   Coronary artery disease PCI and DES 09/2020 to mid RCA,  Patient on aspirin, clopidogrel, atorvastatin and metoprolol at home, per outpatient records, personally reviewed.   Continue with clopidogrel and atorvastatin Start patient on carvedilol.   Hyperlipidemia Continue with ezetimibe.   Polysubstance abuse [facility]) Patient with history of cocaine use. Will need support as outpatient.         Subjective: Patient is feeling better,  dyspnea and edema have improved, no chest pain   Physical Exam: Vitals:   11/21/21 0010 11/21/21 0456 11/21/21 0720  11/21/21 0916  BP: (!) 125/91 138/87  130/78  Pulse: 66 85 79 81  Resp: '15 18  20  '$ Temp: 98.6 F (37 C) 98.2 F (36.8 C) 98.5 F (36.9 C) 98.4 F (36.9 C)  TempSrc: Oral Oral Oral Oral  SpO2: 100% 99% 100% 100%  Weight:  64.5 kg    Height:       Neurology awake and alert ENT with no pallor Cardiovascular with S1 and S2 present and rhythmic with no gallops, rubs or murmurs Respiratory with no rales or wheezing Abdomen with no distention  No lower extremity edema  Data Reviewed:    Family Communication: no family a the bedside   Disposition: Status is: Inpatient Remains inpatient appropriate because: heart failure   Planned Discharge Destination: Home    Author: Tawni Millers, MD 11/21/2021 9:24 AM  For on call review [email or web address].

## 2021-11-21 NOTE — Progress Notes (Signed)
Pt had 5 beats of VT, MD aware. Pt is asymptomatic. Walked the hallway with this RN with no issues.

## 2021-11-22 ENCOUNTER — Ambulatory Visit (INDEPENDENT_AMBULATORY_CARE_PROVIDER_SITE_OTHER): Payer: No Typology Code available for payment source

## 2021-11-22 ENCOUNTER — Encounter (HOSPITAL_COMMUNITY)
Admission: EM | Disposition: A | Discharge status: Home / Self Care | Payer: Self-pay | Source: Home / Self Care | Attending: Internal Medicine

## 2021-11-22 DIAGNOSIS — I2510 Atherosclerotic heart disease of native coronary artery without angina pectoris: Secondary | ICD-10-CM

## 2021-11-22 DIAGNOSIS — I429 Cardiomyopathy, unspecified: Secondary | ICD-10-CM

## 2021-11-22 DIAGNOSIS — I639 Cerebral infarction, unspecified: Secondary | ICD-10-CM

## 2021-11-22 HISTORY — PX: RIGHT/LEFT HEART CATH AND CORONARY ANGIOGRAPHY: CATH118266

## 2021-11-22 LAB — POCT I-STAT EG7
Acid-Base Excess: 2 mmol/L (ref 0.0–2.0)
Acid-Base Excess: 3 mmol/L — ABNORMAL HIGH (ref 0.0–2.0)
Bicarbonate: 27.5 mmol/L (ref 20.0–28.0)
Bicarbonate: 27.8 mmol/L (ref 20.0–28.0)
Calcium, Ion: 1.19 mmol/L (ref 1.15–1.40)
Calcium, Ion: 1.2 mmol/L (ref 1.15–1.40)
HCT: 37 % (ref 36.0–46.0)
HCT: 37 % (ref 36.0–46.0)
Hemoglobin: 12.6 g/dL (ref 12.0–15.0)
Hemoglobin: 12.6 g/dL (ref 12.0–15.0)
O2 Saturation: 64 %
O2 Saturation: 65 %
Potassium: 4.3 mmol/L (ref 3.5–5.1)
Potassium: 4.3 mmol/L (ref 3.5–5.1)
Sodium: 136 mmol/L (ref 135–145)
Sodium: 137 mmol/L (ref 135–145)
TCO2: 29 mmol/L (ref 22–32)
TCO2: 29 mmol/L (ref 22–32)
pCO2, Ven: 44.8 mmHg (ref 44–60)
pCO2, Ven: 45 mmHg (ref 44–60)
pH, Ven: 7.395 (ref 7.25–7.43)
pH, Ven: 7.401 (ref 7.25–7.43)
pO2, Ven: 34 mmHg (ref 32–45)
pO2, Ven: 34 mmHg (ref 32–45)

## 2021-11-22 LAB — BASIC METABOLIC PANEL
Anion gap: 6 (ref 5–15)
BUN: 9 mg/dL (ref 6–20)
CO2: 29 mmol/L (ref 22–32)
Calcium: 8.7 mg/dL — ABNORMAL LOW (ref 8.9–10.3)
Chloride: 103 mmol/L (ref 98–111)
Creatinine, Ser: 1.03 mg/dL — ABNORMAL HIGH (ref 0.44–1.00)
GFR, Estimated: >60 mL/min (ref >60)
Glucose, Bld: 108 mg/dL — ABNORMAL HIGH (ref 70–99)
Potassium: 4.5 mmol/L (ref 3.5–5.1)
Sodium: 138 mmol/L (ref 135–145)

## 2021-11-22 LAB — MAGNESIUM: Magnesium: 2.2 mg/dL (ref 1.7–2.4)

## 2021-11-22 SURGERY — RIGHT/LEFT HEART CATH AND CORONARY ANGIOGRAPHY
Anesthesia: LOCAL

## 2021-11-22 MED ORDER — SODIUM CHLORIDE 0.9 % IV SOLN: INTRAVENOUS | Status: DC

## 2021-11-22 MED ORDER — ENOXAPARIN SODIUM 40 MG/0.4ML IJ SOSY
40.0000 mg | PREFILLED_SYRINGE | INTRAMUSCULAR | Status: DC
  Administered 2021-11-23 – 2021-11-24 (×2): 40 mg via SUBCUTANEOUS
  Filled 2021-11-22 (×2): qty 0.4

## 2021-11-22 MED ORDER — MIDAZOLAM HCL 2 MG/2ML IJ SOLN
INTRAMUSCULAR | Status: DC | PRN
  Administered 2021-11-22: 1 mg via INTRAVENOUS

## 2021-11-22 MED ORDER — ACETAMINOPHEN 325 MG PO TABS: 650.0000 mg | ORAL_TABLET | ORAL | Status: DC | PRN

## 2021-11-22 MED ORDER — SODIUM CHLORIDE 0.9% FLUSH: 3.0000 mL | INTRAVENOUS | Status: DC | PRN

## 2021-11-22 MED ORDER — SODIUM CHLORIDE 0.9% FLUSH
3.0000 mL | Freq: Two times a day (BID) | INTRAVENOUS | Status: DC
  Administered 2021-11-22 – 2021-11-24 (×3): 3 mL via INTRAVENOUS

## 2021-11-22 MED ORDER — SACUBITRIL-VALSARTAN 24-26 MG PO TABS
1.0000 | ORAL_TABLET | Freq: Two times a day (BID) | ORAL | Status: DC
  Filled 2021-11-22: qty 1

## 2021-11-22 MED ORDER — IOHEXOL 350 MG/ML SOLN
INTRAVENOUS | Status: DC | PRN
  Administered 2021-11-22: 75 mL via INTRA_ARTERIAL

## 2021-11-22 MED ORDER — FENTANYL CITRATE (PF) 100 MCG/2ML IJ SOLN
INTRAMUSCULAR | Status: AC
  Filled 2021-11-22: qty 2

## 2021-11-22 MED ORDER — SODIUM CHLORIDE 0.9 % IV SOLN: 250.0000 mL | INTRAVENOUS | Status: DC | PRN

## 2021-11-22 MED ORDER — HYDRALAZINE HCL 20 MG/ML IJ SOLN: 10.0000 mg | INTRAMUSCULAR | Status: AC | PRN

## 2021-11-22 MED ORDER — LABETALOL HCL 5 MG/ML IV SOLN: 10.0000 mg | INTRAVENOUS | Status: AC | PRN

## 2021-11-22 MED ORDER — HEPARIN SODIUM (PORCINE) 1000 UNIT/ML IJ SOLN
INTRAMUSCULAR | Status: DC | PRN
  Administered 2021-11-22: 3000 [IU] via INTRAVENOUS

## 2021-11-22 MED ORDER — MIDAZOLAM HCL 2 MG/2ML IJ SOLN
INTRAMUSCULAR | Status: AC
  Filled 2021-11-22: qty 2

## 2021-11-22 MED ORDER — ONDANSETRON HCL 4 MG/2ML IJ SOLN: 4.0000 mg | Freq: Four times a day (QID) | INTRAMUSCULAR | Status: DC | PRN

## 2021-11-22 MED ORDER — HEPARIN SODIUM (PORCINE) 1000 UNIT/ML IJ SOLN
INTRAMUSCULAR | Status: AC
  Filled 2021-11-22: qty 10

## 2021-11-22 MED ORDER — SPIRONOLACTONE 25 MG PO TABS
25.0000 mg | ORAL_TABLET | Freq: Every day | ORAL | Status: DC
  Administered 2021-11-23 – 2021-11-24 (×2): 25 mg via ORAL
  Filled 2021-11-22 (×3): qty 1

## 2021-11-22 MED ORDER — HEPARIN (PORCINE) IN NACL 1000-0.9 UT/500ML-% IV SOLN
INTRAVENOUS | Status: AC
  Filled 2021-11-22: qty 1000

## 2021-11-22 MED ORDER — VERAPAMIL HCL 2.5 MG/ML IV SOLN
INTRAVENOUS | Status: DC | PRN
  Administered 2021-11-22: 10 mL via INTRA_ARTERIAL

## 2021-11-22 MED ORDER — ASPIRIN 81 MG PO CHEW: 81.0000 mg | CHEWABLE_TABLET | ORAL | Status: DC

## 2021-11-22 MED ORDER — LIDOCAINE HCL (PF) 1 % IJ SOLN
INTRAMUSCULAR | Status: AC
  Filled 2021-11-22: qty 30

## 2021-11-22 MED ORDER — ASPIRIN 81 MG PO CHEW
81.0000 mg | CHEWABLE_TABLET | ORAL | Status: AC
  Administered 2021-11-22: 81 mg via ORAL
  Filled 2021-11-22: qty 1

## 2021-11-22 MED ORDER — FUROSEMIDE 10 MG/ML IJ SOLN
60.0000 mg | Freq: Two times a day (BID) | INTRAMUSCULAR | Status: AC
  Administered 2021-11-22 – 2021-11-23 (×2): 60 mg via INTRAVENOUS
  Filled 2021-11-22 (×2): qty 6

## 2021-11-22 MED ORDER — SODIUM CHLORIDE 0.9% FLUSH
3.0000 mL | Freq: Two times a day (BID) | INTRAVENOUS | Status: DC
  Administered 2021-11-22 – 2021-11-24 (×5): 3 mL via INTRAVENOUS

## 2021-11-22 MED ORDER — HEPARIN (PORCINE) IN NACL 1000-0.9 UT/500ML-% IV SOLN
INTRAVENOUS | Status: DC | PRN
  Administered 2021-11-22 (×2): 500 mL

## 2021-11-22 MED ORDER — FUROSEMIDE 20 MG PO TABS
20.0000 mg | ORAL_TABLET | Freq: Every day | ORAL | Status: DC
  Administered 2021-11-22: 20 mg via ORAL
  Filled 2021-11-22: qty 1

## 2021-11-22 MED ORDER — VERAPAMIL HCL 2.5 MG/ML IV SOLN
INTRAVENOUS | Status: AC
  Filled 2021-11-22: qty 2

## 2021-11-22 MED ORDER — LIDOCAINE HCL (PF) 1 % IJ SOLN
INTRAMUSCULAR | Status: DC | PRN
  Administered 2021-11-22 (×2): 2 mL

## 2021-11-22 MED ORDER — FENTANYL CITRATE (PF) 100 MCG/2ML IJ SOLN
INTRAMUSCULAR | Status: DC | PRN
  Administered 2021-11-22 (×2): 25 ug via INTRAVENOUS

## 2021-11-22 SURGICAL SUPPLY — 15 items
BAND CMPR LRG ZPHR (HEMOSTASIS) ×1
BAND ZEPHYR COMPRESS 30 LONG (HEMOSTASIS) IMPLANT
CATH INFINITI 5 FR JL3.5 (CATHETERS) IMPLANT
CATH INFINITI 5FR ANG PIGTAIL (CATHETERS) IMPLANT
CATH INFINITI JR4 5F (CATHETERS) IMPLANT
CATH SWAN GANZ 7F STRAIGHT (CATHETERS) IMPLANT
COVER DOME SNAP 22 D (MISCELLANEOUS) IMPLANT
GLIDESHEATH SLEND SS 6F .021 (SHEATH) IMPLANT
GLIDESHEATH SLENDER 7FR .021G (SHEATH) IMPLANT
GUIDEWIRE INQWIRE 1.5J.035X260 (WIRE) IMPLANT
INQWIRE 1.5J .035X260CM (WIRE) ×1
KIT HEART LEFT (KITS) ×2 IMPLANT
PACK CARDIAC CATHETERIZATION (CUSTOM PROCEDURE TRAY) ×2 IMPLANT
SHEATH PROBE COVER 6X72 (BAG) IMPLANT
TRANSDUCER W/STOPCOCK (MISCELLANEOUS) ×2 IMPLANT

## 2021-11-22 NOTE — Progress Notes (Signed)
   Heart Failure Stewardship Pharmacist Progress Note   PCP: Elbert Ewings, FNP PCP-Cardiologist: Pixie Casino, MD    HPI:  Ms. Kita is a 56 yo female with a PMH of chronic systolic heart failure, essential hypertension, hyperlipidemia, NSTEMI in 2022 treated with DES to RCA, TIA, and substance (cocaine/heroin) abuse who presented to the ED on 11/2 with shortness of breath that had progressively worsened in the week prior. Patient was last hospitalized for acute heart failure 09/2020 with echocardiogram during the admission that showed LVEF of 45-50%. She was previously prescribed medications for heart failure, however was not taking any for about 2 months prior to admission. ECHO on 11/3 showed LVEF reduced to 20-25%, global hypokinesis, G2DD, RV normal.   Consider R/LHC prior to discharge.   Current HF Medications: Diuretic: furosemide 20 mg PO daily Beta Blocker: carvedilol 3.125 mg BID ACE/ARB/ARNI: Entresto 24/26 mg BID MRA: spironolactone 12.5 mg daily SGLT2i: Jardiance 10 mg daily  Prior to admission HF Medications: None Previously prescribed Entresto and metoprolol  Pertinent Lab Values: Serum creatinine 1.03, BUN 9, Potassium 4.5, Sodium 138, BNP 2512, Magnesium 2.2, A1c 5.6 (09/2020)   Vital Signs: Weight: 143 lbs (weight ~ time of admission 146 lbs) Blood pressure: 120/70s  Heart rate: 70s  I/O: -1.9L yesterday; net -5L  Medication Assistance / Insurance Benefits Check: Does the patient have prescription insurance?  No  Does the patient qualify for medication assistance through manufacturers or grants?   Yes Eligible grants and/or patient assistance programs: yes Medication assistance applications in progress: Entresto and Jardiance  Medication assistance applications approved: pending Approved medication assistance renewals will be completed by: pending  700/mo  Outpatient Pharmacy:  Prior to admission outpatient pharmacy: [place] Is the patient willing to use [place] pharmacy at discharge? Yes Is the patient willing to transition their outpatient pharmacy to utilize a [facility] outpatient pharmacy?   Yes    Assessment: 1. Acute on chronic systolic CHF (LVEF 16-10%), due to ICM. NYHA class II symptoms. Consider repeating cath this admission since LVEF has dropped and she is uninsured, will be difficult to arrange as outpatient.  - Agree with transitioning to furosemide 20 mg PO daily. Strict I/Os and daily weights. Keep K>4 and Mag>2. - Continue carvedilol 3.125 mg BID. Consider increasing dose tomorrow. - Agree with transitioning from losartan 25 mg daily to Entresto 24/26 mg BID (starting 11/7) - Continue spironolactone 12.5 mg daily, monitor K - Continue Jardiance 10 mg daily   Plan: 1) Medication changes recommended at this time: -Increase carvedilol tomorrow to 6.25 mg BID  2) Patient assistance: - PAP applications in process for Entresto and Jardiance  3)  Education - Initial education completed  - Full education to be completed prior to discharge  Kerby Nora, PharmD, BCPS Heart Failure Cytogeneticist Phone [phone]

## 2021-11-22 NOTE — Progress Notes (Signed)
Heart Failure Navigator Progress Note  Assessed for Heart & Vascular TOC clinic readiness.  Patient does not meet criteria due to Advanced Heart failure Team consulted.     Leslie Walker, BSN, Clinical cytogeneticist Only

## 2021-11-22 NOTE — H&P (View-Only) (Signed)
Progress Note   Patient: Leslie Walker [id number]:[id number] DOB: 18-Dec-1965 DOA: 11/18/2021     4 DOS: the patient was seen and examined on 11/22/2021   Brief hospital course: Mrs. Economou was admitted to the hospital with the working diagnosis of heart failure decompensation.   56 yo female with the past medical history of heart failure,  hypertension, and dyslipidemia who presented with dyspnea. Reported one week of worsening symptoms, associated with orthopnea and lower extremity edema. Patient has been off antihypertensive medications at home, including diuretics for several months. Has been using cocaine intermittently, including one week prior to presenting to the hospital.  On her initial physical examination her blood pressure was 127/98, HR 79, RR 29 and 02 saturation 94%, lungs with diffuse bilateral rales, abdomen with no distention, positive lower extremity edema.   Na 137, K 4,1 Cl 108, bicarbonate 21, glucose 133, bun 14 cr 1,0 BNP 2,512  High sensitive troponin 101, 80  Wbc 6,1 hgb 13,3 plt 354   Chest radiograph with cardiomegaly, bilateral hilar vascular congestion, bilateral interstitial infiltrates, with small bilateral pleural effusions. Cephalization of the vasculatures.   CT chest with bilateral ground glass opacities, small bilateral pleural effusions, more right than left.   EKG 106 bpm, normal axis, normal intervals, sinus rhythm with left atrial enlargement, with poor R R wave progression, with no ST segment changes, negative T wave V5 and V6.   Patient was placed on diuresis and supplemental 02 per [place]  11/03 worsening dyspnea and worsening pulmonary edema, required non invasive mechanical ventilation Bipap (while still in the ED).  11/04 patient with improvement in volume status, liberated from non invasive mechanical ventilation.  11/04 volume status continue to improved, adjusting heart failure medications. 11/05 patient with worsening LV systolic function, she has  been non compliant with her medications at home and has been using cocaine She has been advices to be compliant with her medications and avoid cocaine, her aortic valve has moderate to severe regurgitation.  11/06 plan for TEE and cardiac catheterization    Assessment and Plan: * Acute on chronic systolic heart failure (HCC) Echocardiogram with further reduction in LV systolic function with EF 20 to 25%, global hypokinesis, moderate dilatation of internal cavity, elevated left atrial pressure, RV systolic function preserved, LA with moderate dilatation, small pericardial effusion. Positive aortic valve regurgitation moderate to severe.   Troponin trended down, elevation due to heart failure decompensation.   Patient received furosemide for diuresis, negative fluid balance was achieved, - 5010 ml with significant improvement of her symptoms.   Noted in telemetry 4 to 5 beat of non sustained VT, which have improved with the addition of B blocker.   Patient will be discharge on heart failure regimen with entresto, spironolactone, empagliflozin and carvedilol. Diuresis with furosemide 20 mg and instructions to increase to 40 mg daily in case of volume overload. Will need close follow up as outpatient.   Aortic valve regurgitation possible functional due to reduced LV systolic function and internal cavity dilatation, will continue to optimize medical therapy Cardiology has recommended cardiac catheterization and TEE  Acute hypoxemic respiratory failure due to acute cardiogenic pulmonary edema. Patient with improved oxygenation and work of breathing At the time of her discharge her 02 saturation is 98% on room air.   Essential hypertension Blood pressure has been stable, continue with spironolactone and carvedilol. At the time of her discharge will resume entresto.  Follow up blood pressure as outpatient.   Coronary artery disease PCI and  DES 09/2020 to mid RCA,  Patient on aspirin,  clopidogrel, atorvastatin and metoprolol at home, per outpatient records, personally reviewed.   Continue with clopidogrel and atorvastatin Patient was placed on carvedilol.   Hyperlipidemia Continue with ezetimibe.   Polysubstance abuse [facility]) Patient with history of cocaine use. Will need support as outpatient.         Subjective: Patient is feeling better, no chest pain or dyspnea   Physical Exam: Vitals:   11/22/21 0030 11/22/21 0503 11/22/21 0506 11/22/21 0750  BP: 105/65 (!) 121/91  128/71  Pulse:  80  80  Resp: '17 18  20  '$ Temp: 98.3 F (36.8 C) 98.7 F (37.1 C)  98.6 F (37 C)  TempSrc: Oral Oral  Oral  SpO2: 96% 93%  100%  Weight:   65 kg   Height:       Neurology awake and alert ENT with no pallor Cardiovascular with S1 and S2 present and rhythmic with no gallops, rubs or murmurs Respiratory with no rales or wheezing Abdomen with no distention  No lower extremity edema   Data Reviewed:  \  Family Communication: no family at the bedside   Disposition: Status is: Inpatient Remains inpatient appropriate because: heart failure   Planned Discharge Destination: Home    Author: Tawni Millers, MD 11/22/2021 12:53 PM  For on call review [email or web address].

## 2021-11-22 NOTE — Progress Notes (Signed)
21:05 TR Band removed. Tegaderm and guaze dressing placed.

## 2021-11-22 NOTE — Progress Notes (Signed)
Heart and Vascular Care Navigation  11/22/2021  Timmya Blazier 12/24/1965 [id number]  Reason for Referral: Outpatient HF CSW consulted to met with pt regarding [facility] referral.   Engaged with patient face to face for initial visit for Heart and Vascular Care Coordination.                                                                                                   Assessment:   Outpatient HF CSW met with pt at bedside to discuss above.  Pt is agreeable to enrollment in Paramedicine program.  Paramedicine Initial Assessment:  Housing:  In what kind of housing do you live? House/apt/trailer/shelter? house  Do you rent/pay a mortgage/own? Reports it her daughters house  Do you live with anyone? daughter  Are you currently worried about losing your housing? no  Social:  What is your current marital status? single  Do you have any children? Daughter and son who both live locally  Income:  What is your current source of income? Was working part time up until last Monday- unsure about going back will be dependent on her health.  Insurance:  Are you currently insured?  No- reports she received something in the mail about medicaid that her dtr is helping with- will plan to bring paperwork into her clinic appt for me to look at.  Financial counseling also consulted to review for medicaid.  CSW coworker at [employer] has been taking with pt about CAFA and [employer].  Do you have prescription coverage? no  Transportation:  Do you have transportation to your medical appointments? Worried about transport as her car has broken down.  States she rides the bus and her family might can bring her sometimes but will plan to reach out if this will be a barrier so we can arrange ride.   Daily Health Needs: Do you have a working scale at home? No- hospital TOC CM stated they would bring to her before DC  How do you manage your medications at home? Takes them out of  the bottle.  Admits to taking at random times so not very consistent.  Do you have issues affording your medications? Yes- open to whatever help is available through PAP and [place] impressed importance of attending appt to be eligible to utilize [employer].  Do you have a PCP? No inpatient staff setting up appt prior to DC  Do you have any trouble reading or writing? no  Do you currently use tobacco products or have recently quit? no  Are there any additional barriers you see to getting the care you need? no                                     HRT/VAS Care Coordination     Patients Home Cardiology Office [facility]   Outpatient Care Team Social Worker; [facility] Paramedicine   Social Worker Name: Westley Hummer, [place], [facility] [phone], and Tammy Sours, [facility], [phone]   Living  arrangements for the past 2 months [place] with: Adult Children   Patient Current Insurance Coverage Orange Card   Patient Has Concern With Paying Medical Bills Yes   Patient Concerns With Medical Bills ongoing medical care   Medical Bill Referrals: pt has orange card, working on CAFA   Does Patient Have Prescription Coverage? No   Patient Prescription Assistance Programs [place] [facility]; Patient Assistance Programs   [place] [facility] Medications Pt working on completion   Home Assistive Devices/Equipment None   DME Agency NA   [place] History:                                                                             [place]: No Food Insecurity (11/19/2021)  Housing: Low Risk  (11/19/2021)  Transportation Needs: No Transportation Needs (11/19/2021)  Utilities: Not At Risk (11/19/2021)  Financial Resource Strain: High Risk (11/13/2020)  Physical Activity: Inactive (02/21/2017)  Social Connections: Somewhat Isolated (02/21/2017)  Stress: No Stress Concern Present (02/21/2017)  Tobacco Use: High Risk (11/19/2021)     SDOH Interventions: Financial Resources:    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  Will plan to follow up with pt in clinic regarding other concerns.  Jorge Ny, LCSW Clinical Social Worker [facility] Desk#: [phone] Cell#: [phone]

## 2021-11-22 NOTE — Plan of Care (Signed)

## 2021-11-22 NOTE — Consult Note (Addendum)
Advanced Heart Failure Team Consult Note   Primary Physician: Elbert Ewings, FNP PCP-Cardiologist:  Pixie Casino, MD  Reason for Consultation: A/C HFrEF  HPI:    Leslie Walker is seen today for evaluation of heart failure at the request of Dr Cathlean Sauer.   Leslie Walker is a 56 year old with a history of HTN, hyperlipidemia, stroke cypotogenic 2020, LINQ 2020, CAD, polysubstance abuse heroin/cocaine abuse, and chronic HFrEF.   Admitted 09/2020 with NSTEMI. CT negative for PE but did show multivessel coronary calcium. Echo showed EF 45-50%, RV norma, AV moderate aortic valve stenosis. Had Cath with overlapping DES to mid RCA, 70-80% left circumflex, left main patent, mid LAD 55%. Plan for DAPT with Aspirin + Brillinta x12 months.   She was last seen by [facility] in May, 2023 .  At that time she was having a hard time paying for medications. She no showed for the July appointment.   3 months ago she ran out of all medications. Says she didn't call for refills.   About 7 days ago she developed shortness of breath exertion and started sweating. Last used cocaine 7 days ago. Last used heroine 2 weeks ago. Denies IV drug use?Says she has been using cocaine for years. Rarely drinks alcohol. Uninsured. She has no working car. Lives with her daughter. Currently not working.   Admitted  112/23 with increased shortness of breath. Initially on Bipap but gradually weaned off. CXR with pulmonary edema. HS Trop 101>80. BNP 2512. CT negative PE. Diuresed with IV lasix . Echo this admit showed EF has gone down to 20-25%, mod-severe aortic regurgitation with possible mobile mass , and RV normal. Started on GDMT.    Review of Systems: [y] = yes, '[ ]'$  = no   General: Weight gain '[ ]'$ ; Weight loss '[ ]'$ ; Anorexia '[ ]'$ ; Fatigue '[ ]'$ ; Fever '[ ]'$ ; Chills '[ ]'$ ; Weakness [ Y]  Cardiac: Chest pain/pressure '[ ]'$ ; Resting SOB '[ ]'$ ; Exertional SOB '[ ]'$ ; Orthopnea [ Y]; Pedal Edema '[ ]'$ ; Palpitations '[ ]'$ ; Syncope '[ ]'$ ;  Presyncope '[ ]'$ ; Paroxysmal nocturnal dyspnea'[ ]'$   Pulmonary: Cough '[ ]'$ ; Wheezing'[ ]'$ ; Hemoptysis'[ ]'$ ; Sputum '[ ]'$ ; Snoring '[ ]'$   GI: Vomiting'[ ]'$ ; Dysphagia'[ ]'$ ; Melena'[ ]'$ ; Hematochezia '[ ]'$ ; Heartburn'[ ]'$ ; Abdominal pain '[ ]'$ ; Constipation '[ ]'$ ; Diarrhea '[ ]'$ ; BRBPR '[ ]'$   GU: Hematuria'[ ]'$ ; Dysuria '[ ]'$ ; Nocturia'[ ]'$   Vascular: Pain in legs with walking '[ ]'$ ; Pain in feet with lying flat '[ ]'$ ; Non-healing sores '[ ]'$ ; Stroke '[ ]'$ ; TIA '[ ]'$ ; Slurred speech '[ ]'$ ;  Neuro: Headaches'[ ]'$ ; Vertigo'[ ]'$ ; Seizures'[ ]'$ ; Paresthesias'[ ]'$ ;Blurred vision '[ ]'$ ; Diplopia '[ ]'$ ; Vision changes '[ ]'$   Ortho/Skin: Arthritis '[ ]'$ ; Joint pain [ Y]; Muscle pain '[ ]'$ ; Joint swelling '[ ]'$ ; Back Pain [Y ]; Rash '[ ]'$   Psych: Depression'[ ]'$ ; Anxiety'[ ]'$   Heme: Bleeding problems '[ ]'$ ; Clotting disorders '[ ]'$ ; Anemia '[ ]'$   Endocrine: Diabetes '[ ]'$ ; Thyroid dysfunction'[ ]'$   Home Medications Prior to Admission medications   Medication Sig Start Date End Date Taking? Authorizing Provider  aspirin 81 MG EC tablet Take 1 tablet (81 mg total) by mouth daily. Patient not taking: Reported on 11/18/2021 10/11/20   Charlynne Cousins, MD  atorvastatin (LIPITOR) 40 MG tablet Take 1 tablet (40 mg total) by mouth daily at 6 PM. Patient not taking: Reported on 11/18/2021 06/15/21   Deberah Pelton, NP  clopidogrel (PLAVIX) 75 MG tablet  Take 1 tablet (75 mg total) by mouth daily. Patient not taking: Reported on 11/18/2021 06/15/21   Deberah Pelton, NP  ezetimibe (ZETIA) 10 MG tablet Take 1 tablet (10 mg total) by mouth daily. 03/16/21 06/14/21  Deberah Pelton, NP  furosemide (LASIX) 20 MG tablet Take 20 mg by mouth daily. Patient not taking: Reported on 11/18/2021 06/27/20   [provider]  gabapentin (NEURONTIN) 100 MG capsule Take 100 mg by mouth 3 (three) times daily as needed (nerve pain). Patient not taking: Reported on 11/18/2021 04/22/20   [provider]  metoprolol tartrate (LOPRESSOR) 50 MG tablet Take 1 tablet (50 mg total) by mouth 2 (two) times  daily. Patient not taking: Reported on 11/18/2021 10/11/20   Charlynne Cousins, MD  sacubitril-valsartan (ENTRESTO) 97-103 MG Take 1 tablet by mouth 2 (two) times daily. Patient not taking: Reported on 11/18/2021 05/11/21   Deberah Pelton, NP    Past Medical History: Past Medical History:  Diagnosis Date   Chronic systolic heart failure ([place])    Cocaine abuse ([place])    Dyslipidemia    Hypertension    Noncompliance w/medication treatment due to intermit use of medication    Stroke [facility]) 2021   no deficits    Past Surgical History: Past Surgical History:  Procedure Laterality Date   CORONARY STENT INTERVENTION N/A 10/09/2020   Procedure: CORONARY STENT INTERVENTION;  Surgeon: Belva Crome, MD;  Location: [place] [facility];  Service: Cardiovascular;  Laterality: N/A;   INTRAVASCULAR PRESSURE WIRE/FFR STUDY N/A 10/09/2020   Procedure: INTRAVASCULAR PRESSURE WIRE/FFR STUDY;  Surgeon: Belva Crome, MD;  Location: [place] [facility];  Service: Cardiovascular;  Laterality: N/A;   LEFT HEART CATH AND CORONARY ANGIOGRAPHY N/A 10/09/2020   Procedure: LEFT HEART CATH AND CORONARY ANGIOGRAPHY;  Surgeon: Belva Crome, MD;  Location: [place] [facility];  Service: Cardiovascular;  Laterality: N/A;   LOOP RECORDER INSERTION N/A 06/27/2018   Procedure: LOOP RECORDER INSERTION;  Surgeon: Constance Haw, MD;  Location: [place] [facility];  Service: Cardiovascular;  Laterality: N/A;    Family History: Family History  Problem Relation Age of Onset   Hypertension Mother    Hypertension Father    Hypertension Brother    Stroke Sister     Social History: Social History   Socioeconomic History   Marital status: Single    Spouse name: Not on file   Number of children: 3   Years of education: Not on file   Highest education level: 12th grade  Occupational History   Not on file  Tobacco Use   Smoking status: Some Days    Types: Cigars   Smokeless tobacco: Never  Vaping Use    Vaping Use: Never used  Substance and Sexual Activity   Alcohol use: Yes    Comment: social drinking   Drug use: Not Currently    Types: Marijuana, Cocaine    Comment: 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   Sexual activity: Not Currently    Birth control/protection: Post-menopausal  Other Topics Concern   Not on file  Social History Narrative   Not on file   Social Determinants of Health   Financial Resource Strain: High Risk (11/13/2020)   Overall Financial Resource Strain (CARDIA)    Difficulty of Paying Living Expenses: Hard  Food Insecurity: No Food Insecurity (11/19/2021)   Hunger Vital Sign    Worried About Running Out of Food in the Last Year: Never true  Ran Out of Food in the Last Year: Never true  Transportation Needs: No Transportation Needs (11/19/2021)   PRAPARE - Hydrologist (Medical): No    Lack of Transportation (Non-Medical): No  Physical Activity: Inactive (02/21/2017)   Exercise Vital Sign    Days of Exercise per Week: 0 days    Minutes of Exercise per Session: 0 min  Stress: No Stress Concern Present (02/21/2017)   [place]    Feeling of Stress : Not at all  Social Connections: Somewhat Isolated (02/21/2017)   Social Connection and Isolation Panel [NHANES]    Frequency of Communication with Friends and Family: More than three times a week    Frequency of Social Gatherings with Friends and Family: Once a week    Attends Religious Services: 1 to 4 times per year    Active Member of Clubs or Organizations: No    Attends Archivist Meetings: Never    Marital Status: Never married    Allergies:  No Known Allergies  Objective:    Vital Signs:   Temp:  [98.2 F (36.8 C)-98.9 F (37.2 C)] 98.6 F (37 C) (11/06 0750) Pulse Rate:  [78-94] 80 (11/06 0750) Resp:  [14-20] 20 (11/06 0750) BP: (105-140)/(65-96) 128/71 (11/06 0750) SpO2:  [93 %-100 %] 100 %  (11/06 0750) Weight:  [65 kg] 65 kg (11/06 0506) Last BM Date : 11/20/21  Weight change: Filed Weights   11/20/21 0518 11/21/21 0456 11/22/21 0506  Weight: 64.7 kg 64.5 kg 65 kg    Intake/Output:   Intake/Output Summary (Last 24 hours) at 11/22/2021 1047 Last data filed at 11/22/2021 0750 Gross per 24 hour  Intake 750 ml  Output 1750 ml  Net -1000 ml      Physical Exam    General:   No resp difficulty HEENT: normal Neck: supple. JVP 8-9 . Carotids 2+ bilat; no bruits. No lymphadenopathy or thyromegaly appreciated. Cor: PMI nondisplaced. Regular rate & rhythm. No rubs, gallops or murmurs. Lungs: clear Abdomen: soft, nontender, nondistended. No hepatosplenomegaly. No bruits or masses. Good bowel sounds. Extremities: no cyanosis, clubbing, rash, edema Neuro: alert & orientedx3, cranial nerves grossly intact. moves all 4 extremities w/o difficulty. Affect pleasant   Telemetry   SR 83 bpm   EKG    On admit 133 bpm   Labs   Basic Metabolic Panel: Recent Labs  Lab 11/18/21 1107 11/19/21 0339 11/20/21 0041 11/21/21 0034 11/22/21 0026  NA 137 138 138 135 138  K 4.1 3.7 3.6 4.6 4.5  CL 108 106 101 96* 103  CO2 21* '27 30 26 29  '$ GLUCOSE 133* 107* 109* 129* 108*  BUN '15 13 12 16 9  '$ CREATININE 1.00 1.02* 0.98 1.12* 1.03*  CALCIUM 8.7* 8.1* 8.5* 8.5* 8.7*  MG  --  2.0 2.0  --  2.2    Liver Function Tests: Recent Labs  Lab 11/19/21 0339  AST 26  ALT 18  ALKPHOS 53  BILITOT 0.4  PROT 5.4*  ALBUMIN 2.8*   No results for input(s): "LIPASE", "AMYLASE" in the last 168 hours. No results for input(s): "AMMONIA" in the last 168 hours.  CBC: Recent Labs  Lab 11/18/21 1107 11/19/21 0339  WBC 6.1 5.3  NEUTROABS  --  1.8  HGB 13.3 12.0  HCT 36.6 33.3*  MCV 81.7 83.0  PLT 354 303    Cardiac Enzymes: No results for input(s): "CKTOTAL", "CKMB", "CKMBINDEX", "TROPONINI" in  the last 168 hours.  BNP: BNP (last 3 results) Recent Labs    11/18/21 1226  BNP  2,512.0*    ProBNP (last 3 results) No results for input(s): "PROBNP" in the last 8760 hours.   CBG: No results for input(s): "GLUCAP" in the last 168 hours.  Coagulation Studies: No results for input(s): "LABPROT", "INR" in the last 72 hours.   Imaging   No results found.   Medications:     Current Medications:  atorvastatin  40 mg Oral Daily   carvedilol  3.125 mg Oral BID WC   clopidogrel  75 mg Oral Daily   empagliflozin  10 mg Oral Daily   enoxaparin (LOVENOX) injection  40 mg Subcutaneous Q24H   ezetimibe  10 mg Oral Daily   furosemide  20 mg Oral Daily   [START ON 11/23/2021] sacubitril-valsartan  1 tablet Oral BID   spironolactone  12.5 mg Oral Daily    Infusions:     Patient Profile   Leslie Simons is a 56 year old with a history of HTN, hyperlipidemia, stroke,cypotogenic, LINQ, CAD, polysubstance abuse heroin/cocaine abuse, and chronic HFrEF.   Admitted with A/C HFrEF and Acute Respiratory Failure    Assessment/Plan   1. A/C HFrEF Echo this admit now showing EF has gone down from 45%--> 20-25%  Etiology multifactorial with known CAD, drug abuse, and medication noncompliance. - Will need RHC/LHC to further assess with significant drop in EP. Set up today.  - BNP > 2500 admit. Diuresed with IV lasix with improvement.  - On exam she is warm and dry.  - Continue lasix 20 mg daily.  - Continue low dose carvedilol, - Continue jardiance 10 mg daily - Continue entresto 24-26 mg twice a day  - Continue spiro 12.5 mg daily  - Renal function stable  2. Acute Hypoxic Respiratory Failure requiring Bipap  -CTA negative for PE -Now on room air with stable oxygen saturation.   3. CAD -H/O DES to RCA. Off meds for the last 3 months.  - No chest pain - HS Trop 100>80 - Will need cath today  - on statin, aspirin, plavix.   4. AV Regurgitation  -Mod-Severe on Echo.  - Will need TEE to further assess - She denies IV drug use.  - Blood cultures x2  5.  Polysubstance Abuse -Cocaine/Heroin. Last used about 7 days ago.  - Discussed cessation  6. H.O CVA 2020 LINQ placed  7. SDOH- Uninsured.   Adjust diuretics after cath.    Set up RHC/LHC with possible coronary intervention. TEE tomorrow.  Refer to HF Paramedicine/HF SW. She is at high risk for readmits with multiple SDOH needs.   Length of Stay: 4  Amy Clegg, NP  11/22/2021, 10:47 AM  Advanced Heart Failure Team Pager [phone] (M-F; 7a - 5p)  Please contact [facility] for night-coverage after hours (4p -7a ) and weekends on [email or web address]  Patient seen with NP, agree with the above note.   History as noted above.  Patient has history of CAD with EF in the 45-50% range.  She was admitted this time with CHF symptoms, echo done with EF 20-25% and moderate-severe AI.  She uses cocaine and heroin though she denies IV use of heroin.    RHC/LHC done today:  Coronary Findings  Diagnostic Dominance: Right Left Anterior Descending  Prox LAD lesion is 50% stenosed.  Mid LAD lesion is 40% stenosed.    First Diagonal Branch  1st Diag lesion is 45% stenosed.  Left Circumflex  Mid Cx lesion is 75% stenosed.    Right Coronary Artery  Prox RCA lesion is 30% stenosed.  Non-stenotic Mid RCA lesion was previously treated.    First Right Posterolateral Branch  Vessel is small in size.    Intervention   No interventions have been documented.   Right Heart  Right Heart Pressures RHC Procedural Findings: Hemodynamics (mmHg) RA mean 6 RV 52/9 PA 51/29, mean 39 PCWP mean 32 LV 162/40 AO 160/97  Oxygen saturations: PA 65% AO 99%  Cardiac Output (Fick) 4.07  Cardiac Index (Fick) 2.4 PVR 1.7 WU  Cardiac Output (Thermo) 3.39 Cardiac Index (Thermo) 2   General: NAD Neck: No JVD, no thyromegaly or thyroid nodule.  Lungs: Clear to auscultation bilaterally with normal respiratory effort. CV: Nondisplaced PMI.  Heart regular S1/S2, no S3/S4, 2/6 SEM RUSB with 1/6  diastolic murmur.  No peripheral edema.  No carotid bruit.  Normal pedal pulses.  Abdomen: Soft, nontender, no hepatosplenomegaly, no distention.  Skin: Intact without lesions or rashes.  Neurologic: Alert and oriented x 3.  Psych: Normal affect. Extremities: No clubbing or cyanosis.  HEENT: Normal.   1. Acute on chronic systolic CHF: Prior echo with EF 45-50%.  Echo this admission with EF 20-25%, global hypokinesis, moderate LV dilation, normal RV, mod-severe AI.  She was diuresed and on exam, volume does not look bad.  However, RHC today showed significantly elevated PCWP and LVEDP but normal RA pressure.  CI 2.4 Fick/2.0 thermo. This may be due to significantly elevated BP at time of cath. Cause of cardiomyopathy uncertain, CAD does not appear to have progressed (75% mLCx stenosis was noted on prior cath).  Possibly due to substance abuse/cocaine.  Aortic insufficiency may play a role.  - Lasix 60 mg IV bid x 2 more doses then reassess.  - Coreg 3.125 mg bid.  - Jardiance 10 mg daily - Entresto 24/26 bid - Increase spironolactone to 25 daily.  - Cardiac MRI to look for infiltrative disease and assess AI.  2. CAD: DES to RCA in 9/22.  No chest pain prior to this admission. Troponin mildly elevated with no trend, likely demand ischemia with volume overload.  Cath this admission with patent RCA stent, 50% proximal LAD, 75% mid LCx.  The LCx stenosis appeared similar to the prior cath.  Given lack of chest pain, would manage medically.  - Continue Plavix 75 daily.  - Continue statin.  3. Aortic valve disorder: Moderate-severe AI on echo. Cannot fully rule out endocarditis by echo.  She denies injection drug use.   - Send blood cultures.  - Would like TEE to evaluate valve, will order.  4. Polysubstance abuse: Cocaine/heroin.  She does not inject the heroin.  - Needs to quit. 5. CVA: 2020, has LINQ monitor.   Loralie Champagne 11/22/2021 3:02 PM

## 2021-11-22 NOTE — Progress Notes (Signed)
   11/22/21 1145  Mobility  Activity Ambulated independently in hallway  Level of Assistance Independent after set-up  Assistive Device None  Distance Ambulated (ft) 400 ft  Activity Response Tolerated well  Mobility Referral Yes  $Mobility charge 1 Mobility   Mobility Specialist Progress Note  Received pt in bed having no complaints and agreeable to mobility. Pt was asymptomatic throughout ambulation and returned to room w/o fault. Left in bed w/ call bell in reach and all needs met.   [name] Mobility Specialist

## 2021-11-22 NOTE — Interval H&P Note (Signed)
History and Physical Interval Note:  11/22/2021 1:43 PM  Leslie Walker  has presented today for surgery, with the diagnosis of nstemi.  The various methods of treatment have been discussed with the patient and family. After consideration of risks, benefits and other options for treatment, the patient has consented to  Procedure(s): RIGHT/LEFT HEART CATH AND CORONARY ANGIOGRAPHY (N/A) as a surgical intervention.  The patient's history has been reviewed, patient examined, no change in status, stable for surgery.  I have reviewed the patient's chart and labs.  Questions were answered to the patient's satisfaction.     Avyon Herendeen [employer]

## 2021-11-22 NOTE — TOC Progression Note (Addendum)
Transition of Care [facility]) - Progression Note    Patient Details  Name: Leslie Walker MRN: [id number] Date of Birth: 28-Dec-1965  Transition of Care [facility]) CM/SW Contact  Zenon Mayo, RN Phone Number: 11/22/2021, 3:37 PM  Clinical Narrative:    Patient does not have a PCP, will get her connected with a [facility], she states she lives with her daughter and she will have transport to go home today. She may need ast with medications with Match letter, will ask MD to send meds to [facility]. She did have a orange card but it expired she said.  She had gotten it from the [facility]. NCM will ast with medications with the Match Letter.  Plan is for dc tomorrow per MD.        Expected Discharge Plan and Services                                                 Social Determinants of Health (SDOH) Interventions    Readmission Risk Interventions     No data to display

## 2021-11-22 NOTE — Plan of Care (Signed)
  Problem: Education: Goal: Knowledge of General Education information will improve Description Including pain rating scale, medication(s)/side effects and non-pharmacologic comfort measures Outcome: Progressing   

## 2021-11-22 NOTE — Progress Notes (Signed)
Progress Note   Patient: Leslie Walker IZT:[id number] DOB: Mar 07, 1965 DOA: 11/18/2021     4 DOS: the patient was seen and examined on 11/22/2021   Brief hospital course: Leslie Walker was admitted to the hospital with the working diagnosis of heart failure decompensation.   56 yo female with the past medical history of heart failure,  hypertension, and dyslipidemia who presented with dyspnea. Reported one week of worsening symptoms, associated with orthopnea and lower extremity edema. Patient has been off antihypertensive medications at home, including diuretics for several months. Has been using cocaine intermittently, including one week prior to presenting to the hospital.  On her initial physical examination her blood pressure was 127/98, HR 79, RR 29 and 02 saturation 94%, lungs with diffuse bilateral rales, abdomen with no distention, positive lower extremity edema.   Na 137, K 4,1 Cl 108, bicarbonate 21, glucose 133, bun 14 cr 1,0 BNP 2,512  High sensitive troponin 101, 80  Wbc 6,1 hgb 13,3 plt 354   Chest radiograph with cardiomegaly, bilateral hilar vascular congestion, bilateral interstitial infiltrates, with small bilateral pleural effusions. Cephalization of the vasculatures.   CT chest with bilateral ground glass opacities, small bilateral pleural effusions, more right than left.   EKG 106 bpm, normal axis, normal intervals, sinus rhythm with left atrial enlargement, with poor R R wave progression, with no ST segment changes, negative T wave V5 and V6.   Patient was placed on diuresis and supplemental 02 per [place]  11/03 worsening dyspnea and worsening pulmonary edema, required non invasive mechanical ventilation Bipap (while still in the ED).  11/04 patient with improvement in volume status, liberated from non invasive mechanical ventilation.  11/04 volume status continue to improved, adjusting heart failure medications. 11/05 patient with worsening LV systolic function, she has  been non compliant with her medications at home and has been using cocaine She has been advices to be compliant with her medications and avoid cocaine, her aortic valve has moderate to severe regurgitation.  11/06 plan for TEE and cardiac catheterization    Assessment and Plan: * Acute on chronic systolic heart failure (HCC) Echocardiogram with further reduction in LV systolic function with EF 20 to 25%, global hypokinesis, moderate dilatation of internal cavity, elevated left atrial pressure, RV systolic function preserved, LA with moderate dilatation, small pericardial effusion. Positive aortic valve regurgitation moderate to severe.   Troponin trended down, elevation due to heart failure decompensation.   Patient received furosemide for diuresis, negative fluid balance was achieved, - 5010 ml with significant improvement of her symptoms.   Noted in telemetry 4 to 5 beat of non sustained VT, which have improved with the addition of B blocker.   Patient will be discharge on heart failure regimen with entresto, spironolactone, empagliflozin and carvedilol. Diuresis with furosemide 20 mg and instructions to increase to 40 mg daily in case of volume overload. Will need close follow up as outpatient.   Aortic valve regurgitation possible functional due to reduced LV systolic function and internal cavity dilatation, will continue to optimize medical therapy Cardiology has recommended cardiac catheterization and TEE  Acute hypoxemic respiratory failure due to acute cardiogenic pulmonary edema. Patient with improved oxygenation and work of breathing At the time of her discharge her 02 saturation is 98% on room air.   Essential hypertension Blood pressure has been stable, continue with spironolactone and carvedilol. At the time of her discharge will resume entresto.  Follow up blood pressure as outpatient.   Coronary artery disease PCI and  DES 09/2020 to mid RCA,  Patient on aspirin,  clopidogrel, atorvastatin and metoprolol at home, per outpatient records, personally reviewed.   Continue with clopidogrel and atorvastatin Patient was placed on carvedilol.   Hyperlipidemia Continue with ezetimibe.   Polysubstance abuse [facility]) Patient with history of cocaine use. Will need support as outpatient.         Subjective: Patient is feeling better, no chest pain or dyspnea   Physical Exam: Vitals:   11/22/21 0030 11/22/21 0503 11/22/21 0506 11/22/21 0750  BP: 105/65 (!) 121/91  128/71  Pulse:  80  80  Resp: '17 18  20  '$ Temp: 98.3 F (36.8 C) 98.7 F (37.1 C)  98.6 F (37 C)  TempSrc: Oral Oral  Oral  SpO2: 96% 93%  100%  Weight:   65 kg   Height:       Neurology awake and alert ENT with no pallor Cardiovascular with S1 and S2 present and rhythmic with no gallops, rubs or murmurs Respiratory with no rales or wheezing Abdomen with no distention  No lower extremity edema   Data Reviewed:  \  Family Communication: no family at the bedside   Disposition: Status is: Inpatient Remains inpatient appropriate because: heart failure   Planned Discharge Destination: Home    Author: Tawni Millers, MD 11/22/2021 12:53 PM  For on call review [email or web address].

## 2021-11-23 ENCOUNTER — Inpatient Hospital Stay (HOSPITAL_COMMUNITY): Payer: No Typology Code available for payment source | Admitting: Certified Registered"

## 2021-11-23 ENCOUNTER — Encounter (HOSPITAL_COMMUNITY)
Admission: EM | Disposition: A | Discharge status: Home / Self Care | Payer: Self-pay | Source: Home / Self Care | Attending: Internal Medicine

## 2021-11-23 ENCOUNTER — Inpatient Hospital Stay (HOSPITAL_COMMUNITY): Payer: No Typology Code available for payment source

## 2021-11-23 ENCOUNTER — Encounter (HOSPITAL_COMMUNITY): Payer: Self-pay | Admitting: Cardiology

## 2021-11-23 DIAGNOSIS — I3139 Other pericardial effusion (noninflammatory): Secondary | ICD-10-CM

## 2021-11-23 DIAGNOSIS — I509 Heart failure, unspecified: Secondary | ICD-10-CM

## 2021-11-23 DIAGNOSIS — I2510 Atherosclerotic heart disease of native coronary artery without angina pectoris: Secondary | ICD-10-CM

## 2021-11-23 DIAGNOSIS — I110 Hypertensive heart disease with heart failure: Secondary | ICD-10-CM

## 2021-11-23 DIAGNOSIS — I252 Old myocardial infarction: Secondary | ICD-10-CM

## 2021-11-23 DIAGNOSIS — F17210 Nicotine dependence, cigarettes, uncomplicated: Secondary | ICD-10-CM

## 2021-11-23 DIAGNOSIS — I083 Combined rheumatic disorders of mitral, aortic and tricuspid valves: Secondary | ICD-10-CM

## 2021-11-23 DIAGNOSIS — I359 Nonrheumatic aortic valve disorder, unspecified: Secondary | ICD-10-CM

## 2021-11-23 HISTORY — PX: TEE WITHOUT CARDIOVERSION: SHX5443

## 2021-11-23 LAB — BASIC METABOLIC PANEL
Anion gap: 12 (ref 5–15)
BUN: 12 mg/dL (ref 6–20)
CO2: 23 mmol/L (ref 22–32)
Calcium: 8.6 mg/dL — ABNORMAL LOW (ref 8.9–10.3)
Chloride: 103 mmol/L (ref 98–111)
Creatinine, Ser: 1.09 mg/dL — ABNORMAL HIGH (ref 0.44–1.00)
GFR, Estimated: 59 mL/min — ABNORMAL LOW (ref >60)
Glucose, Bld: 99 mg/dL (ref 70–99)
Potassium: 4.2 mmol/L (ref 3.5–5.1)
Sodium: 138 mmol/L (ref 135–145)

## 2021-11-23 SURGERY — ECHOCARDIOGRAM, TRANSESOPHAGEAL
Anesthesia: Monitor Anesthesia Care

## 2021-11-23 MED ORDER — GADOBUTROL 1 MMOL/ML IV SOLN
7.0000 mL | Freq: Once | INTRAVENOUS | Status: AC | PRN
  Administered 2021-11-23: 7 mL via INTRAVENOUS

## 2021-11-23 MED ORDER — ONDANSETRON HCL 4 MG/2ML IJ SOLN
INTRAMUSCULAR | Status: DC | PRN
  Administered 2021-11-23: 4 mg via INTRAVENOUS

## 2021-11-23 MED ORDER — PROPOFOL 10 MG/ML IV BOLUS
INTRAVENOUS | Status: DC | PRN
  Administered 2021-11-23 (×2): 20 mg via INTRAVENOUS

## 2021-11-23 MED ORDER — SACUBITRIL-VALSARTAN 49-51 MG PO TABS
1.0000 | ORAL_TABLET | Freq: Two times a day (BID) | ORAL | Status: DC
  Administered 2021-11-23: 1 via ORAL
  Filled 2021-11-23: qty 1

## 2021-11-23 MED ORDER — PROPOFOL 500 MG/50ML IV EMUL
INTRAVENOUS | Status: DC | PRN
  Administered 2021-11-23: 125 ug/kg/min via INTRAVENOUS

## 2021-11-23 MED ORDER — LIDOCAINE 2% (20 MG/ML) 5 ML SYRINGE
INTRAMUSCULAR | Status: DC | PRN
  Administered 2021-11-23: 60 mg via INTRAVENOUS

## 2021-11-23 MED ORDER — ESMOLOL HCL 100 MG/10ML IV SOLN
INTRAVENOUS | Status: DC | PRN
  Administered 2021-11-23: 10 mg via INTRAVENOUS

## 2021-11-23 NOTE — Anesthesia Preprocedure Evaluation (Addendum)
Anesthesia Evaluation  Patient identified by MRN, date of birth, ID band Patient awake    Reviewed: Allergy & Precautions, H&P , NPO status , Patient's Chart, lab work & pertinent test results  Airway Mallampati: I  TM Distance: >3 FB Neck ROM: Full    Dental no notable dental hx. (+) Edentulous Upper, Edentulous Lower,    Pulmonary Current Smoker   Pulmonary exam normal breath sounds clear to auscultation       Cardiovascular hypertension, Pt. on medications and Pt. on home beta blockers + CAD, + Past MI and +CHF  Normal cardiovascular exam Rhythm:Regular Rate:Normal  ECHO 11/23 IMPRESSIONS     1. Severe LV dysfunction; moderate to severe AI; mildly elevated gradient  across aortic valve suggests mild AS but can partially be explained by AI.  Compared to previous study, LV function is worse.   2. Left ventricular ejection fraction, by estimation, is 20 to 25%. The  left ventricle has severely decreased function. The left ventricle  demonstrates global hypokinesis. The left ventricular internal cavity size  was moderately dilated. Left  ventricular diastolic parameters are consistent with Grade II diastolic  dysfunction (pseudonormalization). Elevated left atrial pressure.   3. Right ventricular systolic function is normal. The right ventricular  size is normal.   4. Left atrial size was moderately dilated.   5. A small pericardial effusion is present.   6. The mitral valve is normal in structure. Mild mitral valve  regurgitation. No evidence of mitral stenosis.   7. The aortic valve is calcified. Aortic valve regurgitation is moderate  to severe. No aortic stenosis is present.   8. The inferior vena cava is normal in size with greater than 50%  respiratory variability, suggesting right atrial pressure of 3 mmHg.     Neuro/Psych CVA, No Residual Symptoms  negative psych ROS   GI/Hepatic negative GI ROS,,,(+)      substance abuse    Endo/Other  negative endocrine ROS    Renal/GU negative Renal ROS  negative genitourinary   Musculoskeletal negative musculoskeletal ROS (+)  narcotic dependent  Abdominal   Peds negative pediatric ROS (+)  Hematology negative hematology ROS (+)   Anesthesia Other Findings   Reproductive/Obstetrics negative OB ROS                             Anesthesia Physical Anesthesia Plan  ASA: 4  Anesthesia Plan: MAC   Post-op Pain Management: Minimal or no pain anticipated   Induction: Intravenous  PONV Risk Score and Plan: 2 and Propofol infusion  Airway Management Planned: Mask, Natural Airway and Simple Face Mask  Additional Equipment: None  Intra-op Plan:   Post-operative Plan:   Informed Consent:      Dental advisory given  Plan Discussed with: Anesthesiologist  Anesthesia Plan Comments:        Anesthesia Quick Evaluation

## 2021-11-23 NOTE — CV Procedure (Signed)
Procedure: TEE  Indication: Aortic insufficiency  Sedation: Per anesthesiology  Findings: Please see echo section for full report.  Moderately dilated LV with normal wall thickness.  Global hypokinesis with EF 20%. No LV thrombus. Normal right ventricular size with mildly decreased systolic function.  Mild left atrial enlargement.  No LA appendage thrombus.  Normal right atrium.  No ASD/PFO by color doppler.  Mild mitral regurgitation.  Trivial tricuspid regurgitation.  Bicuspid aortic valve with fused right and non-coronary cusps.  No aortic stenosis, mean gradient 5 mmHg.  There was moderate-severe aortic insufficiency with vena contracta 0.58 cm, PHT 211 msec.  No definite flow reversal in the descending thoracic aorta.   Moderate-severe aortic insufficiency.   Leslie Walker 11/23/2021 11:56 AM

## 2021-11-23 NOTE — Progress Notes (Signed)
Progress Note   Patient: Leslie Walker WFU:[id number] DOB: 1966-01-17 DOA: 11/18/2021     5 DOS: the patient was seen and examined on 11/23/2021   Brief hospital course: Mrs. Marcos was admitted to the hospital with the working diagnosis of heart failure decompensation.   56 yo female with the past medical history of heart failure,  hypertension, and dyslipidemia who presented with dyspnea. Reported one week of worsening symptoms, associated with orthopnea and lower extremity edema. Patient has been off antihypertensive medications at home, including diuretics for several months. Has been using cocaine intermittently, including one week prior to presenting to the hospital.  On her initial physical examination her blood pressure was 127/98, HR 79, RR 29 and 02 saturation 94%, lungs with diffuse bilateral rales, abdomen with no distention, positive lower extremity edema.   Na 137, K 4,1 Cl 108, bicarbonate 21, glucose 133, bun 14 cr 1,0 BNP 2,512  High sensitive troponin 101, 80  Wbc 6,1 hgb 13,3 plt 354   Chest radiograph with cardiomegaly, bilateral hilar vascular congestion, bilateral interstitial infiltrates, with small bilateral pleural effusions. Cephalization of the vasculatures.   CT chest with bilateral ground glass opacities, small bilateral pleural effusions, more right than left.   EKG 106 bpm, normal axis, normal intervals, sinus rhythm with left atrial enlargement, with poor R R wave progression, with no ST segment changes, negative T wave V5 and V6.   Patient was placed on diuresis and supplemental 02 per [place]  11/03 worsening dyspnea and worsening pulmonary edema, required non invasive mechanical ventilation Bipap (while still in the ED).  11/04 patient with improvement in volume status, liberated from non invasive mechanical ventilation.  11/04 volume status continue to improved, adjusting heart failure medications. 11/05 patient with worsening LV systolic function, she has  been non compliant with her medications at home and has been using cocaine She has been advices to be compliant with her medications and avoid cocaine, her aortic valve has moderate to severe regurgitation.  11/06 Cardiology was consulted, cardiac catheterization with elevated filling pressures 11/07 TEE with moderate to severe aortic insufficiency   Assessment and Plan: * Acute on chronic systolic heart failure (HCC) Echocardiogram with further reduction in LV systolic function with EF 20 to 25%, global hypokinesis, moderate dilatation of internal cavity, elevated left atrial pressure, RV systolic function preserved, LA with moderate dilatation, small pericardial effusion. Positive aortic valve regurgitation moderate to severe.   Troponin trended down, elevation due to heart failure decompensation.   Patient received furosemide for diuresis, negative fluid balance was achieved, 1,7561 ml with significant improvement of her symptoms.   Noted in telemetry 4 to 5 beat of non sustained VT, which have improved with the addition of B blocker.   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   TEE with moderate to sever aortic insufficiency.   Medical therapy with carvedilol, empagliflozin, entresto and spironolactone. Resume loop diuretic in 24 hrs.   Acute hypoxemic respiratory failure due to acute cardiogenic pulmonary edema. Patient with improved oxygenation and work of breathing Currently her 02 saturation is 98% on room air.   Essential hypertension Blood pressure has been stable, continue with spironolactone and carvedilol. Entresto. Will need close follow up.   Coronary artery disease PCI and DES 09/2020 to mid RCA,  Patient on aspirin, clopidogrel, atorvastatin and metoprolol at  home, per outpatient records, personally reviewed.    Continue with clopidogrel and atorvastatin Patient was placed on carvedilol.  Chronic Lcx mid stenosis.   Hyperlipidemia Continue with ezetimibe.   Polysubstance abuse [facility]) Patient with history of cocaine use. Will need support as outpatient.         Subjective: Patient with no chest pain or dyspnea, she has been somnolent post TEE   Physical Exam: Vitals:   11/23/21 1212 11/23/21 1224 11/23/21 1229 11/23/21 1250  BP: (!) 189/97 (!) 196/100 (!) 181/98 (!) 162/90  Pulse: 89 92 82 77  Resp:  (!) 27 (!) 22 (!) 30  Temp:      TempSrc:      SpO2: 98% 96% 96% 98%  Weight:      Height:       Neurology somnolent but easy to arouse, non focal ENT with mild pallor Cardiovascular with S1 and S2 present and rhythmic with no gallops, rubs or murmurs Respiratory with no rales or rhonchi Abdomen with no distention No lower extremity edema  Data Reviewed:    Family Communication: no family at the bedside   Disposition: Status is: Inpatient Remains inpatient appropriate because: heart failure   Planned Discharge Destination: Home      Author: Tawni Millers, MD 11/23/2021 7:34 PM  For on call review [email or web address].

## 2021-11-23 NOTE — TOC Initial Note (Addendum)
Transition of Care [facility]) - Initial/Assessment Note    Patient Details  Name: Leslie Walker MRN: [id number] Date of Birth: 04/12/1965  Transition of [facility]) CM/SW Contact:    Erenest Rasher, RN Phone Number: [phone] 11/23/2021, 12:05 PM  Clinical Narrative:                  HF TOC CM spoke to pt at bedside. She received a scale from HF clinic. Pt reports she lives with her daughter. Works part-time at [employer]. Will schedule PCP appt at [facility]. Will use MATCH/HF fund for medications. Will request medications come from [facility] clinic. Provided pt with resources for substance abuse and outpatient treatment.   Referral sent to Financial Counselor for Medicaid/CAFA screening, and disability.   Expected Discharge Plan: Home/Self Care Barriers to Discharge: Continued Medical Work up   Patient Goals and CMS Choice Patient states their goals for this hospitalization and ongoing recovery are:: wants to get better      Expected Discharge Plan and Services Expected Discharge Plan: Home/Self Care   Discharge Planning Services: CM Consult   Living arrangements for the past 2 months: Single Family Home                                      Prior Living Arrangements/Services Living arrangements for the past 2 months: Single Family Home Lives with:: Adult Children Patient language and need for interpreter reviewed:: Yes Do you feel safe going back to the place where you live?: Yes      Need for Family Participation in Patient Care: No (Comment) Care giver support system in place?: Yes (comment)   Criminal Activity/Legal Involvement Pertinent to Current Situation/Hospitalization: No - Comment as needed  Activities of Daily Living Home Assistive Devices/Equipment: None ADL Screening (condition at time of admission) Patient's cognitive ability adequate to safely complete daily activities?: Yes Is the patient deaf or have difficulty hearing?: No Does the patient  have difficulty seeing, even when wearing glasses/contacts?: Yes (glasses) Does the patient have difficulty concentrating, remembering, or making decisions?: No Patient able to express need for assistance with ADLs?: Yes Does the patient have difficulty dressing or bathing?: Yes Independently performs ADLs?: No Communication: Independent Dressing (OT): Needs assistance Is this a change from baseline?: Change from baseline, expected to last <3days Grooming: Needs assistance Is this a change from baseline?: Change from baseline, expected to last <3 days Feeding: Independent Bathing: Needs assistance Is this a change from baseline?: Change from baseline, expected to last <3 days Toileting: Needs assistance Is this a change from baseline?: Change from baseline, expected to last <3 days In/Out Bed: Needs assistance Is this a change from baseline?: Change from baseline, expected to last <3 days Walks in Home: Needs assistance Is this a change from baseline?: Change from baseline, expected to last <3 days Does the patient have difficulty walking or climbing stairs?: No Weakness of Legs: Both Weakness of Arms/Hands: None  Permission Sought/Granted Permission sought to share information with : Case Manager, Family Supports, PCP Permission granted to share information with : Yes, Verbal Permission Granted  Share Information with NAME: Leslie Walker     Permission granted to share info w Relationship: daughter  Permission granted to share info w Contact Information: [phone]  Emotional Assessment   Attitude/Demeanor/Rapport: Engaged Affect (typically observed): Accepting Orientation: : Oriented to Self, Oriented to Place, Oriented to  Time,  Oriented to Situation   Psych Involvement: No (comment)  Admission diagnosis:  Acute on chronic systolic heart failure (HCC) [I50.23] Adenopathy [R59.9] DOE (dyspnea on exertion) [R06.09] Heart failure (HCC) [I50.9] Patient Active Problem List    Diagnosis Date Noted   Coronary artery disease 11/20/2021   Elevated troponin 11/19/2021   Heart failure ([place]) 11/19/2021   Acute on chronic systolic heart failure ([place]) 00/37/0488   Chronic systolic heart failure (HCC)    Chest pain, precordial 10/09/2020   Cocaine use 10/09/2020   NSTEMI (non-ST elevated myocardial infarction) ([place]) 10/09/2020   Polysubstance abuse ([place]) 10/09/2020   Hyperlipidemia 10/09/2020   Lung nodule 10/09/2020   Tobacco dependence 10/09/2020   Screening breast examination 09/04/2018   Heroin dependence ([place]) 06/29/2018   TIA (transient ischemic attack) 06/24/2018   Nail fungus 04/20/2015   Porokeratosis 04/20/2015   History of ulceration 04/20/2015   Foot ulcer ([place]) 02/17/2014   Cellulitis of foot, right 02/17/2014   Chronic pain syndrome 02/17/2014   Essential hypertension 02/17/2014   PCP:  Elbert Ewings, FNP Pharmacy:   [place] ([place]), [place] [address] [place] [address] Phone: [phone] Fax: [place] [address], [place] [address] Phone: [phone] Fax: [place] [address] [address] Phone: [phone] Fax: [phone]     Social Determinants of Health (SDOH) Interventions    Readmission Risk Interventions     No data to display

## 2021-11-23 NOTE — Anesthesia Procedure Notes (Signed)
Procedure Name: MAC Date/Time: 11/23/2021 11:35 AM  Performed by: Amadeo Garnet, CRNAPre-anesthesia Checklist: Patient identified, Emergency Drugs available, Suction available and Patient being monitored Patient Re-evaluated:Patient Re-evaluated prior to induction Oxygen Delivery Method: Nasal cannula Preoxygenation: Pre-oxygenation with 100% oxygen Induction Type: IV induction Placement Confirmation: positive ETCO2 Dental Injury: Teeth and Oropharynx as per pre-operative assessment

## 2021-11-23 NOTE — Progress Notes (Signed)
Patient BP is 81/55. She is alert. Held the scheduled Entresto P.O. Provider notified.

## 2021-11-23 NOTE — Progress Notes (Signed)
Heart Failure Nurse Navigator Progress Note    Hf Navigator brought a scale to patients bedside, and educated about when to weigh herself as well as when to call her doctor for weight gain. Patient voiced her understanding of education.   Earnestine Leys, BSN, Clinical cytogeneticist Only

## 2021-11-23 NOTE — Progress Notes (Signed)
   11/23/21 2023  Assess: MEWS Score  Temp 97.6 F (36.4 C)  BP (!) 89/63  MAP (mmHg) 70  Pulse Rate 83  ECG Heart Rate 84  Resp 13  SpO2 95 %  Assess: MEWS Score  MEWS Temp 0  MEWS Systolic 1  MEWS Pulse 0  MEWS RR 1  MEWS LOC 1  MEWS Score 3  MEWS Score Color Yellow  Assess: if the MEWS score is Yellow or Red  Were vital signs taken at a resting state? Yes  Focused Assessment No change from prior assessment  Does the patient meet 2 or more of the SIRS criteria? No  MEWS guidelines implemented *See Row Information* Yes  Treat  MEWS Interventions Escalated (See documentation below)  Take Vital Signs  Increase Vital Sign Frequency  Yellow: Q 2hr X 2 then Q 4hr X 2, if remains yellow, continue Q 4hrs  Escalate  MEWS: Escalate Yellow: discuss with charge nurse/RN and consider discussing with provider and RRT  Notify: Charge Nurse/RN  Name of Charge Nurse/RN Notified Matthew, RN  Date Charge Nurse/RN Notified 11/23/21  Time Charge Nurse/RN Notified 2040  Assess: SIRS CRITERIA  SIRS Temperature  0  SIRS Pulse 0  SIRS Respirations  0  SIRS WBC 1  SIRS Score Sum  1   Yellow mews. Mews guidelines implemented. Will continue to monitor.

## 2021-11-23 NOTE — Progress Notes (Signed)
Patient returned from TEE lethargic and complaining of some nausea. Patient told her visitor "Ronalee Belts" that she felt bad and needed some dope. Staff aware and monitoring patient.

## 2021-11-23 NOTE — Progress Notes (Signed)
   11/23/21 1500  Mobility  Activity Ambulated with assistance to bathroom  Level of Assistance Independent  Assistive Device None  Distance Ambulated (ft) 10 ft  Activity Response Tolerated well  Mobility Referral Yes  $Mobility charge 1 Mobility   Mobility Specialist Progress Note  Pt requesting to go to the BR. Left in br with all needs met and rn notified  [name] Mobility Specialist

## 2021-11-23 NOTE — Progress Notes (Signed)
  Echocardiogram Echocardiogram Transesophageal has been performed.  Leslie Walker M 11/23/2021, 12:18 PM

## 2021-11-23 NOTE — H&P (View-Only) (Signed)
Advanced Heart Failure Rounding Note  PCP-Cardiologist: Pixie Casino, MD   Subjective:   10/22/21  RHC/LHC - Post cath diuresed with IV lasix.  1. Elevated PCWP and LVEDP but normal RV pressure.  2. Moderate pulmonary venous hypertension.  3. CI low by thermo, preserved by Fick.  4. There is a 75% mid LCx stenosis, unchanged from the past.   Hungry otherwise no complaints. Denies SOB.   Objective:   Weight Range: 63.8 kg Body mass index is 24.13 kg/m.   Vital Signs:   Temp:  [97.5 F (36.4 C)-99.6 F (37.6 C)] 98.7 F (37.1 C) (11/07 0400) Pulse Rate:  [76-87] 76 (11/07 0545) Resp:  [16-20] 18 (11/07 0400) BP: (95-136)/(66-90) 117/83 (11/07 0545) SpO2:  [92 %-100 %] 98 % (11/07 0400) Weight:  [63.8 kg] 63.8 kg (11/07 0400) Last BM Date : 11/22/21  Weight change: Filed Weights   11/21/21 0456 11/22/21 0506 11/23/21 0400  Weight: 64.5 kg 65 kg 63.8 kg    Intake/Output:   Intake/Output Summary (Last 24 hours) at 11/23/2021 0710 Last data filed at 11/23/2021 0324 Gross per 24 hour  Intake 368.33 ml  Output 2600 ml  Net -2231.67 ml      Physical Exam    General:  Well appearing. No resp difficulty. Standing in the room.  HEENT: Normal Neck: Supple. JVP 5-6  . Carotids 2+ bilat; no bruits. No lymphadenopathy or thyromegaly appreciated. Cor: PMI nondisplaced. Regular rate & rhythm. No rubs, gallops or murmurs. Lungs: Clear Abdomen: Soft, nontender, nondistended. No hepatosplenomegaly. No bruits or masses. Good bowel sounds. Extremities: No cyanosis, clubbing, rash, edema Neuro: Alert & orientedx3, cranial nerves grossly intact. moves all 4 extremities w/o difficulty. Affect pleasant   Telemetry  SR    EKG    N/A   Labs    CBC Recent Labs    11/22/21 1400  HGB 12.6  12.6  HCT 37.0  66.4   Basic Metabolic Panel Recent Labs    11/22/21 0026 11/22/21 1400 11/23/21 0045  NA 138 137  136 138  K 4.5 4.3  4.3 4.2  CL 103  --  103  CO2  29  --  23  GLUCOSE 108*  --  99  BUN 9  --  12  CREATININE 1.03*  --  1.09*  CALCIUM 8.7*  --  8.6*  MG 2.2  --   --    Liver Function Tests No results for input(s): "AST", "ALT", "ALKPHOS", "BILITOT", "PROT", "ALBUMIN" in the last 72 hours. No results for input(s): "LIPASE", "AMYLASE" in the last 72 hours. Cardiac Enzymes No results for input(s): "CKTOTAL", "CKMB", "CKMBINDEX", "TROPONINI" in the last 72 hours.  BNP: BNP (last 3 results) Recent Labs    11/18/21 1226  BNP 2,512.0*    ProBNP (last 3 results) No results for input(s): "PROBNP" in the last 8760 hours.   D-Dimer No results for input(s): "DDIMER" in the last 72 hours. Hemoglobin A1C No results for input(s): "HGBA1C" in the last 72 hours. Fasting Lipid Panel No results for input(s): "CHOL", "HDL", "LDLCALC", "TRIG", "CHOLHDL", "LDLDIRECT" in the last 72 hours. Thyroid Function Tests No results for input(s): "TSH", "T4TOTAL", "T3FREE", "THYROIDAB" in the last 72 hours.  Invalid input(s): "FREET3"  Other results:   Imaging    CARDIAC CATHETERIZATION  Result Date: 11/22/2021   Mid Cx lesion is 75% stenosed.   Prox RCA lesion is 30% stenosed.   1st Diag lesion is 45% stenosed.   Prox LAD  lesion is 50% stenosed.   Mid LAD lesion is 40% stenosed.   Non-stenotic Mid RCA lesion was previously treated. 1. Elevated PCWP and LVEDP but normal RV pressure. 2. Moderate pulmonary venous hypertension. 3. CI low by thermo, preserved by Fick. 4. There is a 75% mid LCx stenosis, unchanged from the past. Medical management.     Medications:     Scheduled Medications:  atorvastatin  40 mg Oral Daily   carvedilol  3.125 mg Oral BID WC   clopidogrel  75 mg Oral Daily   empagliflozin  10 mg Oral Daily   enoxaparin (LOVENOX) injection  40 mg Subcutaneous Q24H   ezetimibe  10 mg Oral Daily   furosemide  60 mg Intravenous BID   sacubitril-valsartan  1 tablet Oral BID   sodium chloride flush  3 mL Intravenous Q12H   sodium  chloride flush  3 mL Intravenous Q12H   spironolactone  25 mg Oral Daily    Infusions:  sodium chloride      PRN Medications: sodium chloride, acetaminophen, ondansetron (ZOFRAN) IV, sodium chloride flush    Patient Profile   Leslie Walker is a 56 year old with a history of HTN, hyperlipidemia, stroke,cypotogenic, LINQ, CAD, polysubstance abuse heroin/cocaine abuse, and chronic HFrEF.    Admitted with A/C HFrEF and Acute Respiratory Failure     Assessment/Plan   1. A/C HFrEF Echo this admit now showing EF has gone down from 45%--> 20-25%  Etiology multifactorial with known CAD, drug abuse, and medication noncompliance. -  BNP > 2500 admit. Diuresed with IV lasix with improvement.  - Had cath with stable coronary disease, elevated wedge., CI low on thermo but perserved by FIck.  - One more dose of IV lasix.  - Continue low dose carvedilol - Continue jardiance 10 mg daily - Increase entresto 49-51 mg twice a day.   - Increase spiro to 25 mg daily.   - Renal function stable - CMRI results pending.    2. Acute Hypoxic Respiratory Failure requiring Bipap  -CTA negative for PE -Now on room air with stable oxygen saturation.    3. CAD -H/O DES to RCA. Off meds for the last 3 months.  - No chest pain - HS Trop 100>80 -Stable coronary disease, no change.   - on statin, aspirin, plavix.    4. AV Regurgitation  -Mod-Severe on Echo.  - She denies IV drug use.  - 11/6 Blood cultures x2-No growth  - TEE later today.    5. Polysubstance Abuse -Cocaine/Heroin. Last used about 7 days ago.  - Discussed cessation   6. H.O CVA 2020 LINQ placed   7. SDOH- Uninsured.  Referred to HF Paramedicine  Needs scale.   Length of Stay: [place], NP  11/23/2021, 7:10 AM  Advanced Heart Failure Team Pager [phone] (M-F; 7a - 5p)  Please contact [facility] for night-coverage after hours (5p -7a ) and weekends on [email or web address]  Patient seen with NP, agree with the above note.    I/Os net negative 2231 with weight down 3 lbs.  No complaints this morning.  Creatinine stable 1.09.   General: NAD Neck: No JVD, no thyromegaly or thyroid nodule.  Lungs: Clear to auscultation bilaterally with normal respiratory effort. CV: Nondisplaced PMI.  Heart regular S1/S2, no S3/S4, 2/6 SEM RUSB.  No peripheral edema.   Abdomen: Soft, nontender, no hepatosplenomegaly, no distention.  Skin: Intact without lesions or rashes.  Neurologic: Alert and oriented x 3.  Psych: Normal  affect. Extremities: No clubbing or cyanosis.  HEENT: Normal.   Volume status looks ok on exam though on RHC yesterday she had very high PCWP with normal RA pressure.   - Would give 1 more dose of IV Lasix 60 mg this morning, transition to torsemide 20 mg daily tomorrow.  - Increase Entresto to 49/51 bid.  - Increase spironolactone to 25 mg daily.  - Cardiomyopathy may be primarily due to cocaine abuse.  CAD stable on cath yesterday (75% mid LCx, 50% pLAD).  Cardiac MRI done this morning, will review.   Patient has aortic valve disease not fully characterized by TTE, has moderate-severe AI.  Cannot fully rule out endocarditis given drug abuse though she denies injection.  Blood cultures negative and afebrile.  - TEE today, discussed risks/benefits with patient and she agrees to procedure.   Loralie Champagne 11/23/2021 8:53 AM

## 2021-11-23 NOTE — Interval H&P Note (Signed)
History and Physical Interval Note:  11/23/2021 11:24 AM  Leslie Walker  has presented today for surgery, with the diagnosis of aortic valve regurgitation.  The various methods of treatment have been discussed with the patient and family. After consideration of risks, benefits and other options for treatment, the patient has consented to  Procedure(s): TRANSESOPHAGEAL ECHOCARDIOGRAM (TEE) (N/A) as a surgical intervention.  The patient's history has been reviewed, patient examined, no change in status, stable for surgery.  I have reviewed the patient's chart and labs.  Questions were answered to the patient's satisfaction.     Joniel Graumann [employer]

## 2021-11-23 NOTE — Progress Notes (Signed)
Ambulation deferred b/c pt is having to micturate often. Pt received HF book and was educated on daily weights, S/S of fluid retention, when to all 911, taking medication, low NA diet, fluid restriction, and importance of progressive exercise. Pt denies questions. Will attempt to ambulate with pt in afternoon.   [id number] Leslie Walker 10:00 AM 11/23/2021

## 2021-11-23 NOTE — Transfer of Care (Signed)
Immediate Anesthesia Transfer of Care Note  Patient: Leslie Walker  Procedure(s) Performed: TRANSESOPHAGEAL ECHOCARDIOGRAM (TEE)  Patient Location: PACU and Endoscopy Unit  Anesthesia Type:MAC  Level of Consciousness: awake and patient cooperative  Airway & Oxygen Therapy: Patient Spontanous Breathing and Patient connected to nasal cannula oxygen  Post-op Assessment: Report given to RN and Post -op Vital signs reviewed and stable  Post vital signs: Reviewed and stable  Last Vitals:  Vitals Value Taken Time  BP 195/110 11/23/21 1202  Temp 36.2 C 11/23/21 1202  Pulse 105 11/23/21 1210  Resp 20 11/23/21 1202  SpO2 92 % 11/23/21 1210  Vitals shown include unvalidated device data.  Last Pain:  Vitals:   11/23/21 1202  TempSrc: Temporal  PainSc: 0-No pain         Complications: No notable events documented.

## 2021-11-23 NOTE — Progress Notes (Addendum)
Advanced Heart Failure Rounding Note  PCP-Cardiologist: Pixie Casino, MD   Subjective:   10/22/21  RHC/LHC - Post cath diuresed with IV lasix.  1. Elevated PCWP and LVEDP but normal RV pressure.  2. Moderate pulmonary venous hypertension.  3. CI low by thermo, preserved by Fick.  4. There is a 75% mid LCx stenosis, unchanged from the past.   Hungry otherwise no complaints. Denies SOB.   Objective:   Weight Range: 63.8 kg Body mass index is 24.13 kg/m.   Vital Signs:   Temp:  [97.5 F (36.4 C)-99.6 F (37.6 C)] 98.7 F (37.1 C) (11/07 0400) Pulse Rate:  [76-87] 76 (11/07 0545) Resp:  [16-20] 18 (11/07 0400) BP: (95-136)/(66-90) 117/83 (11/07 0545) SpO2:  [92 %-100 %] 98 % (11/07 0400) Weight:  [63.8 kg] 63.8 kg (11/07 0400) Last BM Date : 11/22/21  Weight change: Filed Weights   11/21/21 0456 11/22/21 0506 11/23/21 0400  Weight: 64.5 kg 65 kg 63.8 kg    Intake/Output:   Intake/Output Summary (Last 24 hours) at 11/23/2021 0710 Last data filed at 11/23/2021 0324 Gross per 24 hour  Intake 368.33 ml  Output 2600 ml  Net -2231.67 ml      Physical Exam    General:  Well appearing. No resp difficulty. Standing in the room.  HEENT: Normal Neck: Supple. JVP 5-6  . Carotids 2+ bilat; no bruits. No lymphadenopathy or thyromegaly appreciated. Cor: PMI nondisplaced. Regular rate & rhythm. No rubs, gallops or murmurs. Lungs: Clear Abdomen: Soft, nontender, nondistended. No hepatosplenomegaly. No bruits or masses. Good bowel sounds. Extremities: No cyanosis, clubbing, rash, edema Neuro: Alert & orientedx3, cranial nerves grossly intact. moves all 4 extremities w/o difficulty. Affect pleasant   Telemetry  SR    EKG    N/A   Labs    CBC Recent Labs    11/22/21 1400  HGB 12.6  12.6  HCT 37.0  14.4   Basic Metabolic Panel Recent Labs    11/22/21 0026 11/22/21 1400 11/23/21 0045  NA 138 137  136 138  K 4.5 4.3  4.3 4.2  CL 103  --  103  CO2  29  --  23  GLUCOSE 108*  --  99  BUN 9  --  12  CREATININE 1.03*  --  1.09*  CALCIUM 8.7*  --  8.6*  MG 2.2  --   --    Liver Function Tests No results for input(s): "AST", "ALT", "ALKPHOS", "BILITOT", "PROT", "ALBUMIN" in the last 72 hours. No results for input(s): "LIPASE", "AMYLASE" in the last 72 hours. Cardiac Enzymes No results for input(s): "CKTOTAL", "CKMB", "CKMBINDEX", "TROPONINI" in the last 72 hours.  BNP: BNP (last 3 results) Recent Labs    11/18/21 1226  BNP 2,512.0*    ProBNP (last 3 results) No results for input(s): "PROBNP" in the last 8760 hours.   D-Dimer No results for input(s): "DDIMER" in the last 72 hours. Hemoglobin A1C No results for input(s): "HGBA1C" in the last 72 hours. Fasting Lipid Panel No results for input(s): "CHOL", "HDL", "LDLCALC", "TRIG", "CHOLHDL", "LDLDIRECT" in the last 72 hours. Thyroid Function Tests No results for input(s): "TSH", "T4TOTAL", "T3FREE", "THYROIDAB" in the last 72 hours.  Invalid input(s): "FREET3"  Other results:   Imaging    CARDIAC CATHETERIZATION  Result Date: 11/22/2021   Mid Cx lesion is 75% stenosed.   Prox RCA lesion is 30% stenosed.   1st Diag lesion is 45% stenosed.   Prox LAD  lesion is 50% stenosed.   Mid LAD lesion is 40% stenosed.   Non-stenotic Mid RCA lesion was previously treated. 1. Elevated PCWP and LVEDP but normal RV pressure. 2. Moderate pulmonary venous hypertension. 3. CI low by thermo, preserved by Fick. 4. There is a 75% mid LCx stenosis, unchanged from the past. Medical management.     Medications:     Scheduled Medications:  atorvastatin  40 mg Oral Daily   carvedilol  3.125 mg Oral BID WC   clopidogrel  75 mg Oral Daily   empagliflozin  10 mg Oral Daily   enoxaparin (LOVENOX) injection  40 mg Subcutaneous Q24H   ezetimibe  10 mg Oral Daily   furosemide  60 mg Intravenous BID   sacubitril-valsartan  1 tablet Oral BID   sodium chloride flush  3 mL Intravenous Q12H   sodium  chloride flush  3 mL Intravenous Q12H   spironolactone  25 mg Oral Daily    Infusions:  sodium chloride      PRN Medications: sodium chloride, acetaminophen, ondansetron (ZOFRAN) IV, sodium chloride flush    Patient Profile   Leslie Walker is a 56 year old with a history of HTN, hyperlipidemia, stroke,cypotogenic, LINQ, CAD, polysubstance abuse heroin/cocaine abuse, and chronic HFrEF.    Admitted with A/C HFrEF and Acute Respiratory Failure     Assessment/Plan   1. A/C HFrEF Echo this admit now showing EF has gone down from 45%--> 20-25%  Etiology multifactorial with known CAD, drug abuse, and medication noncompliance. -  BNP > 2500 admit. Diuresed with IV lasix with improvement.  - Had cath with stable coronary disease, elevated wedge., CI low on thermo but perserved by FIck.  - One more dose of IV lasix.  - Continue low dose carvedilol - Continue jardiance 10 mg daily - Increase entresto 49-51 mg twice a day.   - Increase spiro to 25 mg daily.   - Renal function stable - CMRI results pending.    2. Acute Hypoxic Respiratory Failure requiring Bipap  -CTA negative for PE -Now on room air with stable oxygen saturation.    3. CAD -H/O DES to RCA. Off meds for the last 3 months.  - No chest pain - HS Trop 100>80 -Stable coronary disease, no change.   - on statin, aspirin, plavix.    4. AV Regurgitation  -Mod-Severe on Echo.  - She denies IV drug use.  - 11/6 Blood cultures x2-No growth  - TEE later today.    5. Polysubstance Abuse -Cocaine/Heroin. Last used about 7 days ago.  - Discussed cessation   6. H.O CVA 2020 LINQ placed   7. SDOH- Uninsured.  Referred to HF Paramedicine  Needs scale.   Length of Stay: [place], NP  11/23/2021, 7:10 AM  Advanced Heart Failure Team Pager [phone] (M-F; 7a - 5p)  Please contact [facility] for night-coverage after hours (5p -7a ) and weekends on [email or web address]  Patient seen with NP, agree with the above note.    I/Os net negative 2231 with weight down 3 lbs.  No complaints this morning.  Creatinine stable 1.09.   General: NAD Neck: No JVD, no thyromegaly or thyroid nodule.  Lungs: Clear to auscultation bilaterally with normal respiratory effort. CV: Nondisplaced PMI.  Heart regular S1/S2, no S3/S4, 2/6 SEM RUSB.  No peripheral edema.   Abdomen: Soft, nontender, no hepatosplenomegaly, no distention.  Skin: Intact without lesions or rashes.  Neurologic: Alert and oriented x 3.  Psych: Normal  affect. Extremities: No clubbing or cyanosis.  HEENT: Normal.   Volume status looks ok on exam though on RHC yesterday she had very high PCWP with normal RA pressure.   - Would give 1 more dose of IV Lasix 60 mg this morning, transition to torsemide 20 mg daily tomorrow.  - Increase Entresto to 49/51 bid.  - Increase spironolactone to 25 mg daily.  - Cardiomyopathy may be primarily due to cocaine abuse.  CAD stable on cath yesterday (75% mid LCx, 50% pLAD).  Cardiac MRI done this morning, will review.   Patient has aortic valve disease not fully characterized by TTE, has moderate-severe AI.  Cannot fully rule out endocarditis given drug abuse though she denies injection.  Blood cultures negative and afebrile.  - TEE today, discussed risks/benefits with patient and she agrees to procedure.   Loralie Champagne 11/23/2021 8:53 AM

## 2021-11-23 NOTE — Anesthesia Postprocedure Evaluation (Signed)
Anesthesia Post Note  Patient: Leslie Walker  Procedure(s) Performed: TRANSESOPHAGEAL ECHOCARDIOGRAM (TEE)     Patient location during evaluation: PACU Anesthesia Type: MAC Level of consciousness: awake and alert Pain management: pain level controlled Vital Signs Assessment: post-procedure vital signs reviewed and stable Respiratory status: spontaneous breathing, nonlabored ventilation, respiratory function stable and patient connected to nasal cannula oxygen Cardiovascular status: stable and blood pressure returned to baseline Postop Assessment: no apparent nausea or vomiting Anesthetic complications: no   No notable events documented.  Last Vitals:  Vitals:   11/23/21 1229 11/23/21 1250  BP: (!) 181/98 (!) 162/90  Pulse: 82 77  Resp: (!) 22 (!) 30  Temp:    SpO2: 96% 98%    Last Pain:  Vitals:   11/23/21 1202  TempSrc: Temporal  PainSc: 0-No pain                 Donyale Berthold

## 2021-11-24 ENCOUNTER — Other Ambulatory Visit (HOSPITAL_COMMUNITY): Payer: Self-pay

## 2021-11-24 LAB — BASIC METABOLIC PANEL
Anion gap: 10 (ref 5–15)
BUN: 26 mg/dL — ABNORMAL HIGH (ref 6–20)
CO2: 24 mmol/L (ref 22–32)
Calcium: 8.7 mg/dL — ABNORMAL LOW (ref 8.9–10.3)
Chloride: 101 mmol/L (ref 98–111)
Creatinine, Ser: 1.83 mg/dL — ABNORMAL HIGH (ref 0.44–1.00)
GFR, Estimated: 32 mL/min — ABNORMAL LOW (ref >60)
Glucose, Bld: 113 mg/dL — ABNORMAL HIGH (ref 70–99)
Potassium: 4 mmol/L (ref 3.5–5.1)
Sodium: 135 mmol/L (ref 135–145)

## 2021-11-24 LAB — LIPID PANEL
Cholesterol: 178 mg/dL (ref 0–200)
HDL: 46 mg/dL (ref >40)
LDL Cholesterol: 116 mg/dL — ABNORMAL HIGH (ref 0–99)
Total CHOL/HDL Ratio: 3.9 RATIO
Triglycerides: 78 mg/dL (ref <150)
VLDL: 16 mg/dL (ref 0–40)

## 2021-11-24 LAB — LIPOPROTEIN A (LPA): Lipoprotein (a): 294.9 nmol/L — ABNORMAL HIGH (ref <75.0)

## 2021-11-24 MED ORDER — CLOPIDOGREL BISULFATE 75 MG PO TABS
75.0000 mg | ORAL_TABLET | Freq: Every day | ORAL | 1 refills | Status: DC
  Filled 2021-11-24: qty 30, 30d supply, fill #0

## 2021-11-24 MED ORDER — EZETIMIBE 10 MG PO TABS
10.0000 mg | ORAL_TABLET | Freq: Every day | ORAL | 3 refills | Status: DC
  Filled 2021-11-24: qty 30, 30d supply, fill #0

## 2021-11-24 MED ORDER — SACUBITRIL-VALSARTAN 49-51 MG PO TABS
1.0000 | ORAL_TABLET | Freq: Two times a day (BID) | ORAL | 1 refills | Status: DC
  Filled 2021-11-24: qty 60, 30d supply, fill #0

## 2021-11-24 MED ORDER — ENTRESTO 24-26 MG PO TABS
1.0000 | ORAL_TABLET | Freq: Two times a day (BID) | ORAL | 0 refills | Status: DC
  Filled 2021-11-24: qty 60, 30d supply, fill #0

## 2021-11-24 MED ORDER — ATORVASTATIN CALCIUM 40 MG PO TABS
40.0000 mg | ORAL_TABLET | Freq: Every day | ORAL | 3 refills | Status: DC
  Filled 2021-11-24: qty 30, 30d supply, fill #0

## 2021-11-24 MED ORDER — CARVEDILOL 3.125 MG PO TABS
3.1250 mg | ORAL_TABLET | Freq: Two times a day (BID) | ORAL | 1 refills | Status: DC
  Filled 2021-11-24: qty 60, 30d supply, fill #0

## 2021-11-24 MED ORDER — EMPAGLIFLOZIN 10 MG PO TABS
10.0000 mg | ORAL_TABLET | Freq: Every day | ORAL | 1 refills | Status: DC
  Filled 2021-11-24: qty 30, 30d supply, fill #0

## 2021-11-24 MED ORDER — SPIRONOLACTONE 25 MG PO TABS
25.0000 mg | ORAL_TABLET | Freq: Every day | ORAL | 1 refills | Status: DC
  Filled 2021-11-24: qty 30, 30d supply, fill #0

## 2021-11-24 MED ORDER — TORSEMIDE 20 MG PO TABS
20.0000 mg | ORAL_TABLET | Freq: Every day | ORAL | 1 refills | Status: DC
  Filled 2021-11-24: qty 30, 30d supply, fill #0

## 2021-11-24 MED ORDER — TORSEMIDE 20 MG PO TABS: 20.0000 mg | ORAL_TABLET | Freq: Every day | ORAL | Status: DC

## 2021-11-24 NOTE — Plan of Care (Signed)
?  Problem: Clinical Measurements: ?Goal: Will remain free from infection ?Outcome: Progressing ?  ?

## 2021-11-24 NOTE — TOC Transition Note (Signed)
Transition of Care [facility]) - CM/SW Discharge Note   Patient Details  Name: Leslie Walker MRN: [id number] Date of Birth: 13-Jun-1965  [facility] of [facility] CM/SW Contact:  Zenon Mayo, RN Phone Number: 11/24/2021, 4:31 PM   Clinical Narrative:    Patient is for dc, previous NCM did Match and scheduled follow up apt for patient.      Barriers to Discharge: Continued Medical Work up   Patient Goals and CMS Choice Patient states their goals for this hospitalization and ongoing recovery are:: wants to get better      Discharge Placement                       Discharge Plan and Services   Discharge Planning Services: CM Consult                                 Social Determinants of Health (SDOH) Interventions     Readmission Risk Interventions     No data to display

## 2021-11-24 NOTE — Progress Notes (Signed)
   11/24/21 1000  Mobility  Activity Ambulated independently to bathroom  Level of Assistance Independent  Assistive Device None  Distance Ambulated (ft) 10 ft  Activity Response Tolerated well  Mobility Referral Yes  $Mobility charge 1 Mobility   Mobility Specialist Progress Note  Pt requesting to go to the BR. Had no c/o pain throughout ambulation. Left in BR with all needs meet.   [name] Mobility Specialist ]

## 2021-11-24 NOTE — Discharge Summary (Addendum)
Physician Discharge Summary  Donisha Hoch GHW:[id number] DOB: May 22, 1965 DOA: 11/18/2021  PCP: Elbert Ewings, FNP  Admit date: 11/18/2021 Discharge date: 11/24/2021  Time spent:35 minutes  Recommendations for Outpatient Follow-up:  Cardiology Dr. Aundra Dubin in 2 weeks, please check BMP in 1 week  Discharge Diagnoses:  Principal Problem:   Acute on chronic systolic heart failure (HCC)   Aortic insufficiency Acute hypoxic respiratory failure    essential hypertension   Hyperlipidemia   Coronary artery disease   Polysubstance abuse ([place])   History of CVA  Discharge Condition: Improved  Diet recommendation: Low-sodium, heart healthy  Filed Weights   11/22/21 0506 11/23/21 0400 11/24/21 0030  Weight: 65 kg 63.8 kg 61.1 kg    History of present illness:  Ms Pardo is a 56 year old with a history of HTN, hyperlipidemia, stroke,cypotogenic, LINQ, CAD, polysubstance abuse heroin/cocaine abuse, and chronic HFrEF.  Admitted with A/C HFrEF and Acute Respiratory Failure   Hospital Course:   Acute on chronic systolic CHF Mod to severe AR Echo with further reduction in LV systolic function with EF 20 to 25%, global hypokinesis,  aortic regurgitation moderate to severe.  -Followed by CHF team, diuresed with IV lasix, 7 L negative -LHC w/ stable CAD, high wedge pressure, low CI -on entresto, jardiance aldactone, coreg, starting torsemide tomorrow -Creatinine bumped to 1.8 today, discussed with Dr. Aundra Dubin, [name] dose decreased to 24/26 -Cardiac MRI with EF of 18%, preserved RV function -TEE noted moderate to severe AI, case was d/w structural heart team today, recommended med management and outpt FU -Follow-up with advanced heart failure team   Acute hypoxemic respiratory failure due to acute cardiogenic pulmonary edema. -improving, weaned off O2   Essential hypertension -improving, meds as above   Coronary artery disease PCI and DES 09/2020 to mid RCA,  Continue coreg, statin,  plavix -Elevated apolipoprotein A, referral to lipid clinic for PCSK9 inhibitor   Hyperlipidemia Continue with ezetimibe.    Polysubstance abuse [facility]) Patient with history of cocaine use. Will need support as outpatient.    H/o CVA -has loop recorder, continue statin and Plavix  Discharge Exam: Vitals:   11/24/21 0406 11/24/21 0410  BP: (!) 88/65 98/60  Pulse:  65  Resp: 12 14  Temp:  (!) 97.4 F (36.3 C)  SpO2:  91%   General exam: Appears calm and comfortable  Respiratory system: Clear to auscultation Cardiovascular system: S1 & S2 heard, RRR.  Abd: nondistended, soft and nontender.Normal bowel sounds heard. Central nervous system: Alert and oriented. No focal neurological deficits. Extremities: no edema Skin: No rashes Psychiatry:  Mood & affect appropriate.   Discharge Instructions    Allergies as of 11/24/2021   No Known Allergies      Medication List     STOP taking these medications    aspirin EC 81 MG tablet   furosemide 20 MG tablet Commonly known as: LASIX   gabapentin 100 MG capsule Commonly known as: NEURONTIN   metoprolol tartrate 50 MG tablet Commonly known as: LOPRESSOR   sacubitril-valsartan 97-103 MG Commonly known as: ENTRESTO Replaced by: [name] 24-26 MG       TAKE these medications    atorvastatin 40 MG tablet Commonly known as: LIPITOR Take 1 tablet (40 mg total) by mouth daily at 6 PM.   carvedilol 3.125 MG tablet Commonly known as: COREG Take 1 tablet (3.125 mg total) by mouth 2 (two) times daily with a meal.   clopidogrel 75 MG tablet Commonly known as: PLAVIX Take 1  tablet (75 mg total) by mouth daily.   empagliflozin 10 MG Tabs tablet Commonly known as: JARDIANCE Take 1 tablet (10 mg total) by mouth daily. Start taking on: November 25, 2021   Entresto 24-26 MG Generic drug: sacubitril-valsartan Take 1 tablet by mouth 2 (two) times daily. Replaces: sacubitril-valsartan 97-103 MG   ezetimibe 10 MG  tablet Commonly known as: ZETIA Take 1 tablet (10 mg total) by mouth daily.   spironolactone 25 MG tablet Commonly known as: ALDACTONE Take 1 tablet (25 mg total) by mouth daily. Start taking on: November 25, 2021   torsemide 20 MG tablet Commonly known as: DEMADEX Take 1 tablet (20 mg total) by mouth daily. Start taking on: November 25, 2021       No Known Allergies  Follow-up Information     [facility] Follow up on 11/29/2021.   Specialty: Cardiology Why: at 3:30. Bring all  medications Contact information: [address] [id number] [place] [phone]        Primary Care at [facility]. Go on 12/23/2021.   Specialty: Family Medicine Why: '@2'$ :00pm please arrive 1:45pm Contact information: [address], [place] [phone]                 The results of significant diagnostics from this hospitalization (including imaging, microbiology, ancillary and laboratory) are listed below for reference.    Significant Diagnostic Studies: MR CARDIAC VELOCITY FLOW MAP  Result Date: 11/23/2021 : Reported with separate cardiac MRI report Electronically Signed   By: Loralie Champagne M.D.   On: 11/23/2021 10:45   MR CARDIAC VELOCITY FLOW MAP  Result Date: 11/23/2021 : Reported with separate cardiac MRI report Electronically Signed   By: Loralie Champagne M.D.   On: 11/23/2021 10:44   MR CARDIAC MORPHOLOGY W WO CONTRAST  Result Date: 11/23/2021 CLINICAL DATA:  Cardiomyopathy of uncertain etiology EXAM: CARDIAC MRI TECHNIQUE: The patient was scanned on a 1.5 Tesla GE magnet. A dedicated cardiac coil was used. Functional imaging was done using Fiesta sequences. 2,3, and 4 chamber views were done to assess for RWMA's. Modified Simpson's rule using a short axis stack was used to calculate an ejection fraction on a dedicated work Conservation officer, nature. The  patient received 8 cc of Gadavist. After 10 minutes inversion recovery sequences were used to assess for infiltration and scar tissue. FINDINGS: Limited images of the lung fields showed no gross abnormalities. Trivial left pleural effusion. Small to moderate pericardial effusion primarily adjacent to RV, no tamponade. Moderately dilated left ventricle with normal wall thickness. Diffuse hypokinesis, EF 18%. Normal right ventricular size with mildly decreased systolic function, EF 16%. Borderline dilated left atrium. Normal right atrium. No significant mitral regurgitation noted. The aortic valve is thickened and bicuspid with fusion of the non and right coronary cusps, regurgitant fraction 38% suggesting severe aortic insufficiency. MEASUREMENTS: MEASUREMENTS LVEDV 275 mL LVEDVi 133 mL/m2 LVSV 49 mL LVEF 18% RVEDV 78 mL RVEDVi 46 mL/m2 RVSV 37 mL RVEF 48% Aortic forward volume 46 mL Regurgitant fraction 38% DELAYED ENHANCEMENT IMAGING: DELAYED ENHANCEMENT IMAGING Small areas of subepicardial late gadoliniium enhancement (LGE) at the basal anteroseptal and basal inferoseptal RV insertion sites. Patchy mid-wall LGE in the basal and apical inferolateral wall segments. IMPRESSION: 1.  Small-moderate pericardial effusion without tamponade. 2.  Moderately dilated LV with diffuse hypokinesis, EF 18%. 3.  Normal RV size with mildly decreased systolic function, EF 07%. 4.  Bicuspid aortic valve with severe aortic insufficiency, regurgitant fraction 38%. 5. Non-coronary LGE pattern. RV insertion LGE is nonspecific and suggestive of pressure/volume overload. The mid-wall LGE in the inferolateral wall could be due to prior myocarditis though interestingly patient has had significant coronary disease in the LCx chronically. Dalton Mclean Electronically Signed   By: Loralie Champagne M.D.   On: 11/23/2021 10:44   CARDIAC CATHETERIZATION  Result Date: 11/22/2021   Mid Cx lesion is 75% stenosed.   Prox RCA lesion is 30% stenosed.    1st Diag lesion is 45% stenosed.   Prox LAD lesion is 50% stenosed.   Mid LAD lesion is 40% stenosed.   Non-stenotic Mid RCA lesion was previously treated. 1. Elevated PCWP and LVEDP but normal RV pressure. 2. Moderate pulmonary venous hypertension. 3. CI low by thermo, preserved by Fick. 4. There is a 75% mid LCx stenosis, unchanged from the past. Medical management.   ECHOCARDIOGRAM COMPLETE  Result Date: 11/19/2021    ECHOCARDIOGRAM REPORT   Patient Name:   MALI EPPARD Date of Exam: 11/19/2021 Medical Rec #:  [id number]      Height:       64.0 in Accession #:    [id number]     Weight:       155.4 lb Date of Birth:  1965-08-12      BSA:          1.757 m Patient Age:    45 years       BP:           149/80 mmHg Patient Gender: F              HR:           80 bpm. Exam Location:  Inpatient Procedure: 2D Echo, Color Doppler and Cardiac Doppler Indications:    CHF- Acute Systolic  History:        Patient has prior history of Echocardiogram examinations, most                 recent 10/10/2020. Stroke; Risk Factors:Hypertension.  Sonographer:    Memory Argue Referring Phys: [id number] [place]  1. Severe LV dysfunction; moderate to severe AI; mildly elevated gradient across aortic valve suggests mild AS but can partially be explained by AI. Compared to previous study, LV function is worse.  2. Left ventricular ejection fraction, by estimation, is 20 to 25%. The left ventricle has severely decreased function. The left ventricle demonstrates global hypokinesis. The left ventricular internal cavity size was moderately dilated. Left ventricular diastolic parameters are consistent with Grade II diastolic dysfunction (pseudonormalization). Elevated left atrial pressure.  3. Right ventricular systolic function is normal. The right ventricular size is normal.  4. Left atrial size was moderately dilated.  5. A small pericardial effusion is present.  6. The mitral valve is normal in structure. Mild mitral  valve regurgitation. No evidence of mitral stenosis.  7. The aortic valve is calcified. Aortic valve regurgitation is moderate to severe. No aortic stenosis is present.  8. The inferior vena cava is normal in size with greater than 50% respiratory variability, suggesting right atrial pressure of 3 mmHg. FINDINGS  Left Ventricle: Left ventricular ejection fraction, by estimation, is 20 to 25%. The left ventricle has severely decreased function. The left ventricle demonstrates global hypokinesis. The left ventricular internal cavity size was moderately dilated. There is no left ventricular hypertrophy. Left ventricular diastolic parameters are consistent with Grade II diastolic dysfunction (pseudonormalization). Elevated left  atrial pressure. Right Ventricle: The right ventricular size is normal. Right ventricular systolic function is normal. Left Atrium: Left atrial size was moderately dilated. Right Atrium: Right atrial size was normal in size. Pericardium: A small pericardial effusion is present. Mitral Valve: The mitral valve is normal in structure. Mild mitral valve regurgitation. No evidence of mitral valve stenosis. Tricuspid Valve: The tricuspid valve is normal in structure. Tricuspid valve regurgitation is trivial. No evidence of tricuspid stenosis. Aortic Valve: The aortic valve is calcified. Aortic valve regurgitation is moderate to severe. Aortic regurgitation PHT measures 418 msec. No aortic stenosis is present. Aortic valve mean gradient measures 15.0 mmHg. Aortic valve peak gradient measures 28.3 mmHg. Aortic valve area, by VTI measures 1.31 cm. Pulmonic Valve: The pulmonic valve was normal in structure. Pulmonic valve regurgitation is not visualized. No evidence of pulmonic stenosis. Aorta: The aortic root is normal in size and structure. Venous: The inferior vena cava is normal in size with greater than 50% respiratory variability, suggesting right atrial pressure of 3 mmHg. IAS/Shunts: No atrial  level shunt detected by color flow Doppler. Additional Comments: Severe LV dysfunction; moderate to severe AI; mildly elevated gradient across aortic valve suggests mild AS but can partially be explained by AI. Compared to previous study, LV function is worse.  LEFT VENTRICLE PLAX 2D LVIDd:         6.00 cm      Diastology LVIDs:         5.30 cm      LV e' medial:    7.46 cm/s LV PW:         1.10 cm      LV E/e' medial:  18.2 LV IVS:        1.00 cm      LV e' lateral:   8.39 cm/s LVOT diam:     2.40 cm      LV E/e' lateral: 16.2 LV SV:         60 LV SV Index:   34 LVOT Area:     4.52 cm  LV Volumes (MOD) LV vol d, MOD A2C: 187.0 ml LV vol d, MOD A4C: 195.0 ml LV vol s, MOD A2C: 131.0 ml LV vol s, MOD A4C: 150.0 ml LV SV MOD A2C:     56.0 ml LV SV MOD A4C:     195.0 ml LV SV MOD BP:      44.3 ml RIGHT VENTRICLE TAPSE (M-mode): 1.8 cm LEFT ATRIUM             Index        RIGHT ATRIUM           Index LA diam:        3.50 cm 1.99 cm/m   RA Area:     12.70 cm LA Vol (A2C):   80.9 ml 46.03 ml/m  RA Volume:   27.40 ml  15.59 ml/m LA Vol (A4C):   68.0 ml 38.69 ml/m LA Biplane Vol: 76.9 ml 43.76 ml/m  AORTIC VALVE AV Area (Vmax):    1.37 cm AV Area (Vmean):   1.28 cm AV Area (VTI):     1.31 cm AV Vmax:           266.00 cm/s AV Vmean:          179.333 cm/s AV VTI:            0.459 m AV Peak Grad:      28.3 mmHg AV Mean Grad:  15.0 mmHg LVOT Vmax:         80.50 cm/s LVOT Vmean:        50.700 cm/s LVOT VTI:          0.133 m LVOT/AV VTI ratio: 0.29 AI PHT:            418 msec  AORTA Ao Root diam: 2.80 cm Ao Asc diam:  2.80 cm MITRAL VALVE MV Area (PHT): 6.71 cm     SHUNTS MV Decel Time: 113 msec     Systemic VTI:  0.13 m MR Peak grad: 37.5 mmHg     Systemic Diam: 2.40 cm MR Vmax:      306.00 cm/s MV E velocity: 136.00 cm/s MV A velocity: 71.80 cm/s MV E/A ratio:  1.89 Kirk Ruths MD Electronically signed by Kirk Ruths MD Signature Date/Time: 11/19/2021/2:51:53 PM    Final    DG Chest Portable 1  View  Result Date: 11/19/2021 CLINICAL DATA:  Provided history: Shortness of breath. EXAM: PORTABLE CHEST 1 VIEW COMPARISON:  CT angiogram chest 11/18/2021. Chest radiographs 11/18/2021. FINDINGS: A loop recorder device overlies the left chest. Mild cardiomegaly, unchanged. Aortic atherosclerosis. Interstitial and patchy airspace opacities, greatest within the left lung and right lung base. Findings have progressed from yesterday's chest radiographs and are compatible with pulmonary edema. Superimposed pneumonia cannot be excluded. Persistent small bilateral pleural effusions. No acute bony abnormality identified. IMPRESSION: 1. Mild cardiomegaly. 2. Pulmonary edema, progressed from yesterday's chest radiographs. Superimposed pneumonia cannot be excluded. 3. Persistent small bilateral pleural effusions. 4. Aortic Atherosclerosis (ICD10-I70.0). Electronically Signed   By: Kellie Simmering D.O.   On: 11/19/2021 11:30   CT Angio Chest PE W and/or Wo Contrast  Result Date: 11/18/2021 CLINICAL DATA:  Shortness of breath for the past week. EXAM: CT ANGIOGRAPHY CHEST WITH CONTRAST TECHNIQUE: Multidetector CT imaging of the chest was performed using the standard protocol during bolus administration of intravenous contrast. Multiplanar CT image reconstructions and MIPs were obtained to evaluate the vascular anatomy. RADIATION DOSE REDUCTION: This exam was performed according to the departmental dose-optimization program which includes automated exposure control, adjustment of the mA and/or kV according to patient size and/or use of iterative reconstruction technique. CONTRAST:  23m OMNIPAQUE IOHEXOL 350 MG/ML SOLN COMPARISON:  11/18/2021 chest radiograph.  CTA chest 10/09/2020 FINDINGS: Cardiovascular: The quality of this exam for evaluation of pulmonary embolism is good. The bolus is well timed. No evidence of pulmonary embolism. Aortic atherosclerosis. The aorta is not opacified, so dissection cannot be evaluated for  excluded. No aortic aneurysm. Moderate cardiomegaly, without pericardial effusion. Three vessel coronary artery calcification. Mediastinum/Nodes: Multiple small low jugular/supraclavicular nodes. No middle mediastinal adenopathy. Mild right infrahilar adenopathy at 11 mm on 73/5 is new. Left infrahilar adenopathy at 1.0 cm on 61/5 is also new. Prevascular adenopathy at 1.7 cm on 43/5, new. Lungs/Pleura: Small bilateral pleural effusions, slightly larger on the right. Diffuse ground-glass opacity with smooth septal thickening, consistent with interstitial edema. This is most significant in the left upper lobe, accounting for the plain film abnormality. Upper Abdomen: Normal imaged portions of the liver, stomach. Musculoskeletal: No acute osseous abnormality. Review of the MIP images confirms the above findings. IMPRESSION: 1.  No evidence of pulmonary embolism. 2. Moderate congestive heart failure with small bilateral pleural effusions. 3. Development of mild thoracic adenopathy, most significant in the prevascular station. Most likely related to congestive failure. This could be re-evaluated with contrast-enhanced chest CT at 3-6 months to confirm resolution. 4. Age advanced coronary  artery atherosclerosis. Recommend assessment of coronary risk factors. 5.  Aortic Atherosclerosis (ICD10-I70.0). Electronically Signed   By: Abigail Miyamoto M.D.   On: 11/18/2021 13:52   DG Chest 2 View  Result Date: 11/18/2021 CLINICAL DATA:  Shortness of breath, altered mental status EXAM: CHEST - 2 VIEW COMPARISON:  Chest radiograph and CT 10/06/2020 FINDINGS: A loop recorder is again seen. The heart is mildly enlarged. The upper mediastinal contours are normal. There is vascular congestion without definite overt pulmonary edema. There are small bilateral pleural effusions with adjacent atelectasis. There is hazy opacity in the left upper lobe. There is no pneumothorax There is no acute osseous abnormality. IMPRESSION: 1. Hazy  opacity in the left upper lobe suspicious for pneumonia and small bilateral pleural effusions. Recommend follow-up radiographs in 3-4 weeks to ensure resolution 2. Mild cardiomegaly. Electronically Signed   By: Valetta Mole M.D.   On: 11/18/2021 11:45   CUP PACEART REMOTE DEVICE CHECK  Result Date: 11/17/2021 ILR summary report received. Battery status OK. Normal device function. No new symptom, brady, or pause episodes. Monthly summary reports and ROV/PRN 54 tachy events, these have been false due to oversensing 16 AF events, longest duration ~4mn, these have also been false, longer episode was routed, no OYelmLA   Microbiology: Recent Results (from the past 240 hour(s))  Culture, blood (Routine X 2) w Reflex to ID Panel     Status: None (Preliminary result)   Collection Time: 11/22/21  2:57 PM   Specimen: BLOOD LEFT ARM  Result Value Ref Range Status   Specimen Description BLOOD LEFT ARM  Final   Special Requests IN PEDIATRIC BOTTLE Blood Culture adequate volume  Final   Culture   Final    NO GROWTH < 24 HOURS [facility]formed at [facility] [address] [place] [address]   Report Status PENDING  Incomplete  Culture, blood (Routine X 2) w Reflex to ID Panel     Status: None (Preliminary result)   Collection Time: 11/22/21  3:43 PM   Specimen: Left Antecubital; Blood  Result Value Ref Range Status   Specimen Description LEFT ANTECUBITAL BLOOD  Final   Special Requests IN PEDIATRIC BOTTLE Blood Culture adequate volume  Final   Culture   Final    NO GROWTH < 24 HOURS Performed at [facility] [address], [place] [id number]   Report Status PENDING  Incomplete     Labs: Basic Metabolic Panel: Recent Labs  Lab 11/19/21 0339 11/20/21 0041 11/21/21 0034 11/22/21 0026 11/22/21 1400 11/23/21 0045 11/24/21 1018  NA 138 138 135 138 137  136 138 135  K 3.7 3.6 4.6 4.5 4.3  4.3 4.2 4.0  CL 106 101 96* 103  --  103 101  CO2 '27 30 26 29  '$ --  23 24   GLUCOSE 107* 109* 129* 108*  --  99 113*  BUN '13 12 16 9  '$ --  12 26*  CREATININE 1.02* 0.98 1.12* 1.03*  --  1.09* 1.83*  CALCIUM 8.1* 8.5* 8.5* 8.7*  --  8.6* 8.7*  MG 2.0 2.0  --  2.2  --   --   --    Liver Function Tests: Recent Labs  Lab 11/19/21 0339  AST 26  ALT 18  ALKPHOS 53  BILITOT 0.4  PROT 5.4*  ALBUMIN 2.8*   No results for input(s): "LIPASE", "AMYLASE" in the last 168 hours. No results for input(s): "AMMONIA" in  the last 168 hours. CBC: Recent Labs  Lab 11/18/21 1107 11/19/21 0339 11/22/21 1400  WBC 6.1 5.3  --   NEUTROABS  --  1.8  --   HGB 13.3 12.0 12.6  12.6  HCT 36.6 33.3* 37.0  37.0  MCV 81.7 83.0  --   PLT 354 303  --    Cardiac Enzymes: No results for input(s): "CKTOTAL", "CKMB", "CKMBINDEX", "TROPONINI" in the last 168 hours. BNP: BNP (last 3 results) Recent Labs    11/18/21 1226  BNP 2,512.0*    ProBNP (last 3 results) No results for input(s): "PROBNP" in the last 8760 hours.  CBG: No results for input(s): "GLUCAP" in the last 168 hours.     Signed:  Domenic Polite MD.  [facility] 11/24/2021, 11:46 AM

## 2021-11-24 NOTE — Progress Notes (Signed)
Pt declined ambulation due to walking earlier, initiated teach-back education, pt understands importance of daily weights, recording/reporting symptoms, low na diet. Pt received additional materials on low na food content. Pt has been referred to CRPII.  Christen Bame, MS EP 11/24/2021 12:22 PM

## 2021-11-24 NOTE — Progress Notes (Signed)
Patient ID: Leslie Walker, female   DOB: [birth year], 56 y.o.   MRN: [id number]     Advanced Heart Failure Rounding Note  PCP-Cardiologist: Pixie Casino, MD   Subjective:   10/22/21  RHC/LHC - Post cath diuresed with IV lasix.  1. Elevated PCWP and LVEDP but normal RV pressure.  2. Moderate pulmonary venous hypertension.  3. CI low by thermo, preserved by Fick.  4. There is a 75% mid LCx stenosis, unchanged from the past.   cMRI:  1.  Small-moderate pericardial effusion without tamponade. 2.  Moderately dilated LV with diffuse hypokinesis, EF 18%. 3.  Normal RV size with mildly decreased systolic function, EF 40%. 4. Bicuspid aortic valve with severe aortic insufficiency, regurgitant fraction 38%. 5. Non-coronary LGE pattern. RV insertion LGE is nonspecific and suggestive of pressure/volume overload. The mid-wall LGE in the inferolateral wall could be due to prior myocarditis though interestingly patient has had significant coronary disease in the LCx chronically.  TEE: Moderately dilated LV with normal wall thickness.  Global hypokinesis with EF 20%. No LV thrombus. Normal right ventricular size with mildly decreased systolic function.  Mild left atrial enlargement.  No LA appendage thrombus.  Normal right atrium.  No ASD/PFO by color doppler.  Mild mitral regurgitation.  Trivial tricuspid regurgitation.  Bicuspid aortic valve with fused right and non-coronary cusps.  No aortic stenosis, mean gradient 5 mmHg.  There was moderate-severe aortic insufficiency with vena contracta 0.58 cm, PHT 211 msec.  No definite flow reversal in the descending thoracic aorta.   No complaints today, denies dyspnea.    Objective:   Weight Range: 61.1 kg Body mass index is 23.12 kg/m.   Vital Signs:   Temp:  [97.1 F (36.2 C)-99.3 F (37.4 C)] 97.4 F (36.3 C) (11/08 0410) Pulse Rate:  [65-94] 65 (11/08 0410) Resp:  [8-30] 14 (11/08 0410) BP: (78-196)/(53-110) 98/60 (11/08 0410) SpO2:  [91 %-100  %] 91 % (11/08 0410) Weight:  [61.1 kg] 61.1 kg (11/08 0030) Last BM Date : 11/23/21  Weight change: Filed Weights   11/22/21 0506 11/23/21 0400 11/24/21 0030  Weight: 65 kg 63.8 kg 61.1 kg    Intake/Output:   Intake/Output Summary (Last 24 hours) at 11/24/2021 0928 Last data filed at 11/24/2021 0411 Gross per 24 hour  Intake 790 ml  Output --  Net 790 ml      Physical Exam    General: NAD Neck: No JVD, no thyromegaly or thyroid nodule.  Lungs: Clear to auscultation bilaterally with normal respiratory effort. CV: Nondisplaced PMI.  Heart regular S1/S2, no S3/S4, 3/6 early SEM RUSB.  No peripheral edema.   Abdomen: Soft, nontender, no hepatosplenomegaly, no distention.  Skin: Intact without lesions or rashes.  Neurologic: Alert and oriented x 3.  Psych: Normal affect. Extremities: No clubbing or cyanosis.  HEENT: Normal.   Telemetry   NSR (personally reviewed)   EKG    N/A   Labs    CBC Recent Labs    11/22/21 1400  HGB 12.6  12.6  HCT 37.0  98.1   Basic Metabolic Panel Recent Labs    11/22/21 0026 11/22/21 1400 11/23/21 0045  NA 138 137  136 138  K 4.5 4.3  4.3 4.2  CL 103  --  103  CO2 29  --  23  GLUCOSE 108*  --  99  BUN 9  --  12  CREATININE 1.03*  --  1.09*  CALCIUM 8.7*  --  8.6*  MG 2.2  --   --  Liver Function Tests No results for input(s): "AST", "ALT", "ALKPHOS", "BILITOT", "PROT", "ALBUMIN" in the last 72 hours. No results for input(s): "LIPASE", "AMYLASE" in the last 72 hours. Cardiac Enzymes No results for input(s): "CKTOTAL", "CKMB", "CKMBINDEX", "TROPONINI" in the last 72 hours.  BNP: BNP (last 3 results) Recent Labs    11/18/21 1226  BNP 2,512.0*    ProBNP (last 3 results) No results for input(s): "PROBNP" in the last 8760 hours.   D-Dimer No results for input(s): "DDIMER" in the last 72 hours. Hemoglobin A1C No results for input(s): "HGBA1C" in the last 72 hours. Fasting Lipid Panel No results for  input(s): "CHOL", "HDL", "LDLCALC", "TRIG", "CHOLHDL", "LDLDIRECT" in the last 72 hours. Thyroid Function Tests No results for input(s): "TSH", "T4TOTAL", "T3FREE", "THYROIDAB" in the last 72 hours.  Invalid input(s): "FREET3"  Other results:   Imaging    No results found.   Medications:     Scheduled Medications:  atorvastatin  40 mg Oral Daily   carvedilol  3.125 mg Oral BID WC   clopidogrel  75 mg Oral Daily   empagliflozin  10 mg Oral Daily   enoxaparin (LOVENOX) injection  40 mg Subcutaneous Q24H   ezetimibe  10 mg Oral Daily   sacubitril-valsartan  1 tablet Oral BID   sodium chloride flush  3 mL Intravenous Q12H   sodium chloride flush  3 mL Intravenous Q12H   spironolactone  25 mg Oral Daily    Infusions:  sodium chloride      PRN Medications: sodium chloride, acetaminophen, ondansetron (ZOFRAN) IV, sodium chloride flush    Patient Profile   Ms Govea is a 56 year old with a history of HTN, hyperlipidemia, stroke,cypotogenic, LINQ, CAD, polysubstance abuse heroin/cocaine abuse, and chronic HFrEF.    Admitted with A/C HFrEF and Acute Respiratory Failure     Assessment/Plan   1. Acute on chronic systolic CHF: Prior echo with EF 45-50%.  Echo this admission with EF 20-25%, global hypokinesis, moderate LV dilation, normal RV, mod-severe AI.  RHC showed significantly elevated PCWP and LVEDP but normal RA pressure.  CI 2.4 Fick/2.0 thermo.  Cause of cardiomyopathy uncertain, CAD does not appear to have progressed (75% mLCx stenosis was also noted on prior cath).  Possibly due to substance abuse/cocaine though by cardiac MRI, prior myocarditis is also a concern (non-coronary LGE noted).  LV EF 18% on cMRI with relatively preserved RV function.  Aortic insufficiency also likely plays a role. She looks euvolemic today on exam, no BMET yet.  - Start torsemide 20 mg daily tomorrow.  - Need BMET stat.  - Coreg 3.125 mg bid.  - Jardiance 10 mg daily - Entresto 49/51  bid - Spironolactone 25 daily.  2. CAD: DES to RCA in 9/22.  No chest pain prior to this admission. Troponin mildly elevated with no trend, likely demand ischemia with volume overload.  Cath this admission with patent RCA stent, 50% proximal LAD, 75% mid LCx.  The LCx stenosis appeared similar to the prior cath.  Given lack of chest pain, would manage medically. Lp(a) markedly elevated with age-advanced CAD.  - Continue Plavix 75 daily.  - Continue statin, check lipids today. - With elevated Lp(a), will need to be aggressive with lipid management and will try to get her into lipid clinic for consideration of PCSK9 inhibitor.   3. Aortic valve disorder: Moderate-severe AI on echo. TEE done, she has a bicuspid aortic valve with fusion of right and noncoronary cusps. There is no significant  stenosis and no evidence for endocarditis, moderate-severe AI by TEE.  By cardiac MRI, aortic valve regurgitant fraction was 38% which suggests severe AI.   - I will ask Dr. Burt Knack to review TEE, ?candidate for TAVR.  Not much calcium on valve and it is bicuspid so questionable.  4. Polysubstance abuse: Cocaine/heroin.  She does not inject the heroin.  - Needs to quit. 5. CVA: 2020, has LINQ monitor.   I think she can go home today after labs are back and Dr. Burt Knack has reviewed imaging.  Will need close followup in CHF clinic.  She should have paramedicine followup.  She will need help with her meds.  Meds for discharge: torsemide 20 mg daily (start tomorrow), atorvastatin 40 mg daily, Plavix 75 daily, Entresto 49/51 bid, spironolactone 25 daily, Jardiance 10 daily, Coreg 3.125 mg bid, Zetia 10 daily.   Length of Stay: 6  Loralie Champagne, MD  11/24/2021, 9:28 AM  Advanced Heart Failure Team Pager [phone] (M-F; 7a - 5p)  Please contact [facility] for night-coverage after hours (5p -7a ) and weekends on [email or web address]

## 2021-11-24 NOTE — Progress Notes (Signed)
   11/24/21 1052  Mobility  Activity Ambulated independently in hallway;Ambulated with assistance in hallway  Level of Assistance Modified independent, requires aide device or extra time  Assistive Device None  Distance Ambulated (ft) 290 ft  Activity Response Tolerated well  Mobility Referral Yes  $Mobility charge 1 Mobility   Mobility Specialist Progress Note  Received pt in chair having no complaints and agreeable to mobility. Pt was asymptomatic throughout ambulation and returned to room w/o fault. Left in chair w/ call bell in reach and all needs met.   [name] Mobility Specialist

## 2021-11-26 ENCOUNTER — Telehealth (HOSPITAL_COMMUNITY): Payer: Self-pay

## 2021-11-26 ENCOUNTER — Encounter (HOSPITAL_COMMUNITY): Payer: Self-pay | Admitting: Cardiology

## 2021-11-26 NOTE — Telephone Encounter (Signed)
Advanced Heart Failure Patient Advocate Encounter  Application for Entresto faxed to [employer] on 11/26/2021. Application form attached to patient chart.  Clista Bernhardt, CPhT Rx Patient Advocate Phone: [phone]

## 2021-11-26 NOTE — Telephone Encounter (Signed)
Advanced Heart Failure Patient Advocate Encounter  Application for Jardiance faxed to [facility] on 11/26/2021. Application form attached to patient chart.  Clista Bernhardt, CPhT Rx Patient Advocate Phone: [phone]

## 2021-11-27 LAB — CULTURE, BLOOD (ROUTINE X 2)
Culture: NO GROWTH
Culture: NO GROWTH
Special Requests: ADEQUATE
Special Requests: ADEQUATE

## 2021-11-29 ENCOUNTER — Encounter (HOSPITAL_COMMUNITY): Payer: No Typology Code available for payment source

## 2021-12-02 ENCOUNTER — Telehealth (HOSPITAL_COMMUNITY): Payer: Self-pay | Admitting: Emergency Medicine

## 2021-12-02 NOTE — Telephone Encounter (Signed)
Spoke with Ms. Muhlestein and scheduled home visit on Friday 11/17 @ 8:30    Skipper Cliche [phone] 12/02/2021

## 2021-12-03 ENCOUNTER — Other Ambulatory Visit (HOSPITAL_COMMUNITY): Payer: Self-pay | Admitting: Emergency Medicine

## 2021-12-03 NOTE — Progress Notes (Signed)
Paramedicine Encounter    Patient ID: Leslie Walker, female    DOB: July 04, 1965, 56 y.o.   MRN: [id number]   LMP  (LMP Unknown) Comment: Pt unsure why she no longer gets her period Weight yesterday-not taken Last visit weight-not taken  Patient Care Team: Elbert Ewings, FNP as PCP - General (Nurse Practitioner) Pixie Casino, MD as PCP - Cardiology (Cardiology)  Patient Active Problem List   Diagnosis Date Noted  . Coronary artery disease 11/20/2021  . Elevated troponin 11/19/2021  . Heart failure ([place]) 11/19/2021  . Acute on chronic systolic heart failure ([place]) 11/18/2021  . Chronic systolic heart failure ([place])   . Chest pain, precordial 10/09/2020  . Cocaine use 10/09/2020  . NSTEMI (non-ST elevated myocardial infarction) ([place]) 10/09/2020  . Polysubstance abuse ([place]) 10/09/2020  . Hyperlipidemia 10/09/2020  . Lung nodule 10/09/2020  . Tobacco dependence 10/09/2020  . Screening breast examination 09/04/2018  . Heroin dependence ([place]) 06/29/2018  . TIA (transient ischemic attack) 06/24/2018  . Nail fungus 04/20/2015  . Porokeratosis 04/20/2015  . History of ulceration 04/20/2015  . Foot ulcer ([place]) 02/17/2014  . Cellulitis of foot, right 02/17/2014  . Chronic pain syndrome 02/17/2014  . Essential hypertension 02/17/2014    Current Outpatient Medications:  .  atorvastatin (LIPITOR) 40 MG tablet, Take 1 tablet (40 mg total) by mouth daily at 6 PM., Disp: 30 tablet, Rfl: 3 .  carvedilol (COREG) 3.125 MG tablet, Take 1 tablet (3.125 mg total) by mouth 2 (two) times daily with a meal., Disp: 60 tablet, Rfl: 1 .  clopidogrel (PLAVIX) 75 MG tablet, Take 1 tablet (75 mg total) by mouth daily., Disp: 30 tablet, Rfl: 1 .  empagliflozin (JARDIANCE) 10 MG TABS tablet, Take 1 tablet (10 mg total) by mouth daily., Disp: 30 tablet, Rfl: 1 .  ezetimibe (ZETIA) 10 MG tablet, Take 1 tablet (10 mg total) by mouth daily., Disp: 30 tablet, Rfl: 3 .  sacubitril-valsartan (ENTRESTO) 24-26  MG, Take 1 tablet by mouth 2 (two) times daily., Disp: 60 tablet, Rfl: 0 .  spironolactone (ALDACTONE) 25 MG tablet, Take 1 tablet (25 mg total) by mouth daily., Disp: 30 tablet, Rfl: 1 .  torsemide (DEMADEX) 20 MG tablet, Take 1 tablet (20 mg total) by mouth daily., Disp: 30 tablet, Rfl: 1 No Known Allergies    Social History   Socioeconomic History  . Marital status: Single    Spouse name: Not on file  . Number of children: 3  . Years of education: Not on file  . Highest education level: 12th grade  Occupational History  . Not on file  Tobacco Use  . Smoking status: Some Days    Types: Cigars  . Smokeless tobacco: Never  Vaping Use  . Vaping Use: Never used  Substance and Sexual Activity  . Alcohol use: Yes    Comment: social drinking  . Drug use: Not Currently    Types: Marijuana, Cocaine    Comment: 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  . Sexual activity: Not Currently    Birth control/protection: Post-menopausal  Other Topics Concern  . Not on file  Social History Narrative  . Not on file   Social Determinants of Health   Financial Resource Strain: High Risk (11/13/2020)   Overall Financial Resource Strain (CARDIA)   . Difficulty of Paying Living Expenses: Hard  Food Insecurity: No Food Insecurity (11/19/2021)   Hunger Vital Sign   . Worried About Charity fundraiser in the Last Year: Never  true   . Ran Out of Food in the Last Year: Never true  Transportation Needs: No Transportation Needs (11/19/2021)   PRAPARE - Transportation   . Lack of Transportation (Medical): No   . Lack of Transportation (Non-Medical): No  Physical Activity: Inactive (02/21/2017)   Exercise Vital Sign   . Days of Exercise per Week: 0 days   . Minutes of Exercise per Session: 0 min  Stress: No Stress Concern Present (02/21/2017)   [place]   . Feeling of Stress : Not at all  Social Connections: Somewhat Isolated (02/21/2017)    Social Connection and Isolation Panel [NHANES]   . Frequency of Communication with Friends and Family: More than three times a week   . Frequency of Social Gatherings with Friends and Family: Once a week   . Attends Religious Services: 1 to 4 times per year   . Active Member of Clubs or Organizations: No   . Attends Archivist Meetings: Never   . Marital Status: Never married  Intimate Partner Violence: Not At Risk (11/19/2021)   Humiliation, Afraid, Rape, and Kick questionnaire   . Fear of Current or Ex-Partner: No   . Emotionally Abused: No   . Physically Abused: No   . Sexually Abused: No    Physical Exam      Future Appointments  Date Time Provider [place]  12/23/2021  2:00 PM Dorna Mai, MD P[facility]-P[facility] None  12/27/2021  7:05 AM [facility] DEVICE REMOTES [facility]  01/31/2022  7:05 AM [facility] DEVICE REMOTES [facility]  03/07/2022  7:05 AM [facility] DEVICE REMOTES [facility]  04/11/2022  7:05 AM [facility] DEVICE REMOTES [facility]       [name], EMT-P-Paramedic [place] [facility]  12/03/21  SOCIAL/MEDICAL BARRIERS:  PHARMACY USED ***  PCP***   INSURANCE***  MED ISSUES:  AFFORDABILITYnot affordable  PT ASSIST APPS NEEDED***  SOURCE OF INCOME none  TRANSPORTATIONnone  FOOD INSECURITIES/NEEDSnone FOOD STAMPSno  REVIEWED DIET/FLUID/SALT RESTRICTIONSyes   RENT/OWN HOME ISSUES daughter rent  SOCIAL SUPPORT yes  SAFETY/DOMESTIC ISSUES none  SUBSTANCE ABUSEyes  DAILY WEIGHTSyes  EDUCATE ON DISEASE PROCESS/SYMPTOMS/PURPOSES OF MEDSyes

## 2021-12-06 NOTE — Telephone Encounter (Signed)
Advanced Heart Failure Patient Advocate Encounter  Patient was approved to receive Jardiance from [place] Effective 11/26/2021 to 11/26/2022 Determination letter has been added to patient chart.

## 2021-12-08 ENCOUNTER — Other Ambulatory Visit (HOSPITAL_COMMUNITY): Payer: Self-pay | Admitting: Emergency Medicine

## 2021-12-08 ENCOUNTER — Telehealth (HOSPITAL_COMMUNITY): Payer: Self-pay | Admitting: Licensed Clinical Social Worker

## 2021-12-08 NOTE — Telephone Encounter (Signed)
Advanced Heart Failure Patient Advocate Encounter  Received notification from [employer] that POI is needed before determination can be made. Spoke to patient on the phone, she will bring 2022 W2 with her to upcoming appointment on 11/28.

## 2021-12-08 NOTE — Telephone Encounter (Signed)
Advanced Heart Failure Patient Advocate Encounter  Paramed had visit with patient today, provided POI. Faxed to [employer] 12/08/21

## 2021-12-08 NOTE — Telephone Encounter (Signed)
H&V Care Navigation CSW Progress Note  Clinical Social Worker consulted by [employer] to assist in arranging transportation for pt to come to appt on Tuesday in clinic.  CSW able to arrange uber through [place].   SDOH Screenings   Food Insecurity: No Food Insecurity (11/19/2021)  Housing: Low Risk  (11/19/2021)  Transportation Needs: No Transportation Needs (11/19/2021)  Utilities: Not At Risk (11/19/2021)  Financial Resource Strain: High Risk (11/13/2020)  Physical Activity: Inactive (02/21/2017)  Social Connections: Somewhat Isolated (02/21/2017)  Stress: No Stress Concern Present (02/21/2017)  Tobacco Use: High Risk (11/26/2021)    Jorge Ny, [facility] Desk#: [phone] Cell#: [phone]

## 2021-12-08 NOTE — Progress Notes (Signed)
Paramedicine Encounter    Patient ID: Leslie Walker, female    DOB: April 01, 1965, 56 y.o.   MRN: [id number]   BP 110/78 (BP Location: Left Arm, Patient Position: Sitting, Cuff Size: Normal)   Pulse 76   Resp 14   Wt 133 lb (60.3 kg)   LMP  (LMP Unknown) Comment: Pt unsure why she no longer gets her period  SpO2 98%   BMI 22.83 kg/m  Weight yesterday-not taken Last visit weight-134.3lb  On arrival, Ms. Wellbrock was not home she had run to the store to get some last minute Thanksgiving items.  Her son let me in and I was able to reconcile her pills x 1 week.  She arrived as I finished her pill box.  She reports to be doing well.  She is compliant with all her meds.  She is working diligently to make sure she's on top of all the things she needs to do.  She gave me a copy of her W-2 which I delivered to [place] for her financial assistance for her [name].  Reminded her she has an appointment coming up Tuesday 11/28.  Home visit complete.    [name], [place] 12/08/2021     Patient Care Team: Elbert Ewings, FNP as PCP - General (Nurse Practitioner) Pixie Casino, MD as PCP - Cardiology (Cardiology)  Patient Active Problem List   Diagnosis Date Noted   Coronary artery disease 11/20/2021   Elevated troponin 11/19/2021   Heart failure ([place]) 11/19/2021   Acute on chronic systolic heart failure ([place]) 38/75/6433   Chronic systolic heart failure ([facility])    Chest pain, precordial 10/09/2020   Cocaine use 10/09/2020   NSTEMI (non-ST elevated myocardial infarction) ([place]) 10/09/2020   Polysubstance abuse ([place]) 10/09/2020   Hyperlipidemia 10/09/2020   Lung nodule 10/09/2020   Tobacco dependence 10/09/2020   Screening breast examination 09/04/2018   Heroin dependence ([place]) 06/29/2018   TIA (transient ischemic attack) 06/24/2018   Nail fungus 04/20/2015   Porokeratosis 04/20/2015   History of ulceration 04/20/2015   Foot ulcer ([place]) 02/17/2014   Cellulitis of  foot, right 02/17/2014   Chronic pain syndrome 02/17/2014   Essential hypertension 02/17/2014    Current Outpatient Medications:    atorvastatin (LIPITOR) 40 MG tablet, Take 1 tablet (40 mg total) by mouth daily at 6 PM., Disp: 30 tablet, Rfl: 3   carvedilol (COREG) 3.125 MG tablet, Take 1 tablet (3.125 mg total) by mouth 2 (two) times daily with a meal., Disp: 60 tablet, Rfl: 1   clopidogrel (PLAVIX) 75 MG tablet, Take 1 tablet (75 mg total) by mouth daily., Disp: 30 tablet, Rfl: 1   empagliflozin (JARDIANCE) 10 MG TABS tablet, Take 1 tablet (10 mg total) by mouth daily., Disp: 30 tablet, Rfl: 1   ezetimibe (ZETIA) 10 MG tablet, Take 1 tablet (10 mg total) by mouth daily., Disp: 30 tablet, Rfl: 3   sacubitril-valsartan (ENTRESTO) 24-26 MG, Take 1 tablet by mouth 2 (two) times daily., Disp: 60 tablet, Rfl: 0   spironolactone (ALDACTONE) 25 MG tablet, Take 1 tablet (25 mg total) by mouth daily., Disp: 30 tablet, Rfl: 1   torsemide (DEMADEX) 20 MG tablet, Take 1 tablet (20 mg total) by mouth daily., Disp: 30 tablet, Rfl: 1 No Known Allergies    Social History   Socioeconomic History   Marital status: Single    Spouse name: Not on file   Number of children: 3   Years of education: Not on file  Highest education level: 12th grade  Occupational History   Not on file  Tobacco Use   Smoking status: Some Days    Types: Cigars   Smokeless tobacco: Never  Vaping Use   Vaping Use: Never used  Substance and Sexual Activity   Alcohol use: Yes    Comment: social drinking   Drug use: Not Currently    Types: Marijuana, Cocaine    Comment: 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   Sexual activity: Not Currently    Birth control/protection: Post-menopausal  Other Topics Concern   Not on file  Social History Narrative   Not on file   Social Determinants of Health   Financial Resource Strain: High Risk (11/13/2020)   Overall Financial Resource Strain (CARDIA)    Difficulty of Paying Living  Expenses: Hard  Food Insecurity: No Food Insecurity (11/19/2021)   Hunger Vital Sign    Worried About Running Out of Food in the Last Year: Never true    Ran Out of Food in the Last Year: Never true  Transportation Needs: No Transportation Needs (11/19/2021)   PRAPARE - Hydrologist (Medical): No    Lack of Transportation (Non-Medical): No  Physical Activity: Inactive (02/21/2017)   Exercise Vital Sign    Days of Exercise per Week: 0 days    Minutes of Exercise per Session: 0 min  Stress: No Stress Concern Present (02/21/2017)   [place]    Feeling of Stress : Not at all  Social Connections: Somewhat Isolated (02/21/2017)   Social Connection and Isolation Panel [NHANES]    Frequency of Communication with Friends and Family: More than three times a week    Frequency of Social Gatherings with Friends and Family: Once a week    Attends Religious Services: 1 to 4 times per year    Active Member of [employer] or Organizations: No    Attends Archivist Meetings: Never    Marital Status: Never married  Intimate Partner Violence: Not At Risk (11/19/2021)   Humiliation, Afraid, Rape, and Kick questionnaire    Fear of Current or Ex-Partner: No    Emotionally Abused: No    Physically Abused: No    Sexually Abused: No    Physical Exam      Future Appointments  Date Time Provider [place]  12/14/2021  9:00 AM [facility] PA/NP [place] [facility] None  12/23/2021  2:00 PM Dorna Mai, MD [facility]-P[facility] None  12/27/2021  7:05 AM [facility] DEVICE REMOTES [facility]  01/31/2022  7:05 AM [facility] DEVICE REMOTES [facility]  03/07/2022  7:05 AM [facility] DEVICE REMOTES [facility]  04/11/2022  7:05 AM [facility] DEVICE REMOTES [facility]       [name], EMT-P-Paramedic [place] Par[facility]ic  12/13/21

## 2021-12-13 ENCOUNTER — Encounter (HOSPITAL_COMMUNITY): Payer: No Typology Code available for payment source

## 2021-12-13 NOTE — Progress Notes (Incomplete)
Advanced Heart Failure Team Note      PCP: Elbert Ewings, NP Primary Cardiologist: Dr. Debara Pickett HF Cardiologist: Dr. Aundra Dubin  HPI:  56 year old with a history of HTN, hyperlipidemia, stroke cypotogenic 2020, LINQ 2020, CAD, polysubstance abuse heroin/cocaine abuse, and chronic HFrEF.    Admitted 09/2020 with NSTEMI. CT negative for PE but did show multivessel coronary calcium. Echo showed EF 45-50%, RV norma, AV moderate aortic valve stenosis. Had Cath with overlapping DES to mid RCA, 70-80% left circumflex, left main patent, mid LAD 55%. Plan for DAPT with Aspirin + Brillinta x12 months.    She was last seen by [facility] in May, 2023 .  At that time she was having a hard time paying for medications. She no showed for the July appointment.    Admitted 11/23 with a/c CHF. 3 months PTA she ran out of all medications. Says she didn't call for refills.   Last used cocaine 7 days prior and heroin about 2 weeks prior. She was diuresed with IV lasix and GDMT titrated. She was enrolled in paramedicine program. Echo with EF 20-25%, RV okay, moderate to severe AI with possible mobile mass. TEE: EF 20%, mildly reduced RV, bicuspid aortic valve with moderate to severe AI.  R/LHC: 75% m Lcx stenosis, elevated PCWP and LVEDP but normal RV pressure, moderate pulmonary venous hypertension, CI low by thermo but preserved by Fick.  cMRI: LVEF 18%, RVEF 48%, bicuspid aortic valve with severe AI, noncoronary LGE pattern, mid-wall LGE in inferolateral wall could be d/t prior myocarditis but patient has CAD in Lcx chronically.  Structural heart team reviewed her studies, not felt to be a candidate for TAVR.  Here today for hospital f/u. She is accompanied by her daughter-in-law and [name], EMT. Has been compliant with medications. Strength and activity tolerance gradually improving. Notes some dyspnea with exertion, but no orthopnea, PND or lower extremity edema.   Denies substance abuse since  discharge. Uninsured. She has no working car. Lives with her daughter. Currently not working.    ROS: All systems negative except as listed in HPI, PMH and Problem List.  SH:  Social History   Socioeconomic History   Marital status: Single    Spouse name: Not on file   Number of children: 3   Years of education: Not on file   Highest education level: 12th grade  Occupational History   Not on file  Tobacco Use   Smoking status: Some Days    Types: Cigars   Smokeless tobacco: Never  Vaping Use   Vaping Use: Never used  Substance and Sexual Activity   Alcohol use: Yes    Comment: social drinking   Drug use: Not Currently    Types: Marijuana, Cocaine    Comment: 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   Sexual activity: Not Currently    Birth control/protection: Post-menopausal  Other Topics Concern   Not on file  Social History Narrative   Not on file   Social Determinants of Health   Financial Resource Strain: High Risk (11/13/2020)   Overall Financial Resource Strain (CARDIA)    Difficulty of Paying Living Expenses: Hard  Food Insecurity: No Food Insecurity (11/19/2021)   Hunger Vital Sign    Worried About Running Out of Food in the Last Year: Never true    Ran Out of Food in the Last Year: Never true  Transportation Needs: No Transportation Needs (11/19/2021)   PRAPARE - Transportation    Lack of Transportation (  Medical): No    Lack of Transportation (Non-Medical): No  Physical Activity: Inactive (02/21/2017)   Exercise Vital Sign    Days of Exercise per Week: 0 days    Minutes of Exercise per Session: 0 min  Stress: No Stress Concern Present (02/21/2017)   [place]    Feeling of Stress : Not at all  Social Connections: Somewhat Isolated (02/21/2017)   Social Connection and Isolation Panel [NHANES]    Frequency of Communication with Friends and Family: More than three times a week    Frequency of Social  Gatherings with Friends and Family: Once a week    Attends Religious Services: 1 to 4 times per year    Active Member of [employer] or Organizations: No    Attends Archivist Meetings: Never    Marital Status: Never married  Intimate Partner Violence: Not At Risk (11/19/2021)   Humiliation, Afraid, Rape, and Kick questionnaire    Fear of Current or Ex-Partner: No    Emotionally Abused: No    Physically Abused: No    Sexually Abused: No    FH:  Family History  Problem Relation Age of Onset   Hypertension Mother    Hypertension Father    Hypertension Brother    Stroke Sister     Past Medical History:  Diagnosis Date   Chronic systolic heart failure ([place])    Cocaine abuse ([place])    Dyslipidemia    Hypertension    Noncompliance w/medication treatment due to intermit use of medication    Stroke ([place]) 2021   no deficits    Current Outpatient Medications  Medication Sig Dispense Refill   atorvastatin (LIPITOR) 40 MG tablet Take 1 tablet (40 mg total) by mouth daily at 6 PM. 30 tablet 3   carvedilol (COREG) 3.125 MG tablet Take 1 tablet (3.125 mg total) by mouth 2 (two) times daily with a meal. 60 tablet 1   clopidogrel (PLAVIX) 75 MG tablet Take 1 tablet (75 mg total) by mouth daily. 30 tablet 1   empagliflozin (JARDIANCE) 10 MG TABS tablet Take 1 tablet (10 mg total) by mouth daily. 30 tablet 1   ezetimibe (ZETIA) 10 MG tablet Take 1 tablet (10 mg total) by mouth daily. 30 tablet 3   sacubitril-valsartan (ENTRESTO) 24-26 MG Take 1 tablet by mouth 2 (two) times daily. 60 tablet 0   spironolactone (ALDACTONE) 25 MG tablet Take 1 tablet (25 mg total) by mouth daily. 30 tablet 1   torsemide (DEMADEX) 20 MG tablet Take 1 tablet (20 mg total) by mouth daily. 30 tablet 1   No current facility-administered medications for this encounter.    Vitals:   12/14/21 0919  BP: 106/72  Pulse: 81  SpO2: 97%  Weight: 61.1 kg (134 lb 9.6 oz)    PHYSICAL EXAM:  General:  Well  appearing. Ambulated into clinic HEENT: normal Neck: supple. JVP flat. Carotids 2+ bilaterally; no bruits.  Cor: PMI normal. Regular rate & rhythm. No rubs, gallops or murmurs. Lungs: clear Abdomen: soft, nontender, nondistended.  Extremities: no cyanosis, clubbing, rash, edema Neuro: alert & orientedx3, cranial nerves grossly intact. Moves all 4 extremities w/o difficulty. Affect pleasant.   ECG: SR 76 bpm, LVH   ASSESSMENT & PLAN:   1. Chronic systolic CHF/NICM: Prior echo with EF 45-50%.  Echo this admission with EF 20-25%, global hypokinesis, moderate LV dilation, normal RV, mod-severe AI.  RHC showed significantly elevated PCWP and LVEDP but  normal RA pressure.  CI 2.4 Fick/2.0 thermo.  Cause of cardiomyopathy uncertain, CAD does not appear to have progressed (75% mLCx stenosis was also noted on prior cath).  Possibly some component d/t to substance abuse/cocaine though by cardiac MRI, prior myocarditis is also a concern (non-coronary LGE noted).  LV EF 18% on cMRI with relatively preserved RV function.  Aortic insufficiency also likely plays a role.  - NYHA II/early III. Volume looks good today. Continue Torsemide 20 mg daily.  - Coreg 3.125 mg bid.  - Jardiance 10 mg daily - Entresto 24/26 mg bid - Spironolactone 25 mg daily.  - No BP room to increase Entresto further - Labs today 2. CAD: DES to RCA in 9/22.  No chest pain prior to this admission. Troponin mildly elevated with no trend, likely demand ischemia with volume overload.  Cath this admission with patent RCA stent, 50% proximal LAD, 75% mid LCx.  The LCx stenosis appeared similar to the prior cath.  Given lack of chest pain, would manage medically. Lp(a) markedly elevated with age-advanced CAD.  - Continue Plavix 75 daily.  - Continue statin - With elevated Lp(a), will need to be aggressive with lipid management and will try to get her into lipid clinic for consideration of PCSK9 inhibitor once she has insurance 3. Aortic  valve disorder: Moderate-severe AI on echo. TEE done, she has a bicuspid aortic valve with fusion of right and noncoronary cusps. There is no significant stenosis and no evidence for endocarditis, moderate-severe AI by TEE.  By cardiac MRI, aortic valve regurgitant fraction was 38% which suggests severe AI.   - Imaging reviewed with structural team. Not a candidate for TAVR. Continue to optimize HF management and repeat echo at f/u in 2 months. Depending on results, may need referral to TCTS for consideration of AVR - Recommended screening in 1st degree relatives for bicuspid aortic valve 4. Polysubstance abuse: Cocaine/heroin.  She does not inject the heroin.  - Needs to quit. Denies use since discharge from hospital 5. CVA: 2020, has LINQ monitor. Multiple tachy/AF episodes recorded. Reviewed with [employer] device rep. Episodes appear consistent with atrial tachycardia and T wave oversensing.  SDOH: Enrolled in Paramedicine program. Has been compliant with medications -Uninsured. Planning to apply for medicaid and disability.  -Longstanding history of substance abuse -Engaged HF CSW  F/u: APP 4 weeks to assess volume/med titration, Dr. Aundra Dubin 2 months with echo

## 2021-12-14 ENCOUNTER — Ambulatory Visit (HOSPITAL_COMMUNITY)
Admission: RE | Admit: 2021-12-14 | Discharge: 2021-12-14 | Disposition: A | Discharge status: Home / Self Care | Payer: No Typology Code available for payment source | Source: Ambulatory Visit | Attending: Physician Assistant | Admitting: Physician Assistant

## 2021-12-14 ENCOUNTER — Telehealth (HOSPITAL_COMMUNITY): Payer: Self-pay | Admitting: Licensed Clinical Social Worker

## 2021-12-14 ENCOUNTER — Encounter (HOSPITAL_COMMUNITY): Payer: Self-pay

## 2021-12-14 ENCOUNTER — Other Ambulatory Visit (HOSPITAL_COMMUNITY): Payer: Self-pay | Admitting: Emergency Medicine

## 2021-12-14 ENCOUNTER — Other Ambulatory Visit: Payer: Self-pay

## 2021-12-14 VITALS — BP 106/72 | HR 81 | Wt 134.6 lb

## 2021-12-14 DIAGNOSIS — E785 Hyperlipidemia, unspecified: Secondary | ICD-10-CM | POA: Insufficient documentation

## 2021-12-14 DIAGNOSIS — I351 Nonrheumatic aortic (valve) insufficiency: Secondary | ICD-10-CM

## 2021-12-14 DIAGNOSIS — I428 Other cardiomyopathies: Secondary | ICD-10-CM | POA: Insufficient documentation

## 2021-12-14 DIAGNOSIS — I110 Hypertensive heart disease with heart failure: Secondary | ICD-10-CM | POA: Insufficient documentation

## 2021-12-14 DIAGNOSIS — F1910 Other psychoactive substance abuse, uncomplicated: Secondary | ICD-10-CM | POA: Insufficient documentation

## 2021-12-14 DIAGNOSIS — I5022 Chronic systolic (congestive) heart failure: Secondary | ICD-10-CM | POA: Insufficient documentation

## 2021-12-14 DIAGNOSIS — Z7902 Long term (current) use of antithrombotics/antiplatelets: Secondary | ICD-10-CM | POA: Insufficient documentation

## 2021-12-14 DIAGNOSIS — I2510 Atherosclerotic heart disease of native coronary artery without angina pectoris: Secondary | ICD-10-CM | POA: Insufficient documentation

## 2021-12-14 DIAGNOSIS — I639 Cerebral infarction, unspecified: Secondary | ICD-10-CM | POA: Insufficient documentation

## 2021-12-14 DIAGNOSIS — I359 Nonrheumatic aortic valve disorder, unspecified: Secondary | ICD-10-CM | POA: Insufficient documentation

## 2021-12-14 LAB — BASIC METABOLIC PANEL
Anion gap: 13 (ref 5–15)
BUN: 40 mg/dL — ABNORMAL HIGH (ref 6–20)
CO2: 29 mmol/L (ref 22–32)
Calcium: 9.6 mg/dL (ref 8.9–10.3)
Chloride: 98 mmol/L (ref 98–111)
Creatinine, Ser: 2.45 mg/dL — ABNORMAL HIGH (ref 0.44–1.00)
GFR, Estimated: 22 mL/min — ABNORMAL LOW (ref >60)
Glucose, Bld: 122 mg/dL — ABNORMAL HIGH (ref 70–99)
Potassium: 4.8 mmol/L (ref 3.5–5.1)
Sodium: 140 mmol/L (ref 135–145)

## 2021-12-14 LAB — BRAIN NATRIURETIC PEPTIDE: B Natriuretic Peptide: 68.1 pg/mL (ref 0.0–100.0)

## 2021-12-14 MED ORDER — CLOPIDOGREL BISULFATE 75 MG PO TABS
75.0000 mg | ORAL_TABLET | Freq: Every day | ORAL | 1 refills | Status: DC
  Filled 2021-12-14 – 2021-12-21 (×3): qty 30, 30d supply, fill #0
  Filled 2022-01-18: qty 30, 30d supply, fill #1

## 2021-12-14 MED ORDER — EMPAGLIFLOZIN 10 MG PO TABS
10.0000 mg | ORAL_TABLET | Freq: Every day | ORAL | 1 refills | Status: DC
  Filled 2021-12-14 – 2022-08-24 (×3): qty 30, 30d supply, fill #0

## 2021-12-14 MED ORDER — ATORVASTATIN CALCIUM 40 MG PO TABS
40.0000 mg | ORAL_TABLET | Freq: Every day | ORAL | 3 refills | Status: DC
  Filled 2021-12-14 – 2021-12-21 (×3): qty 30, 30d supply, fill #0
  Filled 2022-01-18: qty 30, 30d supply, fill #1
  Filled 2022-02-18: qty 30, 30d supply, fill #2
  Filled 2022-03-23: qty 30, 30d supply, fill #3

## 2021-12-14 MED ORDER — TORSEMIDE 20 MG PO TABS
20.0000 mg | ORAL_TABLET | Freq: Every day | ORAL | 1 refills | Status: DC
  Filled 2021-12-14: qty 30, 30d supply, fill #0

## 2021-12-14 MED ORDER — CARVEDILOL 3.125 MG PO TABS
3.1250 mg | ORAL_TABLET | Freq: Two times a day (BID) | ORAL | 1 refills | Status: DC
  Filled 2021-12-14 – 2021-12-21 (×3): qty 60, 30d supply, fill #0
  Filled 2022-01-18: qty 60, 30d supply, fill #1

## 2021-12-14 MED ORDER — EZETIMIBE 10 MG PO TABS
10.0000 mg | ORAL_TABLET | Freq: Every day | ORAL | 3 refills | Status: DC
  Filled 2021-12-14 – 2021-12-21 (×3): qty 30, 30d supply, fill #0
  Filled 2022-01-18: qty 30, 30d supply, fill #1
  Filled 2022-02-18: qty 30, 30d supply, fill #2
  Filled 2022-03-23: qty 30, 30d supply, fill #3

## 2021-12-14 MED ORDER — SPIRONOLACTONE 25 MG PO TABS
25.0000 mg | ORAL_TABLET | Freq: Every day | ORAL | 1 refills | Status: DC
  Filled 2021-12-14 – 2021-12-21 (×3): qty 30, 30d supply, fill #0
  Filled 2022-01-18: qty 30, 30d supply, fill #1

## 2021-12-14 NOTE — Progress Notes (Signed)
Paramedicine Encounter   Patient ID: Leslie Walker , female,   DOB: [birth year],56 y.o.,  MRN: [id number]   Met patient in clinic today with provider.  Time spent with patient 1.5hrs Met Leslie Walker for clinic visit today with Marlyce Huge.  Leslie Walker reports to feeling well today, denies chest pain or SOB.  She is doing great with her med compliance.  Med box reconciled x 1 week during visit.  Extended discussion with Marlyce Huge and Leslie Walker regarding her heart valve abnormality.   Raquel Sarna met with Leslie Walker to discuss the process of applying for Medicaid and SSID.  Her daughter-in-law advised she would help her navigate this process.   Pt chooses to use [facility] and would like to get a PCP established with [facility] office on [address].     [name], [place] 12/14/2021

## 2021-12-14 NOTE — Telephone Encounter (Signed)
CSW received return message from [facility] and stated that although patient's EF meets criteria for disability the echo was completed during an acute episode and there may be some improvement with medication compliance and lifestyle. Patient encouraged to apply for Medicaid as it is possible with new guidelines she may qualify for Medicaid. CSW contacted patient to follow up with the above information. Message left. Raquel Sarna, [place], [place]

## 2021-12-14 NOTE — Progress Notes (Signed)
Medication Samples have been provided to the patient.  Drug name: [name]       Strength: 24/26        Qty: 28tab  LOT: [id number]  Exp.Date: 8/25  Dosing instructions: take one tablet, two times daily.   The patient has been instructed regarding the correct time, dose, and frequency of taking this medication, including desired effects and most common side effects.   Pricilla Holm P 10:37 AM 12/14/2021

## 2021-12-14 NOTE — Patient Instructions (Signed)
Medication Changes:  Continue current regimen. All medication refills sent to pharmacy.  You were given a sample of Entresto. Continue to take 1 tablet, twice daily.   Lab Work:  Labs done today, your results will be available in MyChart, we will contact you for abnormal readings.  Testing/Procedures:  Your physician has requested that you have an echocardiogram. Echocardiography is a painless test that uses sound waves to create images of your heart. It provides your doctor with information about the size and shape of your heart and how well your heart's chambers and valves are working. This procedure takes approximately one hour. There are no restrictions for this procedure. Please do NOT wear cologne, perfume, aftershave, or lotions (deodorant is allowed). Please arrive 15 minutes prior to your appointment time.   Special Instructions // Education:  Do the following things EVERYDAY: Weigh yourself in the morning before breakfast. Write it down and keep it in a log. Take your medicines as prescribed Eat low salt foods--Limit salt (sodium) to 2000 mg per day.  Stay as active as you can everyday Limit all fluids for the day to less than 2 liters  Follow-Up in: 4 weeks with APP.  2 months with Dr. Aundra Dubin and an ECHO.   At the [facility], you and your health needs are our priority. We have a designated team specialized in the treatment of Heart Failure. This Care Team includes your primary Heart Failure Specialized Cardiologist (physician), Advanced Practice Providers (APPs- Physician Assistants and Nurse Practitioners), and Pharmacist who all work together to provide you with the care you need, when you need it.   You may see any of the following providers on your designated Care Team at your next follow up:  Dr. Glori Bickers Dr. Loralie Champagne Dr. Roxana Hires, NP Lyda Jester, [place] [facility] [place], [place] Forestine Na,  NP Audry Riles, PharmD   Please be sure to bring in all your medications bottles to every appointment.   Need to Contact us:  If you have any questions or concerns before your next appointment please send [place] a message through [place] or call our office at [phone].    TO LEAVE A MESSAGE FOR THE NURSE SELECT OPTION 2, PLEASE LEAVE A MESSAGE INCLUDING: YOUR NAME DATE OF BIRTH CALL BACK NUMBER REASON FOR CALL**this is important as we prioritize the call backs  YOU WILL RECEIVE A CALL BACK THE SAME DAY AS LONG AS YOU CALL BEFORE 4:00 PM

## 2021-12-14 NOTE — Progress Notes (Signed)
H&V Care Navigation CSW Progress Note  Clinical Social Worker met with patient to assist with insurance.  Patient is participating in a Managed Medicaid Plan:  uninsured  Patient is a 56 yo female who lives with her daughter. She was recently working but has been unable due to current health. Patient is present with her daughter in law and states she has no insurance and the bills are stacking up. CSW reviewed Social Security Disability and Medicaid and the process for application. Patient currently has an EF of 20-25% which may be eligible for disability/medicaid. CSW will reach out to [employer] to inquire about a screening for eligibility. Patient verbalizes understanding of follow up for applications. CSW available and will coordinate with Tammy Sours, LCSW regarding visit today. Raquel Sarna, [place], [place]   SDOH Screenings   Food Insecurity: No Food Insecurity (12/14/2021)  Housing: Low Risk  (11/19/2021)  Transportation Needs: No Transportation Needs (12/14/2021)  Utilities: Not At Risk (11/19/2021)  Financial Resource Strain: High Risk (12/14/2021)  Physical Activity: Inactive (02/21/2017)  Social Connections: Somewhat Isolated (02/21/2017)  Stress: No Stress Concern Present (02/21/2017)  Tobacco Use: High Risk (12/14/2021)

## 2021-12-15 ENCOUNTER — Other Ambulatory Visit (HOSPITAL_COMMUNITY): Payer: Self-pay | Admitting: Emergency Medicine

## 2021-12-15 ENCOUNTER — Other Ambulatory Visit: Payer: Self-pay

## 2021-12-15 ENCOUNTER — Telehealth (HOSPITAL_COMMUNITY): Payer: Self-pay | Admitting: Emergency Medicine

## 2021-12-15 ENCOUNTER — Telehealth (HOSPITAL_COMMUNITY): Payer: Self-pay | Admitting: *Deleted

## 2021-12-15 ENCOUNTER — Other Ambulatory Visit (HOSPITAL_COMMUNITY): Payer: Self-pay | Admitting: *Deleted

## 2021-12-15 DIAGNOSIS — I5022 Chronic systolic (congestive) heart failure: Secondary | ICD-10-CM

## 2021-12-15 DIAGNOSIS — Z79899 Other long term (current) drug therapy: Secondary | ICD-10-CM

## 2021-12-15 MED ORDER — TORSEMIDE 20 MG PO TABS: 20.0000 mg | ORAL_TABLET | ORAL | 1 refills | Status: DC | PRN

## 2021-12-15 NOTE — Progress Notes (Signed)
Visited w/ Ms. Larmore to review med change in her pill box Stop Torsemide and take only as needed. Stop Jardiance x 2 days then restart.  Med box set up to reflect changes.  Also advised her to go ahead and apply for Medicaid ASAP per Raquel Sarna.   [name], [place] 12/15/2021

## 2021-12-15 NOTE — Telephone Encounter (Signed)
Called patient (and her daughter) per Jeneen Rinks, [place]. Based on recent lab results from clinic visit, instructed patient of the following:  Kidney function worse, possibly dehydrated. Instructed patient to stop Torsemide 20 mg daily and take only as needed (for weight gain or swelling of lower legs/feet). Hold Jardiance for two days, then re-start daily. Repeat lab scheduled for Friday, 12/24/21 at 3:15.  Pt wrote down above instructions and verbalized understanding of same. Asked her to call HF Clinic if any questions, contact info provided.

## 2021-12-15 NOTE — Telephone Encounter (Signed)
Contacted [facility] to advise them Leslie Walker desires to have all her meds filled at their pharmacy and would like them to be delivered in the future.  Meds being moved from [place] on [address].

## 2021-12-15 NOTE — Addendum Note (Signed)
Addended by: Zada Girt B on: 12/15/2021 11:02 AM   Modules accepted: Orders

## 2021-12-17 ENCOUNTER — Other Ambulatory Visit (HOSPITAL_COMMUNITY): Payer: Self-pay | Admitting: Emergency Medicine

## 2021-12-18 NOTE — Progress Notes (Signed)
Pt had called earlier this morning and requested a medicaid application.  I delivered two blank applications to her home at her request.  No one was home when I arrived but I was able to place the applications on her bed in her bedroom and then called her to let her know that I had left same for her and she was good with this.    [name], [place] 12/18/2021

## 2021-12-21 ENCOUNTER — Encounter (HOSPITAL_COMMUNITY): Payer: Self-pay

## 2021-12-21 ENCOUNTER — Other Ambulatory Visit (HOSPITAL_COMMUNITY): Payer: Self-pay

## 2021-12-21 ENCOUNTER — Other Ambulatory Visit: Payer: Self-pay

## 2021-12-21 ENCOUNTER — Other Ambulatory Visit (HOSPITAL_COMMUNITY): Payer: Self-pay | Admitting: Emergency Medicine

## 2021-12-21 LAB — ECHO TEE
AR max vel: 2.23 cm2
AV Area VTI: 2.33 cm2
AV Area mean vel: 2.34 cm2
AV Mean grad: 5 mmHg
AV Peak grad: 9.9 mmHg
Ao pk vel: 1.57 m/s
P 1/2 time: 234 msec

## 2021-12-21 NOTE — Progress Notes (Unsigned)
Medication Samples have been provided to the patient.  Drug name: [name]       Strength: 24/26 mg        Qty: 2  LOT: [id number]  Exp.Date: 08/2023  Dosing instructions: Take 1 tablet Twice daily   The patient has been instructed regarding the correct time, dose, and frequency of taking this medication, including desired effects and most common side effects.   Juanita Laster Breiona Couvillon 4:16 PM 12/21/2021

## 2021-12-21 NOTE — Telephone Encounter (Signed)
Advanced Heart Failure Patient Surveyor, minerals for update. They are requesting POI for the 2nd person listed in the household. Left message for patient to call back.

## 2021-12-21 NOTE — Progress Notes (Signed)
Paramedicine Encounter    Patient ID: Leslie Walker, female    DOB: 02-22-1965, 56 y.o.   MRN: [id number]   BP 110/78 (BP Location: Left Arm, Patient Position: Standing, Cuff Size: Normal)   Pulse 80   Resp 16   Wt 131 lb 6.4 oz (59.6 kg)   LMP  (LMP Unknown) Comment: Pt unsure why she no longer gets her period  BMI 22.55 kg/m  Weight yesterday-133.4lb Last visit weight-133lb   ATF Leslie Walker A&O x 4, skin W&D w/ good color.  She reports to be feeling good and denies chest pain or SOB.  Lung sounds clear and equal bilat.  No edema noted.  Pt. Is very conscientious about taking her medications and keeps good records of her weights daily.  She has not yet filled out the application for Medicaid that I dropped off for her last week and I encouraged her to get this done ASAP.  Med box reconciled for next 3 days.  Her prescriptions were [facility] from [facility] to [facility] and I went by to pick them up and they had put them back.  In the end, we ended up transferring them over to [facility] so that the meds could be covered under the heart failure fund.  She has been approved for Jardiance and needs to call [employer] to get it mailed out to her.  In the meantime, I got samples from the HF Clinic.  [employer] is still pending approval for her [name] - still needing POI.  I was also able to get samples of the [facility] as well.  I will pick up her meds from [facility] in the morning Leslie Walker, Zetia, Clopidogrel, Carvedilol, Atorvastatin) and complete her med reconciliation.   Home visit complete.    Leslie Walker, [place] 12/21/2021   Patient Care Team: Elbert Ewings, FNP as PCP - General (Nurse Practitioner) Pixie Casino, MD as PCP - Cardiology (Cardiology)  Patient Active Problem List   Diagnosis Date Noted   Coronary artery disease 11/20/2021   Elevated troponin 11/19/2021   Heart failure ([place]) 11/19/2021   Acute on chronic systolic  heart failure ([place]) 47/09/6281   Chronic systolic heart failure ([facility])    Chest pain, precordial 10/09/2020   Cocaine use 10/09/2020   NSTEMI (non-ST elevated myocardial infarction) ([place]) 10/09/2020   Polysubstance abuse ([place]) 10/09/2020   Hyperlipidemia 10/09/2020   Lung nodule 10/09/2020   Tobacco dependence 10/09/2020   Screening breast examination 09/04/2018   Heroin dependence ([place]) 06/29/2018   TIA (transient ischemic attack) 06/24/2018   Nail fungus 04/20/2015   Porokeratosis 04/20/2015   History of ulceration 04/20/2015   Foot ulcer ([place]) 02/17/2014   Cellulitis of foot, right 02/17/2014   Chronic pain syndrome 02/17/2014   Essential hypertension 02/17/2014    Current Outpatient Medications:    atorvastatin (LIPITOR) 40 MG tablet, Take 1 tablet (40 mg total) by mouth daily at 6 PM., Disp: 30 tablet, Rfl: 3   carvedilol (COREG) 3.125 MG tablet, Take 1 tablet (3.125 mg total) by mouth 2 (two) times daily with a meal., Disp: 60 tablet, Rfl: 1   clopidogrel (PLAVIX) 75 MG tablet, Take 1 tablet (75 mg total) by mouth daily., Disp: 30 tablet, Rfl: 1   empagliflozin (JARDIANCE) 10 MG TABS tablet, Take 1 tablet (10 mg total) by mouth daily., Disp: 30 tablet, Rfl: 1   ezetimibe (ZETIA) 10 MG tablet, Take 1 tablet (10 mg total) by mouth daily., Disp:  30 tablet, Rfl: 3   sacubitril-valsartan (ENTRESTO) 24-26 MG, Take 1 tablet by mouth 2 (two) times daily., Disp: 60 tablet, Rfl: 0   spironolactone (ALDACTONE) 25 MG tablet, Take 1 tablet (25 mg total) by mouth daily., Disp: 30 tablet, Rfl: 1   torsemide (DEMADEX) 20 MG tablet, Take 1 tablet (20 mg total) by mouth as needed., Disp: 30 tablet, Rfl: 1 No Known Allergies    Social History   Socioeconomic History   Marital status: Single    Spouse name: Not on file   Number of children: 3   Years of education: Not on file   Highest education level: 12th grade  Occupational History   Not on file  Tobacco Use   Smoking status: Some  Days    Types: Cigars   Smokeless tobacco: Never  Vaping Use   Vaping Use: Never used  Substance and Sexual Activity   Alcohol use: Yes    Comment: social drinking   Drug use: Not Currently    Types: Marijuana, Cocaine    Comment: 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   Sexual activity: Not Currently    Birth control/protection: Post-menopausal  Other Topics Concern   Not on file  Social History Narrative   Not on file   Social Determinants of Health   Financial Resource Strain: High Risk (12/14/2021)   Overall Financial Resource Strain (CARDIA)    Difficulty of Paying Living Expenses: Hard  Food Insecurity: No Food Insecurity (12/14/2021)   Hunger Vital Sign    Worried About Running Out of Food in the Last Year: Never true    Ran Out of Food in the Last Year: Never true  Transportation Needs: No Transportation Needs (12/14/2021)   PRAPARE - Hydrologist (Medical): No    Lack of Transportation (Non-Medical): No  Physical Activity: Inactive (02/21/2017)   Exercise Vital Sign    Days of Exercise per Week: 0 days    Minutes of Exercise per Session: 0 min  Stress: No Stress Concern Present (02/21/2017)   [place]    Feeling of Stress : Not at all  Social Connections: Somewhat Isolated (02/21/2017)   Social Connection and Isolation Panel [NHANES]    Frequency of Communication with Friends and Family: More than three times a week    Frequency of Social Gatherings with Friends and Family: Once a week    Attends Religious Services: 1 to 4 times per year    Active Member of [employer] or Organizations: No    Attends Archivist Meetings: Never    Marital Status: Never married  Intimate Partner Violence: Not At Risk (11/19/2021)   Humiliation, Afraid, Rape, and Kick questionnaire    Fear of Current or Ex-Partner: No    Emotionally Abused: No    Physically Abused: No    Sexually Abused: No     Physical Exam      Future Appointments  Date Time Provider [place]  12/23/2021  2:00 PM Dorna Mai, MD [facility] None  12/24/2021  3:15 PM [place] [facility] [place] None  12/27/2021  7:05 AM [facility] DEVICE REMOTES [facility]  01/11/2022  9:00 AM [facility] PA/NP [facility] None  01/31/2022  7:05 AM [facility] DEVICE REMOTES [facility]  03/07/2022  7:05 AM [facility] DEVICE REMOTES [facility]  04/11/2022  7:05 AM [facility] DEVICE REMOTES [facility]       Leslie Walker, EMT-P-Paramedic [place] Par[facility]ic  12/21/21  

## 2021-12-21 NOTE — Progress Notes (Unsigned)
Medication Samples have been provided to the patient.  Drug name: Jardiance       Strength: 10 mg        Qty: 4  LOT: [id number]  Exp.Date: 11/2023  Dosing instructions: Take 1 tablet  daily  The patient has been instructed regarding the correct time, dose, and frequency of taking this medication, including desired effects and most common side effects.   Leslie Walker Leslie Walker

## 2021-12-22 ENCOUNTER — Other Ambulatory Visit (HOSPITAL_COMMUNITY): Payer: Self-pay

## 2021-12-22 ENCOUNTER — Other Ambulatory Visit (HOSPITAL_COMMUNITY): Payer: Self-pay | Admitting: Emergency Medicine

## 2021-12-22 NOTE — Progress Notes (Signed)
Med reconciliation only.  Reviewed with her how to call and request refills from [facility].  She should be due to call for her next refills on Feb 2nd.    [name], [place] 12/22/2021

## 2021-12-22 NOTE — Telephone Encounter (Signed)
Advanced Heart Failure Patient Advocate Encounter  Spoke to [employer], patient will need to call and confirm information regarding the second person listed in the household. Provided patient with number for [employer].

## 2021-12-23 ENCOUNTER — Encounter: Payer: Self-pay | Admitting: Family Medicine

## 2021-12-23 ENCOUNTER — Ambulatory Visit (INDEPENDENT_AMBULATORY_CARE_PROVIDER_SITE_OTHER): Payer: No Typology Code available for payment source | Admitting: Family Medicine

## 2021-12-23 VITALS — BP 102/64 | HR 94 | Temp 98.1°F | Resp 16 | Ht 66.0 in | Wt 138.6 lb

## 2021-12-23 DIAGNOSIS — Z7689 Persons encountering health services in other specified circumstances: Secondary | ICD-10-CM

## 2021-12-23 DIAGNOSIS — Z09 Encounter for follow-up examination after completed treatment for conditions other than malignant neoplasm: Secondary | ICD-10-CM

## 2021-12-23 DIAGNOSIS — I10 Essential (primary) hypertension: Secondary | ICD-10-CM

## 2021-12-23 DIAGNOSIS — I5022 Chronic systolic (congestive) heart failure: Secondary | ICD-10-CM

## 2021-12-24 ENCOUNTER — Other Ambulatory Visit (HOSPITAL_COMMUNITY): Payer: No Typology Code available for payment source

## 2021-12-24 NOTE — Progress Notes (Signed)
Carelink Summary Report / Loop Recorder 

## 2021-12-27 ENCOUNTER — Ambulatory Visit (INDEPENDENT_AMBULATORY_CARE_PROVIDER_SITE_OTHER): Payer: No Typology Code available for payment source

## 2021-12-27 ENCOUNTER — Other Ambulatory Visit (HOSPITAL_COMMUNITY): Payer: No Typology Code available for payment source

## 2021-12-27 ENCOUNTER — Encounter: Payer: Self-pay | Admitting: Family Medicine

## 2021-12-27 DIAGNOSIS — I639 Cerebral infarction, unspecified: Secondary | ICD-10-CM

## 2021-12-27 LAB — CUP PACEART REMOTE DEVICE CHECK
Date Time Interrogation Session: 20231210232227
Implantable Pulse Generator Implant Date: 20200610

## 2021-12-27 NOTE — Progress Notes (Signed)
New Patient Office Visit  Subjective    Patient ID: Leslie Walker, female    DOB: [birth year]  Age: 56 y.o. MRN: [id number]  CC:  Chief Complaint  Patient presents with   Establish Care    HPI Leslie Walker presents to establish care and for review of chronic med issues after hospitalization. Patient denies acute complaints or concerns.    Outpatient Encounter Medications as of 12/23/2021  Medication Sig   atorvastatin (LIPITOR) 40 MG tablet Take 1 tablet (40 mg total) by mouth daily at 6 PM.   carvedilol (COREG) 3.125 MG tablet Take 1 tablet (3.125 mg total) by mouth 2 (two) times daily with a meal.   clopidogrel (PLAVIX) 75 MG tablet Take 1 tablet (75 mg total) by mouth daily.   empagliflozin (JARDIANCE) 10 MG TABS tablet Take 1 tablet (10 mg total) by mouth daily.   ezetimibe (ZETIA) 10 MG tablet Take 1 tablet (10 mg total) by mouth daily.   sacubitril-valsartan (ENTRESTO) 24-26 MG Take 1 tablet by mouth 2 (two) times daily.   spironolactone (ALDACTONE) 25 MG tablet Take 1 tablet (25 mg total) by mouth daily.   torsemide (DEMADEX) 20 MG tablet Take 1 tablet (20 mg total) by mouth as needed.   No facility-administered encounter medications on file as of 12/23/2021.    Past Medical History:  Diagnosis Date   Chronic systolic heart failure (HCC)    Cocaine abuse ([place])    Dyslipidemia    Hypertension    Noncompliance w/medication treatment due to intermit use of medication    Stroke ([place]) 2021   no deficits    Past Surgical History:  Procedure Laterality Date   CORONARY STENT INTERVENTION N/A 10/09/2020   Procedure: CORONARY STENT INTERVENTION;  Surgeon: Belva Crome, MD;  Location: [facility];  Service: Cardiovascular;  Laterality: N/A;   INTRAVASCULAR PRESSURE WIRE/FFR STUDY N/A 10/09/2020   Procedure: INTRAVASCULAR PRESSURE WIRE/FFR STUDY;  Surgeon: Belva Crome, MD;  Location: [facility];  Service: Cardiovascular;  Laterality: N/A;   LEFT HEART  CATH AND CORONARY ANGIOGRAPHY N/A 10/09/2020   Procedure: LEFT HEART CATH AND CORONARY ANGIOGRAPHY;  Surgeon: Belva Crome, MD;  Location: [facility];  Service: Cardiovascular;  Laterality: N/A;   LOOP RECORDER INSERTION N/A 06/27/2018   Procedure: LOOP RECORDER INSERTION;  Surgeon: Constance Haw, MD;  Location: [facility];  Service: Cardiovascular;  Laterality: N/A;   RIGHT/LEFT HEART CATH AND CORONARY ANGIOGRAPHY N/A 11/22/2021   Procedure: RIGHT/LEFT HEART CATH AND CORONARY ANGIOGRAPHY;  Surgeon: Larey Dresser, MD;  Location: [facility];  Service: Cardiovascular;  Laterality: N/A;   TEE WITHOUT CARDIOVERSION N/A 11/23/2021   Procedure: TRANSESOPHAGEAL ECHOCARDIOGRAM (TEE);  Surgeon: Larey Dresser, MD;  Location[facility];  Service: Cardiovascular;  Laterality: N/A;    Family History  Problem Relation Age of Onset   Hypertension Mother    Hypertension Father    Hypertension Brother    Stroke Sister     Social History   Socioeconomic History   Marital status: Single    Spouse name: Not on file   Number of children: 3   Years of education: Not on file   Highest education level: 12th grade  Occupational History   Not on file  Tobacco Use   Smoking status: Some Days    Types: Cigars   Smokeless tobacco: Never  Vaping Use   Vaping Use: Never used  Substance and Sexual  Activity   Alcohol use: Yes    Comment: social drinking   Drug use: Not Currently    Types: Marijuana, Cocaine    Comment: 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   Sexual activity: Not Currently    Birth control/protection: Post-menopausal  Other Topics Concern   Not on file  Social History Narrative   Not on file   Social Determinants of Health   Financial Resource Strain: High Risk (12/14/2021)   Overall Financial Resource Strain (CARDIA)    Difficulty of Paying Living Expenses: Hard  Food Insecurity: No Food Insecurity (12/14/2021)   Hunger Vital Sign    Worried  About Running Out of Food in the Last Year: Never true    Ran Out of Food in the Last Year: Never true  Transportation Needs: No Transportation Needs (12/14/2021)   PRAPARE - Hydrologist (Medical): No    Lack of Transportation (Non-Medical): No  Physical Activity: Inactive (02/21/2017)   Exercise Vital Sign    Days of Exercise per Week: 0 days    Minutes of Exercise per Session: 0 min  Stress: No Stress Concern Present (02/21/2017)   [place]    Feeling of Stress : Not at all  Social Connections: Somewhat Isolated (02/21/2017)   Social Connection and Isolation Panel [NHANES]    Frequency of Communication with Friends and Family: More than three times a week    Frequency of Social Gatherings with Friends and Family: Once a week    Attends Religious Services: 1 to 4 times per year    Active Member of [employer] or Organizations: No    Attends Archivist Meetings: Never    Marital Status: Never married  Intimate Partner Violence: Not At Risk (11/19/2021)   Humiliation, Afraid, Rape, and Kick questionnaire    Fear of Current or Ex-Partner: No    Emotionally Abused: No    Physically Abused: No    Sexually Abused: No    Review of Systems  All other systems reviewed and are negative.       Objective    BP 102/64   Pulse 94   Temp 98.1 F (36.7 C) (Oral)   Resp 16   Ht '5\' 6"'$  (1.676 m)   Wt 138 lb 9.6 oz (62.9 kg)   LMP  (LMP Unknown) Comment: Pt unsure why she no longer gets her period  SpO2 98%   BMI 22.37 kg/m   Physical Exam Vitals and nursing note reviewed.  Constitutional:      General: She is not in acute distress. Cardiovascular:     Rate and Rhythm: Normal rate and regular rhythm.  Pulmonary:     Effort: Pulmonary effort is normal.     Breath sounds: Normal breath sounds.  Abdominal:     Palpations: Abdomen is soft.     Tenderness: There is no abdominal  tenderness.  Musculoskeletal:     Right lower leg: No edema.     Left lower leg: No edema.  Neurological:     General: No focal deficit present.     Mental Status: She is alert and oriented to person, place, and time.  Psychiatric:        Mood and Affect: Mood normal.        Behavior: Behavior normal.         Assessment & Plan:  1. Hospital discharge follow-up Patient improving since discharge  2. Chronic systolic  heart failure ([place]) Management per consultant  3. Essential hypertension Appears stable. continue  4. Encounter to establish care Patient prefers to continue care at [facility] 2/2 transportation issues.   No follow-ups on file.   Becky Sax, MD

## 2021-12-28 NOTE — Telephone Encounter (Signed)
Advanced Heart Failure Patient Advocate [employer] for update, patient did call on 12/6 and was informed that secondary POI is required for determination.

## 2021-12-29 ENCOUNTER — Other Ambulatory Visit (HOSPITAL_COMMUNITY): Payer: Self-pay | Admitting: Emergency Medicine

## 2021-12-29 NOTE — Telephone Encounter (Signed)
Advanced Heart Failure Patient Advocate Encounter  POI for second person in household faxed to [employer] on 12/29/21

## 2021-12-29 NOTE — Progress Notes (Signed)
Paramedicine Encounter    Patient ID: Leslie Walker, female    DOB: 1965/05/21, 56 y.o.   MRN: [id number]   LMP  (LMP Unknown) Comment: Pt unsure why she no longer gets her period Weight yesterday-not taken Last visit weight-131lb  ATF Ms. Nofziger A&O x 4, skin W&D w/ good color.  Pt advises that she had some stomach upset during the night with some diarrhea w/ abdominal cramping w/ some nausea.  She states she hasn't had much of an appetite yet this morning. She denies chest pain or SOB.  Lung sounds clear and equal bilat.  No edema noted.  Blood pressure running low and pulse elevated.  She denies dizziness.  Contacted HF triage who advised for her to skip her evening dose of Entresto tonight and I will drop by tomorrow to re-check her blood pressure. Pt. Has been compliant with all medications.  Pill box reconciled for 1 week. Home visit complete.    [name], [place] 12/29/2021  Patient Care Team: Elbert Ewings, FNP as PCP - General (Nurse Practitioner) Pixie Casino, MD as PCP - Cardiology (Cardiology)  Patient Active Problem List   Diagnosis Date Noted   Coronary artery disease 11/20/2021   Elevated troponin 11/19/2021   Heart failure ([place]) 11/19/2021   Acute on chronic systolic heart failure ([place]) 63/01/6008   Chronic systolic heart failure (HCC)    Chest pain, precordial 10/09/2020   Cocaine use 10/09/2020   NSTEMI (non-ST elevated myocardial infarction) ([place]) 10/09/2020   Polysubstance abuse ([place]) 10/09/2020   Hyperlipidemia 10/09/2020   Lung nodule 10/09/2020   Tobacco dependence 10/09/2020   Screening breast examination 09/04/2018   Heroin dependence ([place]) 06/29/2018   TIA (transient ischemic attack) 06/24/2018   Nail fungus 04/20/2015   Porokeratosis 04/20/2015   History of ulceration 04/20/2015   Foot ulcer ([place]) 02/17/2014   Cellulitis of foot, right 02/17/2014   Chronic pain syndrome 02/17/2014   Essential hypertension 02/17/2014     Current Outpatient Medications:    atorvastatin (LIPITOR) 40 MG tablet, Take 1 tablet (40 mg total) by mouth daily at 6 PM., Disp: 30 tablet, Rfl: 3   carvedilol (COREG) 3.125 MG tablet, Take 1 tablet (3.125 mg total) by mouth 2 (two) times daily with a meal., Disp: 60 tablet, Rfl: 1   clopidogrel (PLAVIX) 75 MG tablet, Take 1 tablet (75 mg total) by mouth daily., Disp: 30 tablet, Rfl: 1   empagliflozin (JARDIANCE) 10 MG TABS tablet, Take 1 tablet (10 mg total) by mouth daily., Disp: 30 tablet, Rfl: 1   ezetimibe (ZETIA) 10 MG tablet, Take 1 tablet (10 mg total) by mouth daily., Disp: 30 tablet, Rfl: 3   sacubitril-valsartan (ENTRESTO) 24-26 MG, Take 1 tablet by mouth 2 (two) times daily., Disp: 60 tablet, Rfl: 0   spironolactone (ALDACTONE) 25 MG tablet, Take 1 tablet (25 mg total) by mouth daily., Disp: 30 tablet, Rfl: 1   torsemide (DEMADEX) 20 MG tablet, Take 1 tablet (20 mg total) by mouth as needed., Disp: 30 tablet, Rfl: 1 No Known Allergies    Social History   Socioeconomic History   Marital status: Single    Spouse name: Not on file   Number of children: 3   Years of education: Not on file   Highest education level: 12th grade  Occupational History   Not on file  Tobacco Use   Smoking status: Some Days    Types: Cigars   Smokeless tobacco: Never  Vaping Use   Vaping  Use: Never used  Substance and Sexual Activity   Alcohol use: Yes    Comment: social drinking   Drug use: Not Currently    Types: Marijuana, Cocaine    Comment: 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   Sexual activity: Not Currently    Birth control/protection: Post-menopausal  Other Topics Concern   Not on file  Social History Narrative   Not on file   Social Determinants of Health   Financial Resource Strain: High Risk (12/14/2021)   Overall Financial Resource Strain (CARDIA)    Difficulty of Paying Living Expenses: Hard  Food Insecurity: No Food Insecurity (12/14/2021)   Hunger Vital Sign     Worried About Running Out of Food in the Last Year: Never true    Ran Out of Food in the Last Year: Never true  Transportation Needs: No Transportation Needs (12/14/2021)   PRAPARE - Hydrologist (Medical): No    Lack of Transportation (Non-Medical): No  Physical Activity: Inactive (02/21/2017)   Exercise Vital Sign    Days of Exercise per Week: 0 days    Minutes of Exercise per Session: 0 min  Stress: No Stress Concern Present (02/21/2017)   [place]    Feeling of Stress : Not at all  Social Connections: Somewhat Isolated (02/21/2017)   Social Connection and Isolation Panel [NHANES]    Frequency of Communication with Friends and Family: More than three times a week    Frequency of Social Gatherings with Friends and Family: Once a week    Attends Religious Services: 1 to 4 times per year    Active Member of [employer] or Organizations: No    Attends Archivist Meetings: Never    Marital Status: Never married  Intimate Partner Violence: Not At Risk (11/19/2021)   Humiliation, Afraid, Rape, and Kick questionnaire    Fear of Current or Ex-Partner: No    Emotionally Abused: No    Physically Abused: No    Sexually Abused: No    Physical Exam      Future Appointments  Date Time Provider [place]  01/11/2022  9:00 AM [facility] PA/NP [facility] None  01/31/2022  7:05 AM [facility] DEVICE REMOTES [facility]  03/07/2022  7:05 AM [facility] DEVICE REMOTES [facility]  04/11/2022  7:05 AM [facility] DEVICE REMOTES [facility]  05/09/2022  1:50 PM Charlott Rakes, MD [facility] None       [name], EMT-P-Paramedic [place] [facility]  12/29/21

## 2021-12-30 ENCOUNTER — Other Ambulatory Visit (HOSPITAL_COMMUNITY): Payer: Self-pay | Admitting: Emergency Medicine

## 2021-12-30 NOTE — Progress Notes (Signed)
Dropped by for blood pressure check after holding last nights dose of Entresto 24-26. BP 120/80, HR 80, R14.  She states she feels much better today than yesterday.  No chest pain or SOB.      [name], [place] 12/30/2021

## 2021-12-31 NOTE — Telephone Encounter (Signed)
Advanced Heart Failure Patient Advocate Encounter  Patient was approved to receive Entresto from [employer] Effective dates: 12/31/21 to 01/01/23 Determination letter has been added to patient chart. Spoke to patient by phone to confirm.  Clista Bernhardt, CPhT Rx Patient Advocate Phone: [phone]

## 2022-01-03 ENCOUNTER — Telehealth: Payer: Self-pay

## 2022-01-03 NOTE — Telephone Encounter (Signed)
ILR @ RRT 12/20/2021. Attempted to contact patient to advise. No answer, LMTCB.

## 2022-01-05 ENCOUNTER — Other Ambulatory Visit (HOSPITAL_COMMUNITY): Payer: Self-pay | Admitting: Emergency Medicine

## 2022-01-05 ENCOUNTER — Telehealth (HOSPITAL_COMMUNITY): Payer: Self-pay | Admitting: Licensed Clinical Social Worker

## 2022-01-05 NOTE — Telephone Encounter (Signed)
Spoke with patient informed her that her loop recorder has reached [facility]RT and she could either leave the loop recorder in or have it taken out patient would like some time to think about it and would call back.

## 2022-01-05 NOTE — Telephone Encounter (Signed)
HF Paramedicine Team Based Care Meeting  HF MD- NA  HF NP - [place] NP-C   [facility] admit within the last 30 days for heart failure? no  Medications concerns? Being pretty compliant with meds- continuing to work with pt on learning how to do her own med box.  Eligible for discharge? Continue eduction on med box and on getting insurance.  Jorge Ny, LCSW Clinical Social Worker Advanced Heart Failure Clinic Desk#: [phone] Cell#: [phone]

## 2022-01-05 NOTE — Progress Notes (Signed)
Paramedicine Encounter    Patient ID: Leslie Walker, female    DOB: [birth year], 56 y.o.   MRN: [id number]   LMP  (LMP Unknown) Comment: Pt unsure why she no longer gets her period Weight yesterday-not taken Last visit weight-133lb  ATF Ms. Rizzo A&O x 4, skin W&D w/ good color.  She has only missed to PM doses of her meds.  She reports to be feeling good.  No chest pain, SOB and no edema noted.  Med box reconciled x 1 week.  No further needs at this time. Home visit complete.    [name], [place] 01/05/2022     Patient Care Team: Elbert Ewings, FNP as PCP - General (Nurse Practitioner) Pixie Casino, MD as PCP - Cardiology (Cardiology)  Patient Active Problem List   Diagnosis Date Noted   Coronary artery disease 11/20/2021   Elevated troponin 11/19/2021   Heart failure ([place]) 11/19/2021   Acute on chronic systolic heart failure ([place]) 50/03/7046   Chronic systolic heart failure (HCC)    Chest pain, precordial 10/09/2020   Cocaine use 10/09/2020   NSTEMI (non-ST elevated myocardial infarction) ([place]) 10/09/2020   Polysubstance abuse ([place]) 10/09/2020   Hyperlipidemia 10/09/2020   Lung nodule 10/09/2020   Tobacco dependence 10/09/2020   Screening breast examination 09/04/2018   Heroin dependence ([place]) 06/29/2018   TIA (transient ischemic attack) 06/24/2018   Nail fungus 04/20/2015   Porokeratosis 04/20/2015   History of ulceration 04/20/2015   Foot ulcer ([place]) 02/17/2014   Cellulitis of foot, right 02/17/2014   Chronic pain syndrome 02/17/2014   Essential hypertension 02/17/2014    Current Outpatient Medications:    atorvastatin (LIPITOR) 40 MG tablet, Take 1 tablet (40 mg total) by mouth daily at 6 PM., Disp: 30 tablet, Rfl: 3   carvedilol (COREG) 3.125 MG tablet, Take 1 tablet (3.125 mg total) by mouth 2 (two) times daily with a meal., Disp: 60 tablet, Rfl: 1   clopidogrel (PLAVIX) 75 MG tablet, Take 1 tablet (75 mg total) by mouth daily.,  Disp: 30 tablet, Rfl: 1   empagliflozin (JARDIANCE) 10 MG TABS tablet, Take 1 tablet (10 mg total) by mouth daily., Disp: 30 tablet, Rfl: 1   ezetimibe (ZETIA) 10 MG tablet, Take 1 tablet (10 mg total) by mouth daily., Disp: 30 tablet, Rfl: 3   sacubitril-valsartan (ENTRESTO) 24-26 MG, Take 1 tablet by mouth 2 (two) times daily., Disp: 60 tablet, Rfl: 0   spironolactone (ALDACTONE) 25 MG tablet, Take 1 tablet (25 mg total) by mouth daily., Disp: 30 tablet, Rfl: 1   torsemide (DEMADEX) 20 MG tablet, Take 1 tablet (20 mg total) by mouth as needed., Disp: 30 tablet, Rfl: 1 No Known Allergies    Social History   Socioeconomic History   Marital status: Single    Spouse name: Not on file   Number of children: 3   Years of education: Not on file   Highest education level: 12th grade  Occupational History   Not on file  Tobacco Use   Smoking status: Some Days    Types: Cigars   Smokeless tobacco: Never  Vaping Use   Vaping Use: Never used  Substance and Sexual Activity   Alcohol use: Yes    Comment: social drinking   Drug use: Not Currently    Types: Marijuana, Cocaine    Comment: 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   Sexual activity: Not Currently    Birth control/protection: Post-menopausal  Other Topics Concern   Not  on file  Social History Narrative   Not on file   Social Determinants of Health   Financial Resource Strain: High Risk (12/14/2021)   Overall Financial Resource Strain (CARDIA)    Difficulty of Paying Living Expenses: Hard  Food Insecurity: No Food Insecurity (12/14/2021)   Hunger Vital Sign    Worried About Running Out of Food in the Last Year: Never true    Ran Out of Food in the Last Year: Never true  Transportation Needs: No Transportation Needs (12/14/2021)   PRAPARE - Hydrologist (Medical): No    Lack of Transportation (Non-Medical): No  Physical Activity: Inactive (02/21/2017)   Exercise Vital Sign    Days of Exercise per  Week: 0 days    Minutes of Exercise per Session: 0 min  Stress: No Stress Concern Present (02/21/2017)   [place]    Feeling of Stress : Not at all  Social Connections: Somewhat Isolated (02/21/2017)   Social Connection and Isolation Panel [NHANES]    Frequency of Communication with Friends and Family: More than three times a week    Frequency of Social Gatherings with Friends and Family: Once a week    Attends Religious Services: 1 to 4 times per year    Active Member of [employer] or Organizations: No    Attends Archivist Meetings: Never    Marital Status: Never married  Intimate Partner Violence: Not At Risk (11/19/2021)   Humiliation, Afraid, Rape, and Kick questionnaire    Fear of Current or Ex-Partner: No    Emotionally Abused: No    Physically Abused: No    Sexually Abused: No    Physical Exam      Future Appointments  Date Time Provider [place]  01/11/2022  9:00 AM [facility] PA/NP [facility] None  05/09/2022  1:50 PM Charlott Rakes, MD [facility] None       [name], EMT-P-Paramedic [place] [facility]  01/05/22

## 2022-01-06 ENCOUNTER — Other Ambulatory Visit: Payer: Self-pay

## 2022-01-10 NOTE — Progress Notes (Incomplete)
Advanced Heart Failure Team Note      PCP: Elbert Ewings, NP Primary Cardiologist: Dr. Debara Pickett HF Cardiologist: Dr. Aundra Dubin  HPI:  56 year old with a history of HTN, hyperlipidemia, stroke cypotogenic 2020, LINQ 2020, CAD, polysubstance abuse heroin/cocaine abuse, and chronic HFrEF.    Admitted 09/2020 with NSTEMI. CT negative for PE but did show multivessel coronary calcium. Echo showed EF 45-50%, RV norma, AV moderate aortic valve stenosis. Had Cath with overlapping DES to mid RCA, 70-80% left circumflex, left main patent, mid LAD 55%. Plan for DAPT with Aspirin + Brillinta x12 months.    She was last seen by [facility] in May, 2023 .  At that time she was having a hard time paying for medications. She no showed for the July appointment.    Admitted 11/23 with a/c CHF. 3 months PTA she ran out of all medications. Says she didn't call for refills.   Last used cocaine 7 days prior and heroin about 2 weeks prior. She was diuresed with IV lasix and GDMT titrated. She was enrolled in paramedicine program. Echo with EF 20-25%, RV okay, moderate to severe AI with possible mobile mass. TEE: EF 20%, mildly reduced RV, bicuspid aortic valve with moderate to severe AI.  R/LHC: 75% m Lcx stenosis, elevated PCWP and LVEDP but normal RV pressure, moderate pulmonary venous hypertension, CI low by thermo but preserved by Fick.  cMRI: LVEF 18%, RVEF 48%, bicuspid aortic valve with severe AI, noncoronary LGE pattern, mid-wall LGE in inferolateral wall could be d/t prior myocarditis but patient has CAD in Lcx chronically.  Structural heart team reviewed her studies, not felt to be a candidate for TAVR.  Today she returns for HF follow up.Overall feeling fine. Denies SOB/PND/Orthopnea. Appetite ok. No fever or chills. Weight at home  pounds. Taking all medications.   . Uninsured. She has no working car. Lives with her daughter. Currently not working.    ROS: All systems negative except as listed in  HPI, PMH and Problem List.  SH:  Social History   Socioeconomic History   Marital status: Single    Spouse name: Not on file   Number of children: 3   Years of education: Not on file   Highest education level: 12th grade  Occupational History   Not on file  Tobacco Use   Smoking status: Some Days    Types: Cigars   Smokeless tobacco: Never  Vaping Use   Vaping Use: Never used  Substance and Sexual Activity   Alcohol use: Yes    Comment: social drinking   Drug use: Not Currently    Types: Marijuana, Cocaine    Comment: 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   Sexual activity: Not Currently    Birth control/protection: Post-menopausal  Other Topics Concern   Not on file  Social History Narrative   Not on file   Social Determinants of Health   Financial Resource Strain: High Risk (12/14/2021)   Overall Financial Resource Strain (CARDIA)    Difficulty of Paying Living Expenses: Hard  Food Insecurity: No Food Insecurity (12/14/2021)   Hunger Vital Sign    Worried About Running Out of Food in the Last Year: Never true    Ran Out of Food in the Last Year: Never true  Transportation Needs: No Transportation Needs (12/14/2021)   PRAPARE - Hydrologist (Medical): No    Lack of Transportation (Non-Medical): No  Physical Activity: Inactive (02/21/2017)   Exercise  Vital Sign    Days of Exercise per Week: 0 days    Minutes of Exercise per Session: 0 min  Stress: No Stress Concern Present (02/21/2017)   [place]    Feeling of Stress : Not at all  Social Connections: Somewhat Isolated (02/21/2017)   Social Connection and Isolation Panel [NHANES]    Frequency of Communication with Friends and Family: More than three times a week    Frequency of Social Gatherings with Friends and Family: Once a week    Attends Religious Services: 1 to 4 times per year    Active Member of [employer] or Organizations:  No    Attends Archivist Meetings: Never    Marital Status: Never married  Intimate Partner Violence: Not At Risk (11/19/2021)   Humiliation, Afraid, Rape, and Kick questionnaire    Fear of Current or Ex-Partner: No    Emotionally Abused: No    Physically Abused: No    Sexually Abused: No    FH:  Family History  Problem Relation Age of Onset   Hypertension Mother    Hypertension Father    Hypertension Brother    Stroke Sister     Past Medical History:  Diagnosis Date   Chronic systolic heart failure ([place])    Cocaine abuse ([place])    Dyslipidemia    Hypertension    Noncompliance w/medication treatment due to intermit use of medication    Stroke ([place]) 2021   no deficits    Current Outpatient Medications  Medication Sig Dispense Refill   atorvastatin (LIPITOR) 40 MG tablet Take 1 tablet (40 mg total) by mouth daily at 6 PM. 30 tablet 3   carvedilol (COREG) 3.125 MG tablet Take 1 tablet (3.125 mg total) by mouth 2 (two) times daily with a meal. 60 tablet 1   clopidogrel (PLAVIX) 75 MG tablet Take 1 tablet (75 mg total) by mouth daily. 30 tablet 1   empagliflozin (JARDIANCE) 10 MG TABS tablet Take 1 tablet (10 mg total) by mouth daily. 30 tablet 1   ezetimibe (ZETIA) 10 MG tablet Take 1 tablet (10 mg total) by mouth daily. 30 tablet 3   sacubitril-valsartan (ENTRESTO) 24-26 MG Take 1 tablet by mouth 2 (two) times daily. 60 tablet 0   spironolactone (ALDACTONE) 25 MG tablet Take 1 tablet (25 mg total) by mouth daily. 30 tablet 1   torsemide (DEMADEX) 20 MG tablet Take 1 tablet (20 mg total) by mouth as needed. 30 tablet 1   No current facility-administered medications for this visit.    There were no vitals filed for this visit.   PHYSICAL EXAM: General:  Well appearing. No resp difficulty HEENT: normal Neck: supple. no JVD. Carotids 2+ bilat; no bruits. No lymphadenopathy or thryomegaly appreciated. Cor: PMI nondisplaced. Regular rate & rhythm. No rubs, gallops  or murmurs. Lungs: clear Abdomen: soft, nontender, nondistended. No hepatosplenomegaly. No bruits or masses. Good bowel sounds. Extremities: no cyanosis, clubbing, rash, edema Neuro: alert & orientedx3, cranial nerves grossly intact. moves all 4 extremities w/o difficulty. Affect pleasant   ASSESSMENT & PLAN:   1. Chronic systolic CHF/NICM: Prior echo with EF 45-50%.  Echo this admission with EF 20-25%, global hypokinesis, moderate LV dilation, normal RV, mod-severe AI.  RHC showed significantly elevated PCWP and LVEDP but normal RA pressure.  CI 2.4 Fick/2.0 thermo.  Cause of cardiomyopathy uncertain, CAD does not appear to have progressed (75% mLCx stenosis was also noted on prior  cath).  Possibly some component d/t to substance abuse/cocaine though by cardiac MRI, prior myocarditis is also a concern (non-coronary LGE noted).  LV EF 18% on cMRI with relatively preserved RV function.  Aortic insufficiency also likely plays a role.  - NYHA  -Volume status  - Coreg 3.125 mg bid.  - Jardiance 10 mg daily - Entresto 24/26 mg bid - Spironolactone 25 mg daily.  2. CAD: DES to RCA in 9/22.  No chest pain prior to this admission. Troponin mildly elevated with no trend, likely demand ischemia with volume overload.  Cath this admission with patent RCA stent, 50% proximal LAD, 75% mid LCx.  The LCx stenosis appeared similar to the prior cath.  Given lack of chest pain, would manage medically. Lp(a) markedly elevated with age-advanced CAD.  - Continue Plavix 75 daily.  - Continue statin - With elevated Lp(a), will need to be aggressive with lipid management and will try to get her into lipid clinic for consideration of PCSK9 inhibitor once she has insurance 3. Aortic valve disorder: Moderate-severe AI on echo. TEE done, she has a bicuspid aortic valve with fusion of right and noncoronary cusps. There is no significant stenosis and no evidence for endocarditis, moderate-severe AI by TEE.  By cardiac MRI,  aortic valve regurgitant fraction was 38% which suggests severe AI.   - Imaging reviewed with structural team. Not a candidate for TAVR. Continue to optimize HF management and repeat echo at f/u in 2 months. Depending on results, may need referral to TCTS for consideration of AVR - Recommended screening in 1st degree relatives for bicuspid aortic valve 4. Polysubstance abuse: Cocaine/heroin.  She does not inject the heroin.  - Needs to quit. Denies use since discharge from hospital 5. CVA: 2020, has LINQ monitor. Multiple tachy/AF episodes recorded. Reviewed with [employer] device rep. Episodes appear consistent with atrial tachycardia and T wave oversensing.  SDOH: Enrolled in Paramedicine program. Has been compliant with medications -Uninsured. Planning to apply for medicaid and disability.  -Longstanding history of substance abuse -Engaged HF CSW

## 2022-01-11 ENCOUNTER — Encounter (HOSPITAL_COMMUNITY): Payer: Self-pay | Admitting: Emergency Medicine

## 2022-01-11 ENCOUNTER — Encounter (HOSPITAL_COMMUNITY): Payer: No Typology Code available for payment source

## 2022-01-11 ENCOUNTER — Telehealth (HOSPITAL_COMMUNITY): Payer: Self-pay | Admitting: Emergency Medicine

## 2022-01-11 NOTE — Progress Notes (Signed)
Leslie Walker was no show for her clinic visit today.  I did call and LVM reminding her and asking her to call me back.    [name], [place] 01/11/2022

## 2022-01-11 NOTE — Telephone Encounter (Signed)
Called with no answer.  LVM to remind her of her appointment this morning at the HF Clinic.    [name], [place] 01/11/2022

## 2022-01-13 ENCOUNTER — Telehealth (HOSPITAL_COMMUNITY): Payer: Self-pay | Admitting: Emergency Medicine

## 2022-01-13 NOTE — Telephone Encounter (Signed)
Called Leslie Walker and left voicemail for her to call me back in hopes of coming by and doing a home visit today or at least get one scheduled.    [name], [place] 01/13/2022

## 2022-01-18 ENCOUNTER — Other Ambulatory Visit: Payer: Self-pay

## 2022-01-18 ENCOUNTER — Other Ambulatory Visit (HOSPITAL_COMMUNITY): Payer: Self-pay | Admitting: Emergency Medicine

## 2022-01-18 NOTE — Progress Notes (Signed)
Paramedicine Encounter    Patient ID: Leslie Walker, female    DOB: [birth year], 57 y.o.   MRN: [id number]   BP 138/78 (BP Location: Left Arm, Patient Position: Sitting, Cuff Size: Normal)   Pulse 86   Resp 16   Wt 135 lb 12.8 oz (61.6 kg)   LMP  (LMP Unknown) Comment: Pt unsure why she no longer gets her period  SpO2 99%   BMI 21.92 kg/m  Weight yesterday-not taken Last visit weight-134lb   ATF Ms. [place] A&O x 4, skin W&D w/ good color.  Pt. Denies chest pain or SOB.  Lung sounds clear and equal bilat.  No edema noted.  She only missed 1 dose of her meds.  She does say she used drugs once over the Christmas holiday.  Discussed the importance of abstinence for obvious reasons. Med box reconciled.  Refills called in.  Home visit complete.    [name], [place] 01/19/2022     Patient Care Team: Elbert Ewings, FNP as PCP - General (Nurse Practitioner) Pixie Casino, MD as PCP - Cardiology (Cardiology)  Patient Active Problem List   Diagnosis Date Noted   Coronary artery disease 11/20/2021   Elevated troponin 11/19/2021   Heart failure ([place]) 11/19/2021   Acute on chronic systolic heart failure ([place]) 74/12/8784   Chronic systolic heart failure (HCC)    Chest pain, precordial 10/09/2020   Cocaine use 10/09/2020   NSTEMI (non-ST elevated myocardial infarction) ([place]) 10/09/2020   Polysubstance abuse ([place]) 10/09/2020   Hyperlipidemia 10/09/2020   Lung nodule 10/09/2020   Tobacco dependence 10/09/2020   Screening breast examination 09/04/2018   Heroin dependence ([place]) 06/29/2018   TIA (transient ischemic attack) 06/24/2018   Nail fungus 04/20/2015   Porokeratosis 04/20/2015   History of ulceration 04/20/2015   Foot ulcer ([place]) 02/17/2014   Cellulitis of foot, right 02/17/2014   Chronic pain syndrome 02/17/2014   Essential hypertension 02/17/2014    Current Outpatient Medications:    atorvastatin (LIPITOR) 40 MG tablet, Take 1 tablet (40 mg total)  by mouth daily at 6 PM., Disp: 30 tablet, Rfl: 3   carvedilol (COREG) 3.125 MG tablet, Take 1 tablet (3.125 mg total) by mouth 2 (two) times daily with a meal., Disp: 60 tablet, Rfl: 1   clopidogrel (PLAVIX) 75 MG tablet, Take 1 tablet (75 mg total) by mouth daily., Disp: 30 tablet, Rfl: 1   empagliflozin (JARDIANCE) 10 MG TABS tablet, Take 1 tablet (10 mg total) by mouth daily., Disp: 30 tablet, Rfl: 1   ezetimibe (ZETIA) 10 MG tablet, Take 1 tablet (10 mg total) by mouth daily., Disp: 30 tablet, Rfl: 3   sacubitril-valsartan (ENTRESTO) 24-26 MG, Take 1 tablet by mouth 2 (two) times daily., Disp: 60 tablet, Rfl: 0   spironolactone (ALDACTONE) 25 MG tablet, Take 1 tablet (25 mg total) by mouth daily., Disp: 30 tablet, Rfl: 1   torsemide (DEMADEX) 20 MG tablet, Take 1 tablet (20 mg total) by mouth as needed., Disp: 30 tablet, Rfl: 1 No Known Allergies    Social History   Socioeconomic History   Marital status: Single    Spouse name: Not on file   Number of children: 3   Years of education: Not on file   Highest education level: 12th grade  Occupational History   Not on file  Tobacco Use   Smoking status: Some Days    Types: Cigars   Smokeless tobacco: Never  Vaping Use   Vaping Use: Never used  Substance and Sexual Activity   Alcohol use: Yes    Comment: social drinking   Drug use: Not Currently    Types: Marijuana, Cocaine    Comment: 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   Sexual activity: Not Currently    Birth control/protection: Post-menopausal  Other Topics Concern   Not on file  Social History Narrative   Not on file   Social Determinants of Health   Financial Resource Strain: High Risk (12/14/2021)   Overall Financial Resource Strain (CARDIA)    Difficulty of Paying Living Expenses: Hard  Food Insecurity: No Food Insecurity (12/14/2021)   Hunger Vital Sign    Worried About Running Out of Food in the Last Year: Never true    Ran Out of Food in the Last Year: Never  true  Transportation Needs: No Transportation Needs (12/14/2021)   PRAPARE - Hydrologist (Medical): No    Lack of Transportation (Non-Medical): No  Physical Activity: Inactive (02/21/2017)   Exercise Vital Sign    Days of Exercise per Week: 0 days    Minutes of Exercise per Session: 0 min  Stress: No Stress Concern Present (02/21/2017)   [place]    Feeling of Stress : Not at all  Social Connections: Somewhat Isolated (02/21/2017)   Social Connection and Isolation Panel [NHANES]    Frequency of Communication with Friends and Family: More than three times a week    Frequency of Social Gatherings with Friends and Family: Once a week    Attends Religious Services: 1 to 4 times per year    Active Member of [employer] or Organizations: No    Attends Archivist Meetings: Never    Marital Status: Never married  Intimate Partner Violence: Not At Risk (11/19/2021)   Humiliation, Afraid, Rape, and Kick questionnaire    Fear of Current or Ex-Partner: No    Emotionally Abused: No    Physically Abused: No    Sexually Abused: No    Physical Exam      Future Appointments  Date Time Provider [place]  02/02/2022  3:00 PM [facility] PA/NP [facility] None  05/09/2022  1:50 PM Charlott Rakes, MD [facility] None       [name], [facility] Paramedic  01/18/22

## 2022-01-20 ENCOUNTER — Other Ambulatory Visit: Payer: Self-pay

## 2022-01-25 ENCOUNTER — Other Ambulatory Visit (HOSPITAL_COMMUNITY): Payer: Self-pay | Admitting: Emergency Medicine

## 2022-01-25 NOTE — Progress Notes (Signed)
Paramedicine Encounter    Patient ID: Leslie Walker, female    DOB: 24-Dec-1965, 57 y.o.   MRN: [id number]   BP 110/78 (BP Location: Left Arm, Patient Position: Sitting, Cuff Size: Normal)   Pulse 85   Resp 14   Wt 135 lb 6.4 oz (61.4 kg)   LMP  (LMP Unknown) Comment: Pt unsure why she no longer gets her period  SpO2 98%   BMI 21.85 kg/m  Weight yesterday-not taken Last visit weight-135lb  ATF Ms. [place] A&O x 4, skin W&D w/ good color.  Pt. Missed only 1 dose out of the past week.  Med box reconciled x 1 week.  Pt. Did well to call to request her own refills for her Entresto.  Pt. Denies chest pain or SOB.  Lung sounds clear and equal bilat.  No edema noted.  Pt. Has appointment at the HF Clinic next Wednesday 1/17 @ 3:00.  I will attend this appointment with her.  Home visit complete.    [name], [place] 01/25/2022   Patient Care Team: Elbert Ewings, FNP as PCP - General (Nurse Practitioner) Pixie Casino, MD as PCP - Cardiology (Cardiology)  Patient Active Problem List   Diagnosis Date Noted   Coronary artery disease 11/20/2021   Elevated troponin 11/19/2021   Heart failure ([place]) 11/19/2021   Acute on chronic systolic heart failure ([place]) 17/40/8144   Chronic systolic heart failure (HCC)    Chest pain, precordial 10/09/2020   Cocaine use 10/09/2020   NSTEMI (non-ST elevated myocardial infarction) ([place]) 10/09/2020   Polysubstance abuse ([place]) 10/09/2020   Hyperlipidemia 10/09/2020   Lung nodule 10/09/2020   Tobacco dependence 10/09/2020   Screening breast examination 09/04/2018   Heroin dependence ([place]) 06/29/2018   TIA (transient ischemic attack) 06/24/2018   Nail fungus 04/20/2015   Porokeratosis 04/20/2015   History of ulceration 04/20/2015   Foot ulcer ([place]) 02/17/2014   Cellulitis of foot, right 02/17/2014   Chronic pain syndrome 02/17/2014   Essential hypertension 02/17/2014    Current Outpatient Medications:    atorvastatin  (LIPITOR) 40 MG tablet, Take 1 tablet (40 mg total) by mouth daily at 6 PM., Disp: 30 tablet, Rfl: 3   carvedilol (COREG) 3.125 MG tablet, Take 1 tablet (3.125 mg total) by mouth 2 (two) times daily with a meal., Disp: 60 tablet, Rfl: 1   clopidogrel (PLAVIX) 75 MG tablet, Take 1 tablet (75 mg total) by mouth daily., Disp: 30 tablet, Rfl: 1   empagliflozin (JARDIANCE) 10 MG TABS tablet, Take 1 tablet (10 mg total) by mouth daily., Disp: 30 tablet, Rfl: 1   ezetimibe (ZETIA) 10 MG tablet, Take 1 tablet (10 mg total) by mouth daily., Disp: 30 tablet, Rfl: 3   sacubitril-valsartan (ENTRESTO) 24-26 MG, Take 1 tablet by mouth 2 (two) times daily., Disp: 60 tablet, Rfl: 0   spironolactone (ALDACTONE) 25 MG tablet, Take 1 tablet (25 mg total) by mouth daily., Disp: 30 tablet, Rfl: 1   torsemide (DEMADEX) 20 MG tablet, Take 1 tablet (20 mg total) by mouth as needed., Disp: 30 tablet, Rfl: 1 No Known Allergies    Social History   Socioeconomic History   Marital status: Single    Spouse name: Not on file   Number of children: 3   Years of education: Not on file   Highest education level: 12th grade  Occupational History   Not on file  Tobacco Use   Smoking status: Some Days    Types: Cigars  Smokeless tobacco: Never  Vaping Use   Vaping Use: Never used  Substance and Sexual Activity   Alcohol use: Yes    Comment: social drinking   Drug use: Not Currently    Types: Marijuana, Cocaine    Comment: 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   Sexual activity: Not Currently    Birth control/protection: Post-menopausal  Other Topics Concern   Not on file  Social History Narrative   Not on file   Social Determinants of Health   Financial Resource Strain: High Risk (12/14/2021)   Overall Financial Resource Strain (CARDIA)    Difficulty of Paying Living Expenses: Hard  Food Insecurity: No Food Insecurity (12/14/2021)   Hunger Vital Sign    Worried About Running Out of Food in the Last Year:  Never true    Ran Out of Food in the Last Year: Never true  Transportation Needs: No Transportation Needs (12/14/2021)   PRAPARE - Hydrologist (Medical): No    Lack of Transportation (Non-Medical): No  Physical Activity: Inactive (02/21/2017)   Exercise Vital Sign    Days of Exercise per Week: 0 days    Minutes of Exercise per Session: 0 min  Stress: No Stress Concern Present (02/21/2017)   [place]    Feeling of Stress : Not at all  Social Connections: Somewhat Isolated (02/21/2017)   Social Connection and Isolation Panel [NHANES]    Frequency of Communication with Friends and Family: More than three times a week    Frequency of Social Gatherings with Friends and Family: Once a week    Attends Religious Services: 1 to 4 times per year    Active Member of [employer] or Organizations: No    Attends Archivist Meetings: Never    Marital Status: Never married  Intimate Partner Violence: Not At Risk (11/19/2021)   Humiliation, Afraid, Rape, and Kick questionnaire    Fear of Current or Ex-Partner: No    Emotionally Abused: No    Physically Abused: No    Sexually Abused: No    Physical Exam      Future Appointments  Date Time Provider [place]  02/02/2022  3:00 PM [facility] PA/NP [facility] None  05/09/2022  1:50 PM Charlott Rakes, MD [facility] None       [name], [place] Paramedic  01/25/22

## 2022-02-02 ENCOUNTER — Other Ambulatory Visit (HOSPITAL_COMMUNITY): Payer: Self-pay | Admitting: Emergency Medicine

## 2022-02-02 ENCOUNTER — Ambulatory Visit (HOSPITAL_COMMUNITY)
Admission: RE | Admit: 2022-02-02 | Discharge: 2022-02-02 | Disposition: A | Discharge status: Home / Self Care | Payer: Self-pay | Source: Ambulatory Visit | Attending: Adult Health | Admitting: Adult Health

## 2022-02-02 ENCOUNTER — Other Ambulatory Visit: Payer: Self-pay

## 2022-02-02 VITALS — BP 120/80 | HR 79 | Wt 138.0 lb

## 2022-02-02 DIAGNOSIS — E785 Hyperlipidemia, unspecified: Secondary | ICD-10-CM | POA: Insufficient documentation

## 2022-02-02 DIAGNOSIS — Z7984 Long term (current) use of oral hypoglycemic drugs: Secondary | ICD-10-CM | POA: Insufficient documentation

## 2022-02-02 DIAGNOSIS — F1410 Cocaine abuse, uncomplicated: Secondary | ICD-10-CM | POA: Insufficient documentation

## 2022-02-02 DIAGNOSIS — I359 Nonrheumatic aortic valve disorder, unspecified: Secondary | ICD-10-CM | POA: Insufficient documentation

## 2022-02-02 DIAGNOSIS — I2510 Atherosclerotic heart disease of native coronary artery without angina pectoris: Secondary | ICD-10-CM | POA: Insufficient documentation

## 2022-02-02 DIAGNOSIS — F1910 Other psychoactive substance abuse, uncomplicated: Secondary | ICD-10-CM | POA: Insufficient documentation

## 2022-02-02 DIAGNOSIS — Z7902 Long term (current) use of antithrombotics/antiplatelets: Secondary | ICD-10-CM | POA: Insufficient documentation

## 2022-02-02 DIAGNOSIS — Z79899 Other long term (current) drug therapy: Secondary | ICD-10-CM | POA: Insufficient documentation

## 2022-02-02 DIAGNOSIS — I5022 Chronic systolic (congestive) heart failure: Secondary | ICD-10-CM | POA: Insufficient documentation

## 2022-02-02 DIAGNOSIS — I428 Other cardiomyopathies: Secondary | ICD-10-CM | POA: Insufficient documentation

## 2022-02-02 DIAGNOSIS — Z955 Presence of coronary angioplasty implant and graft: Secondary | ICD-10-CM | POA: Insufficient documentation

## 2022-02-02 LAB — BASIC METABOLIC PANEL
Anion gap: 9 (ref 5–15)
BUN: 17 mg/dL (ref 6–20)
CO2: 25 mmol/L (ref 22–32)
Calcium: 8.9 mg/dL (ref 8.9–10.3)
Chloride: 103 mmol/L (ref 98–111)
Creatinine, Ser: 1.08 mg/dL — ABNORMAL HIGH (ref 0.44–1.00)
GFR, Estimated: 60 mL/min — ABNORMAL LOW (ref >60)
Glucose, Bld: 109 mg/dL — ABNORMAL HIGH (ref 70–99)
Potassium: 4.2 mmol/L (ref 3.5–5.1)
Sodium: 137 mmol/L (ref 135–145)

## 2022-02-02 MED ORDER — CARVEDILOL 6.25 MG PO TABS
6.2500 mg | ORAL_TABLET | Freq: Two times a day (BID) | ORAL | 3 refills | Status: DC
  Filled 2022-02-02: qty 180, 90d supply, fill #0
  Filled 2022-05-04 – 2022-05-18 (×2): qty 180, 90d supply, fill #1
  Filled 2022-08-24 (×2): qty 180, 90d supply, fill #2

## 2022-02-02 NOTE — Progress Notes (Signed)
Advanced Heart Failure Team Note      PCP: Elbert Ewings, NP Primary Cardiologist: Dr. Debara Pickett HF Cardiologist: Dr. Aundra Dubin  HPI: Ms Houseman is a 57 year old with a history of HTN, hyperlipidemia, stroke cypotogenic 2020, LINQ 2020, CAD, polysubstance abuse heroin/cocaine abuse, and chronic HFrEF.    Admitted 09/2020 with NSTEMI. CT negative for PE but did show multivessel coronary calcium. Echo showed EF 45-50%, RV norma, AV moderate aortic valve stenosis. Had Cath with overlapping DES to mid RCA, 70-80% left circumflex, left main patent, mid LAD 55%. Plan for DAPT with Aspirin + Brillinta x12 months.    She was last seen by [facility] in May, 2023 .  At that time she was having a hard time paying for medications. She no showed for the July appointment.    Admitted 11/23 with a/c CHF. 3 months PTA she ran out of all medications. Says she didn't call for refills.   Last used cocaine 7 days prior and heroin about 2 weeks prior. She was diuresed with IV lasix and GDMT titrated. She was enrolled in paramedicine program. Echo with EF 20-25%, RV okay, moderate to severe AI with possible mobile mass. TEE: EF 20%, mildly reduced RV, bicuspid aortic valve with moderate to severe AI.  R/LHC: 75% m Lcx stenosis, elevated PCWP and LVEDP but normal RV pressure, moderate pulmonary venous hypertension, CI low by thermo but preserved by Fick.  cMRI: LVEF 18%, RVEF 48%, bicuspid aortic valve with severe AI, noncoronary LGE pattern, mid-wall LGE in inferolateral wall could be d/t prior myocarditis but patient has CAD in Lcx chronically.  Structural heart team reviewed her studies, not felt to be a candidate for TAVR.  Today she returns for HF follow up.Overall feeling fine. Denies SOB/PND/Orthopnea. Appetite ok. No fever or chills. Weight at home stable.   pounds. Taking all medications.. Uninsured. She has no working car. Lives with her daughter. Family transports to appointments. Currently not working  but wants to go back to work. Stressed about finances. Followed by HF Paramedicine.    ROS: All systems negative except as listed in HPI, PMH and Problem List.  SH:  Social History   Socioeconomic History   Marital status: Single    Spouse name: Not on file   Number of children: 3   Years of education: Not on file   Highest education level: 12th grade  Occupational History   Not on file  Tobacco Use   Smoking status: Some Days    Types: Cigars   Smokeless tobacco: Never  Vaping Use   Vaping Use: Never used  Substance and Sexual Activity   Alcohol use: Yes    Comment: social drinking   Drug use: Not Currently    Types: Marijuana, Cocaine    Comment: 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   Sexual activity: Not Currently    Birth control/protection: Post-menopausal  Other Topics Concern   Not on file  Social History Narrative   Not on file   Social Determinants of Health   Financial Resource Strain: High Risk (12/14/2021)   Overall Financial Resource Strain (CARDIA)    Difficulty of Paying Living Expenses: Hard  Food Insecurity: No Food Insecurity (12/14/2021)   Hunger Vital Sign    Worried About Running Out of Food in the Last Year: Never true    Ran Out of Food in the Last Year: Never true  Transportation Needs: No Transportation Needs (12/14/2021)   PRAPARE - Transportation    Lack  of Transportation (Medical): No    Lack of Transportation (Non-Medical): No  Physical Activity: Inactive (02/21/2017)   Exercise Vital Sign    Days of Exercise per Week: 0 days    Minutes of Exercise per Session: 0 min  Stress: No Stress Concern Present (02/21/2017)   [place]    Feeling of Stress : Not at all  Social Connections: Somewhat Isolated (02/21/2017)   Social Connection and Isolation Panel [NHANES]    Frequency of Communication with Friends and Family: More than three times a week    Frequency of Social Gatherings  with Friends and Family: Once a week    Attends Religious Services: 1 to 4 times per year    Active Member of [employer] or Organizations: No    Attends Archivist Meetings: Never    Marital Status: Never married  Intimate Partner Violence: Not At Risk (11/19/2021)   Humiliation, Afraid, Rape, and Kick questionnaire    Fear of Current or Ex-Partner: No    Emotionally Abused: No    Physically Abused: No    Sexually Abused: No    FH:  Family History  Problem Relation Age of Onset   Hypertension Mother    Hypertension Father    Hypertension Brother    Stroke Sister     Past Medical History:  Diagnosis Date   Chronic systolic heart failure ([place])    Cocaine abuse ([place])    Dyslipidemia    Hypertension    Noncompliance w/medication treatment due to intermit use of medication    Stroke ([place]) 2021   no deficits    Current Outpatient Medications  Medication Sig Dispense Refill   atorvastatin (LIPITOR) 40 MG tablet Take 1 tablet (40 mg total) by mouth daily at 6 PM. 30 tablet 3   carvedilol (COREG) 3.125 MG tablet Take 1 tablet (3.125 mg total) by mouth 2 (two) times daily with a meal. 60 tablet 1   clopidogrel (PLAVIX) 75 MG tablet Take 1 tablet (75 mg total) by mouth daily. 30 tablet 1   empagliflozin (JARDIANCE) 10 MG TABS tablet Take 1 tablet (10 mg total) by mouth daily. 30 tablet 1   ezetimibe (ZETIA) 10 MG tablet Take 1 tablet (10 mg total) by mouth daily. 30 tablet 3   sacubitril-valsartan (ENTRESTO) 24-26 MG Take 1 tablet by mouth 2 (two) times daily. 60 tablet 0   spironolactone (ALDACTONE) 25 MG tablet Take 1 tablet (25 mg total) by mouth daily. 30 tablet 1   No current facility-administered medications for this encounter.    Vitals:   02/02/22 1510  BP: 120/80  Pulse: 79  SpO2: 99%  Weight: 62.6 kg (138 lb)   Wt Readings from Last 3 Encounters:  02/02/22 62.6 kg (138 lb)  02/02/22 61.6 kg (135 lb 12.8 oz)  01/25/22 61.4 kg (135 lb 6.4 oz)     PHYSICAL  EXAM: General:  Walked in the clinic.  No resp difficulty HEENT: normal Neck: supple. no JVD. Carotids 2+ bilat; no bruits. No lymphadenopathy or thryomegaly appreciated. Cor: PMI nondisplaced. Regular rate & rhythm. No rubs, gallops . RUSB 2/6 murmurs. Lungs: clear Abdomen: soft, nontender, nondistended. No hepatosplenomegaly. No bruits or masses. Good bowel sounds. Extremities: no cyanosis, clubbing, rash, edema Neuro: alert & orientedx3, cranial nerves grossly intact. moves all 4 extremities w/o difficulty. Affect pleasant   ASSESSMENT & PLAN:   1. Chronic systolic CHF/NICM: Prior echo with EF 45-50%.  Echo this admission  with EF 20-25%, global hypokinesis, moderate LV dilation, normal RV, mod-severe AI.  RHC showed significantly elevated PCWP and LVEDP but normal RA pressure.  CI 2.4 Fick/2.0 thermo.  Cause of cardiomyopathy uncertain, CAD does not appear to have progressed (75% mLCx stenosis was also noted on prior cath).  Possibly some component d/t to substance abuse/cocaine though by cardiac MRI, prior myocarditis is also a concern (non-coronary LGE noted).  LV EF 18% on cMRI with relatively preserved RV function.  Aortic insufficiency also likely plays a role.  - NYHAII.   -Volume status stable.  - Increased coreg to 6.25 mg twice a day.   - Jardiance 10 mg daily - Entresto 24/26 mg bid - Spironolactone 25 mg daily.  - Check BMET today.  2. CAD: DES to RCA in 9/22.  No chest pain prior to this admission. Troponin mildly elevated with no trend, likely demand ischemia with volume overload.  Cath this admission with patent RCA stent, 50% proximal LAD, 75% mid LCx.  The LCx stenosis appeared similar to the prior cath.  Given lack of chest pain, would manage medically. Lp(a) markedly elevated with age-advanced CAD.  - No chest pain.  - Continue Plavix 75 daily.  - Continue statin - With elevated Lp(a), will need to be aggressive with lipid management and will try to get her into lipid  clinic for consideration of PCSK9 inhibitor once she has insurance 3. Aortic valve disorder: Moderate-severe AI on echo. TEE done, she has a bicuspid aortic valve with fusion of right and noncoronary cusps. There is no significant stenosis and no evidence for endocarditis, moderate-severe AI by TEE.  By cardiac MRI, aortic valve regurgitant fraction was 38% which suggests severe AI.   - Imaging reviewed with structural team. Not a candidate for TAVR. Continue to optimize HF management and repeat echo next visit. Depending on results, may need referral to TCTS for consideration of AVR - Recommended screening in 1st degree relatives for bicuspid aortic valve 4. Polysubstance abuse: Cocaine/heroin.  She does not inject the heroin.  - No recent use.  5. Multiple tachy/AF episodes recorded.  Increase coreg to 6.25 mg twice a day.   Follow up in 2 months with Dr Aundra Dubin with an ECHO.    Cassondra Stachowski NP-C  5:11 PM

## 2022-02-02 NOTE — Patient Instructions (Addendum)
Thank you for coming in today  Labs were done today, if any labs are abnormal the clinic will call you No news is good news  INCREASE Coreg 6.25 mg 1 pill twice daily   You received a letter to return to work  Your physician recommends that you schedule a follow-up appointment in:  2 months with echocardiogram with Dr. Aundra Dubin  Your physician has requested that you have an echocardiogram. Echocardiography is a painless test that uses sound waves to create images of your heart. It provides your doctor with information about the size and shape of your heart and how well your heart's chambers and valves are working. This procedure takes approximately one hour. There are no restrictions for this procedure.     Do the following things EVERYDAY: Weigh yourself in the morning before breakfast. Write it down and keep it in a log. Take your medicines as prescribed Eat low salt foods--Limit salt (sodium) to 2000 mg per day.  Stay as active as you can everyday Limit all fluids for the day to less than 2 liters  At the [facility], you and your health needs are our priority. As part of our continuing mission to provide you with exceptional heart care, we have created designated Provider Care Teams. These Care Teams include your primary Cardiologist (physician) and Advanced Practice Providers (APPs- Physician Assistants and Nurse Practitioners) who all work together to provide you with the care you need, when you need it.   You may see any of the following providers on your designated Care Team at your next follow up: Dr Glori Bickers Dr Loralie Champagne Dr. Roxana Hires, NP Lyda Jester, [place] [facility] [place], [place] Forestine Na, NP Audry Riles, PharmD   Please be sure to bring in all your medications bottles to every appointment.   If you have any questions or concerns before your next appointment please send [place] a message through [place] or call  our office at [phone].    TO LEAVE A MESSAGE FOR THE NURSE SELECT OPTION 2, PLEASE LEAVE A MESSAGE INCLUDING: YOUR NAME DATE OF BIRTH CALL BACK NUMBER REASON FOR CALL**this is important as we prioritize the call backs  YOU WILL RECEIVE A CALL BACK THE SAME DAY AS LONG AS YOU CALL BEFORE 4:00 PM

## 2022-02-02 NOTE — Progress Notes (Signed)
Paramedicine Encounter    Patient ID: Leslie Walker, female    DOB: 1965-02-09, 57 y.o.   MRN: [id number]   Complaints No complaints  Assessment A&O x 4, No chest pain, SOB, or edema.  Lung sounds clear and equal bilat.  Compliance with meds Yes   Pill box filled Yes x 2 weeks  Refills needed none  Meds changes since last visit NONE    Social changes NONE   LMP  (LMP Unknown) Comment: Pt unsure why she no longer gets her period Weight yesterday-not taken Last visit weight-135lb   Med change from today's clinic visit was to increased Carvedilol from 3.'125mg'$  to 6.'25mg'$  BID.    ACTION: Home visit completed  Skipper Cliche [phone] 02/02/22  Patient Care Team: Elbert Ewings, FNP as PCP - General (Nurse Practitioner) Pixie Casino, MD as PCP - Cardiology (Cardiology)  Patient Active Problem List   Diagnosis Date Noted   Coronary artery disease 11/20/2021   Elevated troponin 11/19/2021   Heart failure ([place]) 11/19/2021   Acute on chronic systolic heart failure ([place]) 18/56/3149   Chronic systolic heart failure (H[facility])    Chest pain, precordial 10/09/2020   Cocaine use 10/09/2020   NSTEMI (non-ST elevated myocardial infarction) ([place]) 10/09/2020   Polysubstance abuse ([place]) 10/09/2020   Hyperlipidemia 10/09/2020   Lung nodule 10/09/2020   Tobacco dependence 10/09/2020   Screening breast examination 09/04/2018   Heroin dependence ([place]) 06/29/2018   TIA (transient ischemic attack) 06/24/2018   Nail fungus 04/20/2015   Porokeratosis 04/20/2015   History of ulceration 04/20/2015   Foot ulcer ([place]) 02/17/2014   Cellulitis of foot, right 02/17/2014   Chronic pain syndrome 02/17/2014   Essential hypertension 02/17/2014    Current Outpatient Medications:    atorvastatin (LIPITOR) 40 MG tablet, Take 1 tablet (40 mg total) by mouth daily at 6 PM., Disp: 30 tablet, Rfl: 3   carvedilol (COREG) 3.125 MG tablet, Take 1 tablet (3.125 mg total) by mouth 2 (two)  times daily with a meal., Disp: 60 tablet, Rfl: 1   clopidogrel (PLAVIX) 75 MG tablet, Take 1 tablet (75 mg total) by mouth daily., Disp: 30 tablet, Rfl: 1   empagliflozin (JARDIANCE) 10 MG TABS tablet, Take 1 tablet (10 mg total) by mouth daily., Disp: 30 tablet, Rfl: 1   ezetimibe (ZETIA) 10 MG tablet, Take 1 tablet (10 mg total) by mouth daily., Disp: 30 tablet, Rfl: 3   sacubitril-valsartan (ENTRESTO) 24-26 MG, Take 1 tablet by mouth 2 (two) times daily., Disp: 60 tablet, Rfl: 0   spironolactone (ALDACTONE) 25 MG tablet, Take 1 tablet (25 mg total) by mouth daily., Disp: 30 tablet, Rfl: 1   torsemide (DEMADEX) 20 MG tablet, Take 1 tablet (20 mg total) by mouth as needed., Disp: 30 tablet, Rfl: 1 No Known Allergies   Social History   Socioeconomic History   Marital status: Single    Spouse name: Not on file   Number of children: 3   Years of education: Not on file   Highest education level: 12th grade  Occupational History   Not on file  Tobacco Use   Smoking status: Some Days    Types: Cigars   Smokeless tobacco: Never  Vaping Use   Vaping Use: Never used  Substance and Sexual Activity   Alcohol use: Yes    Comment: social drinking   Drug use: Not Currently    Types: Marijuana, Cocaine    Comment: 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   Sexual  activity: Not Currently    Birth control/protection: Post-menopausal  Other Topics Concern   Not on file  Social History Narrative   Not on file   Social Determinants of Health   Financial Resource Strain: High Risk (12/14/2021)   Overall Financial Resource Strain (CARDIA)    Difficulty of Paying Living Expenses: Hard  Food Insecurity: No Food Insecurity (12/14/2021)   Hunger Vital Sign    Worried About Running Out of Food in the Last Year: Never true    Ran Out of Food in the Last Year: Never true  Transportation Needs: No Transportation Needs (12/14/2021)   PRAPARE - Hydrologist (Medical): No     Lack of Transportation (Non-Medical): No  Physical Activity: Inactive (02/21/2017)   Exercise Vital Sign    Days of Exercise per Week: 0 days    Minutes of Exercise per Session: 0 min  Stress: No Stress Concern Present (02/21/2017)   [place]    Feeling of Stress : Not at all  Social Connections: Somewhat Isolated (02/21/2017)   Social Connection and Isolation Panel [NHANES]    Frequency of Communication with Friends and Family: More than three times a week    Frequency of Social Gatherings with Friends and Family: Once a week    Attends Religious Services: 1 to 4 times per year    Active Member of [employer] or Organizations: No    Attends Archivist Meetings: Never    Marital Status: Never married  Intimate Partner Violence: Not At Risk (11/19/2021)   Humiliation, Afraid, Rape, and Kick questionnaire    Fear of Current or Ex-Partner: No    Emotionally Abused: No    Physically Abused: No    Sexually Abused: No    Physical Exam      Future Appointments  Date Time Provider [place]  02/02/2022  3:00 PM [facility] PA/NP [facility] None  05/09/2022  1:50 PM Charlott Rakes, MD [facility] None

## 2022-02-03 ENCOUNTER — Other Ambulatory Visit: Payer: Self-pay

## 2022-02-03 NOTE — Progress Notes (Signed)
Carelink Summary Report / Loop Recorder

## 2022-02-07 ENCOUNTER — Other Ambulatory Visit (HOSPITAL_COMMUNITY): Payer: Self-pay

## 2022-02-07 ENCOUNTER — Telehealth (HOSPITAL_COMMUNITY): Payer: Self-pay | Admitting: Licensed Clinical Social Worker

## 2022-02-07 NOTE — Telephone Encounter (Signed)
H&V Care Navigation CSW Progress Note  Clinical Social Worker received phone call from pt and dtr requesting help getting letter for return to work updated to appease HR.  CSW assisted in this and faxed back then.  HR rep then stated that official form needed to be completed before she could return to work- CSW assisted in completing form and had physician review and sign.   SDOH Screenings   Food Insecurity: No Food Insecurity (12/14/2021)  Housing: Low Risk  (11/19/2021)  Transportation Needs: No Transportation Needs (12/14/2021)  Utilities: Not At Risk (11/19/2021)  Depression (PHQ2-9): Low Risk  (12/23/2021)  Financial Resource Strain: High Risk (12/14/2021)  Physical Activity: Inactive (02/21/2017)  Social Connections: Somewhat Isolated (02/21/2017)  Stress: No Stress Concern Present (02/21/2017)  Tobacco Use: High Risk (12/27/2021)   Leslie Walker, Leslie Walker Clinic Desk#: [phone] Cell#: [phone]

## 2022-02-11 ENCOUNTER — Other Ambulatory Visit (HOSPITAL_COMMUNITY): Payer: Self-pay | Admitting: Emergency Medicine

## 2022-02-11 NOTE — Progress Notes (Unsigned)
Med rec only: Pt not home.  Spoke with her on the phone and she gave permission for me to stop by.  Meds reconciled x 2 weeks.  Her daughter advised me that her mom will be going back to work next week.  I will contact her Tuesday to schedule an in person visit.    [name], [place] 02/13/2022

## 2022-02-18 ENCOUNTER — Other Ambulatory Visit (HOSPITAL_COMMUNITY): Payer: Self-pay | Admitting: Emergency Medicine

## 2022-02-18 ENCOUNTER — Other Ambulatory Visit: Payer: Self-pay

## 2022-02-18 ENCOUNTER — Other Ambulatory Visit (HOSPITAL_COMMUNITY): Payer: Self-pay | Admitting: Physician Assistant

## 2022-02-18 DIAGNOSIS — I2510 Atherosclerotic heart disease of native coronary artery without angina pectoris: Secondary | ICD-10-CM

## 2022-02-18 NOTE — Progress Notes (Signed)
Paramedicine Encounter    Patient ID: Leslie Walker, female    DOB: [birth year], 57 y.o.   MRN: [id number]   Complaints***  Assessment***  Compliance with meds***  Pill box filled***  Refills needed***  Meds changes since last visit***    Social changes***   LMP  (LMP Unknown) Comment: Pt unsure why she no longer gets her period Weight yesterday-*** Last visit weight-***  ACTION: {Paramed Action:[phone]}  Leslie Walker, [place] 02/18/22  Patient Care Team: Elbert Ewings, FNP as PCP - General (Nurse Practitioner) Pixie Casino, MD as PCP - Cardiology (Cardiology)  Patient Active Problem List   Diagnosis Date Noted  . Coronary artery disease 11/20/2021  . Elevated troponin 11/19/2021  . Heart failure ([place]) 11/19/2021  . Acute on chronic systolic heart failure ([place]) 11/18/2021  . Chronic systolic heart failure ([place])   . Chest pain, precordial 10/09/2020  . Cocaine use 10/09/2020  . NSTEMI (non-ST elevated myocardial infarction) ([place]) 10/09/2020  . Polysubstance abuse ([place]) 10/09/2020  . Hyperlipidemia 10/09/2020  . Lung nodule 10/09/2020  . Tobacco dependence 10/09/2020  . Screening breast examination 09/04/2018  . Heroin dependence ([place]) 06/29/2018  . TIA (transient ischemic attack) 06/24/2018  . Nail fungus 04/20/2015  . Porokeratosis 04/20/2015  . History of ulceration 04/20/2015  . Foot ulcer ([place]) 02/17/2014  . Cellulitis of foot, right 02/17/2014  . Chronic pain syndrome 02/17/2014  . Essential hypertension 02/17/2014    Current Outpatient Medications:  .  atorvastatin (LIPITOR) 40 MG tablet, Take 1 tablet (40 mg total) by mouth daily at 6 PM., Disp: 30 tablet, Rfl: 3 .  carvedilol (COREG) 6.25 MG tablet, Take 1 tablet (6.25 mg total) by mouth 2 (two) times daily., Disp: 180 tablet, Rfl: 3 .  clopidogrel (PLAVIX) 75 MG tablet, Take 1 tablet (75 mg total) by mouth daily., Disp: 30 tablet, Rfl: 1 .  empagliflozin (JARDIANCE) 10  MG TABS tablet, Take 1 tablet (10 mg total) by mouth daily., Disp: 30 tablet, Rfl: 1 .  ezetimibe (ZETIA) 10 MG tablet, Take 1 tablet (10 mg total) by mouth daily., Disp: 30 tablet, Rfl: 3 .  sacubitril-valsartan (ENTRESTO) 24-26 MG, Take 1 tablet by mouth 2 (two) times daily., Disp: 60 tablet, Rfl: 0 .  spironolactone (ALDACTONE) 25 MG tablet, Take 1 tablet (25 mg total) by mouth daily., Disp: 30 tablet, Rfl: 1 No Known Allergies   Social History   Socioeconomic History  . Marital status: Single    Spouse name: Not on file  . Number of children: 3  . Years of education: Not on file  . Highest education level: 12th grade  Occupational History  . Not on file  Tobacco Use  . Smoking status: Some Days    Types: Cigars  . Smokeless tobacco: Never  Vaping Use  . Vaping Use: Never used  Substance and Sexual Activity  . Alcohol use: Yes    Comment: social drinking  . Drug use: Not Currently    Types: Marijuana, Cocaine    Comment: 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  . Sexual activity: Not Currently    Birth control/protection: Post-menopausal  Other Topics Concern  . Not on file  Social History Narrative  . Not on file   Social Determinants of Health   Financial Resource Strain: High Risk (12/14/2021)   Overall Financial Resource Strain (CARDIA)   . Difficulty of Paying Living Expenses: Hard  Food Insecurity: No Food Insecurity (12/14/2021)   Hunger Vital Sign   . Worried  About Running Out of Food in the Last Year: Never true   . Ran Out of Food in the Last Year: Never true  Transportation Needs: No Transportation Needs (12/14/2021)   PRAPARE - Transportation   . Lack of Transportation (Medical): No   . Lack of Transportation (Non-Medical): No  Physical Activity: Inactive (02/21/2017)   Exercise Vital Sign   . Days of Exercise per Week: 0 days   . Minutes of Exercise per Session: 0 min  Stress: No Stress Concern Present (02/21/2017)   Leslie Walker   . Feeling of Stress : Not at all  Social Connections: Somewhat Isolated (02/21/2017)   Social Connection and Isolation Panel [NHANES]   . Frequency of Communication with Friends and Family: More than three times a week   . Frequency of Social Gatherings with Friends and Family: Once a week   . Attends Religious Services: 1 to 4 times per year   . Active Member of Clubs or Organizations: No   . Attends Archivist Meetings: Never   . Marital Status: Never married  Intimate Partner Violence: Not At Risk (11/19/2021)   Humiliation, Afraid, Rape, and Kick questionnaire   . Fear of Current or Ex-Partner: No   . Emotionally Abused: No   . Physically Abused: No   . Sexually Abused: No    Physical Exam      Future Appointments  Date Time Provider Portal  04/04/2022  8:00 AM [facility] ECHO OP 1 [facility]  04/04/2022  9:20 AM Larey Dresser, MD [facility] None  05/09/2022  1:50 PM Charlott Rakes, MD [facility] None

## 2022-02-21 ENCOUNTER — Other Ambulatory Visit: Payer: Self-pay

## 2022-02-21 MED ORDER — CLOPIDOGREL BISULFATE 75 MG PO TABS
75.0000 mg | ORAL_TABLET | Freq: Every day | ORAL | 1 refills | Status: DC
  Filled 2022-02-21: qty 30, 30d supply, fill #0
  Filled 2022-03-23: qty 30, 30d supply, fill #1

## 2022-02-21 MED ORDER — SPIRONOLACTONE 25 MG PO TABS
25.0000 mg | ORAL_TABLET | Freq: Every day | ORAL | 1 refills | Status: DC
  Filled 2022-02-21: qty 30, 30d supply, fill #0
  Filled 2022-03-23: qty 30, 30d supply, fill #1

## 2022-02-23 ENCOUNTER — Other Ambulatory Visit (HOSPITAL_COMMUNITY): Payer: Self-pay | Admitting: Emergency Medicine

## 2022-02-23 ENCOUNTER — Other Ambulatory Visit: Payer: Self-pay

## 2022-02-23 NOTE — Progress Notes (Signed)
Paramedicine Encounter    Patient ID: Leslie Walker, female    DOB: 30-Oct-1965, 57 y.o.   MRN: [id number]   Complaints no complaints  Assessment No chest pain, SOB, no edema.  Lung sounds clear and equal  Compliance with meds 100%  Pill box filled x 2 weeks  Refills needed none  Meds changes since last visit none    Social changes None  LMP  (LMP Unknown) Comment: Pt unsure why she no longer gets her period Weight yesterday-not taken Last visit weight-134lb  Picked up refills from [employer] for Zetia, Clopidogrel, Arlyce Harman, Atorvastatin Leslie Walker reports to be feelin well today. Wednesday's are her day off.  She is very conscientious about taking her meds as prescribed and participates in the refilling of her pill box.  She has gone back to work at [employer] and says that she is doing well and glad to have something to help keep her busy.  We discussed that she needs to complete Medicaid application and she advised she would do so.  ACTION: Home visit completed  Skipper Cliche [phone] 02/23/22  Patient Care Team: Elbert Ewings, FNP as PCP - General (Nurse Practitioner) Pixie Casino, MD as PCP - Cardiology (Cardiology)  Patient Active Problem List   Diagnosis Date Noted   Coronary artery disease 11/20/2021   Elevated troponin 11/19/2021   Heart failure ([place]) 11/19/2021   Acute on chronic systolic heart failure ([place]) 94/85/4627   Chronic systolic heart failure (HCC)    Chest pain, precordial 10/09/2020   Cocaine use 10/09/2020   NSTEMI (non-ST elevated myocardial infarction) ([place]) 10/09/2020   Polysubstance abuse ([place]) 10/09/2020   Hyperlipidemia 10/09/2020   Lung nodule 10/09/2020   Tobacco dependence 10/09/2020   Screening breast examination 09/04/2018   Heroin dependence ([place]) 06/29/2018   TIA (transient ischemic attack) 06/24/2018   Nail fungus 04/20/2015   Porokeratosis 04/20/2015   History of ulceration 04/20/2015   Foot ulcer  ([place]) 02/17/2014   Cellulitis of foot, right 02/17/2014   Chronic pain syndrome 02/17/2014   Essential hypertension 02/17/2014    Current Outpatient Medications:    atorvastatin (LIPITOR) 40 MG tablet, Take 1 tablet (40 mg total) by mouth daily at 6 PM., Disp: 30 tablet, Rfl: 3   carvedilol (COREG) 6.25 MG tablet, Take 1 tablet (6.25 mg total) by mouth 2 (two) times daily., Disp: 180 tablet, Rfl: 3   clopidogrel (PLAVIX) 75 MG tablet, Take 1 tablet (75 mg total) by mouth daily., Disp: 30 tablet, Rfl: 1   empagliflozin (JARDIANCE) 10 MG TABS tablet, Take 1 tablet (10 mg total) by mouth daily., Disp: 30 tablet, Rfl: 1   ezetimibe (ZETIA) 10 MG tablet, Take 1 tablet (10 mg total) by mouth daily., Disp: 30 tablet, Rfl: 3   sacubitril-valsartan (ENTRESTO) 24-26 MG, Take 1 tablet by mouth 2 (two) times daily., Disp: 60 tablet, Rfl: 0   spironolactone (ALDACTONE) 25 MG tablet, Take 1 tablet (25 mg total) by mouth daily., Disp: 30 tablet, Rfl: 1 No Known Allergies   Social History   Socioeconomic History   Marital status: Single    Spouse name: Not on file   Number of children: 3   Years of education: Not on file   Highest education level: 12th grade  Occupational History   Not on file  Tobacco Use   Smoking status: Some Days    Types: Cigars   Smokeless tobacco: Never  Vaping Use   Vaping Use: Never used  Substance and Sexual  Activity   Alcohol use: Yes    Comment: social drinking   Drug use: Not Currently    Types: Marijuana, Cocaine    Comment: 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   Sexual activity: Not Currently    Birth control/protection: Post-menopausal  Other Topics Concern   Not on file  Social History Narrative   Not on file   Social Determinants of Health   Financial Resource Strain: High Risk (12/14/2021)   Overall Financial Resource Strain (CARDIA)    Difficulty of Paying Living Expenses: Hard  Food Insecurity: No Food Insecurity (12/14/2021)   Hunger Vital Sign     Worried About Running Out of Food in the Last Year: Never true    Ran Out of Food in the Last Year: Never true  Transportation Needs: No Transportation Needs (12/14/2021)   PRAPARE - Hydrologist (Medical): No    Lack of Transportation (Non-Medical): No  Physical Activity: Inactive (02/21/2017)   Exercise Vital Sign    Days of Exercise per Week: 0 days    Minutes of Exercise per Session: 0 min  Stress: No Stress Concern Present (02/21/2017)   [place]    Feeling of Stress : Not at all  Social Connections: Somewhat Isolated (02/21/2017)   Social Connection and Isolation Panel [NHANES]    Frequency of Communication with Friends and Family: More than three times a week    Frequency of Social Gatherings with Friends and Family: Once a week    Attends Religious Services: 1 to 4 times per year    Active Member of [employer] or Organizations: No    Attends Archivist Meetings: Never    Marital Status: Never married  Intimate Partner Violence: Not At Risk (11/19/2021)   Humiliation, Afraid, Rape, and Kick questionnaire    Fear of Current or Ex-Partner: No    Emotionally Abused: No    Physically Abused: No    Sexually Abused: No    Physical Exam      Future Appointments  Date Time Provider [place]  04/04/2022  8:00 AM MC ECHO OP 1 [facility]  04/04/2022  9:20 AM Larey Dresser, MD MC-[facility] None  05/09/2022  1:50 PM Charlott Rakes, MD CHW-[facility] None

## 2022-03-02 ENCOUNTER — Other Ambulatory Visit (HOSPITAL_COMMUNITY): Payer: Self-pay | Admitting: Emergency Medicine

## 2022-03-02 ENCOUNTER — Telehealth (HOSPITAL_COMMUNITY): Payer: Self-pay | Admitting: Licensed Clinical Social Worker

## 2022-03-02 NOTE — Telephone Encounter (Signed)
HF Paramedicine Team Based Care Meeting  HF MD- NA  HF NP - Amy Clegg NP-C   [facility] HF Paramedicine  Katie Lynch Heather Brayton Caves  Medications concerns? Understands how to call in refills- paramedic still filling the med box- needs to be educated and gain confidence  SDOH - has returned to work  Eligible for discharge? Think she will be eligible for DC soon after confirm she is able to do med box reliably- next clinic appt 3/18 will consider DC at that time pending status.  Jorge Ny, LCSW Clinical Social Worker [facility] Desk#: [phone] Cell#: [phone]

## 2022-03-03 NOTE — Progress Notes (Signed)
Pt called and let me know she would not be able to make visit today and requested I stop by the house "my daughter will be home."  Refilled pill box x 1 week.  Will revisit next Wednesday for full home visit.    [name], [place] 03/03/2022

## 2022-03-09 ENCOUNTER — Other Ambulatory Visit: Payer: Self-pay

## 2022-03-09 ENCOUNTER — Other Ambulatory Visit (HOSPITAL_COMMUNITY): Payer: Self-pay | Admitting: Emergency Medicine

## 2022-03-09 NOTE — Progress Notes (Signed)
Paramedicine Encounter    Patient ID: Leslie Walker, female    DOB: February 19, 1965, 57 y.o.   MRN: [id number]   Complaints NONE  Assessment No chest pain, SOB, no edema.   Lung sounds clear and equal throughout.  Denies recreational drug use.  Compliance with meds 100%  Pill box filled x 2 weeks  Refills needed none  Meds changes since last visit NONE    Social changes NONE   BP (!) 120/90 (BP Location: Right Arm, Patient Position: Sitting, Cuff Size: Normal)   Pulse 75   Resp 14   Wt 133 lb 9.6 oz (60.6 kg)   LMP  (LMP Unknown) Comment: Pt unsure why she no longer gets her period  SpO2 99%   BMI 21.56 kg/m  Weight yesterday-not taken Last visit weight-135lb  Discussed goal of preparing her to graduate in the near future.  She states she feels comfortable with same.   Will consider graduation after her next clinic visit 3/18.  ACTION: Home visit completed  Skipper Cliche [id number] 03/09/22  Patient Care Team: Elbert Ewings, FNP as PCP - General (Nurse Practitioner) Pixie Casino, MD as PCP - Cardiology (Cardiology)  Patient Active Problem List   Diagnosis Date Noted   Coronary artery disease 11/20/2021   Elevated troponin 11/19/2021   Heart failure ([place]) 11/19/2021   Acute on chronic systolic heart failure ([place]) [id number]   Chronic systolic heart failure (HCC)    Chest pain, precordial 10/09/2020   Cocaine use 10/09/2020   NSTEMI (non-ST elevated myocardial infarction) ([place]) 10/09/2020   Polysubstance abuse ([place]) 10/09/2020   Hyperlipidemia 10/09/2020   Lung nodule 10/09/2020   Tobacco dependence 10/09/2020   Screening breast examination 09/04/2018   Heroin dependence ([place]) 06/29/2018   TIA (transient ischemic attack) 06/24/2018   Nail fungus 04/20/2015   Porokeratosis 04/20/2015   History of ulceration 04/20/2015   Foot ulcer ([place]) 02/17/2014   Cellulitis of foot, right 02/17/2014   Chronic pain syndrome 02/17/2014   Essential  hypertension 02/17/2014    Current Outpatient Medications:    atorvastatin (LIPITOR) 40 MG tablet, Take 1 tablet (40 mg total) by mouth daily at 6 PM., Disp: 30 tablet, Rfl: 3   carvedilol (COREG) 6.25 MG tablet, Take 1 tablet (6.25 mg total) by mouth 2 (two) times daily., Disp: 180 tablet, Rfl: 3   clopidogrel (PLAVIX) 75 MG tablet, Take 1 tablet (75 mg total) by mouth daily., Disp: 30 tablet, Rfl: 1   empagliflozin (JARDIANCE) 10 MG TABS tablet, Take 1 tablet (10 mg total) by mouth daily., Disp: 30 tablet, Rfl: 1   ezetimibe (ZETIA) 10 MG tablet, Take 1 tablet (10 mg total) by mouth daily., Disp: 30 tablet, Rfl: 3   sacubitril-valsartan (ENTRESTO) 24-26 MG, Take 1 tablet by mouth 2 (two) times daily., Disp: 60 tablet, Rfl: 0   spironolactone (ALDACTONE) 25 MG tablet, Take 1 tablet (25 mg total) by mouth daily., Disp: 30 tablet, Rfl: 1 No Known Allergies   Social History   Socioeconomic History   Marital status: Single    Spouse name: Not on file   Number of children: 3   Years of education: Not on file   Highest education level: 12th grade  Occupational History   Not on file  Tobacco Use   Smoking status: Some Days    Types: Cigars   Smokeless tobacco: Never  Vaping Use   Vaping Use: Never used  Substance and Sexual Activity   Alcohol use: Yes  Comment: social drinking   Drug use: Not Currently    Types: Marijuana, Cocaine    Comment: 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   Sexual activity: Not Currently    Birth control/protection: Post-menopausal  Other Topics Concern   Not on file  Social History Narrative   Not on file   Social Determinants of Health   Financial Resource Strain: High Risk (12/14/2021)   Overall Financial Resource Strain (CARDIA)    Difficulty of Paying Living Expenses: Hard  Food Insecurity: No Food Insecurity (12/14/2021)   Hunger Vital Sign    Worried About Running Out of Food in the Last Year: Never true    Ran Out of Food in the Last Year:  Never true  Transportation Needs: No Transportation Needs (12/14/2021)   PRAPARE - Hydrologist (Medical): No    Lack of Transportation (Non-Medical): No  Physical Activity: Inactive (02/21/2017)   Exercise Vital Sign    Days of Exercise per Week: 0 days    Minutes of Exercise per Session: 0 min  Stress: No Stress Concern Present (02/21/2017)   [place]    Feeling of Stress : Not at all  Social Connections: Somewhat Isolated (02/21/2017)   Social Connection and Isolation Panel [NHANES]    Frequency of Communication with Friends and Family: More than three times a week    Frequency of Social Gatherings with Friends and Family: Once a week    Attends Religious Services: 1 to 4 times per year    Active Member of [employer] or Organizations: No    Attends Archivist Meetings: Never    Marital Status: Never married  Intimate Partner Violence: Not At Risk (11/19/2021)   Humiliation, Afraid, Rape, and Kick questionnaire    Fear of Current or Ex-Partner: No    Emotionally Abused: No    Physically Abused: No    Sexually Abused: No    Physical Exam      Future Appointments  Date Time Provider [place]  04/04/2022  8:00 AM MC ECHO OP 1 [facility]  04/04/2022  9:20 AM Larey Dresser, MD MC-[facility] None  05/09/2022  1:50 PM Charlott Rakes, MD CHW-CHWW None

## 2022-03-23 ENCOUNTER — Other Ambulatory Visit: Payer: Self-pay

## 2022-03-23 ENCOUNTER — Other Ambulatory Visit (HOSPITAL_COMMUNITY): Payer: Self-pay | Admitting: Emergency Medicine

## 2022-03-23 NOTE — Progress Notes (Signed)
Paramedicine Encounter    Patient ID: Leslie Walker, female    DOB: 1965/10/26, 57 y.o.   MRN: [id number]   Complaints***  Assessment***  Compliance with meds***  Pill box filled***  Refills needed***  Meds changes since last visit***    Social changes***   LMP  (LMP Unknown) Comment: Pt unsure why she no longer gets her period Weight yesterday-not taken Last visit weight-133lb  ACTION: {Paramed [phone]}  [name], [place] 03/23/22  Patient Care Team: Elbert Ewings, FNP as PCP - General (Nurse Practitioner) Pixie Casino, MD as PCP - Cardiology (Cardiology)  Patient Active Problem List   Diagnosis Date Noted  . Coronary artery disease 11/20/2021  . Elevated troponin 11/19/2021  . Heart failure ([place]) 11/19/2021  . Acute on chronic systolic heart failure ([place]) 11/18/2021  . Chronic systolic heart failure ([place])   . Chest pain, precordial 10/09/2020  . Cocaine use 10/09/2020  . NSTEMI (non-ST elevated myocardial infarction) ([place]) 10/09/2020  . Polysubstance abuse ([place]) 10/09/2020  . Hyperlipidemia 10/09/2020  . Lung nodule 10/09/2020  . Tobacco dependence 10/09/2020  . Screening breast examination 09/04/2018  . Heroin dependence ([place]) 06/29/2018  . TIA (transient ischemic attack) 06/24/2018  . Nail fungus 04/20/2015  . Porokeratosis 04/20/2015  . History of ulceration 04/20/2015  . Foot ulcer ([place]) 02/17/2014  . Cellulitis of foot, right 02/17/2014  . Chronic pain syndrome 02/17/2014  . Essential hypertension 02/17/2014    Current Outpatient Medications:  .  atorvastatin (LIPITOR) 40 MG tablet, Take 1 tablet (40 mg total) by mouth daily at 6 PM., Disp: 30 tablet, Rfl: 3 .  carvedilol (COREG) 6.25 MG tablet, Take 1 tablet (6.25 mg total) by mouth 2 (two) times daily., Disp: 180 tablet, Rfl: 3 .  clopidogrel (PLAVIX) 75 MG tablet, Take 1 tablet (75 mg total) by mouth daily., Disp: 30 tablet, Rfl: 1 .  empagliflozin  (JARDIANCE) 10 MG TABS tablet, Take 1 tablet (10 mg total) by mouth daily., Disp: 30 tablet, Rfl: 1 .  ezetimibe (ZETIA) 10 MG tablet, Take 1 tablet (10 mg total) by mouth daily., Disp: 30 tablet, Rfl: 3 .  sacubitril-valsartan (ENTRESTO) 24-26 MG, Take 1 tablet by mouth 2 (two) times daily., Disp: 60 tablet, Rfl: 0 .  spironolactone (ALDACTONE) 25 MG tablet, Take 1 tablet (25 mg total) by mouth daily., Disp: 30 tablet, Rfl: 1 No Known Allergies   Social History   Socioeconomic History  . Marital status: Single    Spouse name: Not on file  . Number of children: 3  . Years of education: Not on file  . Highest education level: 12th grade  Occupational History  . Not on file  Tobacco Use  . Smoking status: Some Days    Types: Cigars  . Smokeless tobacco: Never  Vaping Use  . Vaping Use: Never used  Substance and Sexual Activity  . Alcohol use: Yes    Comment: social drinking  . Drug use: Not Currently    Types: Marijuana, Cocaine    Comment: 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  . Sexual activity: Not Currently    Birth control/protection: Post-menopausal  Other Topics Concern  . Not on file  Social History Narrative  . Not on file   Social Determinants of Health   Financial Resource Strain: High Risk (12/14/2021)   Overall Financial Resource Strain (CARDIA)   . Difficulty of Paying Living Expenses: Hard  Food Insecurity: No Food Insecurity (12/14/2021)   Hunger Vital Sign   .  Worried About Charity fundraiser in the Last Year: Never true   . Ran Out of Food in the Last Year: Never true  Transportation Needs: No Transportation Needs (12/14/2021)   PRAPARE - Transportation   . Lack of Transportation (Medical): No   . Lack of Transportation (Non-Medical): No  Physical Activity: Inactive (02/21/2017)   Exercise Vital Sign   . Days of Exercise per Week: 0 days   . Minutes of Exercise per Session: 0 min  Stress: No Stress Concern Present (02/21/2017)   [place]   . Feeling of Stress : Not at all  Social Connections: Somewhat Isolated (02/21/2017)   Social Connection and Isolation Panel [NHANES]   . Frequency of Communication with Friends and Family: More than three times a week   . Frequency of Social Gatherings with Friends and Family: Once a week   . Attends Religious Services: 1 to 4 times per year   . Active Member of Clubs or Organizations: No   . Attends Archivist Meetings: Never   . Marital Status: Never married  Intimate Partner Violence: Not At Risk (11/19/2021)   Humiliation, Afraid, Rape, and Kick questionnaire   . Fear of Current or Ex-Partner: No   . Emotionally Abused: No   . Physically Abused: No   . Sexually Abused: No    Physical Exam      Future Appointments  Date Time Provider [place]  04/04/2022  8:00 AM MC ECHO OP 1 [facility]  04/04/2022  9:20 AM Larey Dresser, MD [facility] None  05/09/2022  1:50 PM Charlott Rakes, MD CHW-[facility] None

## 2022-03-24 ENCOUNTER — Other Ambulatory Visit (HOSPITAL_COMMUNITY): Payer: Self-pay | Admitting: Emergency Medicine

## 2022-03-24 ENCOUNTER — Other Ambulatory Visit: Payer: Self-pay

## 2022-03-24 NOTE — Progress Notes (Signed)
Delivered prescriptions for Spironolactone, Clopidogrel, Atorvastatin & Zetia.    [name], [place] 03/24/2022

## 2022-04-04 ENCOUNTER — Ambulatory Visit (HOSPITAL_BASED_OUTPATIENT_CLINIC_OR_DEPARTMENT_OTHER)
Admission: RE | Admit: 2022-04-04 | Discharge: 2022-04-04 | Disposition: A | Discharge status: Home / Self Care | Payer: Medicaid Other | Source: Ambulatory Visit | Attending: Cardiology | Admitting: Cardiology

## 2022-04-04 ENCOUNTER — Encounter (HOSPITAL_COMMUNITY): Payer: Self-pay | Admitting: Cardiology

## 2022-04-04 ENCOUNTER — Other Ambulatory Visit (HOSPITAL_COMMUNITY): Payer: Self-pay

## 2022-04-04 ENCOUNTER — Ambulatory Visit (HOSPITAL_COMMUNITY)
Admission: RE | Admit: 2022-04-04 | Discharge: 2022-04-04 | Disposition: A | Discharge status: Home / Self Care | Payer: Medicaid Other | Source: Ambulatory Visit | Attending: Cardiothoracic Surgery | Admitting: Cardiothoracic Surgery

## 2022-04-04 VITALS — BP 122/70 | HR 69 | Wt 134.0 lb

## 2022-04-04 DIAGNOSIS — I351 Nonrheumatic aortic (valve) insufficiency: Secondary | ICD-10-CM

## 2022-04-04 DIAGNOSIS — E785 Hyperlipidemia, unspecified: Secondary | ICD-10-CM | POA: Insufficient documentation

## 2022-04-04 DIAGNOSIS — I5022 Chronic systolic (congestive) heart failure: Secondary | ICD-10-CM

## 2022-04-04 DIAGNOSIS — I352 Nonrheumatic aortic (valve) stenosis with insufficiency: Secondary | ICD-10-CM | POA: Insufficient documentation

## 2022-04-04 DIAGNOSIS — F1911 Other psychoactive substance abuse, in remission: Secondary | ICD-10-CM | POA: Diagnosis not present

## 2022-04-04 DIAGNOSIS — Z955 Presence of coronary angioplasty implant and graft: Secondary | ICD-10-CM | POA: Insufficient documentation

## 2022-04-04 DIAGNOSIS — Z7902 Long term (current) use of antithrombotics/antiplatelets: Secondary | ICD-10-CM | POA: Diagnosis not present

## 2022-04-04 DIAGNOSIS — I110 Hypertensive heart disease with heart failure: Secondary | ICD-10-CM | POA: Insufficient documentation

## 2022-04-04 DIAGNOSIS — Z79899 Other long term (current) drug therapy: Secondary | ICD-10-CM | POA: Insufficient documentation

## 2022-04-04 DIAGNOSIS — Z8673 Personal history of transient ischemic attack (TIA), and cerebral infarction without residual deficits: Secondary | ICD-10-CM | POA: Insufficient documentation

## 2022-04-04 DIAGNOSIS — R9431 Abnormal electrocardiogram [ECG] [EKG]: Secondary | ICD-10-CM | POA: Diagnosis not present

## 2022-04-04 DIAGNOSIS — I252 Old myocardial infarction: Secondary | ICD-10-CM | POA: Diagnosis not present

## 2022-04-04 DIAGNOSIS — I428 Other cardiomyopathies: Secondary | ICD-10-CM | POA: Insufficient documentation

## 2022-04-04 DIAGNOSIS — I2510 Atherosclerotic heart disease of native coronary artery without angina pectoris: Secondary | ICD-10-CM | POA: Insufficient documentation

## 2022-04-04 DIAGNOSIS — F17290 Nicotine dependence, other tobacco product, uncomplicated: Secondary | ICD-10-CM | POA: Insufficient documentation

## 2022-04-04 DIAGNOSIS — Z95818 Presence of other cardiac implants and grafts: Secondary | ICD-10-CM | POA: Diagnosis not present

## 2022-04-04 LAB — LIPID PANEL
Cholesterol: 115 mg/dL (ref 0–200)
HDL: 44 mg/dL (ref >40)
LDL Cholesterol: 55 mg/dL (ref 0–99)
Total CHOL/HDL Ratio: 2.6 RATIO
Triglycerides: 78 mg/dL (ref <150)
VLDL: 16 mg/dL (ref 0–40)

## 2022-04-04 LAB — BASIC METABOLIC PANEL
Anion gap: 9 (ref 5–15)
BUN: 14 mg/dL (ref 6–20)
CO2: 24 mmol/L (ref 22–32)
Calcium: 8.8 mg/dL — ABNORMAL LOW (ref 8.9–10.3)
Chloride: 105 mmol/L (ref 98–111)
Creatinine, Ser: 1.27 mg/dL — ABNORMAL HIGH (ref 0.44–1.00)
GFR, Estimated: 49 mL/min — ABNORMAL LOW (ref >60)
Glucose, Bld: 120 mg/dL — ABNORMAL HIGH (ref 70–99)
Potassium: 4.1 mmol/L (ref 3.5–5.1)
Sodium: 138 mmol/L (ref 135–145)

## 2022-04-04 LAB — BRAIN NATRIURETIC PEPTIDE: B Natriuretic Peptide: 24.2 pg/mL (ref 0.0–100.0)

## 2022-04-04 LAB — ECHOCARDIOGRAM COMPLETE
AR max vel: 1.03 cm2
AV Area VTI: 1.04 cm2
AV Area mean vel: 1.03 cm2
AV Mean grad: 18 mmHg
AV Peak grad: 33.1 mmHg
AV Vena cont: 0.4 cm
Ao pk vel: 2.88 m/s
Area-P 1/2: 2.86 cm2
P 1/2 time: 786 msec
S' Lateral: 3.8 cm

## 2022-04-04 MED ORDER — ENTRESTO 49-51 MG PO TABS: 1.0000 | ORAL_TABLET | Freq: Two times a day (BID) | ORAL | 3 refills | Status: DC

## 2022-04-04 NOTE — Addendum Note (Signed)
Encounter addended by: Larey Dresser, MD on: 04/04/2022 10:00 PM  Actions taken: Clinical Note Signed, Level of Service modified

## 2022-04-04 NOTE — Progress Notes (Signed)
H&V Care Navigation CSW Progress Note  Clinical Social Worker consulted to touch base with pt regarding insurance.  Pt confirms she continues to be uninsured at this time.  Working but only part time so not eligible for insurance.  Has started application for Medicaid and is working on getting them required documents.  Was approved 100% for CAFA until 5/2 so should be ok with [facility] bills until that time.  No further needs or concerns at this time.  SDOH Screenings   Food Insecurity: No Food Insecurity (12/14/2021)  Housing: Low Risk  (11/19/2021)  Transportation Needs: No Transportation Needs (12/14/2021)  Utilities: Not At Risk (11/19/2021)  Depression (PHQ2-9): Low Risk  (12/23/2021)  Financial Resource Strain: High Risk (12/14/2021)  Physical Activity: Inactive (02/21/2017)  Social Connections: Somewhat Isolated (02/21/2017)  Stress: No Stress Concern Present (02/21/2017)  Tobacco Use: High Risk (04/04/2022)     Jorge Ny, [facility] Desk#: [phone] Cell#: [phone]

## 2022-04-04 NOTE — Patient Instructions (Signed)
INCREASE Entresto to 49/51 mg Twice daily  Labs done today, your results will be available in MyChart, we will contact you for abnormal readings.  Repeat blood work in 10 days.   You are scheduled for a TEE (Transesophageal Echocardiogram) on Tuesday, April 2 with Dr. Aundra Dubin.  Please arrive at the [facility] (Main Entrance A) at [facility]: [address] [place], [place] [address] at 9:30 AM.    DIET:  Nothing to eat or drink after midnight except a sip of water with medications (see medication instructions below)  MEDICATION INSTRUCTIONS:  HOLD your Jardiance and Spironolactone the morning of your procedure  FYI:  For your safety, and to allow [place] to monitor your vital signs accurately during the surgery/procedure we request: If you have artificial nails, gel coating, SNS etc, please have those removed prior to your surgery/procedure. Not having the nail coverings /polish removed may result in cancellation or delay of your surgery/procedure.  You must have a responsible person to drive you home and stay in the waiting area during your procedure. Failure to do so could result in cancellation.  Bring your insurance cards.  *Special Note: Every effort is made to have your procedure done on time. Occasionally there are emergencies that occur at the hospital that may cause delays. Please be patient if a delay does occur.   You have been referred to the lipid clinic. They will call you to arrange your appointment.   Your physician recommends that you schedule a follow-up appointment in: 3 months  If you have any questions or concerns before your next appointment please send [place] a message through [place] or call our office at [phone].    TO LEAVE A MESSAGE FOR THE NURSE SELECT OPTION 2, PLEASE LEAVE A MESSAGE INCLUDING: YOUR NAME DATE OF BIRTH CALL BACK NUMBER REASON FOR CALL**this is important as we prioritize the call backs  YOU WILL RECEIVE A CALL BACK THE SAME DAY AS  LONG AS YOU CALL BEFORE 4:00 PM  At the [facility], you and your health needs are our priority. As part of our continuing mission to provide you with exceptional heart care, we have created designated Provider Care Teams. These Care Teams include your primary Cardiologist (physician) and Advanced Practice Providers (APPs- Physician Assistants and Nurse Practitioners) who all work together to provide you with the care you need, when you need it.   You may see any of the following providers on your designated Care Team at your next follow up: Dr Glori Bickers Dr Loralie Champagne Dr. Roxana Hires, NP Lyda Jester, [place] [facility] [place], [place] Forestine Na, NP Audry Riles, PharmD   Please be sure to bring in all your medications bottles to every appointment.    Thank you for choosing [facility]

## 2022-04-04 NOTE — H&P (View-Only) (Signed)
Advanced Heart Failure Team Note      PCP: Anthony Steele, NP HF Cardiologist: Dr. Teshawn Moan  HPI: Leslie Walker is a 56 y.o. with a history of HTN, hyperlipidemia, stroke cypotogenic 2020/LINQ placement, CAD, polysubstance abuse heroin/cocaine abuse, and chronic HFrEF.    Admitted 09/2020 with NSTEMI. CT negative for PE but did show multivessel coronary calcium. Echo showed EF 45-50%, RV normal, AV moderate aortic valve stenosis. Had Cath with overlapping DES to mid RCA, 70-80% left circumflex, left main patent, mid LAD 55%. Plan for DAPT with Aspirin + Brillinta x12 months.     Admitted 11/23 with CHF. 3 months PTA she had run out of all medications. Says she didn't call for refills.   Last used cocaine 7 days prior to admission and heroin about 2 weeks prior. She was diuresed with IV lasix and GDMT titrated. She was enrolled in paramedicine program. Echo with EF 20-25%, RV okay, moderate to severe AI with possible mobile mass. TEE showed EF 20%, mildly reduced RV, bicuspid aortic valve with moderate to severe AI.  R/LHC in 11/23 showed 75% mid LCx stenosis, elevated PCWP and LVEDP but normal RV pressure, moderate pulmonary venous hypertension, CI low by thermo but preserved by Fick.  cMRI in 11/23 showed LVEF 18%, RVEF 48%, bicuspid aortic valve with severe AI, mid-wall LGE in inferolateral wall could be due to prior myocarditis but patient has CAD in LCx chronically.  Structural heart team reviewed her studies, not felt to be a candidate for TAVR.  Echo was done today and reviewed, EF 50-55%, normal RV, bicuspid aortic valve with moderate AS and probably moderate AI.    Patient returns for followup of CHF.  She is smoking about 1 cigar/day but is not using any drugs at this point. Weight down 4 lbs.  She denies exertional dyspnea or chest pain.  No orthopnea/PND.  No lightheadedness.  Generally feels like she is doing well.    ECG (personally reviewed): NSR, LAE  Labs (11/23): BNP 68, LDL 116,  Lp(a) 295 Labs (1/24): K 4.2, creatinine 1.08  PMH: 1. Hyperlipidemia 2. CVA: 2020, cryptogenic.  LINQ monitor placed.  3. Polysubstance abuse: Cocaine, heroin.  4. CAD: NSTEMI 9/22 with DES to mid RCA.  - LHC (11/23): 75% mLCx, 50% pLAD.  5. Bicuspid aortic valve disorder:  - CTA chest 9/22 with no thoracic aortic aneurysm.  - TEE (11/23): Moderate-severe AI - Echo (3/24): Moderate AS with mean gradient 18/AVA 1.04 cm^2; moderate AI.  6. Chronic systolic CHF: Nonischemic cardiomyopathy.  - RHC (11/23): mean RA 6, PA 51/29 mean 39, mean PCWP 32, CI 2.4 Fick with PVR 1.7 WU - cMRI (11/23): Moderate LV dilation with EF 18%, RV EF 48%, bicuspid aortic valve with severe AI, non-coronary LGE pattern with mid-wall LGE in the inferolateral wall (?prior myocarditis) though patient does have CAD in LCx.   - Echo (3/24): EF 50-55%, normal RV, bicuspid aortic valve with moderate AS and probably moderate AI.   ROS: All systems negative except as listed in HPI, PMH and Problem List.  Social History   Socioeconomic History   Marital status: Single    Spouse name: Not on file   Number of children: 3   Years of education: Not on file   Highest education level: 12th grade  Occupational History   Not on file  Tobacco Use   Smoking status: Some Days    Types: Cigars   Smokeless tobacco: Never  Vaping Use   Vaping Use:   Never used  Substance and Sexual Activity   Alcohol use: Yes    Comment: social drinking   Drug use: Not Currently    Types: Marijuana, Cocaine    Comment: 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   Sexual activity: Not Currently    Birth control/protection: Post-menopausal  Other Topics Concern   Not on file  Social History Narrative   Not on file   Social Determinants of Health   Financial Resource Strain: High Risk (12/14/2021)   Overall Financial Resource Strain (CARDIA)    Difficulty of Paying Living Expenses: Hard  Food Insecurity: No Food Insecurity (12/14/2021)    Hunger Vital Sign    Worried About Running Out of Food in the Last Year: Never true    Ran Out of Food in the Last Year: Never true  Transportation Needs: No Transportation Needs (12/14/2021)   PRAPARE - Transportation    Lack of Transportation (Medical): No    Lack of Transportation (Non-Medical): No  Physical Activity: Inactive (02/21/2017)   Exercise Vital Sign    Days of Exercise per Week: 0 days    Minutes of Exercise per Session: 0 min  Stress: No Stress Concern Present (02/21/2017)   [employer] - Occupational Stress Questionnaire    Feeling of Stress : Not at all  Social Connections: Somewhat Isolated (02/21/2017)   Social Connection and Isolation Panel [NHANES]    Frequency of Communication with Friends and Family: More than three times a week    Frequency of Social Gatherings with Friends and Family: Once a week    Attends Religious Services: 1 to 4 times per year    Active Member of Clubs or Organizations: No    Attends Club or Organization Meetings: Never    Marital Status: Never married  Intimate Partner Violence: Not At Risk (11/19/2021)   Humiliation, Afraid, Rape, and Kick questionnaire    Fear of Current or Ex-Partner: No    Emotionally Abused: No    Physically Abused: No    Sexually Abused: No    FH:  Family History  Problem Relation Age of Onset   Hypertension Mother    Hypertension Father    Hypertension Brother    Stroke Sister     Past Medical History:  Diagnosis Date   Chronic systolic heart failure (HCC)    Cocaine abuse (HCC)    Dyslipidemia    Hypertension    Noncompliance w/medication treatment due to intermit use of medication    Stroke (HCC) 2021   no deficits    Current Outpatient Medications  Medication Sig Dispense Refill   atorvastatin (LIPITOR) 40 MG tablet Take 1 tablet (40 mg total) by mouth daily at 6 PM. 30 tablet 3   carvedilol (COREG) 6.25 MG tablet Take 1 tablet (6.25 mg total) by mouth 2 (two) times  daily. 180 tablet 3   clopidogrel (PLAVIX) 75 MG tablet Take 1 tablet (75 mg total) by mouth daily. 30 tablet 1   empagliflozin (JARDIANCE) 10 MG TABS tablet Take 1 tablet (10 mg total) by mouth daily. 30 tablet 1   ezetimibe (ZETIA) 10 MG tablet Take 1 tablet (10 mg total) by mouth daily. 30 tablet 3   sacubitril-valsartan (ENTRESTO) 49-51 MG Take 1 tablet by mouth 2 (two) times daily. 180 tablet 3   spironolactone (ALDACTONE) 25 MG tablet Take 1 tablet (25 mg total) by mouth daily. 30 tablet 1   No current facility-administered medications for this encounter.      Vitals:   04/04/22 0921  BP: 122/70  Pulse: 69  SpO2: 100%  Weight: 60.8 kg (134 lb)   Wt Readings from Last 3 Encounters:  04/04/22 60.8 kg (134 lb)  03/23/22 60.3 kg (133 lb)  03/09/22 60.6 kg (133 lb 9.6 oz)   PHYSICAL EXAM: General: NAD Neck: No JVD, no thyromegaly or thyroid nodule.  Lungs: Clear to auscultation bilaterally with normal respiratory effort. CV: Nondisplaced PMI.  Heart regular S1/S2, no S3/S4, 2/6 SEM RUSB with clear S2, soft diastolic murmur.  No peripheral edema.  No carotid bruit.  Normal pedal pulses.  Abdomen: Soft, nontender, no hepatosplenomegaly, no distention.  Skin: Intact without lesions or rashes.  Neurologic: Alert and oriented x 3.  Psych: Normal affect. Extremities: No clubbing or cyanosis.  HEENT: Normal.   ASSESSMENT & PLAN:   1. Chronic systolic CHF: Nonischemic CMP.  Echo 11/23 with EF 20-25%, global hypokinesis, moderate LV dilation, normal RV, mod-severe AI.  RHC showed significantly elevated PCWP and LVEDP but normal RA pressure; CI 2.4 Fick/2.0 thermo.  Cause of cardiomyopathy uncertain, CAD does not appear to have progressed (75% mLCx stenosis on 11/23 cath).  Possibly some component of substance abuse (cocaine) though by cardiac MRI in 11/23, prior myocarditis is also a concern (non-coronary LGE noted).  LV EF 18% on cMRI with relatively preserved RV function.  Aortic  insufficiency also likely plays a role (moderate to severe on TEE in 11/23).  Repeat echo today showed improvement with EF 50-55%, normal RV, bicuspid aortic valve with moderate AS and moderate AI.  Improvement in EF suggests a role for cocaine in her cardiomyopathy (she has quit using drugs).  She is not volume overloaded on exam, NYHA class I-II.  - Continue Coreg 6.25 mg bid.    - Jardiance 10 mg daily - Increase Entresto to 49/51 bid, BMET today and BMET in 10 days.  - Spironolactone 25 mg daily.  2. CAD: DES to RCA in 9/22.  Cath in 11/23 with patent RCA stent, 50% proximal LAD, 75% mid LCx.  The LCx stenosis appeared similar to the prior cath. No chest pain. Lp(a) markedly elevated with age-advanced CAD.  - Continue Plavix 75 daily.  - Continue atorvastatin, check lipids today.  - With elevated Lp(a), will need to be aggressive with lipid management and will refer today to lipid clinic for consideration of PCSK9 inhibitor.  3. Aortic valve disorder: Moderate-severe AI on 11/23 echo and TEE. TEE showed bicuspid aortic valve with fusion of right and noncoronary cusps.  Moderate-severe AI by TEE.  By cardiac MRI, aortic valve regurgitant fraction was 38% which suggests severe AI.  Echo today showed probably moderate AI and moderate AS.  - Not a candidate for TAVR, was evaluated by structural heart team.  - With improvement in LV function, she would be SAVR candidate if aortic insufficiency remains severe.  I will arrange for TEE to reassess aortic valve, if AI appears severe will refer for surgical evaluation.  I discussed risks/benefits and patient agrees to procedure.  - Recommended screening in 1st degree relatives for bicuspid aortic valve 4. Polysubstance abuse: Cocaine/heroin.  She reports recent abstinence and EF has improved.  5. H/o CVA: She is on Plavix.  6. Smoking: Smokes 1 cigar/day. I strongly encouraged her to quit totally.   Followup in 3 months with APP.   Leslie Walker 04/04/2022  

## 2022-04-04 NOTE — Progress Notes (Addendum)
Advanced Heart Failure Team Note      PCP: Elbert Ewings, NP HF Cardiologist: Dr. Aundra Dubin  HPI: Leslie Walker is a 57 y.o. with a history of HTN, hyperlipidemia, stroke cypotogenic 2020/LINQ placement, CAD, polysubstance abuse heroin/cocaine abuse, and chronic HFrEF.    Admitted 09/2020 with NSTEMI. CT negative for PE but did show multivessel coronary calcium. Echo showed EF 45-50%, RV normal, AV moderate aortic valve stenosis. Had Cath with overlapping DES to mid RCA, 70-80% left circumflex, left main patent, mid LAD 55%. Plan for DAPT with Aspirin + Brillinta x12 months.     Admitted 11/23 with CHF. 3 months PTA she had run out of all medications. Says she didn't call for refills.   Last used cocaine 7 days prior to admission and heroin about 2 weeks prior. She was diuresed with IV lasix and GDMT titrated. She was enrolled in paramedicine program. Echo with EF 20-25%, RV okay, moderate to severe AI with possible mobile mass. TEE showed EF 20%, mildly reduced RV, bicuspid aortic valve with moderate to severe AI.  R/LHC in 11/23 showed 75% mid LCx stenosis, elevated PCWP and LVEDP but normal RV pressure, moderate pulmonary venous hypertension, CI low by thermo but preserved by Fick.  cMRI in 11/23 showed LVEF 18%, RVEF 48%, bicuspid aortic valve with severe AI, mid-wall LGE in inferolateral wall could be due to prior myocarditis but patient has CAD in LCx chronically.  Structural heart team reviewed her studies, not felt to be a candidate for TAVR.  Echo was done today and reviewed, EF 50-55%, normal RV, bicuspid aortic valve with moderate AS and probably moderate AI.    Patient returns for followup of CHF.  She is smoking about 1 cigar/day but is not using any drugs at this point. Weight down 4 lbs.  She denies exertional dyspnea or chest pain.  No orthopnea/PND.  No lightheadedness.  Generally feels like she is doing well.    ECG (personally reviewed): NSR, LAE  Labs (11/23): BNP 68, LDL 116,  Lp(a) 295 Labs (1/24): K 4.2, creatinine 1.08  PMH: 1. Hyperlipidemia 2. CVA: 2020, cryptogenic.  LINQ monitor placed.  3. Polysubstance abuse: Cocaine, heroin.  4. CAD: NSTEMI 9/22 with DES to mid RCA.  - LHC (11/23): 75% mLCx, 50% pLAD.  5. Bicuspid aortic valve disorder:  - CTA chest 9/22 with no thoracic aortic aneurysm.  - TEE (11/23): Moderate-severe AI - Echo (3/24): Moderate AS with mean gradient 18/AVA 1.04 cm^2; moderate AI.  6. Chronic systolic CHF: Nonischemic cardiomyopathy.  - RHC (11/23): mean RA 6, PA 51/29 mean 39, mean PCWP 32, CI 2.4 Fick with PVR 1.7 WU - cMRI (11/23): Moderate LV dilation with EF 18%, RV EF 48%, bicuspid aortic valve with severe AI, non-coronary LGE pattern with mid-wall LGE in the inferolateral wall (?prior myocarditis) though patient does have CAD in LCx.   - Echo (3/24): EF 50-55%, normal RV, bicuspid aortic valve with moderate AS and probably moderate AI.   ROS: All systems negative except as listed in HPI, PMH and Problem List.  Social History   Socioeconomic History   Marital status: Single    Spouse name: Not on file   Number of children: 3   Years of education: Not on file   Highest education level: 12th grade  Occupational History   Not on file  Tobacco Use   Smoking status: Some Days    Types: Cigars   Smokeless tobacco: Never  Vaping Use   Vaping Use:  Never used  Substance and Sexual Activity   Alcohol use: Yes    Comment: social drinking   Drug use: Not Currently    Types: Marijuana, Cocaine    Comment: 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   Sexual activity: Not Currently    Birth control/protection: Post-menopausal  Other Topics Concern   Not on file  Social History Narrative   Not on file   Social Determinants of Health   Financial Resource Strain: High Risk (12/14/2021)   Overall Financial Resource Strain (CARDIA)    Difficulty of Paying Living Expenses: Hard  Food Insecurity: No Food Insecurity (12/14/2021)    Hunger Vital Sign    Worried About Running Out of Food in the Last Year: Never true    Ran Out of Food in the Last Year: Never true  Transportation Needs: No Transportation Needs (12/14/2021)   PRAPARE - Hydrologist (Medical): No    Lack of Transportation (Non-Medical): No  Physical Activity: Inactive (02/21/2017)   Exercise Vital Sign    Days of Exercise per Week: 0 days    Minutes of Exercise per Session: 0 min  Stress: No Stress Concern Present (02/21/2017)   [place]    Feeling of Stress : Not at all  Social Connections: Somewhat Isolated (02/21/2017)   Social Connection and Isolation Panel [NHANES]    Frequency of Communication with Friends and Family: More than three times a week    Frequency of Social Gatherings with Friends and Family: Once a week    Attends Religious Services: 1 to 4 times per year    Active Member of [employer] or Organizations: No    Attends Archivist Meetings: Never    Marital Status: Never married  Intimate Partner Violence: Not At Risk (11/19/2021)   Humiliation, Afraid, Rape, and Kick questionnaire    Fear of Current or Ex-Partner: No    Emotionally Abused: No    Physically Abused: No    Sexually Abused: No    FH:  Family History  Problem Relation Age of Onset   Hypertension Mother    Hypertension Father    Hypertension Brother    Stroke Sister     Past Medical History:  Diagnosis Date   Chronic systolic heart failure ([place])    Cocaine abuse ([place])    Dyslipidemia    Hypertension    Noncompliance w/medication treatment due to intermit use of medication    Stroke ([place]) 2021   no deficits    Current Outpatient Medications  Medication Sig Dispense Refill   atorvastatin (LIPITOR) 40 MG tablet Take 1 tablet (40 mg total) by mouth daily at 6 PM. 30 tablet 3   carvedilol (COREG) 6.25 MG tablet Take 1 tablet (6.25 mg total) by mouth 2 (two) times  daily. 180 tablet 3   clopidogrel (PLAVIX) 75 MG tablet Take 1 tablet (75 mg total) by mouth daily. 30 tablet 1   empagliflozin (JARDIANCE) 10 MG TABS tablet Take 1 tablet (10 mg total) by mouth daily. 30 tablet 1   ezetimibe (ZETIA) 10 MG tablet Take 1 tablet (10 mg total) by mouth daily. 30 tablet 3   sacubitril-valsartan (ENTRESTO) 49-51 MG Take 1 tablet by mouth 2 (two) times daily. 180 tablet 3   spironolactone (ALDACTONE) 25 MG tablet Take 1 tablet (25 mg total) by mouth daily. 30 tablet 1   No current facility-administered medications for this encounter.  Vitals:   04/04/22 0921  BP: 122/70  Pulse: 69  SpO2: 100%  Weight: 60.8 kg (134 lb)   Wt Readings from Last 3 Encounters:  04/04/22 60.8 kg (134 lb)  03/23/22 60.3 kg (133 lb)  03/09/22 60.6 kg (133 lb 9.6 oz)   PHYSICAL EXAM: General: NAD Neck: No JVD, no thyromegaly or thyroid nodule.  Lungs: Clear to auscultation bilaterally with normal respiratory effort. CV: Nondisplaced PMI.  Heart regular S1/S2, no S3/S4, 2/6 SEM RUSB with clear S2, soft diastolic murmur.  No peripheral edema.  No carotid bruit.  Normal pedal pulses.  Abdomen: Soft, nontender, no hepatosplenomegaly, no distention.  Skin: Intact without lesions or rashes.  Neurologic: Alert and oriented x 3.  Psych: Normal affect. Extremities: No clubbing or cyanosis.  HEENT: Normal.   ASSESSMENT & PLAN:   1. Chronic systolic CHF: Nonischemic CMP.  Echo 11/23 with EF 20-25%, global hypokinesis, moderate LV dilation, normal RV, mod-severe AI.  RHC showed significantly elevated PCWP and LVEDP but normal RA pressure; CI 2.4 Fick/2.0 thermo.  Cause of cardiomyopathy uncertain, CAD does not appear to have progressed (75% mLCx stenosis on 11/23 cath).  Possibly some component of substance abuse (cocaine) though by cardiac MRI in 11/23, prior myocarditis is also a concern (non-coronary LGE noted).  LV EF 18% on cMRI with relatively preserved RV function.  Aortic  insufficiency also likely plays a role (moderate to severe on TEE in 11/23).  Repeat echo today showed improvement with EF 50-55%, normal RV, bicuspid aortic valve with moderate AS and moderate AI.  Improvement in EF suggests a role for cocaine in her cardiomyopathy (she has quit using drugs).  She is not volume overloaded on exam, NYHA class I-II.  - Continue Coreg 6.25 mg bid.    - Jardiance 10 mg daily - Increase Entresto to 49/51 bid, BMET today and BMET in 10 days.  - Spironolactone 25 mg daily.  2. CAD: DES to RCA in 9/22.  Cath in 11/23 with patent RCA stent, 50% proximal LAD, 75% mid LCx.  The LCx stenosis appeared similar to the prior cath. No chest pain. Lp(a) markedly elevated with age-advanced CAD.  - Continue Plavix 75 daily.  - Continue atorvastatin, check lipids today.  - With elevated Lp(a), will need to be aggressive with lipid management and will refer today to lipid clinic for consideration of PCSK9 inhibitor.  3. Aortic valve disorder: Moderate-severe AI on 11/23 echo and TEE. TEE showed bicuspid aortic valve with fusion of right and noncoronary cusps.  Moderate-severe AI by TEE.  By cardiac MRI, aortic valve regurgitant fraction was 38% which suggests severe AI.  Echo today showed probably moderate AI and moderate AS.  - Not a candidate for TAVR, was evaluated by structural heart team.  - With improvement in LV function, she would be SAVR candidate if aortic insufficiency remains severe.  I will arrange for TEE to reassess aortic valve, if AI appears severe will refer for surgical evaluation.  I discussed risks/benefits and patient agrees to procedure.  - Recommended screening in 1st degree relatives for bicuspid aortic valve 4. Polysubstance abuse: Cocaine/heroin.  She reports recent abstinence and EF has improved.  5. H/o CVA: She is on Plavix.  6. Smoking: Smokes 1 cigar/day. I strongly encouraged her to quit totally.   Followup in 3 months with APP.   Loralie Champagne 04/04/2022

## 2022-04-06 ENCOUNTER — Other Ambulatory Visit (HOSPITAL_COMMUNITY): Payer: Self-pay | Admitting: Emergency Medicine

## 2022-04-06 ENCOUNTER — Other Ambulatory Visit: Payer: Self-pay

## 2022-04-06 NOTE — Progress Notes (Signed)
Paramedicine Encounter    Patient ID: Leslie Walker, female    DOB: 11/30/1965, 57 y.o.   MRN: [id number]   Complaints NONE  Assessment A&O x 4, no chest pain, no SOB.  Lung sounds clear throughout, no edema noted.  Compliance with meds missed 1 noon Hydralazine.  Pill box filled x 1 week  Refills needed Entresto & Atorvastatin  Meds changes since last visit:  Increased Entresto to 49-51    Social changes NONE   BP (!) 160/80 (BP Location: Left Arm, Patient Position: Sitting)   Pulse 70   Resp 16   Wt 133 lb 6.4 oz (60.5 kg)   LMP  (LMP Unknown) Comment: Pt unsure why she no longer gets her period  SpO2 99%   BMI 21.53 kg/m  Weight yesterday-not taken Last visit weight-133lb [id number]   Leslie Walker filled her pill box x 2 weeks on my arrival and asked me to review.  Everything was correct except her Entresto dose needed to be increased.  Added an extra 24-26 BID to equal 49-51mg . Entresto. Blood pressure was up during visit but she states she had just gotten out of bed and had not yet taken her morning meds.  ACTION: Home visit completed  Skipper Cliche [id number] 04/06/22  Patient Care Team: Patient, No Pcp Per as PCP - General ([place]) Pixie Casino, MD as PCP - Cardiology (Cardiology)  Patient Active Problem List   Diagnosis Date Noted   Coronary artery disease 11/20/2021   Elevated troponin 11/19/2021   Heart failure ([place]) 11/19/2021   Acute on chronic systolic heart failure ([place]) [id number]   Chronic systolic heart failure ([facility])    Chest pain, precordial 10/09/2020   Cocaine use 10/09/2020   NSTEMI (non-ST elevated myocardial infarction) ([place]) 10/09/2020   Polysubstance abuse ([place]) 10/09/2020   Hyperlipidemia 10/09/2020   Lung nodule 10/09/2020   Tobacco dependence 10/09/2020   Screening breast examination 09/04/2018   Heroin dependence ([place]) 06/29/2018   TIA (transient ischemic attack) 06/24/2018   Nail fungus 04/20/2015    Porokeratosis 04/20/2015   History of ulceration 04/20/2015   Foot ulcer ([employer]) 02/17/2014   Cellulitis of foot, right 02/17/2014   Chronic pain syndrome 02/17/2014   Essential hypertension 02/17/2014    Current Outpatient Medications:    atorvastatin (LIPITOR) 40 MG tablet, Take 1 tablet (40 mg total) by mouth daily at 6 PM., Disp: 30 tablet, Rfl: 3   carvedilol (COREG) 6.25 MG tablet, Take 1 tablet (6.25 mg total) by mouth 2 (two) times daily., Disp: 180 tablet, Rfl: 3   clopidogrel (PLAVIX) 75 MG tablet, Take 1 tablet (75 mg total) by mouth daily., Disp: 30 tablet, Rfl: 1   empagliflozin (JARDIANCE) 10 MG TABS tablet, Take 1 tablet (10 mg total) by mouth daily., Disp: 30 tablet, Rfl: 1   ezetimibe (ZETIA) 10 MG tablet, Take 1 tablet (10 mg total) by mouth daily., Disp: 30 tablet, Rfl: 3   sacubitril-valsartan (ENTRESTO) 49-51 MG, Take 1 tablet by mouth 2 (two) times daily., Disp: 180 tablet, Rfl: 3   spironolactone (ALDACTONE) 25 MG tablet, Take 1 tablet (25 mg total) by mouth daily., Disp: 30 tablet, Rfl: 1 No Known Allergies   Social History   Socioeconomic History   Marital status: Single    Spouse name: Not on file   Number of children: 3   Years of education: Not on file   Highest education level: 12th grade  Occupational History   Not on file  Tobacco  Use   Smoking status: Some Days    Types: Cigars   Smokeless tobacco: Never  Vaping Use   Vaping Use: Never used  Substance and Sexual Activity   Alcohol use: Yes    Comment: social drinking   Drug use: Not Currently    Types: Marijuana, Cocaine    Comment: 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   Sexual activity: Not Currently    Birth control/protection: Post-menopausal  Other Topics Concern   Not on file  Social History Narrative   Not on file   Social Determinants of Health   Financial Resource Strain: High Risk (12/14/2021)   Overall Financial Resource Strain (CARDIA)    Difficulty of Paying Living Expenses:  Hard  Food Insecurity: No Food Insecurity (12/14/2021)   Hunger Vital Sign    Worried About Running Out of Food in the Last Year: Never true    Ran Out of Food in the Last Year: Never true  Transportation Needs: No Transportation Needs (12/14/2021)   PRAPARE - Hydrologist (Medical): No    Lack of Transportation (Non-Medical): No  Physical Activity: Inactive (02/21/2017)   Exercise Vital Sign    Days of Exercise per Week: 0 days    Minutes of Exercise per Session: 0 min  Stress: No Stress Concern Present (02/21/2017)   [place]    Feeling of Stress : Not at all  Social Connections: Somewhat Isolated (02/21/2017)   Social Connection and Isolation Panel [NHANES]    Frequency of Communication with Friends and Family: More than three times a week    Frequency of Social Gatherings with Friends and Family: Once a week    Attends Religious Services: 1 to 4 times per year    Active Member of [employer] or Organizations: No    Attends Archivist Meetings: Never    Marital Status: Never married  Intimate Partner Violence: Not At Risk (11/19/2021)   Humiliation, Afraid, Rape, and Kick questionnaire    Fear of Current or Ex-Partner: No    Emotionally Abused: No    Physically Abused: No    Sexually Abused: No    Physical Exam      Future Appointments  Date Time Provider [place]  04/14/2022 11:00 AM [facility] None  05/09/2022  1:50 PM Charlott Rakes, MD [facility] None  07/05/2022  9:00 AM [facility] PA/NP [facility] None

## 2022-04-13 ENCOUNTER — Telehealth (HOSPITAL_COMMUNITY): Payer: Self-pay

## 2022-04-13 NOTE — Telephone Encounter (Signed)
Reached out to pt to let her know dede is out today and to see if she needed anything.  She is good for now, she has pill box filled for another week. Advised her to keep this number if she needs something before dede reaches back out.   Marylouise Stacks, [place] 04/13/2022

## 2022-04-14 ENCOUNTER — Ambulatory Visit (HOSPITAL_COMMUNITY)
Admission: RE | Admit: 2022-04-14 | Discharge: 2022-04-14 | Disposition: A | Discharge status: Home / Self Care | Payer: Medicaid Other | Source: Ambulatory Visit | Attending: Internal Medicine | Admitting: Internal Medicine

## 2022-04-14 DIAGNOSIS — I5022 Chronic systolic (congestive) heart failure: Secondary | ICD-10-CM | POA: Diagnosis present

## 2022-04-14 LAB — BASIC METABOLIC PANEL
Anion gap: 11 (ref 5–15)
BUN: 12 mg/dL (ref 6–20)
CO2: 24 mmol/L (ref 22–32)
Calcium: 9.2 mg/dL (ref 8.9–10.3)
Chloride: 103 mmol/L (ref 98–111)
Creatinine, Ser: 1.03 mg/dL — ABNORMAL HIGH (ref 0.44–1.00)
GFR, Estimated: >60 mL/min (ref >60)
Glucose, Bld: 112 mg/dL — ABNORMAL HIGH (ref 70–99)
Potassium: 4.3 mmol/L (ref 3.5–5.1)
Sodium: 138 mmol/L (ref 135–145)

## 2022-04-14 NOTE — Addendum Note (Signed)
Encounter addended by: Shonna Chock, CMA on: [id number] 11:46 AM  Actions taken: Letter saved

## 2022-04-15 ENCOUNTER — Encounter: Payer: Self-pay | Admitting: *Deleted

## 2022-04-18 ENCOUNTER — Telehealth (HOSPITAL_COMMUNITY): Payer: Self-pay | Admitting: Cardiology

## 2022-04-18 NOTE — Telephone Encounter (Signed)
Representative with patients employer called to request updated return to work status  -return to work on 02/07/22 with rest breaks as needed lasting at least 10 mins -most recent letter states status will be updated after 3/18 appt. -please send letter to [place] fax # [phone]

## 2022-04-18 NOTE — Telephone Encounter (Signed)
-  pt reports she would like to keep restrictions at this time

## 2022-04-18 NOTE — Telephone Encounter (Signed)
Ask patient if she feels ok to return to work with no restrictions.

## 2022-04-19 ENCOUNTER — Other Ambulatory Visit: Payer: Self-pay

## 2022-04-19 ENCOUNTER — Encounter (HOSPITAL_COMMUNITY)
Admission: RE | Disposition: A | Discharge status: Home / Self Care | Payer: Self-pay | Source: Home / Self Care | Attending: Cardiology

## 2022-04-19 ENCOUNTER — Ambulatory Visit (HOSPITAL_BASED_OUTPATIENT_CLINIC_OR_DEPARTMENT_OTHER): Payer: Medicaid Other | Admitting: Certified Registered"

## 2022-04-19 ENCOUNTER — Ambulatory Visit (HOSPITAL_BASED_OUTPATIENT_CLINIC_OR_DEPARTMENT_OTHER)
Admission: RE | Admit: 2022-04-19 | Discharge: 2022-04-19 | Disposition: A | Discharge status: Home / Self Care | Payer: Medicaid Other | Source: Ambulatory Visit | Attending: Cardiology | Admitting: Cardiology

## 2022-04-19 ENCOUNTER — Other Ambulatory Visit (HOSPITAL_COMMUNITY): Payer: Self-pay | Admitting: Physician Assistant

## 2022-04-19 ENCOUNTER — Encounter (HOSPITAL_COMMUNITY): Payer: Self-pay | Admitting: Cardiology

## 2022-04-19 ENCOUNTER — Ambulatory Visit (HOSPITAL_COMMUNITY): Payer: Medicaid Other | Admitting: Certified Registered"

## 2022-04-19 ENCOUNTER — Ambulatory Visit (HOSPITAL_COMMUNITY)
Admission: RE | Admit: 2022-04-19 | Discharge: 2022-04-19 | Disposition: A | Discharge status: Home / Self Care | Payer: Medicaid Other | Attending: Cardiology | Admitting: Cardiology

## 2022-04-19 DIAGNOSIS — F17290 Nicotine dependence, other tobacco product, uncomplicated: Secondary | ICD-10-CM | POA: Diagnosis not present

## 2022-04-19 DIAGNOSIS — I110 Hypertensive heart disease with heart failure: Secondary | ICD-10-CM | POA: Diagnosis not present

## 2022-04-19 DIAGNOSIS — I2510 Atherosclerotic heart disease of native coronary artery without angina pectoris: Secondary | ICD-10-CM | POA: Diagnosis not present

## 2022-04-19 DIAGNOSIS — I252 Old myocardial infarction: Secondary | ICD-10-CM | POA: Insufficient documentation

## 2022-04-19 DIAGNOSIS — F17210 Nicotine dependence, cigarettes, uncomplicated: Secondary | ICD-10-CM

## 2022-04-19 DIAGNOSIS — Z955 Presence of coronary angioplasty implant and graft: Secondary | ICD-10-CM | POA: Insufficient documentation

## 2022-04-19 DIAGNOSIS — I080 Rheumatic disorders of both mitral and aortic valves: Secondary | ICD-10-CM | POA: Insufficient documentation

## 2022-04-19 DIAGNOSIS — F1910 Other psychoactive substance abuse, uncomplicated: Secondary | ICD-10-CM | POA: Insufficient documentation

## 2022-04-19 DIAGNOSIS — I340 Nonrheumatic mitral (valve) insufficiency: Secondary | ICD-10-CM

## 2022-04-19 DIAGNOSIS — Z7902 Long term (current) use of antithrombotics/antiplatelets: Secondary | ICD-10-CM | POA: Insufficient documentation

## 2022-04-19 DIAGNOSIS — E785 Hyperlipidemia, unspecified: Secondary | ICD-10-CM | POA: Insufficient documentation

## 2022-04-19 DIAGNOSIS — I351 Nonrheumatic aortic (valve) insufficiency: Secondary | ICD-10-CM

## 2022-04-19 DIAGNOSIS — I509 Heart failure, unspecified: Secondary | ICD-10-CM

## 2022-04-19 DIAGNOSIS — Z79899 Other long term (current) drug therapy: Secondary | ICD-10-CM | POA: Insufficient documentation

## 2022-04-19 DIAGNOSIS — I5023 Acute on chronic systolic (congestive) heart failure: Secondary | ICD-10-CM

## 2022-04-19 DIAGNOSIS — I5022 Chronic systolic (congestive) heart failure: Secondary | ICD-10-CM | POA: Insufficient documentation

## 2022-04-19 DIAGNOSIS — I352 Nonrheumatic aortic (valve) stenosis with insufficiency: Secondary | ICD-10-CM

## 2022-04-19 DIAGNOSIS — Z8673 Personal history of transient ischemic attack (TIA), and cerebral infarction without residual deficits: Secondary | ICD-10-CM | POA: Diagnosis not present

## 2022-04-19 HISTORY — PX: TEE WITHOUT CARDIOVERSION: SHX5443

## 2022-04-19 LAB — ECHO TEE
AR max vel: 1.79 cm2
AV Area VTI: 1.78 cm2
AV Area mean vel: 1.84 cm2
AV Mean grad: 17.5 mmHg
AV Peak grad: 31.9 mmHg
Ao pk vel: 2.83 m/s
Est EF: 55
P 1/2 time: 475 msec

## 2022-04-19 SURGERY — ECHOCARDIOGRAM, TRANSESOPHAGEAL
Anesthesia: Monitor Anesthesia Care

## 2022-04-19 MED ORDER — SODIUM CHLORIDE 0.9 % IV SOLN: INTRAVENOUS | Status: DC

## 2022-04-19 MED ORDER — DEXMEDETOMIDINE HCL IN NACL 80 MCG/20ML IV SOLN
INTRAVENOUS | Status: DC | PRN
  Administered 2022-04-19 (×2): 4 ug via BUCCAL

## 2022-04-19 MED ORDER — PROPOFOL 500 MG/50ML IV EMUL
INTRAVENOUS | Status: DC | PRN
  Administered 2022-04-19: 150 ug/kg/min via INTRAVENOUS

## 2022-04-19 MED ORDER — LACTATED RINGERS IV SOLN: INTRAVENOUS | Status: DC | PRN

## 2022-04-19 MED ORDER — PROPOFOL 10 MG/ML IV BOLUS
INTRAVENOUS | Status: DC | PRN
  Administered 2022-04-19: 20 mg via INTRAVENOUS

## 2022-04-19 MED ORDER — EPHEDRINE SULFATE-NACL 50-0.9 MG/10ML-% IV SOSY
PREFILLED_SYRINGE | INTRAVENOUS | Status: DC | PRN
  Administered 2022-04-19 (×3): 5 mg via INTRAVENOUS

## 2022-04-19 MED ORDER — PHENYLEPHRINE 80 MCG/ML (10ML) SYRINGE FOR IV PUSH (FOR BLOOD PRESSURE SUPPORT)
PREFILLED_SYRINGE | INTRAVENOUS | Status: DC | PRN
  Administered 2022-04-19 (×2): 80 ug via INTRAVENOUS
  Administered 2022-04-19: 40 ug via INTRAVENOUS
  Administered 2022-04-19: 80 ug via INTRAVENOUS
  Administered 2022-04-19: 40 ug via INTRAVENOUS

## 2022-04-19 MED ORDER — ONDANSETRON HCL 4 MG/2ML IJ SOLN
INTRAMUSCULAR | Status: DC | PRN
  Administered 2022-04-19: 4 mg via INTRAVENOUS

## 2022-04-19 MED ORDER — LIDOCAINE HCL (CARDIAC) PF 100 MG/5ML IV SOSY
PREFILLED_SYRINGE | INTRAVENOUS | Status: DC | PRN
  Administered 2022-04-19: 40 mg via INTRAVENOUS
  Administered 2022-04-19: 20 mg via INTRAVENOUS

## 2022-04-19 NOTE — Anesthesia Postprocedure Evaluation (Signed)
Anesthesia Post Note  Patient: Leslie Walker  Procedure(s) Performed: TRANSESOPHAGEAL ECHOCARDIOGRAM (TEE)     Patient location during evaluation: Endoscopy Anesthesia Type: MAC Level of consciousness: awake Pain management: pain level controlled Vital Signs Assessment: post-procedure vital signs reviewed and stable Respiratory status: spontaneous breathing Cardiovascular status: stable Postop Assessment: no apparent nausea or vomiting Anesthetic complications: no   No notable events documented.  Last Vitals:  Vitals:   04/19/22 1110 04/19/22 1120  BP: 129/71 132/74  Pulse: 65 64  Resp: 14 13  Temp:    SpO2: 100% 100%    Last Pain:  Vitals:   04/19/22 1120  TempSrc:   PainSc: 0-No pain                 Huston Foley

## 2022-04-19 NOTE — Transfer of Care (Signed)
Immediate Anesthesia Transfer of Care Note  Patient: Leslie Walker  Procedure(s) Performed: TRANSESOPHAGEAL ECHOCARDIOGRAM (TEE)  Patient Location: PACU and Endoscopy Unit  Anesthesia Type:MAC  Level of Consciousness: drowsy and patient cooperative  Airway & Oxygen Therapy: Patient Spontanous Breathing  Post-op Assessment: Report given to RN and Post -op Vital signs reviewed and stable  Post vital signs: Reviewed and stable  Last Vitals:  Vitals Value Taken Time  BP 124/71 04/19/22 1100  Temp 36.5 C 04/19/22 1100  Pulse 74 04/19/22 1100  Resp 20 04/19/22 1100  SpO2 100 % 04/19/22 1100    Last Pain:  Vitals:   04/19/22 1100  TempSrc: Temporal  PainSc: 0-No pain         Complications: No notable events documented.

## 2022-04-19 NOTE — Progress Notes (Deleted)
  Echocardiogram 2D Echocardiogram has been performed.  Leslie Walker 04/19/2022, 11:00 AM

## 2022-04-19 NOTE — CV Procedure (Signed)
Procedure: TEE  Indication: Aortic stenosis/regurgitation  Sedation: Per anesthesiology  Findings: Please see echo section for full report.  Normal LV size with mild LV hypertrophy.  EF 55%, normal wall motion.  Normal RV size and systolic function.  Mild left atrial enlargement, no LA appendage thrombus.  Normal right atrium.  No PFO or ASD by color doppler.  Trivial mitral regurgitation.  Trivial tricuspid regurgitation.  The aortic valve is bicuspid with fused noncoronary and right coronary cusps.  There is mild-moderate AS with mean gradient 19 and AVA 1.57 cm^2.  There is moderate-severe aortic insufficiency, no holodiastolic flow reversal in the descending thoracic aorta.  Normal caliber thoracic aorta with mild plaque.   Bicuspid aortic valve with mild-moderate AS, moderate-severe AI.   Leslie Walker 04/19/2022 10:57 AM

## 2022-04-19 NOTE — Telephone Encounter (Signed)
Letter updated and returned to [place] fax number provided

## 2022-04-19 NOTE — Anesthesia Preprocedure Evaluation (Signed)
Anesthesia Evaluation  Patient identified by MRN, date of birth, ID band Patient awake    Reviewed: Allergy & Precautions, H&P , NPO status , Patient's Chart, lab work & pertinent test results  Airway Mallampati: I  TM Distance: >3 FB Neck ROM: Full    Dental no notable dental hx. (+) Edentulous Upper, Edentulous Lower,    Pulmonary Current Smoker   Pulmonary exam normal breath sounds clear to auscultation       Cardiovascular hypertension, Pt. on medications and Pt. on home beta blockers + CAD, + Past MI and +CHF  Normal cardiovascular exam Rhythm:Regular Rate:Normal  ECHO 11/23 IMPRESSIONS     1. Severe LV dysfunction; moderate to severe AI; mildly elevated gradient  across aortic valve suggests mild AS but can partially be explained by AI.  Compared to previous study, LV function is worse.   2. Left ventricular ejection fraction, by estimation, is 20 to 25%. The  left ventricle has severely decreased function. The left ventricle  demonstrates global hypokinesis. The left ventricular internal cavity size  was moderately dilated. Left  ventricular diastolic parameters are consistent with Grade II diastolic  dysfunction (pseudonormalization). Elevated left atrial pressure.   3. Right ventricular systolic function is normal. The right ventricular  size is normal.   4. Left atrial size was moderately dilated.   5. A small pericardial effusion is present.   6. The mitral valve is normal in structure. Mild mitral valve  regurgitation. No evidence of mitral stenosis.   7. The aortic valve is calcified. Aortic valve regurgitation is moderate  to severe. No aortic stenosis is present.   8. The inferior vena cava is normal in size with greater than 50%  respiratory variability, suggesting right atrial pressure of 3 mmHg.     Neuro/Psych CVA, No Residual Symptoms  negative psych ROS   GI/Hepatic negative GI ROS,,,(+)      substance abuse    Endo/Other  negative endocrine ROS    Renal/GU negative Renal ROS  negative genitourinary   Musculoskeletal negative musculoskeletal ROS (+)  narcotic dependent  Abdominal Normal abdominal exam  (+)   Peds negative pediatric ROS (+)  Hematology negative hematology ROS (+)   Anesthesia Other Findings   Reproductive/Obstetrics negative OB ROS                              Anesthesia Physical Anesthesia Plan  ASA: 3  Anesthesia Plan: MAC   Post-op Pain Management: Minimal or no pain anticipated   Induction: Intravenous  PONV Risk Score and Plan: 2 and Propofol infusion  Airway Management Planned: Mask and Natural Airway  Additional Equipment: TEE  Intra-op Plan:   Post-operative Plan:   Informed Consent:      Dental advisory given  Plan Discussed with: CRNA  Anesthesia Plan Comments:         Anesthesia Quick Evaluation

## 2022-04-19 NOTE — Interval H&P Note (Signed)
History and Physical Interval Note:  04/19/2022 10:33 AM  Leslie Walker  has presented today for surgery, with the diagnosis of AORTIC STENOSIS.  The various methods of treatment have been discussed with the patient and family. After consideration of risks, benefits and other options for treatment, the patient has consented to  Procedure(s): TRANSESOPHAGEAL ECHOCARDIOGRAM (TEE) (N/A) as a surgical intervention.  The patient's history has been reviewed, patient examined, no change in status, stable for surgery.  I have reviewed the patient's chart and labs.  Questions were answered to the patient's satisfaction.     Jerrit Horen [employer]

## 2022-04-19 NOTE — Progress Notes (Signed)
  Echocardiogram Echocardiogram Transesophageal has been performed.  Leslie Walker 04/19/2022, 11:00 AM

## 2022-04-20 ENCOUNTER — Other Ambulatory Visit: Payer: Self-pay

## 2022-04-20 MED ORDER — SPIRONOLACTONE 25 MG PO TABS
25.0000 mg | ORAL_TABLET | Freq: Every day | ORAL | 1 refills | Status: DC
  Filled 2022-04-20: qty 30, 30d supply, fill #0
  Filled 2022-05-18: qty 30, 30d supply, fill #1

## 2022-04-20 MED ORDER — CLOPIDOGREL BISULFATE 75 MG PO TABS
75.0000 mg | ORAL_TABLET | Freq: Every day | ORAL | 1 refills | Status: DC
Start: 2022-04-20 — End: 2022-06-15
  Filled 2022-04-20: qty 30, 30d supply, fill #0
  Filled 2022-05-18: qty 30, 30d supply, fill #1

## 2022-04-20 MED ORDER — ATORVASTATIN CALCIUM 40 MG PO TABS
40.0000 mg | ORAL_TABLET | Freq: Every day | ORAL | 3 refills | Status: DC
  Filled 2022-04-20: qty 30, 30d supply, fill #0
  Filled 2022-06-15 – 2022-06-23 (×2): qty 30, 30d supply, fill #1
  Filled 2022-07-27: qty 30, 30d supply, fill #2
  Filled 2022-08-24 (×2): qty 30, 30d supply, fill #3

## 2022-04-20 MED ORDER — EZETIMIBE 10 MG PO TABS
10.0000 mg | ORAL_TABLET | Freq: Every day | ORAL | 3 refills | Status: DC
  Filled 2022-04-20: qty 30, 30d supply, fill #0
  Filled 2022-05-18: qty 30, 30d supply, fill #1
  Filled 2022-06-15 – 2022-06-23 (×2): qty 30, 30d supply, fill #2
  Filled 2022-07-27: qty 30, 30d supply, fill #3

## 2022-04-21 ENCOUNTER — Other Ambulatory Visit (HOSPITAL_COMMUNITY): Payer: Self-pay | Admitting: Emergency Medicine

## 2022-04-21 NOTE — Progress Notes (Signed)
Med rec only. Pt was not home.  Daughter let me in.  I delivered her refills: Clopidogrel, Zetia, Atorvastatin, Spironolactone. She now gets her Spironolactone free and I left paperwork for her to fill out that was provided from the pharmacy.  I will pick this up next home visit.    [name], [place] 04/21/2022

## 2022-04-22 ENCOUNTER — Encounter (HOSPITAL_COMMUNITY): Payer: Self-pay | Admitting: Cardiology

## 2022-05-03 ENCOUNTER — Other Ambulatory Visit: Payer: Self-pay

## 2022-05-04 ENCOUNTER — Other Ambulatory Visit (HOSPITAL_COMMUNITY): Payer: Self-pay | Admitting: Emergency Medicine

## 2022-05-04 ENCOUNTER — Other Ambulatory Visit: Payer: Self-pay

## 2022-05-04 NOTE — Progress Notes (Signed)
Paramedicine Encounter    Patient ID: Leslie Walker, female    DOB: 09/20/1965, 57 y.o.   MRN: [id number]   Complaints NONE  Assessment A&O x 4, skin W&D w/ good color. Lung sounds clear and equal bilat.  No peripheral edema.  Compliance with meds 100%  Pill box filled x 2 weeks  Refills needed Zetia, Spiro, Carvedilol, Clopidogrel  Meds changes since last visit     Social changes NONE   BP 108/68 (BP Location: Left Arm, Patient Position: Sitting, Cuff Size: Normal)   Pulse 65   Resp 16   Wt 134 lb 12.8 oz (61.1 kg)   LMP  (LMP Unknown) Comment: Pt unsure why she no longer gets her period  SpO2 99%   BMI 23.14 kg/m  Weight yesterday-not taken Last visit weight-133lb  ACTION: Home visit completed  [name] [phone] 05/04/22  Patient Care Team: Patient, No Pcp Per as PCP - General (General [facility]) Chrystie Nose, MD as PCP - Cardiology (Cardiology)  Patient Active Problem List   Diagnosis Date Noted   Nonrheumatic aortic valve insufficiency 04/19/2022   Coronary artery disease 11/20/2021   Elevated troponin 11/19/2021   Heart failure 11/19/2021   Acute on chronic systolic heart failure 11/18/2021   Chronic systolic heart failure    Chest pain, precordial 10/09/2020   Cocaine use 10/09/2020   NSTEMI (non-ST elevated myocardial infarction) 10/09/2020   Polysubstance abuse 10/09/2020   Hyperlipidemia 10/09/2020   Lung nodule 10/09/2020   Tobacco dependence 10/09/2020   Screening breast examination 09/04/2018   Heroin dependence 06/29/2018   TIA (transient ischemic attack) 06/24/2018   Nail fungus 04/20/2015   Porokeratosis 04/20/2015   History of ulceration 04/20/2015   Foot ulcer 02/17/2014   Cellulitis of foot, right 02/17/2014   Chronic pain syndrome 02/17/2014   Essential hypertension 02/17/2014    Current Outpatient Medications:    atorvastatin (LIPITOR) 40 MG tablet, Take 1 tablet (40 mg total) by mouth daily at 6 PM.,  Disp: 30 tablet, Rfl: 3   carvedilol (COREG) 6.25 MG tablet, Take 1 tablet (6.25 mg total) by mouth 2 (two) times daily., Disp: 180 tablet, Rfl: 3   clopidogrel (PLAVIX) 75 MG tablet, Take 1 tablet (75 mg total) by mouth daily., Disp: 30 tablet, Rfl: 1   empagliflozin (JARDIANCE) 10 MG TABS tablet, Take 1 tablet (10 mg total) by mouth daily., Disp: 30 tablet, Rfl: 1   ezetimibe (ZETIA) 10 MG tablet, Take 1 tablet (10 mg total) by mouth daily., Disp: 30 tablet, Rfl: 3   sacubitril-valsartan (ENTRESTO) 49-51 MG, Take 1 tablet by mouth 2 (two) times daily., Disp: 180 tablet, Rfl: 3   spironolactone (ALDACTONE) 25 MG tablet, Take 1 tablet (25 mg total) by mouth daily., Disp: 30 tablet, Rfl: 1 No Known Allergies   Social History   Socioeconomic History   Marital status: Single    Spouse name: Not on file   Number of children: 3   Years of education: Not on file   Highest education level: 12th grade  Occupational History   Not on file  Tobacco Use   Smoking status: Some Days    Types: Cigars   Smokeless tobacco: Never  Vaping Use   Vaping Use: Never used  Substance and Sexual Activity   Alcohol use: Yes    Comment: social drinking   Drug use: Not Currently    Types: Marijuana, Cocaine    Comment: 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   Sexual activity:  Not Currently    Birth control/protection: Post-menopausal  Other Topics Concern   Not on file  Social History Narrative   Not on file   Social Determinants of Health   Financial Resource Strain: High Risk (12/14/2021)   Overall Financial Resource Strain (CARDIA)    Difficulty of Paying Living Expenses: Hard  Food Insecurity: No Food Insecurity (12/14/2021)   Hunger Vital Sign    Worried About Running Out of Food in the Last Year: Never true    Ran Out of Food in the Last Year: Never true  Transportation Needs: No Transportation Needs (12/14/2021)   PRAPARE - Administrator, Civil Service (Medical): No    Lack of  Transportation (Non-Medical): No  Physical Activity: Inactive (02/21/2017)   Exercise Vital Sign    Days of Exercise per Week: 0 days    Minutes of Exercise per Session: 0 min  Stress: No Stress Concern Present (02/21/2017)   [employer] of Occupational Health - Occupational Stress Questionnaire    Feeling of Stress : Not at all  Social Connections: Somewhat Isolated (02/21/2017)   Social Connection and Isolation Panel [NHANES]    Frequency of Communication with Friends and Family: More than three times a week    Frequency of Social Gatherings with Friends and Family: Once a week    Attends Religious Services: 1 to 4 times per year    Active Member of [employer] or Organizations: No    Attends Banker Meetings: Never    Marital Status: Never married  Intimate Partner Violence: Not At Risk (11/19/2021)   Humiliation, Afraid, Rape, and Kick questionnaire    Fear of Current or Ex-Partner: No    Emotionally Abused: No    Physically Abused: No    Sexually Abused: No    Physical Exam      Future Appointments  Date Time Provider Department Center  05/09/2022  1:50 PM Hoy Register, MD [facility] None  05/25/2022  1:30 PM [facility]  07/05/2022  9:00 AM [facility] PA/NP [facility] None

## 2022-05-09 ENCOUNTER — Encounter: Payer: Self-pay | Admitting: Family Medicine

## 2022-05-09 ENCOUNTER — Ambulatory Visit: Payer: Self-pay | Attending: Family Medicine | Admitting: Family Medicine

## 2022-05-09 VITALS — BP 130/79 | HR 80 | Ht 64.0 in | Wt 135.0 lb

## 2022-05-09 DIAGNOSIS — Z1211 Encounter for screening for malignant neoplasm of colon: Secondary | ICD-10-CM

## 2022-05-09 DIAGNOSIS — I5022 Chronic systolic (congestive) heart failure: Secondary | ICD-10-CM

## 2022-05-09 DIAGNOSIS — I10 Essential (primary) hypertension: Secondary | ICD-10-CM

## 2022-05-09 DIAGNOSIS — Z1231 Encounter for screening mammogram for malignant neoplasm of breast: Secondary | ICD-10-CM

## 2022-05-09 DIAGNOSIS — F17200 Nicotine dependence, unspecified, uncomplicated: Secondary | ICD-10-CM

## 2022-05-09 DIAGNOSIS — I214 Non-ST elevation (NSTEMI) myocardial infarction: Secondary | ICD-10-CM

## 2022-05-09 NOTE — Patient Instructions (Signed)
The Gastroenterologist office will contact you with an appointment for a colonoscopy. I have also referred you for a mammogram as well.

## 2022-05-09 NOTE — Progress Notes (Signed)
Subjective:  Patient ID: Leslie Walker, female    DOB: 02/22/1965  Age: 57 y.o. MRN: [id number]  CC: New Patient (Initial Visit)   HPI Leslie Walker is a 57 y.o. year old female with a history of HFrEF (EF 55%), CAD (status post DES to RCA), moderate to severe AI, hypertension, hyperlipidemia, stroke (status postplacement of LINQ monitor), Nicotine dependence ( smoked since she was 39 years about 1-2 Cigarettes/day)  Interval History:  She had a visit with cardiology last month and Entresto dose was increased.  Per note not a candidate for TAVR for management of moderate to severe AI. She endorses adherence with her medications and has no chest pains or dyspnea. TTE from 04/2020 reviewed with EF of 55%, LV normal function, no regional wall motion abnormalities, concentric mild LVH, mildly dilated LA, mild to moderate AS, moderate to severe AI. She continues to smoke and is not ready to quit.  Past Medical History:  Diagnosis Date   Chronic systolic heart failure    Cocaine abuse    Dyslipidemia    Hypertension    Noncompliance w/medication treatment due to intermit use of medication    Stroke 2021   no deficits    Past Surgical History:  Procedure Laterality Date   CORONARY PRESSURE/FFR STUDY N/A 10/09/2020   Procedure: INTRAVASCULAR PRESSURE WIRE/FFR STUDY;  Surgeon: Lyn Records, MD;  Location: [facility];  Service: Cardiovascular;  Laterality: N/A;   CORONARY STENT INTERVENTION N/A 10/09/2020   Procedure: CORONARY STENT INTERVENTION;  Surgeon: Lyn Records, MD;  Location: [facility];  Service: Cardiovascular;  Laterality: N/A;   LEFT HEART CATH AND CORONARY ANGIOGRAPHY N/A 10/09/2020   Procedure: LEFT HEART CATH AND CORONARY ANGIOGRAPHY;  Surgeon: Lyn Records, MD;  Location: [facility];  Service: Cardiovascular;  Laterality: N/A;   LOOP RECORDER INSERTION N/A 06/27/2018   Procedure: LOOP RECORDER INSERTION;  Surgeon: Regan Lemming, MD;   Location: [facility];  Service: Cardiovascular;  Laterality: N/A;   RIGHT/LEFT HEART CATH AND CORONARY ANGIOGRAPHY N/A 11/22/2021   Procedure: RIGHT/LEFT HEART CATH AND CORONARY ANGIOGRAPHY;  Surgeon: Laurey Morale, MD;  Location: [facility] INVASIVE CV LAB;  Service: Cardiovascular;  Laterality: N/A;   TEE WITHOUT CARDIOVERSION N/A 11/23/2021   Procedure: TRANSESOPHAGEAL ECHOCARDIOGRAM (TEE);  Surgeon: Laurey Morale, MD;  Location: [facility] ENDOSCOPY;  Service: Cardiovascular;  Laterality: N/A;   TEE WITHOUT CARDIOVERSION N/A 04/19/2022   Procedure: TRANSESOPHAGEAL ECHOCARDIOGRAM (TEE);  Surgeon: Laurey Morale, MD;  Location: [facility] ENDOSCOPY;  Service: Cardiovascular;  Laterality: N/A;    Family History  Problem Relation Age of Onset   Hypertension Mother    Hypertension Father    Hypertension Brother    Stroke Sister     Social History   Socioeconomic History   Marital status: Single    Spouse name: Not on file   Number of children: 3   Years of education: Not on file   Highest education level: 12th grade  Occupational History   Not on file  Tobacco Use   Smoking status: Some Days    Packs/day: 1    Types: Cigars, Cigarettes   Smokeless tobacco: Never  Vaping Use   Vaping Use: Never used  Substance and Sexual Activity   Alcohol use: Not Currently    Comment: social drinking   Drug use: Not Currently    Types: Marijuana, Cocaine    Comment: 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  Sexual activity: Not Currently    Birth control/protection: Post-menopausal  Other Topics Concern   Not on file  Social History Narrative   Not on file   Social Determinants of Health   Financial Resource Strain: High Risk (12/14/2021)   Overall Financial Resource Strain (CARDIA)    Difficulty of Paying Living Expenses: Hard  Food Insecurity: No Food Insecurity (12/14/2021)   Hunger Vital Sign    Worried About Running Out of Food in the Last Year: Never true    Ran Out of Food in the Last  Year: Never true  Transportation Needs: No Transportation Needs (12/14/2021)   PRAPARE - Administrator, Civil Service (Medical): No    Lack of Transportation (Non-Medical): No  Physical Activity: Inactive (02/21/2017)   Exercise Vital Sign    Days of Exercise per Week: 0 days    Minutes of Exercise per Session: 0 min  Stress: No Stress Concern Present (02/21/2017)   [employer] of Occupational Health - Occupational Stress Questionnaire    Feeling of Stress : Not at all  Social Connections: Somewhat Isolated (02/21/2017)   Social Connection and Isolation Panel [NHANES]    Frequency of Communication with Friends and Family: More than three times a week    Frequency of Social Gatherings with Friends and Family: Once a week    Attends Religious Services: 1 to 4 times per year    Active Member of [employer] or Organizations: No    Attends Banker Meetings: Never    Marital Status: Never married    No Known Allergies  Outpatient Medications Prior to Visit  Medication Sig Dispense Refill   atorvastatin (LIPITOR) 40 MG tablet Take 1 tablet (40 mg total) by mouth daily at 6 PM. 30 tablet 3   carvedilol (COREG) 6.25 MG tablet Take 1 tablet (6.25 mg total) by mouth 2 (two) times daily. 180 tablet 3   clopidogrel (PLAVIX) 75 MG tablet Take 1 tablet (75 mg total) by mouth daily. 30 tablet 1   empagliflozin (JARDIANCE) 10 MG TABS tablet Take 1 tablet (10 mg total) by mouth daily. 30 tablet 1   ezetimibe (ZETIA) 10 MG tablet Take 1 tablet (10 mg total) by mouth daily. 30 tablet 3   sacubitril-valsartan (ENTRESTO) 49-51 MG Take 1 tablet by mouth 2 (two) times daily. 180 tablet 3   spironolactone (ALDACTONE) 25 MG tablet Take 1 tablet (25 mg total) by mouth daily. 30 tablet 1   No facility-administered medications prior to visit.     ROS Review of Systems  Constitutional:  Negative for activity change and appetite change.  HENT:  Negative for sinus pressure and sore  throat.   Respiratory:  Negative for chest tightness, shortness of breath and wheezing.   Cardiovascular:  Negative for chest pain and palpitations.  Gastrointestinal:  Negative for abdominal distention, abdominal pain and constipation.  Genitourinary: Negative.   Musculoskeletal: Negative.   Psychiatric/Behavioral:  Negative for behavioral problems and dysphoric mood.     Objective:  BP 130/79 (BP Location: Left Arm, Patient Position: Sitting, Cuff Size: Normal)   Pulse 80   Ht 5\' 4"  (1.626 m)   Wt 135 lb (61.2 kg)   LMP  (LMP Unknown) Comment: Pt unsure why she no longer gets her period  SpO2 97%   BMI 23.17 kg/m      05/09/2022    2:10 PM 05/04/2022    2:25 PM 04/19/2022   11:20 AM  BP/Weight  Systolic BP 130  108 132  Diastolic BP 79 68 74  Wt. (Lbs) 135 134.8   BMI 23.17 kg/m2 23.14 kg/m2       Physical Exam Constitutional:      Appearance: She is well-developed.  Cardiovascular:     Rate and Rhythm: Normal rate.     Heart sounds: Murmur heard.  Pulmonary:     Effort: Pulmonary effort is normal.     Breath sounds: Normal breath sounds. No wheezing or rales.  Chest:     Chest wall: No tenderness.  Abdominal:     General: Bowel sounds are normal. There is no distension.     Palpations: Abdomen is soft. There is no mass.     Tenderness: There is no abdominal tenderness.  Musculoskeletal:        General: Normal range of motion.     Right lower leg: No edema.     Left lower leg: No edema.  Neurological:     Mental Status: She is alert and oriented to person, place, and time.  Psychiatric:        Mood and Affect: Mood normal.        Latest Ref Rng & Units 04/14/2022   11:30 AM 04/04/2022    9:54 AM 02/02/2022    3:23 PM  CMP  Glucose 70 - 99 mg/dL 161  096  045   BUN 6 - 20 mg/dL Creatinine 0.44 - 1.00 mg/dL 4.09  8.11  9.14   Sodium 135 - 145 mmol/L 138  138  137   Potassium 3.5 - 5.1 mmol/L 4.3  4.1  4.2   Chloride 98 - 111 mmol/L 103  105   103   CO2 22 - 32 mmol/L Calcium 8.9 - 10.3 mg/dL 9.2  8.8  8.9     Lipid Panel     Component Value Date/Time   CHOL 115 04/04/2022 0954   CHOL 156 03/03/2021 1132   TRIG 78 04/04/2022 0954   HDL 44 04/04/2022 0954   HDL 60 03/03/2021 1132   CHOLHDL 2.6 04/04/2022 0954   VLDL 16 04/04/2022 0954   LDLCALC 55 04/04/2022 0954   LDLCALC 80 03/03/2021 1132    CBC    Component Value Date/Time   WBC 5.3 11/19/2021 0339   RBC 4.01 11/19/2021 0339   HGB 12.6 11/22/2021 1400   HGB 12.6 11/22/2021 1400   HCT 37.0 11/22/2021 1400   HCT 37.0 11/22/2021 1400   PLT 303 11/19/2021 0339   MCV 83.0 11/19/2021 0339   MCH 29.9 11/19/2021 0339   MCHC 36.0 11/19/2021 0339   RDW 13.9 11/19/2021 0339   LYMPHSABS 2.8 11/19/2021 0339   MONOABS 0.4 11/19/2021 0339   EOSABS 0.2 11/19/2021 0339   BASOSABS 0.1 11/19/2021 0339    Lab Results  Component Value Date   HGBA1C 5.6 10/09/2020    Assessment & Plan:  1. Essential hypertension Controlled Continue antihypertensive Counseled on blood pressure goal of less than 130/80, low-sodium, DASH diet, medication compliance, 150 minutes of moderate intensity exercise per week. Discussed medication compliance, adverse effects.   2. NSTEMI (non-ST elevated myocardial infarction) Status post DES to RCA in 2022 Risk factor modification Continue statin Follow-up with cardiology  3. Chronic systolic heart failure EF of 50 to 55%, LV global hypokinesis, grade 1 DD from echo of 03/2022 Currently on GDMT Euvolemic  4. Encounter for screening mammogram for malignant neoplasm of breast -  MM DIGITAL SCREENING BILATERAL; Future  5. Screening for colon cancer - Ambulatory referral to Gastroenterology  6. Tobacco dependence Smoking cessation support: smoking cessation hotline: 1-800-QUIT-NOW.  Smoking cessation classes are available through [facility]. Call [phone] or visit our website at  [email or web address].  Spent 3 minutes counseling on dangers of tobacco use and benefits of quitting, offered pharmacological intervention to aid quitting and patient is not ready to quit.    No orders of the defined types were placed in this encounter.   Follow-up: Return in about 6 months (around 11/08/2022) for Chronic medical conditions.       Hoy Register, MD, FAAFP. [facility] [place], [place] [phone]   05/09/2022, 5:46 PM

## 2022-05-10 ENCOUNTER — Other Ambulatory Visit: Payer: Self-pay

## 2022-05-18 ENCOUNTER — Other Ambulatory Visit: Payer: Self-pay

## 2022-05-18 ENCOUNTER — Other Ambulatory Visit (HOSPITAL_COMMUNITY): Payer: Self-pay | Admitting: Emergency Medicine

## 2022-05-18 NOTE — Progress Notes (Signed)
Paramedicine Encounter    Patient ID: Leslie Walker, female    DOB: [birth year], 57 y.o.   MRN: [id number]   Complaints NONE  Assessment A&O x 4, skin W&D, w/ good color.  No edema noted, lung sounds clear and equal bilat.  Compliance with meds missed p.m. dose on 4/30  Pill box filled x 1 week  Refills needed Carvedilol, zetia, Spironolactone, Clopidogrel  Meds changes since last visit none    Social changes NONE   BP (!) 180/100 (BP Location: Right Arm, Patient Position: Sitting, Cuff Size: Normal)   Pulse 61   Resp 14   Wt 134 lb 12.8 oz (61.1 kg)   LMP  (LMP Unknown) Comment: Pt unsure why she no longer gets her period  SpO2 99%   BMI 23.14 kg/m  Weight yesterday-not taken Last visit weight-134lb  Admits to eating salted french fries yesterday at work. Reviewed the importance of avoiding foods with high sodium content.  Refills called in for Carvedilol, Zetia, Spironolactone & Clopidogrel.   She advises she still has not heard whether her medicaid has been approved or not.   ACTION: Home visit completed  [name] [phone] 05/19/22  Patient Care Team: Hoy Register, MD as PCP - General (Family Medicine) Chrystie Nose, MD as PCP - Cardiology (Cardiology)  Patient Active Problem List   Diagnosis Date Noted   Nonrheumatic aortic valve insufficiency 04/19/2022   Coronary artery disease 11/20/2021   Elevated troponin 11/19/2021   Heart failure (HCC) 11/19/2021   Acute on chronic systolic heart failure (HCC) 11/18/2021   Chronic systolic heart failure (HCC)    Chest pain, precordial 10/09/2020   Cocaine use 10/09/2020   NSTEMI (non-ST elevated myocardial infarction) (HCC) 10/09/2020   Polysubstance abuse (HCC) 10/09/2020   Hyperlipidemia 10/09/2020   Lung nodule 10/09/2020   Tobacco dependence 10/09/2020   Screening breast examination 09/04/2018   Heroin dependence (HCC) 06/29/2018   TIA (transient ischemic attack) 06/24/2018    Nail fungus 04/20/2015   Porokeratosis 04/20/2015   History of ulceration 04/20/2015   Foot ulcer (HCC) 02/17/2014   Cellulitis of foot, right 02/17/2014   Chronic pain syndrome 02/17/2014   Essential hypertension 02/17/2014    Current Outpatient Medications:    atorvastatin (LIPITOR) 40 MG tablet, Take 1 tablet (40 mg total) by mouth daily at 6 PM., Disp: 30 tablet, Rfl: 3   carvedilol (COREG) 6.25 MG tablet, Take 1 tablet (6.25 mg total) by mouth 2 (two) times daily., Disp: 180 tablet, Rfl: 3   clopidogrel (PLAVIX) 75 MG tablet, Take 1 tablet (75 mg total) by mouth daily., Disp: 30 tablet, Rfl: 1   empagliflozin (JARDIANCE) 10 MG TABS tablet, Take 1 tablet (10 mg total) by mouth daily., Disp: 30 tablet, Rfl: 1   ezetimibe (ZETIA) 10 MG tablet, Take 1 tablet (10 mg total) by mouth daily., Disp: 30 tablet, Rfl: 3   sacubitril-valsartan (ENTRESTO) 49-51 MG, Take 1 tablet by mouth 2 (two) times daily., Disp: 180 tablet, Rfl: 3   spironolactone (ALDACTONE) 25 MG tablet, Take 1 tablet (25 mg total) by mouth daily., Disp: 30 tablet, Rfl: 1 No Known Allergies   Social History   Socioeconomic History   Marital status: Single    Spouse name: Not on file   Number of children: 3   Years of education: Not on file   Highest education level: 12th grade  Occupational History   Not on file  Tobacco Use   Smoking status: Some Days  Packs/day: 1    Types: Cigars, Cigarettes   Smokeless tobacco: Never  Vaping Use   Vaping Use: Never used  Substance and Sexual Activity   Alcohol use: Not Currently    Comment: social drinking   Drug use: Not Currently    Types: Marijuana, Cocaine    Comment: 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   Sexual activity: Not Currently    Birth control/protection: Post-menopausal  Other Topics Concern   Not on file  Social History Narrative   Not on file   Social Determinants of Health   Financial Resource Strain: High Risk (12/14/2021)   Overall Financial  Resource Strain (CARDIA)    Difficulty of Paying Living Expenses: Hard  Food Insecurity: No Food Insecurity (12/14/2021)   Hunger Vital Sign    Worried About Running Out of Food in the Last Year: Never true    Ran Out of Food in the Last Year: Never true  Transportation Needs: No Transportation Needs (12/14/2021)   PRAPARE - Administrator, Civil Service (Medical): No    Lack of Transportation (Non-Medical): No  Physical Activity: Inactive (02/21/2017)   Exercise Vital Sign    Days of Exercise per Week: 0 days    Minutes of Exercise per Session: 0 min  Stress: No Stress Concern Present (02/21/2017)   [employer] of Occupational Health - Occupational Stress Questionnaire    Feeling of Stress : Not at all  Social Connections: Somewhat Isolated (02/21/2017)   Social Connection and Isolation Panel [NHANES]    Frequency of Communication with Friends and Family: More than three times a week    Frequency of Social Gatherings with Friends and Family: Once a week    Attends Religious Services: 1 to 4 times per year    Active Member of [employer] or Organizations: No    Attends Banker Meetings: Never    Marital Status: Never married  Intimate Partner Violence: Not At Risk (11/19/2021)   Humiliation, Afraid, Rape, and Kick questionnaire    Fear of Current or Ex-Partner: No    Emotionally Abused: No    Physically Abused: No    Sexually Abused: No    Physical Exam      Future Appointments  Date Time Provider Department Center  05/25/2022  1:30 PM [facility] PHARMACIST [facility]  07/05/2022  9:00 AM [facility] PA/NP [facility] None  11/08/2022  1:30 PM Hoy Register, MD [facility] None

## 2022-05-20 ENCOUNTER — Other Ambulatory Visit: Payer: Self-pay

## 2022-05-24 ENCOUNTER — Other Ambulatory Visit (HOSPITAL_COMMUNITY): Payer: Self-pay | Admitting: Emergency Medicine

## 2022-05-24 NOTE — Progress Notes (Unsigned)
Pt not home.  Daughter let me in.  Delivered refills: Clopidogrel, Zetia, Carvedilol, Spironolactone.  Med box reconciled x 2 weeks.    Beatrix Shipper, EMT-Paramedic [phone] 05/24/2022

## 2022-05-25 ENCOUNTER — Ambulatory Visit: Payer: Medicaid Other | Attending: Internal Medicine | Admitting: Pharmacist

## 2022-05-25 DIAGNOSIS — E785 Hyperlipidemia, unspecified: Secondary | ICD-10-CM | POA: Diagnosis not present

## 2022-05-25 NOTE — Progress Notes (Signed)
Patient ID: Leslie Walker                 DOB: 01/01/1966                    MRN: [id number]     HPI: Leslie Walker is a 57 y.o. female patient referred to lipid clinic by Dr Shirlee Latch. PMH is significant for HTN, HLD, CAD s/p NSTEMI 09/2020, chronic HFrEF, cryptogenic stroke 2020 s/p linq placement, and polysubstance abuse with heroin and cocaine. Admitted 11/2021 with CHF, had been out of meds for 3 months and hadn't called for refills, also with recent drug use. She was enrolled in the paramedicine program. Her EF at that time was 20-25%. CHF follow up appt 04/04/22 echo improved to 50-55%, was smoking 1 cigar per day but not using any drugs.   Pt presents today for follow up. Reports tolerating her medication well, denies adherence issues. LDL improved from 116 to 55 on most recent lipid panel. Doesn't have prescription insurance - she receives her Sherryll Burger and London Pepper from pt assistance.  Current Medications: atorvastatin 40mg  daily, ezetimibe 10mg  daily Risk Factors: premature CAD, stroke, HTN LDL goal: 55mg /dL  Diet: watching her salt  Exercise: cuts the grass  Family History: Mother, father, and brother with HTN, sister with stroke.  Social History: Prior heroin, marijuana, and cocaine use. Occasional tobacco use. Social alcohol use.  Labs: 04/04/22: TC 115, TG 78, HDL 44, LDL 55 11/24/21: TC 178, TG 78, HDL 46, LDL 116 11/23/21: Lp(a) 294.9  Past Medical History:  Diagnosis Date   Chronic systolic heart failure (HCC)    Cocaine abuse (HCC)    Dyslipidemia    Hypertension    Noncompliance w/medication treatment due to intermit use of medication    Stroke (HCC) 2021   no deficits    Current Outpatient Medications on File Prior to Visit  Medication Sig Dispense Refill   atorvastatin (LIPITOR) 40 MG tablet Take 1 tablet (40 mg total) by mouth daily at 6 PM. 30 tablet 3   carvedilol (COREG) 6.25 MG tablet Take 1 tablet (6.25 mg total) by mouth 2 (two) times daily. 180 tablet 3    clopidogrel (PLAVIX) 75 MG tablet Take 1 tablet (75 mg total) by mouth daily. 30 tablet 1   empagliflozin (JARDIANCE) 10 MG TABS tablet Take 1 tablet (10 mg total) by mouth daily. 30 tablet 1   ezetimibe (ZETIA) 10 MG tablet Take 1 tablet (10 mg total) by mouth daily. 30 tablet 3   sacubitril-valsartan (ENTRESTO) 49-51 MG Take 1 tablet by mouth 2 (two) times daily. 180 tablet 3   spironolactone (ALDACTONE) 25 MG tablet Take 1 tablet (25 mg total) by mouth daily. 30 tablet 1   No current facility-administered medications on file prior to visit.    No Known Allergies  Assessment/Plan:  1. Hyperlipidemia - LDL is 55, right at goal < 55 given her history of premature ASCVD. She is currently taking atorvastatin 40mg  daily and ezetimibe 10mg  daily and tolerating well, will continue both meds. Primarily discussed her elevated Lp(a) of 295 today and her residual cardiac risk due to this. Discussed that PCSK9i can lower Lp(a) by ~25%, pt does not think she could give herself injections (coverage would likely be difficult without insurance and since her LDL is already well controlled at 55). Also discussed ACCLAIM-Lp(a) trial investigating lepodisiran which lowers Lp(a) by > 90% and is currently enrolling in its cardiovascular outcomes study. Pt is interested in clinical  trial, will forward her info to Medication Management team for follow up with enrollment.  Yazir Koerber E. Schuyler Behan, PharmD, BCACP, CPP [facility] [address] [place], [place] [address] Phone: [phone]; Fax: [phone] 05/25/2022 1:56 PM

## 2022-05-25 NOTE — Patient Instructions (Addendum)
Your LDL cholesterol is 55 which is right at your goal  Continue taking your atorvastatin and ezetimibe  Your lipoprotein a is elevated at 295. This is a separate risk factor for heart disease that's primarily genetically determined. You are being referred for a clinical trial that can lower this level by ~90%.  I'll forward your information to our research team so they can coordinate follow up for a study investigating a medication that lowers your Lipoprotein a

## 2022-06-10 ENCOUNTER — Other Ambulatory Visit (HOSPITAL_COMMUNITY): Payer: Self-pay | Admitting: Emergency Medicine

## 2022-06-10 ENCOUNTER — Other Ambulatory Visit: Payer: Self-pay

## 2022-06-10 NOTE — Progress Notes (Signed)
Med rec only today.  Patient at work. Filled pill box x 2 weeks. Refills: Atrovastatin, Clopidogrel, Zetia, Spironolactone  Home visit scheduled for Wednesday 5/29 @ 9:00     Beatrix Shipper, Anne Ng [phone] 06/10/2022

## 2022-06-15 ENCOUNTER — Other Ambulatory Visit (HOSPITAL_COMMUNITY): Payer: Self-pay | Admitting: Emergency Medicine

## 2022-06-15 ENCOUNTER — Other Ambulatory Visit (HOSPITAL_COMMUNITY): Payer: Self-pay | Admitting: Cardiology

## 2022-06-15 ENCOUNTER — Other Ambulatory Visit: Payer: Self-pay

## 2022-06-15 DIAGNOSIS — I2510 Atherosclerotic heart disease of native coronary artery without angina pectoris: Secondary | ICD-10-CM

## 2022-06-15 MED ORDER — CLOPIDOGREL BISULFATE 75 MG PO TABS
75.0000 mg | ORAL_TABLET | Freq: Every day | ORAL | 1 refills | Status: DC
Start: 2022-06-15 — End: 2022-08-24
  Filled 2022-06-15 – 2022-06-23 (×3): qty 30, 30d supply, fill #0
  Filled 2022-07-27: qty 30, 30d supply, fill #1

## 2022-06-15 MED ORDER — SPIRONOLACTONE 25 MG PO TABS
25.0000 mg | ORAL_TABLET | Freq: Every day | ORAL | 1 refills | Status: DC
  Filled 2022-06-15: qty 30, 30d supply, fill #0
  Filled 2022-06-15: qty 60, 60d supply, fill #0
  Filled 2022-06-23: qty 30, 30d supply, fill #0
  Filled 2022-07-27: qty 30, 30d supply, fill #1

## 2022-06-15 NOTE — Progress Notes (Signed)
Paramedicine Encounter    Patient ID: Leslie Walker, female    DOB: [birth year], 57 y.o.   MRN: [id number]   Complaints NONE  Assessment A&O x 4, skin W&D w/ good color.  Lung sounds clear and equal bilat.  No edema noted.    Compliance with meds 100%  Pill box filled YES  Refills needed NONE  Meds changes since last visit NONE    Social changes NONE   BP (!) 140/80 (BP Location: Right Arm, Patient Position: Sitting, Cuff Size: Normal)   Pulse 66   Resp 14   Wt 127 lb 9.6 oz (57.9 kg)   LMP  (LMP Unknown) Comment: Pt unsure why she no longer gets her period  SpO2 98%   BMI 21.90 kg/m  Weight yesterday-Not taken Last visit weight-134lb 05/24/22  Leslie Walker was just waking up on my arrival for today's home visit.  She says she is feeling good but is tired.  She says she is working a lot at her job and helping an older friend who is sick.  She admits to some alcohol use as there is a half empty bottle of Gin on her dresser that she said her daughter gave her for Mother's Day.  I encouraged her not to drink for her health and she says she understands.  She denies any recreational drug use.   Because she has been working so much it has been difficult to do in person visits.  He last was weight was 05/24/22 and today she is down 7 lbs.  ACTION: Home visit completed  Leslie Walker [phone] 06/15/22  Patient Care Team: Hoy Register, MD as PCP - General (Family Medicine) Chrystie Nose, MD as PCP - Cardiology (Cardiology)  Patient Active Problem List   Diagnosis Date Noted   Nonrheumatic aortic valve insufficiency 04/19/2022   Coronary artery disease 11/20/2021   Elevated troponin 11/19/2021   Heart failure (HCC) 11/19/2021   Acute on chronic systolic heart failure (HCC) 11/18/2021   Chronic systolic heart failure (HCC)    Chest pain, precordial 10/09/2020   Cocaine use 10/09/2020   NSTEMI (non-ST elevated myocardial infarction) (HCC) 10/09/2020    Polysubstance abuse (HCC) 10/09/2020   Hyperlipidemia 10/09/2020   Lung nodule 10/09/2020   Tobacco dependence 10/09/2020   Screening breast examination 09/04/2018   Heroin dependence (HCC) 06/29/2018   TIA (transient ischemic attack) 06/24/2018   Nail fungus 04/20/2015   Porokeratosis 04/20/2015   History of ulceration 04/20/2015   Foot ulcer (HCC) 02/17/2014   Cellulitis of foot, right 02/17/2014   Chronic pain syndrome 02/17/2014   Essential hypertension 02/17/2014    Current Outpatient Medications:    atorvastatin (LIPITOR) 40 MG tablet, Take 1 tablet (40 mg total) by mouth daily at 6 PM., Disp: 30 tablet, Rfl: 3   carvedilol (COREG) 6.25 MG tablet, Take 1 tablet (6.25 mg total) by mouth 2 (two) times daily., Disp: 180 tablet, Rfl: 3   clopidogrel (PLAVIX) 75 MG tablet, Take 1 tablet (75 mg total) by mouth daily., Disp: 30 tablet, Rfl: 1   empagliflozin (JARDIANCE) 10 MG TABS tablet, Take 1 tablet (10 mg total) by mouth daily., Disp: 30 tablet, Rfl: 1   ezetimibe (ZETIA) 10 MG tablet, Take 1 tablet (10 mg total) by mouth daily., Disp: 30 tablet, Rfl: 3   sacubitril-valsartan (ENTRESTO) 49-51 MG, Take 1 tablet by mouth 2 (two) times daily., Disp: 180 tablet, Rfl: 3   spironolactone (ALDACTONE) 25 MG tablet, Take 1 tablet (25 mg  total) by mouth daily., Disp: 30 tablet, Rfl: 1 No Known Allergies   Social History   Socioeconomic History   Marital status: Single    Spouse name: Not on file   Number of children: 3   Years of education: Not on file   Highest education level: 12th grade  Occupational History   Not on file  Tobacco Use   Smoking status: Some Days    Packs/day: 1    Types: Cigars, Cigarettes   Smokeless tobacco: Never  Vaping Use   Vaping Use: Never used  Substance and Sexual Activity   Alcohol use: Not Currently    Comment: social drinking   Drug use: Not Currently    Types: Marijuana, Cocaine    Comment: 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   Sexual  activity: Not Currently    Birth control/protection: Post-menopausal  Other Topics Concern   Not on file  Social History Narrative   Not on file   Social Determinants of Health   Financial Resource Strain: High Risk (12/14/2021)   Overall Financial Resource Strain (CARDIA)    Difficulty of Paying Living Expenses: Hard  Food Insecurity: No Food Insecurity (12/14/2021)   Hunger Vital Sign    Worried About Running Out of Food in the Last Year: Never true    Ran Out of Food in the Last Year: Never true  Transportation Needs: No Transportation Needs (12/14/2021)   PRAPARE - Administrator, Civil Service (Medical): No    Lack of Transportation (Non-Medical): No  Physical Activity: Inactive (02/21/2017)   Exercise Vital Sign    Days of Exercise per Week: 0 days    Minutes of Exercise per Session: 0 min  Stress: No Stress Concern Present (02/21/2017)   [employer] of Occupational Health - Occupational Stress Questionnaire    Feeling of Stress : Not at all  Social Connections: Somewhat Isolated (02/21/2017)   Social Connection and Isolation Panel [NHANES]    Frequency of Communication with Friends and Family: More than three times a week    Frequency of Social Gatherings with Friends and Family: Once a week    Attends Religious Services: 1 to 4 times per year    Active Member of [employer] or Organizations: No    Attends Banker Meetings: Never    Marital Status: Never married  Intimate Partner Violence: Not At Risk (11/19/2021)   Humiliation, Afraid, Rape, and Kick questionnaire    Fear of Current or Ex-Partner: No    Emotionally Abused: No    Physically Abused: No    Sexually Abused: No    Physical Exam      Future Appointments  Date Time Provider Department Center  07/05/2022  9:00 AM [facility] PA/NP [facility] None  11/08/2022  1:30 PM Hoy Register, MD [facility] None

## 2022-06-15 NOTE — Progress Notes (Signed)
Patient has been scheduled to screen for Lilly EZEF trial (lepodisiran vs placebo) on 07/13/22.  Thanks for the referral

## 2022-06-21 ENCOUNTER — Other Ambulatory Visit: Payer: Self-pay

## 2022-06-22 ENCOUNTER — Other Ambulatory Visit: Payer: Self-pay

## 2022-06-23 ENCOUNTER — Telehealth (HOSPITAL_COMMUNITY): Payer: Self-pay | Admitting: Emergency Medicine

## 2022-06-23 ENCOUNTER — Other Ambulatory Visit (HOSPITAL_COMMUNITY): Payer: Self-pay | Admitting: Emergency Medicine

## 2022-06-23 ENCOUNTER — Other Ambulatory Visit: Payer: Self-pay

## 2022-06-23 NOTE — Telephone Encounter (Signed)
LVM w/ Ms. Thomas concerning status of Ms. Lewallen's medicaid and left my number for her to return my call at her earliest convenience.    Beatrix Shipper, EMT-Paramedic [phone] 06/23/2022

## 2022-06-24 ENCOUNTER — Telehealth (HOSPITAL_COMMUNITY): Payer: Self-pay | Admitting: Emergency Medicine

## 2022-06-24 NOTE — Progress Notes (Signed)
Refills for Atorvastatin, Spironolactone, Zetia and Clopidogrel picked up from [facility] [employer] and delivered to [name]'s residence and given to her daughter due to pt being at work.    Leslie Walker, EMT-Paramedic [phone] 06/24/2022

## 2022-06-24 NOTE — Telephone Encounter (Signed)
Ms. Maisie Fus returned my call regarding Ms. Lattner's medicaid application status.  She advised it is pending with [employer] which is a 90 day process that began 06/09/22  so it will be the end of August  before she hears back to whether she's been approved or not.  In the meantime she is covered under Medicaid Expansion with an ID # of [id number] S.

## 2022-06-29 ENCOUNTER — Other Ambulatory Visit (HOSPITAL_COMMUNITY): Payer: Self-pay | Admitting: Emergency Medicine

## 2022-06-29 NOTE — Progress Notes (Signed)
Paramedicine Encounter    Patient ID: Leslie Walker, female    DOB: 1965-03-27, 57 y.o.   MRN: [id number]   Complaints NONE    Assessment A&Ox 4, skin W&D w/good color.  Compliance with meds 100%  Pill box filled x 2 weeks  Refills needed Jardiance  (1 pill Mon&Tues week 1)  Meds changes since last visit NONE    Social changes NONE   BP (!) 160/90 (BP Location: Left Arm, Patient Position: Sitting, Cuff Size: Normal)   Pulse 80   Resp 14   Wt 127 lb 12.8 oz (58 kg)   LMP  (LMP Unknown) Comment: Pt unsure why she no longer gets her period  SpO2 99%   BMI 21.94 kg/m  Weight yesterday-not taken Last visit weight-127lb  Leslie Walker's BP is up this morning.  She has not yet taken her morning meds.  She denies chest pain or SOB.  Lung sounds clear throughout.  Med box reconciled x 2 weeks.  Pill box good through 6/26. Next appt. Paramedicine visit 07/13/22 ACTION: Home visit completed  Leslie Walker [phone] 06/29/22  Patient Care Team: Hoy Register, MD as PCP - General (Family Medicine) Chrystie Nose, MD as PCP - Cardiology (Cardiology)  Patient Active Problem List   Diagnosis Date Noted   Nonrheumatic aortic valve insufficiency 04/19/2022   Coronary artery disease 11/20/2021   Elevated troponin 11/19/2021   Heart failure (HCC) 11/19/2021   Acute on chronic systolic heart failure (HCC) 11/18/2021   Chronic systolic heart failure (HCC)    Chest pain, precordial 10/09/2020   Cocaine use 10/09/2020   NSTEMI (non-ST elevated myocardial infarction) (HCC) 10/09/2020   Polysubstance abuse (HCC) 10/09/2020   Hyperlipidemia 10/09/2020   Lung nodule 10/09/2020   Tobacco dependence 10/09/2020   Screening breast examination 09/04/2018   Heroin dependence (HCC) 06/29/2018   TIA (transient ischemic attack) 06/24/2018   Nail fungus 04/20/2015   Porokeratosis 04/20/2015   History of ulceration 04/20/2015   Foot ulcer (HCC) 02/17/2014   Cellulitis of  foot, right 02/17/2014   Chronic pain syndrome 02/17/2014   Essential hypertension 02/17/2014    Current Outpatient Medications:    carvedilol (COREG) 6.25 MG tablet, Take 1 tablet (6.25 mg total) by mouth 2 (two) times daily., Disp: 180 tablet, Rfl: 3   clopidogrel (PLAVIX) 75 MG tablet, Take 1 tablet (75 mg total) by mouth daily., Disp: 30 tablet, Rfl: 1   empagliflozin (JARDIANCE) 10 MG TABS tablet, Take 1 tablet (10 mg total) by mouth daily., Disp: 30 tablet, Rfl: 1   ezetimibe (ZETIA) 10 MG tablet, Take 1 tablet (10 mg total) by mouth daily., Disp: 30 tablet, Rfl: 3   sacubitril-valsartan (ENTRESTO) 49-51 MG, Take 1 tablet by mouth 2 (two) times daily., Disp: 180 tablet, Rfl: 3   spironolactone (ALDACTONE) 25 MG tablet, Take 1 tablet (25 mg total) by mouth daily., Disp: 30 tablet, Rfl: 1   atorvastatin (LIPITOR) 40 MG tablet, Take 1 tablet (40 mg total) by mouth daily at 6 PM., Disp: 30 tablet, Rfl: 3 No Known Allergies   Social History   Socioeconomic History   Marital status: Single    Spouse name: Not on file   Number of children: 3   Years of education: Not on file   Highest education level: 12th grade  Occupational History   Not on file  Tobacco Use   Smoking status: Some Days    Packs/day: 1    Types: Cigars, Cigarettes   Smokeless tobacco:  Never  Vaping Use   Vaping Use: Never used  Substance and Sexual Activity   Alcohol use: Not Currently    Comment: social drinking   Drug use: Not Currently    Types: Marijuana, Cocaine    Comment: 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   Sexual activity: Not Currently    Birth control/protection: Post-menopausal  Other Topics Concern   Not on file  Social History Narrative   Not on file   Social Determinants of Health   Financial Resource Strain: High Risk (12/14/2021)   Overall Financial Resource Strain (CARDIA)    Difficulty of Paying Living Expenses: Hard  Food Insecurity: No Food Insecurity (12/14/2021)   Hunger Vital  Sign    Worried About Running Out of Food in the Last Year: Never true    Ran Out of Food in the Last Year: Never true  Transportation Needs: No Transportation Needs (12/14/2021)   PRAPARE - Administrator, Civil Service (Medical): No    Lack of Transportation (Non-Medical): No  Physical Activity: Inactive (02/21/2017)   Exercise Vital Sign    Days of Exercise per Week: 0 days    Minutes of Exercise per Session: 0 min  Stress: No Stress Concern Present (02/21/2017)   [employer] of Occupational Health - Occupational Stress Questionnaire    Feeling of Stress : Not at all  Social Connections: Somewhat Isolated (02/21/2017)   Social Connection and Isolation Panel [NHANES]    Frequency of Communication with Friends and Family: More than three times a week    Frequency of Social Gatherings with Friends and Family: Once a week    Attends Religious Services: 1 to 4 times per year    Active Member of [employer] or Organizations: No    Attends Banker Meetings: Never    Marital Status: Never married  Intimate Partner Violence: Not At Risk (11/19/2021)   Humiliation, Afraid, Rape, and Kick questionnaire    Fear of Current or Ex-Partner: No    Emotionally Abused: No    Physically Abused: No    Sexually Abused: No    Physical Exam      Future Appointments  Date Time Provider Department Center  07/05/2022  9:00 AM [facility] PA/NP [facility] None  11/08/2022  1:30 PM Hoy Register, MD [facility] None

## 2022-07-05 ENCOUNTER — Telehealth (HOSPITAL_COMMUNITY): Payer: Self-pay

## 2022-07-05 ENCOUNTER — Encounter (HOSPITAL_COMMUNITY): Payer: Self-pay

## 2022-07-05 ENCOUNTER — Other Ambulatory Visit: Payer: Self-pay

## 2022-07-05 ENCOUNTER — Ambulatory Visit (HOSPITAL_COMMUNITY)
Admission: RE | Admit: 2022-07-05 | Discharge: 2022-07-05 | Disposition: A | Discharge status: Home / Self Care | Payer: Medicaid Other | Source: Ambulatory Visit | Attending: Family Medicine | Admitting: Family Medicine

## 2022-07-05 VITALS — BP 148/82 | HR 74 | Ht 64.0 in | Wt 129.0 lb

## 2022-07-05 DIAGNOSIS — F1911 Other psychoactive substance abuse, in remission: Secondary | ICD-10-CM | POA: Diagnosis not present

## 2022-07-05 DIAGNOSIS — I351 Nonrheumatic aortic (valve) insufficiency: Secondary | ICD-10-CM | POA: Diagnosis not present

## 2022-07-05 DIAGNOSIS — I252 Old myocardial infarction: Secondary | ICD-10-CM | POA: Insufficient documentation

## 2022-07-05 DIAGNOSIS — Z7902 Long term (current) use of antithrombotics/antiplatelets: Secondary | ICD-10-CM | POA: Insufficient documentation

## 2022-07-05 DIAGNOSIS — Z8673 Personal history of transient ischemic attack (TIA), and cerebral infarction without residual deficits: Secondary | ICD-10-CM | POA: Diagnosis not present

## 2022-07-05 DIAGNOSIS — I5022 Chronic systolic (congestive) heart failure: Secondary | ICD-10-CM | POA: Insufficient documentation

## 2022-07-05 DIAGNOSIS — Z79899 Other long term (current) drug therapy: Secondary | ICD-10-CM | POA: Diagnosis not present

## 2022-07-05 DIAGNOSIS — Q231 Congenital insufficiency of aortic valve: Secondary | ICD-10-CM | POA: Insufficient documentation

## 2022-07-05 DIAGNOSIS — F1411 Cocaine abuse, in remission: Secondary | ICD-10-CM | POA: Diagnosis not present

## 2022-07-05 DIAGNOSIS — Z8249 Family history of ischemic heart disease and other diseases of the circulatory system: Secondary | ICD-10-CM | POA: Insufficient documentation

## 2022-07-05 DIAGNOSIS — I2510 Atherosclerotic heart disease of native coronary artery without angina pectoris: Secondary | ICD-10-CM | POA: Insufficient documentation

## 2022-07-05 DIAGNOSIS — I639 Cerebral infarction, unspecified: Secondary | ICD-10-CM

## 2022-07-05 DIAGNOSIS — F17200 Nicotine dependence, unspecified, uncomplicated: Secondary | ICD-10-CM

## 2022-07-05 DIAGNOSIS — F1910 Other psychoactive substance abuse, uncomplicated: Secondary | ICD-10-CM

## 2022-07-05 DIAGNOSIS — Z833 Family history of diabetes mellitus: Secondary | ICD-10-CM | POA: Diagnosis not present

## 2022-07-05 DIAGNOSIS — F17290 Nicotine dependence, other tobacco product, uncomplicated: Secondary | ICD-10-CM | POA: Diagnosis not present

## 2022-07-05 DIAGNOSIS — I110 Hypertensive heart disease with heart failure: Secondary | ICD-10-CM | POA: Diagnosis not present

## 2022-07-05 LAB — BASIC METABOLIC PANEL
Anion gap: 10 (ref 5–15)
BUN: 11 mg/dL (ref 6–20)
CO2: 26 mmol/L (ref 22–32)
Calcium: 9 mg/dL (ref 8.9–10.3)
Chloride: 103 mmol/L (ref 98–111)
Creatinine, Ser: 1.08 mg/dL — ABNORMAL HIGH (ref 0.44–1.00)
GFR, Estimated: 60 mL/min — ABNORMAL LOW (ref >60)
Glucose, Bld: 121 mg/dL — ABNORMAL HIGH (ref 70–99)
Potassium: 4.2 mmol/L (ref 3.5–5.1)
Sodium: 139 mmol/L (ref 135–145)

## 2022-07-05 MED ORDER — ENTRESTO 97-103 MG PO TABS
1.0000 | ORAL_TABLET | Freq: Two times a day (BID) | ORAL | 6 refills | Status: DC
  Filled 2022-07-05 – 2022-07-14 (×2): qty 60, 30d supply, fill #0

## 2022-07-05 NOTE — Patient Instructions (Addendum)
Thank you for coming in today  If you had labs drawn today, any labs that are abnormal the clinic will call you No news is good news  Medications: Increase Entresto to 97/103 twice daily (it is ok to double up on 49/51 tablets)  Follow up appointments:  Your physician recommends that you return for lab work in:  BMET 2 weeks   Your physician recommends that you schedule a follow-up appointment in:  3-4 months With Dr. Earlean Shawl will receive a reminder letter in the mail a few months in advance. If you don't receive a letter, please call our office to schedule the follow-up appointment.    Do the following things EVERYDAY: Weigh yourself in the morning before breakfast. Write it down and keep it in a log. Take your medicines as prescribed Eat low salt foods--Limit salt (sodium) to 2000 mg per day.  Stay as active as you can everyday Limit all fluids for the day to less than 2 liters   At [facility], you and your health needs are our priority. As part of our continuing mission to provide you with exceptional heart care, we have created designated Provider Care Teams. These Care Teams include your primary Cardiologist (physician) and Advanced Practice Providers (APPs- Physician Assistants and Nurse Practitioners) who all work together to provide you with the care you need, when you need it.   You may see any of the following providers on your designated Care Team at your next follow up: Dr Arvilla Meres Dr Marca Ancona Dr. Marcos Eke, NP Robbie Lis, [place] [facility] [place], [place] Brynda Peon, NP Karle Plumber, PharmD   Please be sure to bring in all your medications bottles to every appointment.    Thank you for choosing [facility]  If you have any questions or concerns before your next appointment please send [place] a message through [place] or call our office at [phone].     TO LEAVE A MESSAGE FOR THE NURSE SELECT OPTION 2, PLEASE LEAVE A MESSAGE INCLUDING: YOUR NAME DATE OF BIRTH CALL BACK NUMBER REASON FOR CALL**this is important as we prioritize the call backs  YOU WILL RECEIVE A CALL BACK THE SAME DAY AS LONG AS YOU CALL BEFORE 4:00 PM

## 2022-07-05 NOTE — Progress Notes (Signed)
Advanced Heart Failure Team Note   PCP: Hoy Register, MD HF Cardiologist: Dr. Shirlee Latch  HPI: Ms Degracia is a 57 y.o. with a history of HTN, hyperlipidemia, stroke cypotogenic 2020/LINQ placement, CAD, polysubstance abuse heroin/cocaine abuse, and chronic HFrEF.    Admitted 09/2020 with NSTEMI. CT negative for PE but did show multivessel coronary calcium. Echo showed EF 45-50%, RV normal, AV moderate aortic valve stenosis. Had Cath with overlapping DES to mid RCA, 70-80% left circumflex, left main patent, mid LAD 55%. Plan for DAPT with Aspirin + Brillinta x12 months.     Admitted 11/23 with CHF. 3 months PTA she had run out of all medications. Says she didn't call for refills.   Last used cocaine 7 days prior to admission and heroin about 2 weeks prior. She was diuresed with IV lasix and GDMT titrated. She was enrolled in paramedicine program. Echo with EF 20-25%, RV okay, moderate to severe AI with possible mobile mass. TEE showed EF 20%, mildly reduced RV, bicuspid aortic valve with moderate to severe AI.  R/LHC in 11/23 showed 75% mid LCx stenosis, elevated PCWP and LVEDP but normal RV pressure, moderate pulmonary venous hypertension, CI low by thermo but preserved by Fick.  cMRI in 11/23 showed LVEF 18%, RVEF 48%, bicuspid aortic valve with severe AI, mid-wall LGE in inferolateral wall could be due to prior myocarditis but patient has CAD in LCx chronically.  Structural heart team reviewed her studies, not felt to be a candidate for TAVR.  Echo 3/24 showed EF 50-55%, normal RV, bicuspid aortic valve with moderate AS and probably moderate AI.  TEE (4/24) showed LVEF 55%, normal RV, no LAA thrombus, PFO or ASD, bicuspid AoV withfused noncoronary and right coronary cusps, mild-moderate AS with mean gradient 19 and AVA 1.57 cm^2, moderate to severe AI.  Today she returns for HF follow up. Overall feeling fine. No SOB walking or cutting her grass. She continues to work part time at [employer]. No  syncope or pre-syncope. Denies palpitations, CP, dizziness, edema, or PND/Orthopnea. Appetite ok. No fever or chills. Weight at home 127-128 pounds. Taking all medications.Smokes 2 cigars/day. Last used cocaine a month ago.   ECG (personally reviewed): none ordered today.  Labs (11/23): BNP 68, LDL 116, Lp(a) 295 Labs (1/24): K 4.2, creatinine 1.08 Labs (3/24): K 4.3, creatinine 1.03, LDL 55  PMH: 1. Hyperlipidemia 2. CVA: 2020, cryptogenic.  LINQ monitor placed.  3. Polysubstance abuse: Cocaine, heroin.  4. CAD: NSTEMI 9/22 with DES to mid RCA.  - LHC (11/23): 75% mLCx, 50% pLAD.  5. Bicuspid aortic valve disorder:  - CTA chest 9/22 with no thoracic aortic aneurysm.  - TEE (11/23): Moderate-severe AI - Echo (3/24): Moderate AS with mean gradient 18/AVA 1.04 cm^2; moderate AI.  6. Chronic systolic CHF: Nonischemic cardiomyopathy.  - RHC (11/23): mean RA 6, PA 51/29 mean 39, mean PCWP 32, CI 2.4 Fick with PVR 1.7 WU - cMRI (11/23): Moderate LV dilation with EF 18%, RV EF 48%, bicuspid aortic valve with severe AI, non-coronary LGE pattern with mid-wall LGE in the inferolateral wall (?prior myocarditis) though patient does have CAD in LCx.   - Echo (3/24): EF 50-55%, normal RV, bicuspid aortic valve with moderate AS and probably moderate AI.  - TEE (4/24): LVEF 55%, normal RV, no LAA thrombus, PFO or ASD, bicuspid AoV withfused noncoronary and right coronary cusps, mild-moderate AS with mean gradient 19 and AVA 1.57 cm^2, moderate to severe AI.  ROS: All systems negative except as listed  in HPI, PMH and Problem List.  Social History   Socioeconomic History   Marital status: Single    Spouse name: Not on file   Number of children: 3   Years of education: Not on file   Highest education level: 12th grade  Occupational History   Not on file  Tobacco Use   Smoking status: Some Days    Packs/day: 1    Types: Cigars, Cigarettes   Smokeless tobacco: Never  Vaping Use   Vaping Use:  Never used  Substance and Sexual Activity   Alcohol use: Not Currently    Comment: social drinking   Drug use: Not Currently    Types: Marijuana, Cocaine    Comment: 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   Sexual activity: Not Currently    Birth control/protection: Post-menopausal  Other Topics Concern   Not on file  Social History Narrative   Not on file   Social Determinants of Health   Financial Resource Strain: High Risk (12/14/2021)   Overall Financial Resource Strain (CARDIA)    Difficulty of Paying Living Expenses: Hard  Food Insecurity: No Food Insecurity (12/14/2021)   Hunger Vital Sign    Worried About Running Out of Food in the Last Year: Never true    Ran Out of Food in the Last Year: Never true  Transportation Needs: No Transportation Needs (12/14/2021)   PRAPARE - Administrator, Civil Service (Medical): No    Lack of Transportation (Non-Medical): No  Physical Activity: Inactive (02/21/2017)   Exercise Vital Sign    Days of Exercise per Week: 0 days    Minutes of Exercise per Session: 0 min  Stress: No Stress Concern Present (02/21/2017)   [employer] of Occupational Health - Occupational Stress Questionnaire    Feeling of Stress : Not at all  Social Connections: Somewhat Isolated (02/21/2017)   Social Connection and Isolation Panel [NHANES]    Frequency of Communication with Friends and Family: More than three times a week    Frequency of Social Gatherings with Friends and Family: Once a week    Attends Religious Services: 1 to 4 times per year    Active Member of [employer] or Organizations: No    Attends Banker Meetings: Never    Marital Status: Never married  Intimate Partner Violence: Not At Risk (11/19/2021)   Humiliation, Afraid, Rape, and Kick questionnaire    Fear of Current or Ex-Partner: No    Emotionally Abused: No    Physically Abused: No    Sexually Abused: No   FH:  Family History  Problem Relation Age of Onset    Hypertension Mother    Hypertension Father    Hypertension Brother    Stroke Sister    Past Medical History:  Diagnosis Date   Chronic systolic heart failure (HCC)    Cocaine abuse (HCC)    Dyslipidemia    Hypertension    Noncompliance w/medication treatment due to intermit use of medication    Stroke (HCC) 2021   no deficits   Current Outpatient Medications  Medication Sig Dispense Refill   atorvastatin (LIPITOR) 40 MG tablet Take 1 tablet (40 mg total) by mouth daily at 6 PM. 30 tablet 3   carvedilol (COREG) 6.25 MG tablet Take 1 tablet (6.25 mg total) by mouth 2 (two) times daily. 180 tablet 3   clopidogrel (PLAVIX) 75 MG tablet Take 1 tablet (75 mg total) by mouth daily. 30 tablet 1   empagliflozin (  JARDIANCE) 10 MG TABS tablet Take 1 tablet (10 mg total) by mouth daily. 30 tablet 1   ezetimibe (ZETIA) 10 MG tablet Take 1 tablet (10 mg total) by mouth daily. 30 tablet 3   sacubitril-valsartan (ENTRESTO) 49-51 MG Take 1 tablet by mouth 2 (two) times daily. 180 tablet 3   spironolactone (ALDACTONE) 25 MG tablet Take 1 tablet (25 mg total) by mouth daily. 30 tablet 1   No current facility-administered medications for this encounter.   BP (!) 148/82   Pulse 74   Ht 5\' 4"  (1.626 m)   Wt 58.5 kg (129 lb)   LMP  (LMP Unknown) Comment: Pt unsure why she no longer gets her period  SpO2 100%   BMI 22.14 kg/m   Wt Readings from Last 3 Encounters:  07/05/22 58.5 kg (129 lb)  06/29/22 58 kg (127 lb 12.8 oz)  06/15/22 57.9 kg (127 lb 9.6 oz)   PHYSICAL EXAM: General:  NAD. No resp difficulty, walked into clinic HEENT: Normal Neck: Supple. No JVD. Carotids 2+ bilat; no bruits. No lymphadenopathy or thryomegaly appreciated. Cor: PMI nondisplaced. Regular rate & rhythm. No rubs, gallops, 2/6 SEM RUSB, faint S2 Lungs: Clear Abdomen: Soft, nontender, nondistended. No hepatosplenomegaly. No bruits or masses. Good bowel sounds. Extremities: No cyanosis, clubbing, rash, edema Neuro:  Alert & oriented x 3, cranial nerves grossly intact. Moves all 4 extremities w/o difficulty. Affect pleasant.  ASSESSMENT & PLAN:  1. Chronic systolic CHF: Nonischemic CMP.  Echo 11/23 with EF 20-25%, global hypokinesis, moderate LV dilation, normal RV, mod-severe AI.  RHC showed significantly elevated PCWP and LVEDP but normal RA pressure; CI 2.4 Fick/2.0 thermo.  Cause of cardiomyopathy uncertain, CAD does not appear to have progressed (75% mLCx stenosis on 11/23 cath).  Possibly some component of substance abuse (cocaine) though by cardiac MRI in 11/23, prior myocarditis is also a concern (non-coronary LGE noted).  LV EF 18% on cMRI with relatively preserved RV function.  Aortic insufficiency also likely plays a role (moderate to severe on TEE in 11/23).  Repeat echo 3/24 showed improvement with EF 50-55%, normal RV, bicuspid aortic valve with moderate AS and moderate AI.  Improvement in EF suggests a role for cocaine in her cardiomyopathy (she had quit using drugs).  She is not volume overloaded on exam, NYHA class I-II.  - Increase Entresto to 97/103 mg bid. BMET today, repeat in 10-14 days. - Continue Coreg 6.25 mg bid.    - Continue Jardiance 10 mg daily. - Continue spironolactone 25 mg daily.  2. CAD: DES to RCA in 9/22.  Cath in 11/23 with patent RCA stent, 50% proximal LAD, 75% mid LCx.  The LCx stenosis appeared similar to the prior cath. No chest pain. Lp(a) markedly elevated with age-advanced CAD.  - Continue Plavix 75 daily.  - Continue atorvastatin, good lipids 3/24.  - With elevated Lp(a), will need to be aggressive with lipid management.  [facility], unfortunately she does not feel she could self-inject medication and insurance coverage will be difficult for PCSK9 inhibitor as her LDL is 55. She is considering enrolling in clinical trial for lepodisiran. 3. Aortic valve disorder: Moderate-severe AI on 11/23 echo and TEE. TEE showed bicuspid aortic valve  with fusion of right and noncoronary cusps.  Moderate-severe AI by TEE.  By cardiac MRI, aortic valve regurgitant fraction was 38% which suggests severe AI.  Echo 3/24 showed probably moderate AI and moderate AS. With improvement in LV  function, she would be SAVR candidate if aortic insufficiency remains severe. TEE (4/24) showed mild-moderate AS with mean gradient 19 and AVA 1.57 cm^2, moderate to severe AI. She is unsure if she would be willing to undergo SAVR. - Not a candidate for TAVR, was evaluated by structural heart team.  - Recommended screening in 1st degree relatives for bicuspid aortic valve 4. Polysubstance abuse: Cocaine/heroin.  She reported abstinence and EF has improved.  - Used cocaine a month ago. Discussed importance of cessation. 5. H/o CVA: She is on Plavix.  6. Smoking: Smokes 1 cigar/day. I strongly encouraged her to quit totally.  7. SDOH: Continue paramedicine, appreciate their assistance.  Follow up in 3 months with Dr. Shirlee Latch. She was given a note for her employer today.  Anderson Malta [facility] FNP-BC 07/05/2022

## 2022-07-05 NOTE — Telephone Encounter (Signed)
Reached out to pt in regards to entresto being increased today at clinic to see if she is avail for home visit to assist in those changes.  No answer.  I did LVM for her to return my call.   Kerry Hough, EMT-Paramedic  [phone] 07/05/2022

## 2022-07-12 ENCOUNTER — Other Ambulatory Visit: Payer: Self-pay

## 2022-07-14 ENCOUNTER — Other Ambulatory Visit (HOSPITAL_COMMUNITY): Payer: Self-pay | Admitting: Emergency Medicine

## 2022-07-14 ENCOUNTER — Other Ambulatory Visit: Payer: Self-pay

## 2022-07-14 NOTE — Progress Notes (Signed)
Med rec only x 2 weeks  Refills needed: Spironolactone, Zetia, Atorvastatin, Clopidogrel- called in    Beatrix Shipper, EMT-Paramedic [phone] 07/15/2022

## 2022-07-15 ENCOUNTER — Telehealth (HOSPITAL_COMMUNITY): Payer: Self-pay | Admitting: Emergency Medicine

## 2022-07-15 NOTE — Telephone Encounter (Signed)
Sent reminder of Lab appointment 7/2 @ 10:00 at [facility].    Beatrix Shipper, EMT-Paramedic [phone] 07/15/2022

## 2022-07-20 ENCOUNTER — Other Ambulatory Visit (HOSPITAL_COMMUNITY): Payer: Medicaid Other

## 2022-07-22 ENCOUNTER — Encounter: Payer: Self-pay | Admitting: Family Medicine

## 2022-07-26 ENCOUNTER — Telehealth (HOSPITAL_COMMUNITY): Payer: Self-pay | Admitting: Emergency Medicine

## 2022-07-26 NOTE — Telephone Encounter (Signed)
Spoke with Leslie Walker to remind her of Lab appointment tomorrow at [facility] @ 1:45.  I advised her I would follow her lab results for any changes and see her tomorrow afternoon for med revision if necessary. She report to be feeling well and took a trip to the beach this past weekend with family.    Beatrix Shipper, EMT-Paramedic [phone] 07/26/2022

## 2022-07-27 ENCOUNTER — Ambulatory Visit (HOSPITAL_COMMUNITY)
Admission: RE | Admit: 2022-07-27 | Discharge: 2022-07-27 | Disposition: A | Discharge status: Home / Self Care | Payer: Medicaid Other | Source: Ambulatory Visit | Attending: Internal Medicine | Admitting: Internal Medicine

## 2022-07-27 ENCOUNTER — Other Ambulatory Visit (HOSPITAL_COMMUNITY): Payer: Self-pay | Admitting: Emergency Medicine

## 2022-07-27 ENCOUNTER — Other Ambulatory Visit: Payer: Self-pay

## 2022-07-27 DIAGNOSIS — I5022 Chronic systolic (congestive) heart failure: Secondary | ICD-10-CM | POA: Diagnosis not present

## 2022-07-27 LAB — BASIC METABOLIC PANEL
Anion gap: 8 (ref 5–15)
BUN: 12 mg/dL (ref 6–20)
CO2: 25 mmol/L (ref 22–32)
Calcium: 8.9 mg/dL (ref 8.9–10.3)
Chloride: 105 mmol/L (ref 98–111)
Creatinine, Ser: 0.97 mg/dL (ref 0.44–1.00)
GFR, Estimated: >60 mL/min (ref >60)
Glucose, Bld: 129 mg/dL — ABNORMAL HIGH (ref 70–99)
Potassium: 4.3 mmol/L (ref 3.5–5.1)
Sodium: 138 mmol/L (ref 135–145)

## 2022-07-27 NOTE — Progress Notes (Unsigned)
Paramedicine Encounter    Patient ID: Leslie Walker, female    DOB: [birth year], 57 y.o.   MRN: [id number]   Complaints***  Assessment***  Compliance with meds***  Pill box filled***  Refills needed***  Meds changes since last visit***    Social changes***   LMP  (LMP Unknown) Comment: Pt unsure why she no longer gets her period Weight yesterday-not talem Last visit weight-129lb  ACTION: {Paramed [phone]}  Beatrix Shipper, EMT-Paramedic [phone] 07/27/22  Patient Care Team: Hoy Register, MD as PCP - General (Family Medicine) Chrystie Nose, MD as PCP - Cardiology (Cardiology)  Patient Active Problem List   Diagnosis Date Noted   Nonrheumatic aortic valve insufficiency 04/19/2022   Coronary artery disease 11/20/2021   Elevated troponin 11/19/2021   Heart failure (HCC) 11/19/2021   Acute on chronic systolic heart failure (HCC) 11/18/2021   Chronic systolic heart failure (HCC)    Chest pain, precordial 10/09/2020   Cocaine use 10/09/2020   NSTEMI (non-ST elevated myocardial infarction) (HCC) 10/09/2020   Polysubstance abuse (HCC) 10/09/2020   Hyperlipidemia 10/09/2020   Lung nodule 10/09/2020   Tobacco dependence 10/09/2020   Screening breast examination 09/04/2018   Heroin dependence (HCC) 06/29/2018   TIA (transient ischemic attack) 06/24/2018   Nail fungus 04/20/2015   Porokeratosis 04/20/2015   History of ulceration 04/20/2015   Foot ulcer (HCC) 02/17/2014   Cellulitis of foot, right 02/17/2014   Chronic pain syndrome 02/17/2014   Essential hypertension 02/17/2014    Current Outpatient Medications:    atorvastatin (LIPITOR) 40 MG tablet, Take 1 tablet (40 mg total) by mouth daily at 6 PM., Disp: 30 tablet, Rfl: 3   carvedilol (COREG) 6.25 MG tablet, Take 1 tablet (6.25 mg total) by mouth 2 (two) times daily., Disp: 180 tablet, Rfl: 3   clopidogrel (PLAVIX) 75 MG tablet, Take 1 tablet (75 mg total) by mouth daily., Disp: 30 tablet, Rfl:  1   empagliflozin (JARDIANCE) 10 MG TABS tablet, Take 1 tablet (10 mg total) by mouth daily., Disp: 30 tablet, Rfl: 1   ezetimibe (ZETIA) 10 MG tablet, Take 1 tablet (10 mg total) by mouth daily., Disp: 30 tablet, Rfl: 3   sacubitril-valsartan (ENTRESTO) 97-103 MG, Take 1 tablet by mouth 2 (two) times daily., Disp: 60 tablet, Rfl: 6   spironolactone (ALDACTONE) 25 MG tablet, Take 1 tablet (25 mg total) by mouth daily., Disp: 30 tablet, Rfl: 1 No Known Allergies   Social History   Socioeconomic History   Marital status: Single    Spouse name: Not on file   Number of children: 3   Years of education: Not on file   Highest education level: 12th grade  Occupational History   Not on file  Tobacco Use   Smoking status: Some Days    Packs/day: 1    Types: Cigars, Cigarettes   Smokeless tobacco: Never  Vaping Use   Vaping Use: Never used  Substance and Sexual Activity   Alcohol use: Not Currently    Comment: social drinking   Drug use: Not Currently    Types: Marijuana, Cocaine    Comment: 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   Sexual activity: Not Currently    Birth control/protection: Post-menopausal  Other Topics Concern   Not on file  Social History Narrative   Not on file   Social Determinants of Health   Financial Resource Strain: High Risk (12/14/2021)   Overall Financial Resource Strain (CARDIA)    Difficulty of Paying Living Expenses: Hard  Food Insecurity: No Food Insecurity (12/14/2021)   Hunger Vital Sign    Worried About Running Out of Food in the Last Year: Never true    Ran Out of Food in the Last Year: Never true  Transportation Needs: No Transportation Needs (12/14/2021)   PRAPARE - Administrator, Civil Service (Medical): No    Lack of Transportation (Non-Medical): No  Physical Activity: Inactive (02/21/2017)   Exercise Vital Sign    Days of Exercise per Week: 0 days    Minutes of Exercise per Session: 0 min  Stress: No Stress Concern Present  (02/21/2017)   [employer] of Occupational Health - Occupational Stress Questionnaire    Feeling of Stress : Not at all  Social Connections: Somewhat Isolated (02/21/2017)   Social Connection and Isolation Panel [NHANES]    Frequency of Communication with Friends and Family: More than three times a week    Frequency of Social Gatherings with Friends and Family: Once a week    Attends Religious Services: 1 to 4 times per year    Active Member of [employer] or Organizations: No    Attends Banker Meetings: Never    Marital Status: Never married  Intimate Partner Violence: Not At Risk (11/19/2021)   Humiliation, Afraid, Rape, and Kick questionnaire    Fear of Current or Ex-Partner: No    Emotionally Abused: No    Physically Abused: No    Sexually Abused: No    Physical Exam      Future Appointments  Date Time Provider Department Center  11/08/2022  1:30 PM Hoy Register, MD [facility] None

## 2022-07-28 ENCOUNTER — Other Ambulatory Visit: Payer: Self-pay

## 2022-07-29 ENCOUNTER — Other Ambulatory Visit: Payer: Self-pay

## 2022-07-29 ENCOUNTER — Other Ambulatory Visit (HOSPITAL_COMMUNITY): Payer: Self-pay | Admitting: Emergency Medicine

## 2022-07-29 NOTE — Progress Notes (Signed)
Picked up Ms. Ostrander's Zetia and delivered to her home.  Pills placed in Mon/Tues/Wed of week 1 regimen and added Sun-Sat to week 2 w/o incident.    Beatrix Shipper, EMT-Paramedic [phone] 07/29/2022

## 2022-08-10 ENCOUNTER — Other Ambulatory Visit (HOSPITAL_COMMUNITY): Payer: Self-pay | Admitting: Emergency Medicine

## 2022-08-10 ENCOUNTER — Other Ambulatory Visit: Payer: Self-pay

## 2022-08-10 ENCOUNTER — Telehealth (HOSPITAL_COMMUNITY): Payer: Self-pay | Admitting: Cardiology

## 2022-08-10 ENCOUNTER — Telehealth (HOSPITAL_COMMUNITY): Payer: Self-pay | Admitting: Family Medicine

## 2022-08-10 MED ORDER — ENTRESTO 24-26 MG PO TABS
1.0000 | ORAL_TABLET | Freq: Two times a day (BID) | ORAL | 3 refills | Status: DC
  Filled 2022-08-10: qty 60, 30d supply, fill #0

## 2022-08-10 MED ORDER — EZETIMIBE 10 MG PO TABS
10.0000 mg | ORAL_TABLET | Freq: Every day | ORAL | 3 refills | Status: DC
  Filled 2022-08-10 – 2022-08-24 (×3): qty 30, 30d supply, fill #0
  Filled 2022-09-28: qty 30, 30d supply, fill #1
  Filled 2022-11-01: qty 30, 30d supply, fill #2
  Filled 2022-12-07: qty 30, 30d supply, fill #3

## 2022-08-10 MED ORDER — SPIRONOLACTONE 25 MG PO TABS
25.0000 mg | ORAL_TABLET | Freq: Every day | ORAL | 3 refills | Status: DC
  Filled 2022-08-10 – 2022-08-24 (×3): qty 30, 30d supply, fill #0
  Filled 2022-09-28: qty 30, 30d supply, fill #1
  Filled 2022-11-01: qty 30, 30d supply, fill #2
  Filled 2022-12-07: qty 30, 30d supply, fill #3

## 2022-08-10 NOTE — Telephone Encounter (Signed)
-----   Message from CMA Dede S sent at 08/10/2022 11:24 AM EDT ----- Regarding: refills Need refills for Spironolactone 25mg . And Ezetimibe 10mg  called into [facility]  [phone]  Thanks,    Beatrix Shipper, EMT-Paramedic [phone] 08/10/2022

## 2022-08-10 NOTE — Telephone Encounter (Signed)
Notified by Dr. Sherle Poe with med management that sBP 90s and she feels poorly.   Will decrease Entresto to 24/26 mg bid, will notify patient and DeDe with paramedicine.

## 2022-08-10 NOTE — Progress Notes (Unsigned)
Paramedicine Encounter    Patient ID: Leslie Walker, female    DOB: [birth year], 57 y.o.   MRN: [id number]   Complaints None  Assessment***  Compliance with meds 100%  Pill box filled x 2 weeks  Refills needed Zetia, [facility]  Meds changes since last visit NONE    Social changes NONE   LMP  (LMP Unknown) Comment: Pt unsure why she no longer gets her period Weight yesterday- not taken Last visit weight-128lb   ATF  ACTION: Home visit completed  [name] [phone] 08/10/22  Patient Care Team: Hoy Register, MD as PCP - General (Family Medicine) Chrystie Nose, MD as PCP - Cardiology (Cardiology)  Patient Active Problem List   Diagnosis Date Noted   Nonrheumatic aortic valve insufficiency 04/19/2022   Coronary artery disease 11/20/2021   Elevated troponin 11/19/2021   Heart failure (HCC) 11/19/2021   Acute on chronic systolic heart failure (HCC) 11/18/2021   Chronic systolic heart failure (HCC)    Chest pain, precordial 10/09/2020   Cocaine use 10/09/2020   NSTEMI (non-ST elevated myocardial infarction) (HCC) 10/09/2020   Polysubstance abuse (HCC) 10/09/2020   Hyperlipidemia 10/09/2020   Lung nodule 10/09/2020   Tobacco dependence 10/09/2020   Screening breast examination 09/04/2018   Heroin dependence (HCC) 06/29/2018   TIA (transient ischemic attack) 06/24/2018   Nail fungus 04/20/2015   Porokeratosis 04/20/2015   History of ulceration 04/20/2015   Foot ulcer (HCC) 02/17/2014   Cellulitis of foot, right 02/17/2014   Chronic pain syndrome 02/17/2014   Essential hypertension 02/17/2014    Current Outpatient Medications:    atorvastatin (LIPITOR) 40 MG tablet, Take 1 tablet (40 mg total) by mouth daily at 6 PM., Disp: 30 tablet, Rfl: 3   carvedilol (COREG) 6.25 MG tablet, Take 1 tablet (6.25 mg total) by mouth 2 (two) times daily., Disp: 180 tablet, Rfl: 3   clopidogrel (PLAVIX) 75 MG tablet, Take 1 tablet (75 mg total) by  mouth daily., Disp: 30 tablet, Rfl: 1   empagliflozin (JARDIANCE) 10 MG TABS tablet, Take 1 tablet (10 mg total) by mouth daily., Disp: 30 tablet, Rfl: 1   ezetimibe (ZETIA) 10 MG tablet, Take 1 tablet (10 mg total) by mouth daily., Disp: 30 tablet, Rfl: 3   sacubitril-valsartan (ENTRESTO) 97-103 MG, Take 1 tablet by mouth 2 (two) times daily., Disp: 60 tablet, Rfl: 6   spironolactone (ALDACTONE) 25 MG tablet, Take 1 tablet (25 mg total) by mouth daily., Disp: 30 tablet, Rfl: 1 No Known Allergies   Social History   Socioeconomic History   Marital status: Single    Spouse name: Not on file   Number of children: 3   Years of education: Not on file   Highest education level: 12th grade  Occupational History   Not on file  Tobacco Use   Smoking status: Some Days    Current packs/day: 1.00    Types: Cigars, Cigarettes   Smokeless tobacco: Never  Vaping Use   Vaping status: Never Used  Substance and Sexual Activity   Alcohol use: Not Currently    Comment: social drinking   Drug use: Not Currently    Types: Marijuana, Cocaine    Comment: 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   Sexual activity: Not Currently    Birth control/protection: Post-menopausal  Other Topics Concern   Not on file  Social History Narrative   Not on file   Social Determinants of Health   Financial Resource Strain: High Risk (12/14/2021)  Overall Financial Resource Strain (CARDIA)    Difficulty of Paying Living Expenses: Hard  Food Insecurity: No Food Insecurity (12/14/2021)   Hunger Vital Sign    Worried About Running Out of Food in the Last Year: Never true    Ran Out of Food in the Last Year: Never true  Transportation Needs: No Transportation Needs (12/14/2021)   PRAPARE - Administrator, Civil Service (Medical): No    Lack of Transportation (Non-Medical): No  Physical Activity: Inactive (02/21/2017)   Exercise Vital Sign    Days of Exercise per Week: 0 days    Minutes of Exercise per  Session: 0 min  Stress: No Stress Concern Present (02/21/2017)   [employer] of Occupational Health - Occupational Stress Questionnaire    Feeling of Stress : Not at all  Social Connections: Somewhat Isolated (02/21/2017)   Social Connection and Isolation Panel [NHANES]    Frequency of Communication with Friends and Family: More than three times a week    Frequency of Social Gatherings with Friends and Family: Once a week    Attends Religious Services: 1 to 4 times per year    Active Member of [employer] or Organizations: No    Attends Banker Meetings: Never    Marital Status: Never married  Intimate Partner Violence: Not At Risk (11/19/2021)   Humiliation, Afraid, Rape, and Kick questionnaire    Fear of Current or Ex-Partner: No    Emotionally Abused: No    Physically Abused: No    Sexually Abused: No    Physical Exam      Future Appointments  Date Time Provider Department Center  11/08/2022  1:30 PM Hoy Register, MD [facility] None

## 2022-08-10 NOTE — Addendum Note (Signed)
Addended by: Bea Laura B on: 08/10/2022 02:11 PM   Modules accepted: Orders

## 2022-08-10 NOTE — Telephone Encounter (Signed)
Called and spoke with patient and made her aware of the below- patient states she will go and pick up her new prescription today for Entresto 24/26 BID. Advised her to let us know if any concerns.   Staff message sent to DeDe with Paramedicine.

## 2022-08-10 NOTE — Telephone Encounter (Signed)
Refills sent

## 2022-08-24 ENCOUNTER — Other Ambulatory Visit (HOSPITAL_BASED_OUTPATIENT_CLINIC_OR_DEPARTMENT_OTHER): Payer: Self-pay

## 2022-08-24 ENCOUNTER — Other Ambulatory Visit (HOSPITAL_COMMUNITY): Payer: Self-pay | Admitting: Emergency Medicine

## 2022-08-24 ENCOUNTER — Other Ambulatory Visit (HOSPITAL_COMMUNITY): Payer: Self-pay

## 2022-08-24 ENCOUNTER — Other Ambulatory Visit: Payer: Self-pay

## 2022-08-24 ENCOUNTER — Other Ambulatory Visit (HOSPITAL_COMMUNITY): Payer: Self-pay | Admitting: Cardiology

## 2022-08-24 DIAGNOSIS — I2510 Atherosclerotic heart disease of native coronary artery without angina pectoris: Secondary | ICD-10-CM

## 2022-08-24 MED ORDER — CLOPIDOGREL BISULFATE 75 MG PO TABS
75.0000 mg | ORAL_TABLET | Freq: Every day | ORAL | 1 refills | Status: DC
Start: 2022-08-24 — End: 2022-11-08
  Filled 2022-08-24: qty 30, 30d supply, fill #0
  Filled 2022-09-28: qty 30, 30d supply, fill #1

## 2022-08-24 NOTE — Progress Notes (Unsigned)
Paramedicine Encounter    Patient ID: Leslie Walker, female    DOB: 11-Aug-1965, 57 y.o.   MRN: [id number]   Complaints NONE  Assessment A&O x 4, skin W&D w/ good color.    Compliance with meds Missed 4 p.m. doses and 1 a.m. dose  Pill box filled x 2 weeks  Refills needed Clopidogrel, Carvedilol, Atorvastatin, Spironolactone , Ezetimibe, Entreso  Meds changes since last visit:  None    Social changes NONE   LMP  (LMP Unknown) Comment: Pt unsure why she no longer gets her period Weight yesterday-Not taken Last visit weight-129lb  ACTION: {Paramed [phone]}  Beatrix Shipper, EMT-Paramedic [phone] 08/24/22  Patient Care Team: Hoy Register, MD as PCP - General (Family Medicine) Chrystie Nose, MD as PCP - Cardiology (Cardiology)  Patient Active Problem List   Diagnosis Date Noted   Nonrheumatic aortic valve insufficiency 04/19/2022   Coronary artery disease 11/20/2021   Elevated troponin 11/19/2021   Heart failure (HCC) 11/19/2021   Acute on chronic systolic heart failure (HCC) 11/18/2021   Chronic systolic heart failure (HCC)    Chest pain, precordial 10/09/2020   Cocaine use 10/09/2020   NSTEMI (non-ST elevated myocardial infarction) (HCC) 10/09/2020   Polysubstance abuse (HCC) 10/09/2020   Hyperlipidemia 10/09/2020   Lung nodule 10/09/2020   Tobacco dependence 10/09/2020   Screening breast examination 09/04/2018   Heroin dependence (HCC) 06/29/2018   TIA (transient ischemic attack) 06/24/2018   Nail fungus 04/20/2015   Porokeratosis 04/20/2015   History of ulceration 04/20/2015   Foot ulcer (HCC) 02/17/2014   Cellulitis of foot, right 02/17/2014   Chronic pain syndrome 02/17/2014   Essential hypertension 02/17/2014    Current Outpatient Medications:    atorvastatin (LIPITOR) 40 MG tablet, Take 1 tablet (40 mg total) by mouth daily at 6 PM., Disp: 30 tablet, Rfl: 3   carvedilol (COREG) 6.25 MG tablet, Take 1 tablet (6.25 mg total) by mouth 2  (two) times daily., Disp: 180 tablet, Rfl: 3   clopidogrel (PLAVIX) 75 MG tablet, Take 1 tablet (75 mg total) by mouth daily., Disp: 30 tablet, Rfl: 1   empagliflozin (JARDIANCE) 10 MG TABS tablet, Take 1 tablet (10 mg total) by mouth daily., Disp: 30 tablet, Rfl: 1   ezetimibe (ZETIA) 10 MG tablet, Take 1 tablet (10 mg total) by mouth daily., Disp: 30 tablet, Rfl: 3   sacubitril-valsartan (ENTRESTO) 24-26 MG, Take 1 tablet by mouth 2 (two) times daily., Disp: 60 tablet, Rfl: 3   spironolactone (ALDACTONE) 25 MG tablet, Take 1 tablet (25 mg total) by mouth daily., Disp: 30 tablet, Rfl: 3 No Known Allergies   Social History   Socioeconomic History   Marital status: Single    Spouse name: Not on file   Number of children: 3   Years of education: Not on file   Highest education level: 12th grade  Occupational History   Not on file  Tobacco Use   Smoking status: Some Days    Current packs/day: 1.00    Types: Cigars, Cigarettes   Smokeless tobacco: Never  Vaping Use   Vaping status: Never Used  Substance and Sexual Activity   Alcohol use: Not Currently    Comment: social drinking   Drug use: Not Currently    Types: Marijuana, Cocaine    Comment: 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   Sexual activity: Not Currently    Birth control/protection: Post-menopausal  Other Topics Concern   Not on file  Social History Narrative   Not  on file   Social Determinants of Health   Financial Resource Strain: High Risk (12/14/2021)   Overall Financial Resource Strain (CARDIA)    Difficulty of Paying Living Expenses: Hard  Food Insecurity: No Food Insecurity (12/14/2021)   Hunger Vital Sign    Worried About Running Out of Food in the Last Year: Never true    Ran Out of Food in the Last Year: Never true  Transportation Needs: No Transportation Needs (12/14/2021)   PRAPARE - Administrator, Civil Service (Medical): No    Lack of Transportation (Non-Medical): No  Physical Activity:  Inactive (02/21/2017)   Exercise Vital Sign    Days of Exercise per Week: 0 days    Minutes of Exercise per Session: 0 min  Stress: No Stress Concern Present (02/21/2017)   [employer] of Occupational Health - Occupational Stress Questionnaire    Feeling of Stress : Not at all  Social Connections: Somewhat Isolated (02/21/2017)   Social Connection and Isolation Panel [NHANES]    Frequency of Communication with Friends and Family: More than three times a week    Frequency of Social Gatherings with Friends and Family: Once a week    Attends Religious Services: 1 to 4 times per year    Active Member of [employer] or Organizations: No    Attends Banker Meetings: Never    Marital Status: Never married  Intimate Partner Violence: Not At Risk (11/19/2021)   Humiliation, Afraid, Rape, and Kick questionnaire    Fear of Current or Ex-Partner: No    Emotionally Abused: No    Physically Abused: No    Sexually Abused: No    Physical Exam      Future Appointments  Date Time Provider Department Center  11/08/2022  1:30 PM Hoy Register, MD [facility] None

## 2022-08-25 ENCOUNTER — Other Ambulatory Visit: Payer: Self-pay

## 2022-09-14 ENCOUNTER — Telehealth (HOSPITAL_COMMUNITY): Payer: Self-pay

## 2022-09-14 ENCOUNTER — Other Ambulatory Visit (HOSPITAL_COMMUNITY): Payer: Self-pay | Admitting: Emergency Medicine

## 2022-09-14 MED ORDER — ENTRESTO 24-26 MG PO TABS: 1.0000 | ORAL_TABLET | Freq: Two times a day (BID) | ORAL | 3 refills | Status: DC

## 2022-09-14 NOTE — Progress Notes (Unsigned)
Paramedicine Encounter    Patient ID: Leslie Walker, female    DOB: 18-May-1965, 57 y.o.   MRN: [id number]     Complaints NONE  Assessment A&O x 4. Skin W&D w/ good color.  Lung sounds clear throughout.  No edema noted.  Compliance with meds YES  Pill box filled x 1 week  Refills needed Entresto 24-26 from [facility]  Meds changes since last visit NONE    Social changes NONE  Home visit with Leslie Walker finds her doing well.  She has no complaints of chest pain or SOB.  No edema noted.  She advised me that she tried to get her Sherryll Burger 24-26 refilled from [facility] but needs a precription for this new dose.  Leslie Walker contacted HF Triage for new prescription to be sent to mail order.  In the meantime I will p/u samples from clinic until new prescription can arrive by mail.   LMP  (LMP Unknown) Comment: Pt unsure why she no longer gets her period Weight yesterday-not taken Last visit weight-134lb   ACTION: Home visit completed  Leslie Walker [phone] 09/14/22  Patient Care Team: Hoy Register, MD as PCP - General (Family Medicine) Chrystie Nose, MD as PCP - Cardiology (Cardiology)  Patient Active Problem List   Diagnosis Date Noted   Nonrheumatic aortic valve insufficiency 04/19/2022   Coronary artery disease 11/20/2021   Elevated troponin 11/19/2021   Heart failure (HCC) 11/19/2021   Acute on chronic systolic heart failure (HCC) 11/18/2021   Chronic systolic heart failure (HCC)    Chest pain, precordial 10/09/2020   Cocaine use 10/09/2020   NSTEMI (non-ST elevated myocardial infarction) (HCC) 10/09/2020   Polysubstance abuse (HCC) 10/09/2020   Hyperlipidemia 10/09/2020   Lung nodule 10/09/2020   Tobacco dependence 10/09/2020   Screening breast examination 09/04/2018   Heroin dependence (HCC) 06/29/2018   TIA (transient ischemic attack) 06/24/2018   Nail fungus 04/20/2015   Porokeratosis 04/20/2015   History of ulceration 04/20/2015    Foot ulcer (HCC) 02/17/2014   Cellulitis of foot, right 02/17/2014   Chronic pain syndrome 02/17/2014   Essential hypertension 02/17/2014    Current Outpatient Medications:    atorvastatin (LIPITOR) 40 MG tablet, Take 1 tablet (40 mg total) by mouth daily at 6 PM., Disp: 30 tablet, Rfl: 3   carvedilol (COREG) 6.25 MG tablet, Take 1 tablet (6.25 mg total) by mouth 2 (two) times daily., Disp: 180 tablet, Rfl: 3   clopidogrel (PLAVIX) 75 MG tablet, Take 1 tablet (75 mg total) by mouth daily., Disp: 30 tablet, Rfl: 1   empagliflozin (JARDIANCE) 10 MG TABS tablet, Take 1 tablet (10 mg total) by mouth daily., Disp: 30 tablet, Rfl: 1   ezetimibe (ZETIA) 10 MG tablet, Take 1 tablet (10 mg total) by mouth daily., Disp: 30 tablet, Rfl: 3   sacubitril-valsartan (ENTRESTO) 24-26 MG, Take 1 tablet by mouth 2 (two) times daily., Disp: 60 tablet, Rfl: 3   spironolactone (ALDACTONE) 25 MG tablet, Take 1 tablet (25 mg total) by mouth daily., Disp: 30 tablet, Rfl: 3 No Known Allergies   Social History   Socioeconomic History   Marital status: Single    Spouse name: Not on file   Number of children: 3   Years of education: Not on file   Highest education level: 12th grade  Occupational History   Not on file  Tobacco Use   Smoking status: Some Days    Current packs/day: 1.00    Types: Cigars, Cigarettes  Smokeless tobacco: Never  Vaping Use   Vaping status: Never Used  Substance and Sexual Activity   Alcohol use: Not Currently    Comment: social drinking   Drug use: Not Currently    Types: Marijuana, Cocaine    Comment: 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   Sexual activity: Not Currently    Birth control/protection: Post-menopausal  Other Topics Concern   Not on file  Social History Narrative   Not on file   Social Determinants of Health   Financial Resource Strain: High Risk (12/14/2021)   Overall Financial Resource Strain (CARDIA)    Difficulty of Paying Living Expenses: Hard  Food  Insecurity: No Food Insecurity (12/14/2021)   Hunger Vital Sign    Worried About Running Out of Food in the Last Year: Never true    Ran Out of Food in the Last Year: Never true  Transportation Needs: No Transportation Needs (12/14/2021)   PRAPARE - Administrator, Civil Service (Medical): No    Lack of Transportation (Non-Medical): No  Physical Activity: Inactive (02/21/2017)   Exercise Vital Sign    Days of Exercise per Week: 0 days    Minutes of Exercise per Session: 0 min  Stress: No Stress Concern Present (02/21/2017)   [employer] of Occupational Health - Occupational Stress Questionnaire    Feeling of Stress : Not at all  Social Connections: Somewhat Isolated (02/21/2017)   Social Connection and Isolation Panel [NHANES]    Frequency of Communication with Friends and Family: More than three times a week    Frequency of Social Gatherings with Friends and Family: Once a week    Attends Religious Services: 1 to 4 times per year    Active Member of [employer] or Organizations: No    Attends Banker Meetings: Never    Marital Status: Never married  Intimate Partner Violence: Not At Risk (11/19/2021)   Humiliation, Afraid, Rape, and Kick questionnaire    Fear of Current or Ex-Partner: No    Emotionally Abused: No    Physically Abused: No    Sexually Abused: No    Physical Exam      Future Appointments  Date Time Provider Department Center  11/08/2022  1:30 PM Hoy Register, MD [facility] None

## 2022-09-14 NOTE — Telephone Encounter (Signed)
Confirmed with pharmacy team-  Updated script sent vs refills through 800 number    Updated script sent to pharmacy

## 2022-09-14 NOTE — Telephone Encounter (Signed)
Given to DeDe, paramedcine for patient  Medication Samples have been provided to the patient.  Drug name: Sherryll Burger       Strength: 24-26        Qty: 28  LOT: [id number]  Exp.Date: August 2025  Dosing instructions: Take 1 tablet by mouth twice a day.  The patient has been instructed regarding the correct time, dose, and frequency of taking this medication, including desired effects and most common side effects.   Novella Rob Katanya Schlie 12:20 PM 09/14/2022

## 2022-09-14 NOTE — Addendum Note (Signed)
Addended by: Theresia Bough on: 09/14/2022 10:24 AM   Modules accepted: Orders

## 2022-09-14 NOTE — Telephone Encounter (Signed)
Veronia made Dede aware today that she needs her Entresto 24/26 in the mail. Please send Sherryll Burger to [facility] for her to receive through patient assistance.   Thanks!   [name], EMT-Paramedic [phone] 09/14/2022

## 2022-09-16 ENCOUNTER — Other Ambulatory Visit (HOSPITAL_COMMUNITY): Payer: Self-pay | Admitting: Emergency Medicine

## 2022-09-16 NOTE — Progress Notes (Addendum)
Delivered sample bottle of Entresto 24-26 from [facility] @ 11:45 a.m. Pending the arrival of her new dosage via mail order.    Beatrix Shipper, EMT-Paramedic [phone] 09/16/2022

## 2022-09-28 ENCOUNTER — Other Ambulatory Visit (HOSPITAL_COMMUNITY): Payer: Self-pay | Admitting: Emergency Medicine

## 2022-09-28 ENCOUNTER — Other Ambulatory Visit: Payer: Self-pay

## 2022-09-28 NOTE — Progress Notes (Unsigned)
Paramedicine Encounter    Patient ID: Leslie Walker, female    DOB: 12-04-1965, 57 y.o.   MRN: [id number]   Complaints NONE  Assessment A&O x 4, skin W&D w/ good color.  Lung sounds clear throughout and no peripheral edema noted.  Compliance with meds YES  Pill box filled x 2 weeks.    Refills needed Clopidogrel, Zetia, Spiro  Meds changes since last visit NONE    Social changes NONE   BP (!) 158/80   Pulse 60   Wt 133 lb (60.3 kg)   LMP  (LMP Unknown) Comment: Pt unsure why she no longer gets her period  SpO2 98%   BMI 22.83 kg/m  Weight yesterday- NOT TAKEN Last visit weight-134lb  ATF Ms. Kulczyk w/o complaint of chest pain or SOB.  She is doing well w/ med compliance.  Med box reconciled x 2 weeks.  Refills ordered from [facility].  Next pending appointment  w/ Dr. Alvis Lemmings 10/22 @ 1:30.   Next paramedicine visit in 2 weeks.  ACTION: Home visit completed  [name] [phone] 09/28/22  Patient Care Team: Hoy Register, MD as PCP - General (Family Medicine) Chrystie Nose, MD as PCP - Cardiology (Cardiology)  Patient Active Problem List   Diagnosis Date Noted   Nonrheumatic aortic valve insufficiency 04/19/2022   Coronary artery disease 11/20/2021   Elevated troponin 11/19/2021   Heart failure (HCC) 11/19/2021   Acute on chronic systolic heart failure (HCC) 11/18/2021   Chronic systolic heart failure (HCC)    Chest pain, precordial 10/09/2020   Cocaine use 10/09/2020   NSTEMI (non-ST elevated myocardial infarction) (HCC) 10/09/2020   Polysubstance abuse (HCC) 10/09/2020   Hyperlipidemia 10/09/2020   Lung nodule 10/09/2020   Tobacco dependence 10/09/2020   Screening breast examination 09/04/2018   Heroin dependence (HCC) 06/29/2018   TIA (transient ischemic attack) 06/24/2018   Nail fungus 04/20/2015   Porokeratosis 04/20/2015   History of ulceration 04/20/2015   Foot ulcer (HCC) 02/17/2014   Cellulitis of foot, right  02/17/2014   Chronic pain syndrome 02/17/2014   Essential hypertension 02/17/2014    Current Outpatient Medications:    atorvastatin (LIPITOR) 40 MG tablet, Take 1 tablet (40 mg total) by mouth daily at 6 PM., Disp: 30 tablet, Rfl: 3   carvedilol (COREG) 6.25 MG tablet, Take 1 tablet (6.25 mg total) by mouth 2 (two) times daily., Disp: 180 tablet, Rfl: 3   clopidogrel (PLAVIX) 75 MG tablet, Take 1 tablet (75 mg total) by mouth daily., Disp: 30 tablet, Rfl: 1   empagliflozin (JARDIANCE) 10 MG TABS tablet, Take 1 tablet (10 mg total) by mouth daily., Disp: 30 tablet, Rfl: 1   ezetimibe (ZETIA) 10 MG tablet, Take 1 tablet (10 mg total) by mouth daily., Disp: 30 tablet, Rfl: 3   sacubitril-valsartan (ENTRESTO) 24-26 MG, Take 1 tablet by mouth 2 (two) times daily., Disp: 180 tablet, Rfl: 3   spironolactone (ALDACTONE) 25 MG tablet, Take 1 tablet (25 mg total) by mouth daily., Disp: 30 tablet, Rfl: 3 No Known Allergies   Social History   Socioeconomic History   Marital status: Single    Spouse name: Not on file   Number of children: 3   Years of education: Not on file   Highest education level: 12th grade  Occupational History   Not on file  Tobacco Use   Smoking status: Some Days    Current packs/day: 1.00    Types: Cigars, Cigarettes   Smokeless tobacco: Never  Vaping Use   Vaping status: Never Used  Substance and Sexual Activity   Alcohol use: Not Currently    Comment: social drinking   Drug use: Not Currently    Types: Marijuana, Cocaine    Comment: 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   Sexual activity: Not Currently    Birth control/protection: Post-menopausal  Other Topics Concern   Not on file  Social History Narrative   Not on file   Social Determinants of Health   Financial Resource Strain: High Risk (12/14/2021)   Overall Financial Resource Strain (CARDIA)    Difficulty of Paying Living Expenses: Hard  Food Insecurity: No Food Insecurity (12/14/2021)   Hunger  Vital Sign    Worried About Running Out of Food in the Last Year: Never true    Ran Out of Food in the Last Year: Never true  Transportation Needs: No Transportation Needs (12/14/2021)   PRAPARE - Administrator, Civil Service (Medical): No    Lack of Transportation (Non-Medical): No  Physical Activity: Inactive (02/21/2017)   Exercise Vital Sign    Days of Exercise per Week: 0 days    Minutes of Exercise per Session: 0 min  Stress: No Stress Concern Present (02/21/2017)   [employer] of Occupational Health - Occupational Stress Questionnaire    Feeling of Stress : Not at all  Social Connections: Somewhat Isolated (02/21/2017)   Social Connection and Isolation Panel [NHANES]    Frequency of Communication with Friends and Family: More than three times a week    Frequency of Social Gatherings with Friends and Family: Once a week    Attends Religious Services: 1 to 4 times per year    Active Member of [employer] or Organizations: No    Attends Banker Meetings: Never    Marital Status: Never married  Intimate Partner Violence: Not At Risk (11/19/2021)   Humiliation, Afraid, Rape, and Kick questionnaire    Fear of Current or Ex-Partner: No    Emotionally Abused: No    Physically Abused: No    Sexually Abused: No    Physical Exam      Future Appointments  Date Time Provider Department Center  11/08/2022  1:30 PM Hoy Register, MD [facility] None

## 2022-10-04 ENCOUNTER — Other Ambulatory Visit: Payer: Self-pay

## 2022-10-12 ENCOUNTER — Other Ambulatory Visit (HOSPITAL_COMMUNITY): Payer: Self-pay | Admitting: Emergency Medicine

## 2022-10-12 NOTE — Progress Notes (Unsigned)
Paramedicine Encounter    Patient ID: Leslie Walker, female    DOB: [birth year], 57 y.o.   MRN: [id number]   Complaints***  Assessment***  Compliance with meds YES  Pill box filled x 2 weeks  Refills needed Atorvastatin [id number]  Meds changes since last visit NONE    Social changes NONE   LMP  (LMP Unknown) Comment: Pt unsure why she no longer gets her period Weight yesterday-not taken Last visit weight-133lb  ACTION:  {Paramed Action:[phone]}  Beatrix Shipper, EMT-Paramedic [phone] 10/12/22  Patient Care Team: Hoy Register, MD as PCP - General (Family Medicine) Rennis Golden Lisette Abu, MD as PCP - Cardiology (Cardiology)  Patient Active Problem List   Diagnosis Date Noted  . Nonrheumatic aortic valve insufficiency 04/19/2022  . Coronary artery disease 11/20/2021  . Elevated troponin 11/19/2021  . Heart failure (HCC) 11/19/2021  . Acute on chronic systolic heart failure (HCC) 11/18/2021  . Chronic systolic heart failure (HCC)   . Chest pain, precordial 10/09/2020  . Cocaine use 10/09/2020  . NSTEMI (non-ST elevated myocardial infarction) (HCC) 10/09/2020  . Polysubstance abuse (HCC) 10/09/2020  . Hyperlipidemia 10/09/2020  . Lung nodule 10/09/2020  . Tobacco dependence 10/09/2020  . Screening breast examination 09/04/2018  . Heroin dependence (HCC) 06/29/2018  . TIA (transient ischemic attack) 06/24/2018  . Nail fungus 04/20/2015  . Porokeratosis 04/20/2015  . History of ulceration 04/20/2015  . Foot ulcer (HCC) 02/17/2014  . Cellulitis of foot, right 02/17/2014  . Chronic pain syndrome 02/17/2014  . Essential hypertension 02/17/2014    Current Outpatient Medications:  .  atorvastatin (LIPITOR) 40 MG tablet, Take 1 tablet (40 mg total) by mouth daily at 6 PM., Disp: 30 tablet, Rfl: 3 .  carvedilol (COREG) 6.25 MG tablet, Take 1 tablet (6.25 mg total) by mouth 2 (two) times daily., Disp: 180 tablet, Rfl: 3 .  clopidogrel (PLAVIX) 75 MG tablet, Take 1  tablet (75 mg total) by mouth daily., Disp: 30 tablet, Rfl: 1 .  empagliflozin (JARDIANCE) 10 MG TABS tablet, Take 1 tablet (10 mg total) by mouth daily., Disp: 30 tablet, Rfl: 1 .  ezetimibe (ZETIA) 10 MG tablet, Take 1 tablet (10 mg total) by mouth daily., Disp: 30 tablet, Rfl: 3 .  sacubitril-valsartan (ENTRESTO) 24-26 MG, Take 1 tablet by mouth 2 (two) times daily., Disp: 180 tablet, Rfl: 3 .  spironolactone (ALDACTONE) 25 MG tablet, Take 1 tablet (25 mg total) by mouth daily., Disp: 30 tablet, Rfl: 3 No Known Allergies   Social History   Socioeconomic History  . Marital status: Single    Spouse name: Not on file  . Number of children: 3  . Years of education: Not on file  . Highest education level: 12th grade  Occupational History  . Not on file  Tobacco Use  . Smoking status: Some Days    Current packs/day: 1.00    Types: Cigars, Cigarettes  . Smokeless tobacco: Never  Vaping Use  . Vaping status: Never Used  Substance and Sexual Activity  . Alcohol use: Not Currently    Comment: social drinking  . Drug use: Not Currently    Types: Marijuana, Cocaine    Comment: 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  . Sexual activity: Not Currently    Birth control/protection: Post-menopausal  Other Topics Concern  . Not on file  Social History Narrative  . Not on file   Social Determinants of Health   Financial Resource Strain: High Risk (12/14/2021)   Overall Financial Resource Strain (CARDIA)   .  Difficulty of Paying Living Expenses: Hard  Food Insecurity: No Food Insecurity (12/14/2021)   Hunger Vital Sign   . Worried About Programme researcher, broadcasting/film/video in the Last Year: Never true   . Ran Out of Food in the Last Year: Never true  Transportation Needs: No Transportation Needs (12/14/2021)   PRAPARE - Transportation   . Lack of Transportation (Medical): No   . Lack of Transportation (Non-Medical): No  Physical Activity: Inactive (02/21/2017)   Exercise Vital Sign   . Days of Exercise  per Week: 0 days   . Minutes of Exercise per Session: 0 min  Stress: No Stress Concern Present (02/21/2017)   [employer] of Occupational Health - Occupational Stress Questionnaire   . Feeling of Stress : Not at all  Social Connections: Somewhat Isolated (02/21/2017)   Social Connection and Isolation Panel [NHANES]   . Frequency of Communication with Friends and Family: More than three times a week   . Frequency of Social Gatherings with Friends and Family: Once a week   . Attends Religious Services: 1 to 4 times per year   . Active Member of Clubs or Organizations: No   . Attends Banker Meetings: Never   . Marital Status: Never married  Intimate Partner Violence: Not At Risk (11/19/2021)   Humiliation, Afraid, Rape, and Kick questionnaire   . Fear of Current or Ex-Partner: No   . Emotionally Abused: No   . Physically Abused: No   . Sexually Abused: No    Physical Exam      Future Appointments  Date Time Provider Department Center  11/08/2022  1:30 PM Hoy Register, MD [facility] None

## 2022-10-13 ENCOUNTER — Other Ambulatory Visit: Payer: Self-pay

## 2022-10-13 ENCOUNTER — Other Ambulatory Visit (HOSPITAL_COMMUNITY): Payer: Self-pay | Admitting: Cardiology

## 2022-10-13 MED ORDER — ATORVASTATIN CALCIUM 40 MG PO TABS
40.0000 mg | ORAL_TABLET | Freq: Every day | ORAL | 3 refills | Status: DC
  Filled 2022-10-13 – 2022-11-01 (×2): qty 30, 30d supply, fill #0
  Filled 2022-12-07: qty 30, 30d supply, fill #1

## 2022-10-25 ENCOUNTER — Other Ambulatory Visit: Payer: Self-pay

## 2022-10-26 ENCOUNTER — Other Ambulatory Visit (HOSPITAL_COMMUNITY): Payer: Self-pay | Admitting: Emergency Medicine

## 2022-10-26 ENCOUNTER — Other Ambulatory Visit: Payer: Self-pay

## 2022-10-26 NOTE — Progress Notes (Signed)
Paramedicine Encounter    Patient ID: Leslie Walker, female    DOB: 18-Nov-1965, 57 y.o.   MRN: [id number]   Complaints NONE  Assessment A&O x 4, skin W&D w/ good color.  No chest pain or SOB.  Lung sounds clear and equal bilat.  Compliance with meds YES  Pill box filled x 1 week  Refills needed Clopidogrel, Spironolactone   Meds changes since last visit NONE    Social changes NONE   LMP  (LMP Unknown) Comment: Pt unsure why she no longer gets her period Weight yesterday-not taken Last visit weight-137lb  ATF Ms. Karwowski reporting to be feeling very well.  She denies chest pain or SOB.   Bowel movements normal and no dark or bright red blood in stool.  She has been compliant with all meds.  Pill box reconciled x 1 week.  Refills called in to pharmacy. Next home visit scheduled 11/02/22 @ 2:30.    ACTION: Home visit completed  [name] [phone] 10/26/22  Patient Care Team: Hoy Register, MD as PCP - General (Family Medicine) Chrystie Nose, MD as PCP - Cardiology (Cardiology)  Patient Active Problem List   Diagnosis Date Noted   Nonrheumatic aortic valve insufficiency 04/19/2022   Coronary artery disease 11/20/2021   Elevated troponin 11/19/2021   Heart failure (HCC) 11/19/2021   Acute on chronic systolic heart failure (HCC) 11/18/2021   Chronic systolic heart failure (HCC)    Chest pain, precordial 10/09/2020   Cocaine use 10/09/2020   NSTEMI (non-ST elevated myocardial infarction) (HCC) 10/09/2020   Polysubstance abuse (HCC) 10/09/2020   Hyperlipidemia 10/09/2020   Lung nodule 10/09/2020   Tobacco dependence 10/09/2020   Screening breast examination 09/04/2018   Heroin dependence (HCC) 06/29/2018   TIA (transient ischemic attack) 06/24/2018   Nail fungus 04/20/2015   Porokeratosis 04/20/2015   History of ulceration 04/20/2015   Foot ulcer (HCC) 02/17/2014   Cellulitis of foot, right 02/17/2014   Chronic pain syndrome 02/17/2014    Essential hypertension 02/17/2014    Current Outpatient Medications:    atorvastatin (LIPITOR) 40 MG tablet, Take 1 tablet (40 mg total) by mouth daily at 6 PM. NEEDS FOLLOW UP APPOINTMENT FOR MORE REFILLS, Disp: 30 tablet, Rfl: 3   carvedilol (COREG) 6.25 MG tablet, Take 1 tablet (6.25 mg total) by mouth 2 (two) times daily., Disp: 180 tablet, Rfl: 3   clopidogrel (PLAVIX) 75 MG tablet, Take 1 tablet (75 mg total) by mouth daily., Disp: 30 tablet, Rfl: 1   empagliflozin (JARDIANCE) 10 MG TABS tablet, Take 1 tablet (10 mg total) by mouth daily., Disp: 30 tablet, Rfl: 1   ezetimibe (ZETIA) 10 MG tablet, Take 1 tablet (10 mg total) by mouth daily., Disp: 30 tablet, Rfl: 3   sacubitril-valsartan (ENTRESTO) 24-26 MG, Take 1 tablet by mouth 2 (two) times daily., Disp: 180 tablet, Rfl: 3   spironolactone (ALDACTONE) 25 MG tablet, Take 1 tablet (25 mg total) by mouth daily., Disp: 30 tablet, Rfl: 3 No Known Allergies   Social History   Socioeconomic History   Marital status: Single    Spouse name: Not on file   Number of children: 3   Years of education: Not on file   Highest education level: 12th grade  Occupational History   Not on file  Tobacco Use   Smoking status: Some Days    Current packs/day: 1.00    Types: Cigars, Cigarettes   Smokeless tobacco: Never  Vaping Use   Vaping status: Never  Used  Substance and Sexual Activity   Alcohol use: Not Currently    Comment: social drinking   Drug use: Not Currently    Types: Marijuana, Cocaine    Comment: 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   Sexual activity: Not Currently    Birth control/protection: Post-menopausal  Other Topics Concern   Not on file  Social History Narrative   Not on file   Social Determinants of Health   Financial Resource Strain: High Risk (12/14/2021)   Overall Financial Resource Strain (CARDIA)    Difficulty of Paying Living Expenses: Hard  Food Insecurity: No Food Insecurity (12/14/2021)   Hunger Vital  Sign    Worried About Running Out of Food in the Last Year: Never true    Ran Out of Food in the Last Year: Never true  Transportation Needs: No Transportation Needs (12/14/2021)   PRAPARE - Administrator, Civil Service (Medical): No    Lack of Transportation (Non-Medical): No  Physical Activity: Inactive (02/21/2017)   Exercise Vital Sign    Days of Exercise per Week: 0 days    Minutes of Exercise per Session: 0 min  Stress: No Stress Concern Present (02/21/2017)   [employer] of Occupational Health - Occupational Stress Questionnaire    Feeling of Stress : Not at all  Social Connections: Somewhat Isolated (02/21/2017)   Social Connection and Isolation Panel [NHANES]    Frequency of Communication with Friends and Family: More than three times a week    Frequency of Social Gatherings with Friends and Family: Once a week    Attends Religious Services: 1 to 4 times per year    Active Member of [employer] or Organizations: No    Attends Banker Meetings: Never    Marital Status: Never married  Intimate Partner Violence: Not At Risk (11/19/2021)   Humiliation, Afraid, Rape, and Kick questionnaire    Fear of Current or Ex-Partner: No    Emotionally Abused: No    Physically Abused: No    Sexually Abused: No    Physical Exam      Future Appointments  Date Time Provider Department Center  11/08/2022  1:30 PM Hoy Register, MD [facility] None

## 2022-11-01 ENCOUNTER — Other Ambulatory Visit: Payer: Self-pay

## 2022-11-03 ENCOUNTER — Other Ambulatory Visit (HOSPITAL_COMMUNITY): Payer: Self-pay | Admitting: Emergency Medicine

## 2022-11-03 ENCOUNTER — Other Ambulatory Visit: Payer: Self-pay

## 2022-11-03 NOTE — Progress Notes (Signed)
Med rec only.  Pt was at work and her daughter let me in   [place] up and delivered Jillyn Hidden, Atorvastatin  Med box reconciled x 2 weeks Refills:  Clopidogrel  [phone] to be called in.  2nd week needs Clopidogrel.    Beatrix Shipper, EMT-Paramedic [phone] 11/03/2022

## 2022-11-08 ENCOUNTER — Other Ambulatory Visit (HOSPITAL_COMMUNITY): Payer: Self-pay | Admitting: Cardiology

## 2022-11-08 ENCOUNTER — Encounter: Payer: Self-pay | Admitting: Family Medicine

## 2022-11-08 ENCOUNTER — Ambulatory Visit: Payer: Medicaid Other | Attending: Family Medicine | Admitting: Family Medicine

## 2022-11-08 ENCOUNTER — Other Ambulatory Visit: Payer: Self-pay

## 2022-11-08 VITALS — BP 146/84 | HR 82 | Ht 64.0 in | Wt 141.6 lb

## 2022-11-08 DIAGNOSIS — I5022 Chronic systolic (congestive) heart failure: Secondary | ICD-10-CM

## 2022-11-08 DIAGNOSIS — Z23 Encounter for immunization: Secondary | ICD-10-CM

## 2022-11-08 DIAGNOSIS — F17210 Nicotine dependence, cigarettes, uncomplicated: Secondary | ICD-10-CM | POA: Diagnosis not present

## 2022-11-08 DIAGNOSIS — Z1211 Encounter for screening for malignant neoplasm of colon: Secondary | ICD-10-CM

## 2022-11-08 DIAGNOSIS — I10 Essential (primary) hypertension: Secondary | ICD-10-CM

## 2022-11-08 DIAGNOSIS — I2510 Atherosclerotic heart disease of native coronary artery without angina pectoris: Secondary | ICD-10-CM

## 2022-11-08 DIAGNOSIS — I214 Non-ST elevation (NSTEMI) myocardial infarction: Secondary | ICD-10-CM

## 2022-11-08 MED ORDER — CLOPIDOGREL BISULFATE 75 MG PO TABS
75.0000 mg | ORAL_TABLET | Freq: Every day | ORAL | 1 refills | Status: DC
Start: 2022-11-08 — End: 2022-12-07
  Filled 2022-11-08: qty 30, 30d supply, fill #0
  Filled 2022-12-07: qty 30, 30d supply, fill #1

## 2022-11-08 NOTE — Patient Instructions (Signed)

## 2022-11-08 NOTE — Progress Notes (Signed)
Subjective:  Patient ID: Leslie Walker, female    DOB: 20-Feb-1965  Age: 57 y.o. MRN: [id number]  CC: Medical Management of Chronic Issues   HPI Rodney Dangler is a 57 y.o. year old female with a history of HFrEF (EF 55%), CAD (status post DES to RCA in 2022), moderate to severe AI, hypertension, hyperlipidemia, stroke (status postplacement of LINQ monitor), Nicotine dependence ( smoked since she was 39 years about 1-2 Cigarettes/day)   Interval History: Discussed the use of AI scribe software for clinical note transcription with the patient, who gave verbal consent to proceed.  She presents for a routine follow-up. She reports adherence to her prescribed medications and denies any chest pain or shortness of breath. She was seen in the heart failure clinic in June and has been taking Plavix since her stent placement. . She also takes her Atorvastatin and denies presence of chest pain or dyspnea.  The patient has been referred for a colonoscopy but has not scheduled the procedure. She expresses some fear and reluctance about the procedure. The doctor offers the option of a stool specimen kit for colon cancer screening, which the patient agrees to.  The patient reports occasional tiredness but continues to walk and work a Engineer, agricultural job. She also mentions that she has been trying to quit smoking, but finds it challenging due to stressors at home.  The patient's blood pressure was high at the start of the visit, but she reports taking her medication late.       Past Medical History:  Diagnosis Date   Chronic systolic heart failure (HCC)    Cocaine abuse (HCC)    Dyslipidemia    Hypertension    Noncompliance w/medication treatment due to intermit use of medication    Stroke [facility]) 2021   no deficits    Past Surgical History:  Procedure Laterality Date   CORONARY PRESSURE/FFR STUDY N/A 10/09/2020   Procedure: INTRAVASCULAR PRESSURE WIRE/FFR STUDY;  Surgeon: Lyn Records, MD;  Location:  [facility] INVASIVE CV LAB;  Service: Cardiovascular;  Laterality: N/A;   CORONARY STENT INTERVENTION N/A 10/09/2020   Procedure: CORONARY STENT INTERVENTION;  Surgeon: Lyn Records, MD;  Location: [facility] INVASIVE CV LAB;  Service: Cardiovascular;  Laterality: N/A;   LEFT HEART CATH AND CORONARY ANGIOGRAPHY N/A 10/09/2020   Procedure: LEFT HEART CATH AND CORONARY ANGIOGRAPHY;  Surgeon: Lyn Records, MD;  Location: [facility] INVASIVE CV LAB;  Service: Cardiovascular;  Laterality: N/A;   LOOP RECORDER INSERTION N/A 06/27/2018   Procedure: LOOP RECORDER INSERTION;  Surgeon: Regan Lemming, MD;  Location: [facility] INVASIVE CV LAB;  Service: Cardiovascular;  Laterality: N/A;   RIGHT/LEFT HEART CATH AND CORONARY ANGIOGRAPHY N/A 11/22/2021   Procedure: RIGHT/LEFT HEART CATH AND CORONARY ANGIOGRAPHY;  Surgeon: Laurey Morale, MD;  Location: [facility] INVASIVE CV LAB;  Service: Cardiovascular;  Laterality: N/A;   TEE WITHOUT CARDIOVERSION N/A 11/23/2021   Procedure: TRANSESOPHAGEAL ECHOCARDIOGRAM (TEE);  Surgeon: Laurey Morale, MD;  Location: [facility] ENDOSCOPY;  Service: Cardiovascular;  Laterality: N/A;   TEE WITHOUT CARDIOVERSION N/A 04/19/2022   Procedure: TRANSESOPHAGEAL ECHOCARDIOGRAM (TEE);  Surgeon: Laurey Morale, MD;  Location: [facility] ENDOSCOPY;  Service: Cardiovascular;  Laterality: N/A;    Family History  Problem Relation Age of Onset   Hypertension Mother    Hypertension Father    Hypertension Brother    Stroke Sister     Social History   Socioeconomic History   Marital status: Single    Spouse name: Not on file  Number of children: 3   Years of education: Not on file   Highest education level: 12th grade  Occupational History   Not on file  Tobacco Use   Smoking status: Some Days    Current packs/day: 1.00    Types: Cigars, Cigarettes   Smokeless tobacco: Never  Vaping Use   Vaping status: Never Used  Substance and Sexual Activity   Alcohol use: Not Currently    Comment: social drinking   Drug use:  Not Currently    Types: Marijuana, Cocaine    Comment: 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   Sexual activity: Not Currently    Birth control/protection: Post-menopausal  Other Topics Concern   Not on file  Social History Narrative   Not on file   Social Determinants of Health   Financial Resource Strain: High Risk (12/14/2021)   Overall Financial Resource Strain (CARDIA)    Difficulty of Paying Living Expenses: Hard  Food Insecurity: No Food Insecurity (12/14/2021)   Hunger Vital Sign    Worried About Running Out of Food in the Last Year: Never true    Ran Out of Food in the Last Year: Never true  Transportation Needs: No Transportation Needs (12/14/2021)   PRAPARE - Administrator, Civil Service (Medical): No    Lack of Transportation (Non-Medical): No  Physical Activity: Inactive (02/21/2017)   Exercise Vital Sign    Days of Exercise per Week: 0 days    Minutes of Exercise per Session: 0 min  Stress: No Stress Concern Present (02/21/2017)   [employer] of Occupational Health - Occupational Stress Questionnaire    Feeling of Stress : Not at all  Social Connections: Somewhat Isolated (02/21/2017)   Social Connection and Isolation Panel [NHANES]    Frequency of Communication with Friends and Family: More than three times a week    Frequency of Social Gatherings with Friends and Family: Once a week    Attends Religious Services: 1 to 4 times per year    Active Member of [employer] or Organizations: No    Attends Banker Meetings: Never    Marital Status: Never married    No Known Allergies  Outpatient Medications Prior to Visit  Medication Sig Dispense Refill   atorvastatin (LIPITOR) 40 MG tablet Take 1 tablet (40 mg total) by mouth daily at 6 PM. NEEDS FOLLOW UP APPOINTMENT FOR MORE REFILLS 30 tablet 3   carvedilol (COREG) 6.25 MG tablet Take 1 tablet (6.25 mg total) by mouth 2 (two) times daily. 180 tablet 3   clopidogrel (PLAVIX) 75 MG tablet Take 1  tablet (75 mg total) by mouth daily. 30 tablet 1   empagliflozin (JARDIANCE) 10 MG TABS tablet Take 1 tablet (10 mg total) by mouth daily. 30 tablet 1   ezetimibe (ZETIA) 10 MG tablet Take 1 tablet (10 mg total) by mouth daily. 30 tablet 3   sacubitril-valsartan (ENTRESTO) 24-26 MG Take 1 tablet by mouth 2 (two) times daily. 180 tablet 3   spironolactone (ALDACTONE) 25 MG tablet Take 1 tablet (25 mg total) by mouth daily. 30 tablet 3   No facility-administered medications prior to visit.     ROS Review of Systems  Constitutional:  Positive for fatigue. Negative for activity change and appetite change.  HENT:  Negative for sinus pressure and sore throat.   Respiratory:  Negative for chest tightness, shortness of breath and wheezing.   Cardiovascular:  Negative for chest pain and palpitations.  Gastrointestinal:  Negative  for abdominal distention, abdominal pain and constipation.  Genitourinary: Negative.   Musculoskeletal: Negative.   Psychiatric/Behavioral:  Negative for behavioral problems and dysphoric mood.     Objective:  BP (!) 146/84   Pulse 82   Ht 5\' 4"  (1.626 m)   Wt 141 lb 9.6 oz (64.2 kg)   LMP  (LMP Unknown) Comment: Pt unsure why she no longer gets her period  SpO2 99%   BMI 24.31 kg/m      11/08/2022    2:14 PM 11/08/2022    1:44 PM 10/26/2022    1:44 PM  BP/Weight  Systolic BP 146 179 140  Diastolic BP 84 91 90  Wt. (Lbs)  141.6 136.5  BMI  24.31 kg/m2 23.43 kg/m2      Physical Exam Constitutional:      Appearance: She is well-developed.  Cardiovascular:     Rate and Rhythm: Normal rate.     Heart sounds: Murmur heard.  Pulmonary:     Effort: Pulmonary effort is normal.     Breath sounds: Normal breath sounds. No wheezing or rales.  Chest:     Chest wall: No tenderness.  Abdominal:     General: Bowel sounds are normal. There is no distension.     Palpations: Abdomen is soft. There is no mass.     Tenderness: There is no abdominal tenderness.   Musculoskeletal:        General: Normal range of motion.     Right lower leg: No edema.     Left lower leg: No edema.  Neurological:     Mental Status: She is alert and oriented to person, place, and time.  Psychiatric:        Mood and Affect: Mood normal.        Latest Ref Rng & Units 07/27/2022    1:42 PM 07/05/2022    9:44 AM 04/14/2022   11:30 AM  CMP  Glucose 70 - 99 mg/dL 161  096  045   BUN 6 - 20 mg/dL 12  11  12    Creatinine 0.44 - 1.00 mg/dL 4.09  8.11  9.14   Sodium 135 - 145 mmol/L 138  139  138   Potassium 3.5 - 5.1 mmol/L 4.3  4.2  4.3   Chloride 98 - 111 mmol/L 105  103  103   CO2 22 - 32 mmol/L 25  26  24    Calcium 8.9 - 10.3 mg/dL 8.9  9.0  9.2     Lipid Panel     Component Value Date/Time   CHOL 115 04/04/2022 0954   CHOL 156 03/03/2021 1132   TRIG 78 04/04/2022 0954   HDL 44 04/04/2022 0954   HDL 60 03/03/2021 1132   CHOLHDL 2.6 04/04/2022 0954   VLDL 16 04/04/2022 0954   LDLCALC 55 04/04/2022 0954   LDLCALC 80 03/03/2021 1132    CBC    Component Value Date/Time   WBC 5.3 11/19/2021 0339   RBC 4.01 11/19/2021 0339   HGB 12.6 11/22/2021 1400   HGB 12.6 11/22/2021 1400   HCT 37.0 11/22/2021 1400   HCT 37.0 11/22/2021 1400   PLT 303 11/19/2021 0339   MCV 83.0 11/19/2021 0339   MCH 29.9 11/19/2021 0339   MCHC 36.0 11/19/2021 0339   RDW 13.9 11/19/2021 0339   LYMPHSABS 2.8 11/19/2021 0339   MONOABS 0.4 11/19/2021 0339   EOSABS 0.2 11/19/2021 0339   BASOSABS 0.1 11/19/2021 0339    Lab Results  Component  Value Date   HGBA1C 5.6 10/09/2020    Assessment & Plan:      Coronary Artery Disease Status post stent placement in 2022 Currently on Plavix. No chest pain reported.  -Continue Statin as prescribed.  Congestive Heart Failure Improved EF of 50-55% No reported symptoms of exacerbation.  Patient is adherent to medications. -Continue current heart failure management. -Follow-up with cardiology  Hypertension Blood pressure  elevated at visit, but patient reported taking medication late. No changes in medication regimen. -Continue current antihypertensive medications. -Maintain low sodium diet. -Recheck blood pressure at next visit.  Colon Cancer Screening Patient has not scheduled colonoscopy despite previous referral. Patient prefers stool-based screening. -Order Cologuard for colon cancer screening.  Influenza Vaccination Patient initially hesitant but agreed to receive the vaccine. -Administer influenza vaccine today.          No orders of the defined types were placed in this encounter.   Follow-up: Return in about 6 months (around 05/09/2023) for Chronic medical conditions.       Hoy Register, MD, FAAFP. [facility] [place], [place] [phone]   11/08/2022, 3:49 PM

## 2022-11-09 ENCOUNTER — Other Ambulatory Visit: Payer: Self-pay

## 2022-11-15 ENCOUNTER — Other Ambulatory Visit (HOSPITAL_COMMUNITY): Payer: Self-pay | Admitting: Emergency Medicine

## 2022-11-15 NOTE — Progress Notes (Signed)
Paramedicine Encounter    Patient ID: Leslie Walker, female    DOB: April 03, 1965, 57 y.o.   MRN: [id number]   Complaints NONE  Assessment A&O x 4, skin W&D w/ good color.  Lung sounds clear throughout and no edema noted.  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  Did so without issues.   Med box reconciled x 2 weeks.  She denies chest pain or SOB.  Lung sounds clear throughout.   No edema noted.  Next appointment w/ Dr. Shirlee Latch 11/20 @ 3:20 Next home paramedicine visit 11/13 @ 10:00.  ACTION: Home visit completed  [name] [phone] 11/15/22  Patient Care Team: Hoy Register, MD as PCP - General (Family Medicine) Chrystie Nose, MD as PCP - Cardiology (Cardiology)  Patient Active Problem List   Diagnosis Date Noted   Nonrheumatic aortic valve insufficiency 04/19/2022   Coronary artery disease 11/20/2021   Elevated troponin 11/19/2021   Heart failure (HCC) 11/19/2021   Acute on chronic systolic heart failure (HCC) 11/18/2021   Chronic systolic heart failure (HCC)    Chest pain, precordial 10/09/2020   Cocaine use 10/09/2020   NSTEMI (non-ST elevated myocardial infarction) (HCC) 10/09/2020   Polysubstance abuse (HCC) 10/09/2020   Hyperlipidemia 10/09/2020   Lung nodule 10/09/2020   Tobacco dependence 10/09/2020   Screening breast examination 09/04/2018   Heroin dependence (HCC) 06/29/2018   TIA  (transient ischemic attack) 06/24/2018   Nail fungus 04/20/2015   Porokeratosis 04/20/2015   History of ulceration 04/20/2015   Foot ulcer (HCC) 02/17/2014   Cellulitis of foot, right 02/17/2014   Chronic pain syndrome 02/17/2014   Essential hypertension 02/17/2014    Current Outpatient Medications:    atorvastatin (LIPITOR) 40 MG tablet, Take 1 tablet (40 mg total) by mouth daily at 6 PM. NEEDS FOLLOW UP APPOINTMENT FOR MORE REFILLS, Disp: 30 tablet, Rfl: 3   carvedilol (COREG) 6.25 MG tablet, Take 1 tablet (6.25 mg total) by mouth 2 (two) times daily., Disp: 180 tablet, Rfl: 3   clopidogrel (PLAVIX) 75 MG tablet, Take 1 tablet (75 mg total) by mouth daily., Disp: 30 tablet, Rfl: 1   empagliflozin (JARDIANCE) 10 MG TABS tablet, Take 1 tablet (10 mg total) by mouth daily., Disp: 30 tablet, Rfl: 1   ezetimibe (ZETIA) 10 MG tablet, Take 1 tablet (10 mg total) by mouth daily., Disp: 30 tablet, Rfl: 3   sacubitril-valsartan (ENTRESTO) 24-26 MG, Take 1 tablet by mouth 2 (two) times daily., Disp: 180 tablet, Rfl: 3   spironolactone (ALDACTONE) 25 MG tablet, Take 1 tablet (25 mg total) by mouth daily., Disp: 30 tablet, Rfl: 3 No Known Allergies   Social History   Socioeconomic History   Marital status: Single    Spouse name: Not on file   Number of children: 3   Years of education:  Not on file   Highest education level: 12th grade  Occupational History   Not on file  Tobacco Use   Smoking status: Some Days    Current packs/day: 1.00    Types: Cigars, Cigarettes   Smokeless tobacco: Never  Vaping Use   Vaping status: Never Used  Substance and Sexual Activity   Alcohol use: Not Currently    Comment: social drinking   Drug use: Not Currently    Types: Marijuana, Cocaine    Comment: 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   Sexual activity: Not Currently    Birth control/protection: Post-menopausal  Other Topics Concern   Not on file  Social History Narrative   Not on file   Social  Determinants of Health   Financial Resource Strain: High Risk (12/14/2021)   Overall Financial Resource Strain (CARDIA)    Difficulty of Paying Living Expenses: Hard  Food Insecurity: No Food Insecurity (12/14/2021)   Hunger Vital Sign    Worried About Running Out of Food in the Last Year: Never true    Ran Out of Food in the Last Year: Never true  Transportation Needs: No Transportation Needs (12/14/2021)   PRAPARE - Administrator, Civil Service (Medical): No    Lack of Transportation (Non-Medical): No  Physical Activity: Inactive (02/21/2017)   Exercise Vital Sign    Days of Exercise per Week: 0 days    Minutes of Exercise per Session: 0 min  Stress: No Stress Concern Present (02/21/2017)   [employer] of Occupational Health - Occupational Stress Questionnaire    Feeling of Stress : Not at all  Social Connections: Somewhat Isolated (02/21/2017)   Social Connection and Isolation Panel [NHANES]    Frequency of Communication with Friends and Family: More than three times a week    Frequency of Social Gatherings with Friends and Family: Once a week    Attends Religious Services: 1 to 4 times per year    Active Member of [employer] or Organizations: No    Attends Banker Meetings: Never    Marital Status: Never married  Intimate Partner Violence: Not At Risk (11/19/2021)   Humiliation, Afraid, Rape, and Kick questionnaire    Fear of Current or Ex-Partner: No    Emotionally Abused: No    Physically Abused: No    Sexually Abused: No    Physical Exam      Future Appointments  Date Time Provider Department Center  12/07/2022  3:20 PM Laurey Morale, MD [facility] None  05/09/2023  1:30 PM Hoy Register, MD [facility] None

## 2022-11-22 LAB — COLOGUARD: COLOGUARD: NEGATIVE

## 2022-12-07 ENCOUNTER — Ambulatory Visit (HOSPITAL_COMMUNITY)
Admission: RE | Admit: 2022-12-07 | Discharge: 2022-12-07 | Disposition: A | Discharge status: Home / Self Care | Payer: Medicaid Other | Source: Ambulatory Visit | Attending: Cardiology | Admitting: Cardiology

## 2022-12-07 ENCOUNTER — Other Ambulatory Visit: Payer: Self-pay

## 2022-12-07 ENCOUNTER — Other Ambulatory Visit (HOSPITAL_COMMUNITY): Payer: Self-pay | Admitting: Emergency Medicine

## 2022-12-07 VITALS — BP 118/70 | HR 81 | Wt 144.0 lb

## 2022-12-07 DIAGNOSIS — I2510 Atherosclerotic heart disease of native coronary artery without angina pectoris: Secondary | ICD-10-CM

## 2022-12-07 DIAGNOSIS — E785 Hyperlipidemia, unspecified: Secondary | ICD-10-CM

## 2022-12-07 DIAGNOSIS — I5022 Chronic systolic (congestive) heart failure: Secondary | ICD-10-CM | POA: Diagnosis present

## 2022-12-07 LAB — BASIC METABOLIC PANEL
Anion gap: 8 (ref 5–15)
BUN: 11 mg/dL (ref 6–20)
CO2: 25 mmol/L (ref 22–32)
Calcium: 8.8 mg/dL — ABNORMAL LOW (ref 8.9–10.3)
Chloride: 104 mmol/L (ref 98–111)
Creatinine, Ser: 0.94 mg/dL (ref 0.44–1.00)
GFR, Estimated: >60 mL/min (ref >60)
Glucose, Bld: 87 mg/dL (ref 70–99)
Potassium: 4.2 mmol/L (ref 3.5–5.1)
Sodium: 137 mmol/L (ref 135–145)

## 2022-12-07 LAB — LIPID PANEL
Cholesterol: 136 mg/dL (ref 0–200)
HDL: 51 mg/dL (ref >40)
LDL Cholesterol: 74 mg/dL (ref 0–99)
Total CHOL/HDL Ratio: 2.7 {ratio}
Triglycerides: 56 mg/dL (ref <150)
VLDL: 11 mg/dL (ref 0–40)

## 2022-12-07 LAB — BRAIN NATRIURETIC PEPTIDE: B Natriuretic Peptide: 28.1 pg/mL (ref 0.0–100.0)

## 2022-12-07 MED ORDER — CARVEDILOL 6.25 MG PO TABS
6.2500 mg | ORAL_TABLET | Freq: Two times a day (BID) | ORAL | 3 refills | Status: DC
  Filled 2022-12-07: qty 180, 90d supply, fill #0

## 2022-12-07 MED ORDER — CLOPIDOGREL BISULFATE 75 MG PO TABS
75.0000 mg | ORAL_TABLET | Freq: Every day | ORAL | 3 refills | Status: DC
  Filled 2022-12-07: qty 90, 90d supply, fill #0
  Filled 2023-03-15: qty 90, 90d supply, fill #1
  Filled 2023-06-28: qty 90, 90d supply, fill #2
  Filled 2023-10-16: qty 90, 90d supply, fill #3

## 2022-12-07 MED ORDER — SPIRONOLACTONE 25 MG PO TABS
25.0000 mg | ORAL_TABLET | Freq: Every day | ORAL | 3 refills | Status: DC
  Filled 2022-12-07: qty 90, 90d supply, fill #0
  Filled 2023-03-29: qty 90, 90d supply, fill #1

## 2022-12-07 MED ORDER — ATORVASTATIN CALCIUM 40 MG PO TABS
40.0000 mg | ORAL_TABLET | Freq: Every day | ORAL | 3 refills | Status: AC
  Filled 2022-12-07: qty 90, 90d supply, fill #0
  Filled 2023-05-04: qty 90, 90d supply, fill #1

## 2022-12-07 MED ORDER — EZETIMIBE 10 MG PO TABS
10.0000 mg | ORAL_TABLET | Freq: Every day | ORAL | 3 refills | Status: DC
  Filled 2022-12-07: qty 30, 30d supply, fill #0
  Filled 2023-01-04: qty 30, 30d supply, fill #1
  Filled 2023-02-08: qty 30, 30d supply, fill #2
  Filled 2023-03-15: qty 30, 30d supply, fill #3
  Filled 2023-05-04: qty 30, 30d supply, fill #4
  Filled 2023-06-07: qty 30, 30d supply, fill #5
  Filled 2023-06-28 – 2023-07-07 (×2): qty 30, 30d supply, fill #6

## 2022-12-07 NOTE — Progress Notes (Signed)
Paramedicine Encounter   Patient ID: [name] , female,   DOB: 1965/03/18,57 y.o.,  MRN: [id number]   I met with Leslie Walker at her appointment with Dr. Shirlee Latch. Dr. Shirlee Latch advised that pt should have Echo, then follow up with heart surgeon, if necessary, for valve repair.   Wants to refer to Lipid clinic due to Lipase protein A for potential Repatha prescription.    Weight @ clinic - 144 lbs B/P-118/70 P-81 SP02-100%  Med changes- None   Pill box filled for 2x weeks.  refills needed: Atorvastatin, clopidogrel, zetia, spironolactone. - Called into pharmacy and will be ready for pick up 11/21 after Noon.    Benson Setting EMT-P Community Paramedic  [phone]

## 2022-12-07 NOTE — Patient Instructions (Signed)
There has been no changes to your medications.  Labs done today, your results will be available in MyChart, we will contact you for abnormal readings.  Your physician has requested that you have an echocardiogram. Echocardiography is a painless test that uses sound waves to create images of your heart. It provides your doctor with information about the size and shape of your heart and how well your heart's chambers and valves are working. This procedure takes approximately one hour. There are no restrictions for this procedure. Please do NOT wear cologne, perfume, aftershave, or lotions (deodorant is allowed). Please arrive 15 minutes prior to your appointment time.  Please note: We ask at that you not bring children with you during ultrasound (echo/ vascular) testing. Due to room size and safety concerns, children are not allowed in the ultrasound rooms during exams. Our front office staff cannot provide observation of children in our lobby area while testing is being conducted. An adult accompanying a patient to their appointment will only be allowed in the ultrasound room at the discretion of the ultrasound technician under special circumstances. We apologize for any inconvenience.  You have been referred to the lipid clinic. They will call you to arrange your appointment.  Your physician recommends that you schedule a follow-up appointment in: 3 months  If you have any questions or concerns before your next appointment please send [place] a message through [place] or call our office at [phone].    TO LEAVE A MESSAGE FOR THE NURSE SELECT OPTION 2, PLEASE LEAVE A MESSAGE INCLUDING: YOUR NAME DATE OF BIRTH CALL BACK NUMBER REASON FOR CALL**this is important as we prioritize the call backs  YOU WILL RECEIVE A CALL BACK THE SAME DAY AS LONG AS YOU CALL BEFORE 4:00 PM  At the [facility], you and your health needs are our priority. As part of our continuing mission to provide  you with exceptional heart care, we have created designated Provider Care Teams. These Care Teams include your primary Cardiologist (physician) and Advanced Practice Providers (APPs- Physician Assistants and Nurse Practitioners) who all work together to provide you with the care you need, when you need it.   You may see any of the following providers on your designated Care Team at your next follow up: Dr Arvilla Meres Dr Marca Ancona Dr. Dorthula Nettles Dr. Clearnce Hasten Amy Filbert Schilder, NP Robbie Lis, [place] [facility] [place], [place] Brynda Peon, NP [place] Lee, NP Karle Plumber, PharmD   Please be sure to bring in all your medications bottles to every appointment.    Thank you for choosing [facility]

## 2022-12-08 ENCOUNTER — Telehealth (HOSPITAL_COMMUNITY): Payer: Self-pay | Admitting: Cardiology

## 2022-12-08 ENCOUNTER — Other Ambulatory Visit: Payer: Self-pay

## 2022-12-08 ENCOUNTER — Other Ambulatory Visit (HOSPITAL_COMMUNITY): Payer: Self-pay

## 2022-12-08 ENCOUNTER — Telehealth (HOSPITAL_COMMUNITY): Payer: Self-pay

## 2022-12-08 DIAGNOSIS — I5022 Chronic systolic (congestive) heart failure: Secondary | ICD-10-CM

## 2022-12-08 DIAGNOSIS — E785 Hyperlipidemia, unspecified: Secondary | ICD-10-CM

## 2022-12-08 NOTE — Telephone Encounter (Signed)
Patient called.  Patient aware.  

## 2022-12-08 NOTE — Telephone Encounter (Signed)
Advanced Heart Failure Patient Advocate Encounter  Received renewal notification for Entresto through [employer]. Review of patient chart shows current active coverage. Test claim returns $4 copay for 90 days. Assistance does not need to be renewed at this time.  Burnell Blanks, CPhT Rx Patient Advocate Phone: [phone]

## 2022-12-08 NOTE — Progress Notes (Signed)
Advanced Heart Failure Team Note   PCP: Hoy Register, MD HF Cardiologist: Dr. Shirlee Latch  Leslie Walker is a 57 y.o. with a history of HTN, hyperlipidemia, stroke cypotogenic 2020/LINQ placement, CAD, polysubstance abuse heroin/cocaine abuse, and chronic HFrEF.    Admitted 09/2020 with NSTEMI. CT negative for PE but did show multivessel coronary calcium. Echo showed EF 45-50%, RV normal, moderate aortic valve stenosis. Had cath with overlapping DES to mid RCA, also with 70-80% left circumflex, left main patent, mid LAD 55%. Plan for DAPT with Aspirin + ticagrelor  x 12 months.     Admitted 11/23 with CHF.  3 months PTA she had run out of all medications. Says she didn't call for refills.   Last used cocaine 7 days prior to admission and heroin about 2 weeks prior. She was diuresed with IV lasix and GDMT titrated. She was enrolled in paramedicine program. Echo with EF 20-25%, RV okay, moderate to severe AI with possible mobile mass. TEE showed EF 20%, mildly reduced RV, bicuspid aortic valve with moderate to severe AI.  R/LHC in 11/23 showed 75% mid LCx stenosis, elevated PCWP and LVEDP but normal RV pressure, moderate pulmonary venous hypertension, CI low by thermo but preserved by Fick.  cMRI in 11/23 showed LVEF 18%, RVEF 48%, bicuspid aortic valve with severe AI, mid-wall LGE in inferolateral wall could be due to prior myocarditis but patient has CAD in LCx chronically.  Structural heart team reviewed her studies, not felt to be a candidate for TAVR.  Echo 3/24 showed EF 50-55%, normal RV, bicuspid aortic valve with moderate AS and probably moderate AI.  TEE (4/24) showed LVEF 55%, normal RV, bicuspid AoV with fused noncoronary and right coronary cusps, mild-moderate AS with mean gradient 19 and AVA 1.57 cm^2, moderate to severe AI.  Today she returns for HF follow up. 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  Weight is up 15 lbs, she says that she has been eating better.  No exertional dyspnea or chest  pain. No lightheadedness.  She has some generalized fatigue.  Still smoking a couple of cigars every few days. She has been off cocaine for months now.   ECG (personally reviewed): NSR with septal Qs  Labs (11/23): BNP 68, LDL 116, Lp(a) 295 Labs (1/24): K 4.2, creatinine 1.08 Labs (3/24): K 4.3, creatinine 1.03, LDL 55 Labs (7/24): K 4.3, creatinine 0.97  PMH: 1. Hyperlipidemia 2. CVA: 2020, cryptogenic.  LINQ monitor placed.  3. Polysubstance abuse: Cocaine, heroin.  4. CAD: NSTEMI 9/22 with DES to mid RCA.  - LHC (11/23): 75% mLCx, 50% pLAD.  5. Bicuspid aortic valve disorder:  - CTA chest 9/22 with no thoracic aortic aneurysm.  - TEE (11/23): Moderate-severe AI - Echo (3/24): Moderate AS with mean gradient 18/AVA 1.04 cm^2; moderate AI.  6. Chronic systolic CHF: Nonischemic cardiomyopathy.  - RHC (11/23): mean RA 6, PA 51/29 mean 39, mean PCWP 32, CI 2.4 Fick with PVR 1.7 WU - cMRI (11/23): Moderate LV dilation with EF 18%, RV EF 48%, bicuspid aortic valve with severe AI, non-coronary LGE pattern with mid-wall LGE in the inferolateral wall (?prior myocarditis) though patient does have CAD in LCx.   - Echo (3/24): EF 50-55%, normal RV, bicuspid aortic valve with moderate AS and probably moderate AI.  - TEE (4/24): LVEF 55%, normal RV, no LAA thrombus, PFO or ASD, bicuspid AoV withfused noncoronary and right coronary cusps, mild-moderate AS with mean gradient 19 and AVA 1.57 cm^2, moderate to severe  AI.  ROS: All systems negative except as listed in HPI, PMH and Problem List.  Social History   Socioeconomic History   Marital status: Single    Spouse name: Not on file   Number of children: 3   Years of education: Not on file   Highest education level: 12th grade  Occupational History   Not on file  Tobacco Use   Smoking status: Some Days    Current packs/day: 1.00    Types: Cigars, Cigarettes   Smokeless tobacco: Never  Vaping Use   Vaping status: Never Used  Substance  and Sexual Activity   Alcohol use: Not Currently    Comment: social drinking   Drug use: Not Currently    Types: Marijuana, Cocaine    Comment: 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   Sexual activity: Not Currently    Birth control/protection: Post-menopausal  Other Topics Concern   Not on file  Social History Narrative   Not on file   Social Determinants of Health   Financial Resource Strain: High Risk (12/14/2021)   Overall Financial Resource Strain (CARDIA)    Difficulty of Paying Living Expenses: Hard  Food Insecurity: No Food Insecurity (12/14/2021)   Hunger Vital Sign    Worried About Running Out of Food in the Last Year: Never true    Ran Out of Food in the Last Year: Never true  Transportation Needs: No Transportation Needs (12/14/2021)   PRAPARE - Administrator, Civil Service (Medical): No    Lack of Transportation (Non-Medical): No  Physical Activity: Inactive (02/21/2017)   Exercise Vital Sign    Days of Exercise per Week: 0 days    Minutes of Exercise per Session: 0 min  Stress: No Stress Concern Present (02/21/2017)   [employer] of Occupational Health - Occupational Stress Questionnaire    Feeling of Stress : Not at all  Social Connections: Somewhat Isolated (02/21/2017)   Social Connection and Isolation Panel [NHANES]    Frequency of Communication with Friends and Family: More than three times a week    Frequency of Social Gatherings with Friends and Family: Once a week    Attends Religious Services: 1 to 4 times per year    Active Member of [employer] or Organizations: No    Attends Banker Meetings: Never    Marital Status: Never married  Intimate Partner Violence: Not At Risk (11/19/2021)   Humiliation, Afraid, Rape, and Kick questionnaire    Fear of Current or Ex-Partner: No    Emotionally Abused: No    Physically Abused: No    Sexually Abused: No   FH:  Family History  Problem Relation Age of Onset   Hypertension Mother     Hypertension Father    Hypertension Brother    Stroke Sister    Past Medical History:  Diagnosis Date   Chronic systolic heart failure (HCC)    Cocaine abuse (HCC)    Dyslipidemia    Hypertension    Noncompliance w/medication treatment due to intermit use of medication    Stroke (HCC) 2021   no deficits   Current Outpatient Medications  Medication Sig Dispense Refill   empagliflozin (JARDIANCE) 10 MG TABS tablet Take 1 tablet (10 mg total) by mouth daily. 30 tablet 1   sacubitril-valsartan (ENTRESTO) 24-26 MG Take 1 tablet by mouth 2 (two) times daily. 180 tablet 3   atorvastatin (LIPITOR) 40 MG tablet Take 1 tablet (40 mg total) by mouth daily at 6  PM. 90 tablet 3   carvedilol (COREG) 6.25 MG tablet Take 1 tablet (6.25 mg total) by mouth 2 (two) times daily. 180 tablet 3   clopidogrel (PLAVIX) 75 MG tablet Take 1 tablet (75 mg total) by mouth daily. 90 tablet 3   ezetimibe (ZETIA) 10 MG tablet Take 1 tablet (10 mg total) by mouth daily. 90 tablet 3   spironolactone (ALDACTONE) 25 MG tablet Take 1 tablet (25 mg total) by mouth daily. 90 tablet 3   No current facility-administered medications for this encounter.   BP 118/70   Pulse 81   Wt 65.3 kg (144 lb)   LMP  (LMP Unknown) Comment: Pt unsure why she no longer gets her period  SpO2 100%   BMI 24.72 kg/m   Wt Readings from Last 3 Encounters:  12/07/22 65.3 kg (144 lb)  11/15/22 63.4 kg (139 lb 12.8 oz)  11/08/22 64.2 kg (141 lb 9.6 oz)   PHYSICAL EXAM: General: NAD Neck: No JVD, no thyromegaly or thyroid nodule.  Lungs: Clear to auscultation bilaterally with normal respiratory effort. CV: Nondisplaced PMI.  Heart regular S1/S2, no S3/S4, 2/6 SEM RUSB with 1/6 diastolic murmur.  No peripheral edema.  No carotid bruit.  Normal pedal pulses.  Abdomen: Soft, nontender, no hepatosplenomegaly, no distention.  Skin: Intact without lesions or rashes.  Neurologic: Alert and oriented x 3.  Psych: Normal affect. Extremities: No  clubbing or cyanosis.  HEENT: Normal.   ASSESSMENT & PLAN:  1. Chronic systolic CHF: Nonischemic CMP.  Echo 11/23 with EF 20-25%, global hypokinesis, moderate LV dilation, normal RV, mod-severe AI.  RHC showed significantly elevated PCWP and LVEDP but normal RA pressure; CI 2.4 Fick/2.0 thermo.  Cause of cardiomyopathy uncertain, CAD does not appear to have progressed (75% mLCx stenosis on 11/23 cath).  Possibly some component of substance abuse (cocaine) though by cardiac MRI in 11/23, prior myocarditis is also a concern (non-coronary LGE noted).  LV EF 18% on cMRI with relatively preserved RV function.  Aortic insufficiency also likely plays a role (moderate to severe on TEE in 11/23).  Repeat echo 3/24 showed improvement with EF 50-55%, normal RV, bicuspid aortic valve with moderate AS and moderate AI.  Improvement in EF suggests a role for cocaine in her cardiomyopathy (she had quit using drugs).  She is not volume overloaded on exam, NYHA class I-II.  - Continue Entresto 24/26 bid. BMET/BNP today.  - Continue Coreg 6.25 mg bid.    - Continue Jardiance 10 mg daily. - Continue spironolactone 25 mg daily.  2. CAD: DES to RCA in 9/22.  Cath in 11/23 with patent RCA stent, 50% proximal LAD, 75% mid LCx.  The LCx stenosis appeared similar to the prior cath, no intervention on LCx . No chest pain. Lp(a) markedly elevated with age-advanced CAD.  - Continue Plavix 75 daily.  - Continue atorvastatin, good lipids 3/24.  - With elevated Lp(a), will need to be aggressive with lipid management.  Check lipids today, if LDL > 55, would like her to start on Repatha.  3. Aortic valve disorder: Moderate-severe AI on 11/23 echo and TEE. TEE showed bicuspid aortic valve with fusion of right and noncoronary cusps.  Moderate-severe AI by TEE.  By cardiac MRI, aortic valve regurgitant fraction was 38% which suggests severe AI.  She was deemed not to be a TAVR candidate, but with improvement in LV function, she should be  SAVR candidate. TEE in 4/24 showed mild-moderate AS with mean gradient 19 and AVA  1.57 cm^2, moderate to severe AI.  - I will arrange for repeat echo.  If her valvular disease remains significant, which I suspect it will be, I will refer her to Dr. Leafy Ro for SAVR.  She will need RHC/LHC prior to valve replacement.  - Recommended screening in 1st degree relatives for bicuspid aortic valve 4. Polysubstance abuse: Cocaine/heroin.  She reports abstinence and EF has improved.  5. H/o CVA: She is on Plavix.  6. Smoking: Smokes a few cigars/week. I strongly encouraged her to quit totally.  7. SDOH: Continue paramedicine, appreciate their assistance.  Follow up in 3 months with APP.  If aortic valve disease remains severe on echo, will arrange for cath and SAVR evaluation.   Marca Ancona  12/08/2022

## 2022-12-08 NOTE — Telephone Encounter (Signed)
-----   Message from Marca Ancona sent at 12/08/2022  2:06 PM EST ----- LDL > 55 and high Lp(a).  Please refer to lipid clinic for Repatha.

## 2022-12-21 ENCOUNTER — Other Ambulatory Visit (HOSPITAL_COMMUNITY): Payer: Self-pay | Admitting: Emergency Medicine

## 2022-12-21 NOTE — Progress Notes (Signed)
Paramedicine Encounter    Patient ID: Leslie Walker, female    DOB: 08-08-1965, 57 y.o.   MRN: [id number]   Complaints NONE  Assessment A&O x 4, skin W&D w/ good color.  Denies chest pain or SOB.  Lung sounds clear and equal bilat.  No peripheral edema noted  Compliance with meds YES  Pill box filled  x 2 weeks  Refills needed NONE  Meds changes since last visit NONE    Social changes NONE   BP 130/80 (BP Location: Right Arm, Patient Position: Sitting, Cuff Size: Normal)   Pulse 80   Resp 14   Wt 137 lb 9.6 oz (62.4 kg)   LMP  (LMP Unknown) Comment: Pt unsure why she no longer gets her period  SpO2 97%   BMI 23.62 kg/m  Weight yesterday-not taken Last visit weight-139lb   ATF Ms. Chumley A&O x 4, skin W&D w/ good color.  Pt. Denies chest pain or SOB.  She has been compliant with all her meds.  Pill box reconciled x 2 weeks.  Assisted her in scheduling appt @ the lipid clinic.  Same scheduled for 02/01/2023 @ 2:30.   ACTION: Home visit completed  [name] [phone] 12/21/22  Patient Care Team: Hoy Register, MD as PCP - General (Family Medicine) Chrystie Nose, MD as PCP - Cardiology (Cardiology)  Patient Active Problem List   Diagnosis Date Noted   Nonrheumatic aortic valve insufficiency 04/19/2022   Coronary artery disease 11/20/2021   Elevated troponin 11/19/2021   Heart failure (HCC) 11/19/2021   Acute on chronic systolic heart failure (HCC) 11/18/2021   Chronic systolic heart failure (HCC)    Chest pain, precordial 10/09/2020   Cocaine use 10/09/2020   NSTEMI (non-ST elevated myocardial infarction) (HCC) 10/09/2020   Polysubstance abuse (HCC) 10/09/2020   Hyperlipidemia 10/09/2020   Lung nodule 10/09/2020   Tobacco dependence 10/09/2020   Screening breast examination 09/04/2018   Heroin dependence (HCC) 06/29/2018   TIA (transient ischemic attack) 06/24/2018   Nail fungus 04/20/2015   Porokeratosis 04/20/2015   History of  ulceration 04/20/2015   Foot ulcer (HCC) 02/17/2014   Cellulitis of foot, right 02/17/2014   Chronic pain syndrome 02/17/2014   Essential hypertension 02/17/2014    Current Outpatient Medications:    atorvastatin (LIPITOR) 40 MG tablet, Take 1 tablet (40 mg total) by mouth daily at 6 PM., Disp: 90 tablet, Rfl: 3   carvedilol (COREG) 6.25 MG tablet, Take 1 tablet (6.25 mg total) by mouth 2 (two) times daily., Disp: 180 tablet, Rfl: 3   clopidogrel (PLAVIX) 75 MG tablet, Take 1 tablet (75 mg total) by mouth daily., Disp: 90 tablet, Rfl: 3   empagliflozin (JARDIANCE) 10 MG TABS tablet, Take 1 tablet (10 mg total) by mouth daily., Disp: 30 tablet, Rfl: 1   ezetimibe (ZETIA) 10 MG tablet, Take 1 tablet (10 mg total) by mouth daily., Disp: 90 tablet, Rfl: 3   sacubitril-valsartan (ENTRESTO) 24-26 MG, Take 1 tablet by mouth 2 (two) times daily., Disp: 180 tablet, Rfl: 3   spironolactone (ALDACTONE) 25 MG tablet, Take 1 tablet (25 mg total) by mouth daily., Disp: 90 tablet, Rfl: 3 No Known Allergies   Social History   Socioeconomic History   Marital status: Single    Spouse name: Not on file   Number of children: 3   Years of education: Not on file   Highest education level: 12th grade  Occupational History   Not on file  Tobacco Use  Smoking status: Some Days    Current packs/day: 1.00    Types: Cigars, Cigarettes   Smokeless tobacco: Never  Vaping Use   Vaping status: Never Used  Substance and Sexual Activity   Alcohol use: Not Currently    Comment: social drinking   Drug use: Not Currently    Types: Marijuana, Cocaine    Comment: 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   Sexual activity: Not Currently    Birth control/protection: Post-menopausal  Other Topics Concern   Not on file  Social History Narrative   Not on file   Social Determinants of Health   Financial Resource Strain: High Risk (12/14/2021)   Overall Financial Resource Strain (CARDIA)    Difficulty of Paying  Living Expenses: Hard  Food Insecurity: No Food Insecurity (12/14/2021)   Hunger Vital Sign    Worried About Running Out of Food in the Last Year: Never true    Ran Out of Food in the Last Year: Never true  Transportation Needs: No Transportation Needs (12/14/2021)   PRAPARE - Administrator, Civil Service (Medical): No    Lack of Transportation (Non-Medical): No  Physical Activity: Inactive (02/21/2017)   Exercise Vital Sign    Days of Exercise per Week: 0 days    Minutes of Exercise per Session: 0 min  Stress: No Stress Concern Present (02/21/2017)   [employer] of Occupational Health - Occupational Stress Questionnaire    Feeling of Stress : Not at all  Social Connections: Somewhat Isolated (02/21/2017)   Social Connection and Isolation Panel [NHANES]    Frequency of Communication with Friends and Family: More than three times a week    Frequency of Social Gatherings with Friends and Family: Once a week    Attends Religious Services: 1 to 4 times per year    Active Member of [employer] or Organizations: No    Attends Banker Meetings: Never    Marital Status: Never married  Intimate Partner Violence: Not At Risk (11/19/2021)   Humiliation, Afraid, Rape, and Kick questionnaire    Fear of Current or Ex-Partner: No    Emotionally Abused: No    Physically Abused: No    Sexually Abused: No    Physical Exam      Future Appointments  Date Time Provider Department Center  01/24/2023 11:00 AM [facility] ECHO OP 1 [facility]  03/09/2023 11:30 AM [facility] PA/NP [facility] None  05/09/2023  1:30 PM Hoy Register, MD [facility] None

## 2023-01-04 ENCOUNTER — Other Ambulatory Visit (HOSPITAL_COMMUNITY): Payer: Self-pay | Admitting: Emergency Medicine

## 2023-01-04 ENCOUNTER — Other Ambulatory Visit: Payer: Self-pay

## 2023-01-04 NOTE — Progress Notes (Signed)
Paramedicine Encounter    Patient ID: Leslie Walker, female    DOB: Jul 13, 1965, 57 y.o.   MRN: [id number]   Complaints NONE  Assessment A&O x 4, skin W&D w/ good color.    Compliance with meds YES  Pill box filled x 2 weeks  Refills needed Zetia  Meds changes since last visit NONE    Social changes NONE   BP 130/80 (BP Location: Left Arm, Patient Position: Sitting, Cuff Size: Normal)   Pulse 80   Wt 141 lb 12.8 oz (64.3 kg)   LMP  (LMP Unknown) Comment: Pt unsure why she no longer gets her period  SpO2 100%   BMI 24.34 kg/m  Weight yesterday- not taken Last visit weight-144lb  ACTION: Home visit completed  [name] [phone] 01/06/23  Patient Care Team: Hoy Register, MD as PCP - General (Family Medicine) Chrystie Nose, MD as PCP - Cardiology (Cardiology)  Patient Active Problem List   Diagnosis Date Noted   Nonrheumatic aortic valve insufficiency 04/19/2022   Coronary artery disease 11/20/2021   Elevated troponin 11/19/2021   Heart failure (HCC) 11/19/2021   Acute on chronic systolic heart failure (HCC) 11/18/2021   Chronic systolic heart failure (HCC)    Chest pain, precordial 10/09/2020   Cocaine use 10/09/2020   NSTEMI (non-ST elevated myocardial infarction) (HCC) 10/09/2020   Polysubstance abuse (HCC) 10/09/2020   Hyperlipidemia 10/09/2020   Lung nodule 10/09/2020   Tobacco dependence 10/09/2020   Screening breast examination 09/04/2018   Heroin dependence (HCC) 06/29/2018   TIA (transient ischemic attack) 06/24/2018   Nail fungus 04/20/2015   Porokeratosis 04/20/2015   History of ulceration 04/20/2015   Foot ulcer (HCC) 02/17/2014   Cellulitis of foot, right 02/17/2014   Chronic pain syndrome 02/17/2014   Essential hypertension 02/17/2014    Current Outpatient Medications:    atorvastatin (LIPITOR) 40 MG tablet, Take 1 tablet (40 mg total) by mouth daily at 6 PM., Disp: 90 tablet, Rfl: 3   carvedilol (COREG) 6.25 MG  tablet, Take 1 tablet (6.25 mg total) by mouth 2 (two) times daily., Disp: 180 tablet, Rfl: 3   clopidogrel (PLAVIX) 75 MG tablet, Take 1 tablet (75 mg total) by mouth daily., Disp: 90 tablet, Rfl: 3   empagliflozin (JARDIANCE) 10 MG TABS tablet, Take 1 tablet (10 mg total) by mouth daily., Disp: 30 tablet, Rfl: 1   ezetimibe (ZETIA) 10 MG tablet, Take 1 tablet (10 mg total) by mouth daily., Disp: 90 tablet, Rfl: 3   sacubitril-valsartan (ENTRESTO) 24-26 MG, Take 1 tablet by mouth 2 (two) times daily., Disp: 180 tablet, Rfl: 3   spironolactone (ALDACTONE) 25 MG tablet, Take 1 tablet (25 mg total) by mouth daily., Disp: 90 tablet, Rfl: 3 No Known Allergies   Social History   Socioeconomic History   Marital status: Single    Spouse name: Not on file   Number of children: 3   Years of education: Not on file   Highest education level: 12th grade  Occupational History   Not on file  Tobacco Use   Smoking status: Some Days    Current packs/day: 1.00    Types: Cigars, Cigarettes   Smokeless tobacco: Never  Vaping Use   Vaping status: Never Used  Substance and Sexual Activity   Alcohol use: Not Currently    Comment: social drinking   Drug use: Not Currently    Types: Marijuana, Cocaine    Comment: 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   Sexual activity:  Not Currently    Birth control/protection: Post-menopausal  Other Topics Concern   Not on file  Social History Narrative   Not on file   Social Drivers of Health   Financial Resource Strain: High Risk (12/14/2021)   Overall Financial Resource Strain (CARDIA)    Difficulty of Paying Living Expenses: Hard  Food Insecurity: No Food Insecurity (12/14/2021)   Hunger Vital Sign    Worried About Running Out of Food in the Last Year: Never true    Ran Out of Food in the Last Year: Never true  Transportation Needs: No Transportation Needs (12/14/2021)   PRAPARE - Administrator, Civil Service (Medical): No    Lack of  Transportation (Non-Medical): No  Physical Activity: Inactive (02/21/2017)   Exercise Vital Sign    Days of Exercise per Week: 0 days    Minutes of Exercise per Session: 0 min  Stress: No Stress Concern Present (02/21/2017)   [employer] of Occupational Health - Occupational Stress Questionnaire    Feeling of Stress : Not at all  Social Connections: Somewhat Isolated (02/21/2017)   Social Connection and Isolation Panel [NHANES]    Frequency of Communication with Friends and Family: More than three times a week    Frequency of Social Gatherings with Friends and Family: Once a week    Attends Religious Services: 1 to 4 times per year    Active Member of [employer] or Organizations: No    Attends Banker Meetings: Never    Marital Status: Never married  Intimate Partner Violence: Not At Risk (11/19/2021)   Humiliation, Afraid, Rape, and Kick questionnaire    Fear of Current or Ex-Partner: No    Emotionally Abused: No    Physically Abused: No    Sexually Abused: No    Physical Exam      Future Appointments  Date Time Provider Department Center  01/24/2023 11:00 AM [facility] ECHO OP 1 [facility]  02/01/2023  2:30 PM [facility]-NLINE PHARMACIST [facility] None  03/09/2023 11:30 AM [facility] PA/NP [facility] None  05/09/2023  1:30 PM Hoy Register, MD [facility] None

## 2023-01-09 ENCOUNTER — Other Ambulatory Visit: Payer: Self-pay

## 2023-01-24 ENCOUNTER — Ambulatory Visit (HOSPITAL_COMMUNITY)
Admission: RE | Admit: 2023-01-24 | Discharge: 2023-01-24 | Disposition: A | Discharge status: Home / Self Care | Payer: Medicaid Other | Source: Ambulatory Visit | Attending: Cardiology | Admitting: Cardiology

## 2023-01-24 DIAGNOSIS — I5022 Chronic systolic (congestive) heart failure: Secondary | ICD-10-CM | POA: Insufficient documentation

## 2023-01-24 DIAGNOSIS — Z8673 Personal history of transient ischemic attack (TIA), and cerebral infarction without residual deficits: Secondary | ICD-10-CM | POA: Insufficient documentation

## 2023-01-24 DIAGNOSIS — I3139 Other pericardial effusion (noninflammatory): Secondary | ICD-10-CM | POA: Insufficient documentation

## 2023-01-24 DIAGNOSIS — I252 Old myocardial infarction: Secondary | ICD-10-CM | POA: Diagnosis not present

## 2023-01-24 DIAGNOSIS — F17210 Nicotine dependence, cigarettes, uncomplicated: Secondary | ICD-10-CM | POA: Insufficient documentation

## 2023-01-24 DIAGNOSIS — E785 Hyperlipidemia, unspecified: Secondary | ICD-10-CM | POA: Insufficient documentation

## 2023-01-24 DIAGNOSIS — I2510 Atherosclerotic heart disease of native coronary artery without angina pectoris: Secondary | ICD-10-CM | POA: Insufficient documentation

## 2023-01-24 LAB — ECHOCARDIOGRAM COMPLETE
AR max vel: 0.94 cm2
AV Area VTI: 1.07 cm2
AV Area mean vel: 0.96 cm2
AV Mean grad: 11 mm[Hg]
AV Peak grad: 21.9 mm[Hg]
AV Vena cont: 0.9 cm
Ao pk vel: 2.34 m/s
Area-P 1/2: 4.48 cm2
P 1/2 time: 609 ms
S' Lateral: 2.8 cm

## 2023-01-24 NOTE — Progress Notes (Signed)
*  PRELIMINARY RESULTS* Echocardiogram 2D Echocardiogram has been performed.  Leslie Walker 01/24/2023, 12:15 PM

## 2023-01-25 ENCOUNTER — Telehealth (HOSPITAL_COMMUNITY): Payer: Self-pay | Admitting: *Deleted

## 2023-01-25 NOTE — Telephone Encounter (Signed)
 Called patient per Dr. Rolan with following echo results:  Normal EF, moderate to moderate/severe AI.  Make sure she has followup in office with me.  Pt has f/u scheduled in [facility] 03/09/23; Dr. Rolan does not have any sooner appointments available. Pt will keep APP appointment and if Dr. Rolan available, he may speak with her at that time. Pt verbalized understanding of above.

## 2023-01-30 ENCOUNTER — Other Ambulatory Visit (HOSPITAL_COMMUNITY): Payer: Self-pay

## 2023-01-30 ENCOUNTER — Telehealth (HOSPITAL_COMMUNITY): Payer: Self-pay

## 2023-01-30 NOTE — Telephone Encounter (Addendum)
 Advanced Heart Failure Patient Advocate Encounter  Prior authorization for Jardiance  has been submitted and approved. Test billing returns $4 for 90 day supply.  Key: [id number] Effective: 01/30/2023 to 01/30/2024  Rachel DEL, CPhT Rx Patient Advocate Phone: [phone]

## 2023-01-31 ENCOUNTER — Other Ambulatory Visit (HOSPITAL_COMMUNITY): Payer: Self-pay

## 2023-02-01 ENCOUNTER — Other Ambulatory Visit: Payer: Self-pay

## 2023-02-01 ENCOUNTER — Other Ambulatory Visit (HOSPITAL_COMMUNITY): Payer: Self-pay | Admitting: Emergency Medicine

## 2023-02-01 ENCOUNTER — Ambulatory Visit
Payer: Medicaid Other | Attending: Cardiovascular Disease | Admitting: Pharmacist Clinician (PhC)/ Clinical Pharmacy Specialist

## 2023-02-01 ENCOUNTER — Encounter: Payer: Self-pay | Admitting: Pharmacist Clinician (PhC)/ Clinical Pharmacy Specialist

## 2023-02-01 DIAGNOSIS — E785 Hyperlipidemia, unspecified: Secondary | ICD-10-CM | POA: Diagnosis not present

## 2023-02-01 NOTE — Assessment & Plan Note (Signed)
 Assessment: Patient with ASCVD not at LDL goal of < 55 Most recent LDL 74 on 12/07/22 Lp(a) 294 11/24/22 Has been compliant with high intensity statin/ezetimibe  : atorvastatin  40, ezetimibe  10 Reviewed options for lowering LDL cholesterol, and hopefully La(a) - PCSK-9 inhibitors.  Discussed mechanisms of action, dosing, side effects, potential decreases in LDL cholesterol and costs.  Also reviewed potential options for patient assistance.  Plan: Patient agreeable to starting Repatha  140 mg q14d Repeat labs after:  3 months Lipid Liver function

## 2023-02-01 NOTE — Progress Notes (Signed)
 Paramedicine Encounter    Patient ID: Leslie Walker, female    DOB: 1965-10-27, 58 y.o.   MRN: [id number]   Complaints NONE  Assessment A&O x 4, skin W&D w/ good color.  Denies  Chest pain or SOB.  Lung sounds clear and equal bilat.  No peripheral edema noted  Compliance with meds YES  Pill box filled  x 1 week  Refills needed Jardiance  and Entresto   Meds changes since last visit NONE    Social changes NONE   BP (!) 140/90 (BP Location: Right Arm, Patient Position: Sitting, Cuff Size: Normal)   Pulse 84   Resp 14   Wt 142 lb (64.4 kg)   LMP  (LMP Unknown) Comment: Pt unsure why she no longer gets her period  SpO2 99%   BMI 24.37 kg/m  Weight yesterday- not taken Last visit weight-141lb  Ms. Heintzelman reports to be feeling well.  She denies chest pain or SOB.  Lung sounds clear and equal bilat. No peripheral edema noted.   Med box reconciled x 1 week.  Assisted her with getting refills on her Entresto .  Waiting on prior authorization for her Jardiance .  In the meantime I will pick up samples from the clinic to hopefully hold her over till she can get more refills. Next home visit 02/08/23 @ 10:00  ACTION: Home visit completed  Carlton Chick [phone] 02/01/23  Patient Care Team: Joaquin Mulberry, MD as PCP - General (Family Medicine) Hazle Lites, MD as PCP - Cardiology (Cardiology)  Patient Active Problem List   Diagnosis Date Noted   Nonrheumatic aortic valve insufficiency 04/19/2022   Coronary artery disease 11/20/2021   Elevated troponin 11/19/2021   Heart failure (HCC) 11/19/2021   Acute on chronic systolic heart failure (HCC) 11/18/2021   Chronic systolic heart failure (HCC)    Chest pain, precordial 10/09/2020   Cocaine use 10/09/2020   NSTEMI (non-ST elevated myocardial infarction) (HCC) 10/09/2020   Polysubstance abuse (HCC) 10/09/2020   Hyperlipidemia 10/09/2020   Lung nodule 10/09/2020   Tobacco dependence 10/09/2020   Screening  breast examination 09/04/2018   Heroin dependence (HCC) 06/29/2018   TIA (transient ischemic attack) 06/24/2018   Nail fungus 04/20/2015   Porokeratosis 04/20/2015   History of ulceration 04/20/2015   Foot ulcer (HCC) 02/17/2014   Cellulitis of foot, right 02/17/2014   Chronic pain syndrome 02/17/2014   Essential hypertension 02/17/2014    Current Outpatient Medications:    atorvastatin  (LIPITOR) 40 MG tablet, Take 1 tablet (40 mg total) by mouth daily at 6 PM., Disp: 90 tablet, Rfl: 3   carvedilol  (COREG ) 6.25 MG tablet, Take 1 tablet (6.25 mg total) by mouth 2 (two) times daily., Disp: 180 tablet, Rfl: 3   clopidogrel  (PLAVIX ) 75 MG tablet, Take 1 tablet (75 mg total) by mouth daily., Disp: 90 tablet, Rfl: 3   empagliflozin  (JARDIANCE ) 10 MG TABS tablet, Take 1 tablet (10 mg total) by mouth daily., Disp: 30 tablet, Rfl: 1   ezetimibe  (ZETIA ) 10 MG tablet, Take 1 tablet (10 mg total) by mouth daily., Disp: 90 tablet, Rfl: 3   sacubitril -valsartan  (ENTRESTO ) 24-26 MG, Take 1 tablet by mouth 2 (two) times daily., Disp: 180 tablet, Rfl: 3   spironolactone  (ALDACTONE ) 25 MG tablet, Take 1 tablet (25 mg total) by mouth daily., Disp: 90 tablet, Rfl: 3 No Known Allergies   Social History   Socioeconomic History   Marital status: Single    Spouse name: Not on file   Number of  children: 3   Years of education: Not on file   Highest education level: 12th grade  Occupational History   Not on file  Tobacco Use   Smoking status: Some Days    Current packs/day: 1.00    Types: Cigars, Cigarettes   Smokeless tobacco: Never  Vaping Use   Vaping status: Never Used  Substance and Sexual Activity   Alcohol use: Not Currently    Comment: social drinking   Drug use: Not Currently    Types: Marijuana, Cocaine    Comment: 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   Sexual activity: Not Currently    Birth control/protection: Post-menopausal  Other Topics Concern   Not on file  Social History  Narrative   Not on file   Social Drivers of Health   Financial Resource Strain: High Risk (12/14/2021)   Overall Financial Resource Strain (CARDIA)    Difficulty of Paying Living Expenses: Hard  Food Insecurity: No Food Insecurity (12/14/2021)   Hunger Vital Sign    Worried About Running Out of Food in the Last Year: Never true    Ran Out of Food in the Last Year: Never true  Transportation Needs: No Transportation Needs (12/14/2021)   PRAPARE - Administrator, Civil Service (Medical): No    Lack of Transportation (Non-Medical): No  Physical Activity: Inactive (02/21/2017)   Exercise Vital Sign    Days of Exercise per Week: 0 days    Minutes of Exercise per Session: 0 min  Stress: No Stress Concern Present (02/21/2017)   [employer] of Occupational Health - Occupational Stress Questionnaire    Feeling of Stress : Not at all  Social Connections: Somewhat Isolated (02/21/2017)   Social Connection and Isolation Panel [NHANES]    Frequency of Communication with Friends and Family: More than three times a week    Frequency of Social Gatherings with Friends and Family: Once a week    Attends Religious Services: 1 to 4 times per year    Active Member of [employer] or Organizations: No    Attends Banker Meetings: Never    Marital Status: Never married  Intimate Partner Violence: Not At Risk (11/19/2021)   Humiliation, Afraid, Rape, and Kick questionnaire    Fear of Current or Ex-Partner: No    Emotionally Abused: No    Physically Abused: No    Sexually Abused: No    Physical Exam      Future Appointments  Date Time Provider Department Center  02/01/2023  2:30 PM [facility] PHARMACIST [facility] None  03/09/2023 11:30 AM [facility] PA/NP [facility] None  05/09/2023  1:30 PM Joaquin Mulberry, MD [facility] None

## 2023-02-01 NOTE — Patient Instructions (Addendum)
 Your Results:             Your most recent labs Goal  Total Cholesterol 136 < 200  Triglycerides 56 < 150  HDL (happy/good cholesterol) 51 > 40  LDL (lousy/bad cholesterol 74 < 55   Medication changes:  We will start the process to get Repatha  covered by your insurance.  Once the prior authorization is complete, I will call/send a MyChart message to let you know and confirm pharmacy information.   You will take one injection every 14 days  Lab orders:  We want to repeat labs after 2-3 months.  We will send you a lab order to remind you once we get closer to that time.     Thank you for choosing [facility]

## 2023-02-01 NOTE — Progress Notes (Signed)
Office Visit    Patient Name: Leslie Walker Date of Encounter: 02/01/2023  Primary Care Provider:  Hoy Register, MD Primary Cardiologist:  Chrystie Nose, MD  Chief Complaint    Hyperlipidemia   Significant Past Medical History   CAD 2022 NSTEMI, overlapping DES to mRCA, 70-80% stenosis in LCx, mid LAD 55%  (asa + ticagrelor x 12 months)  CHF HFrEF - 11/23 EF at 20%, up to 55-60% this month  Drug abuse Cocaine, heroin - off for a few months     No Known Allergies  History of Present Illness    Leslie Walker is a 58 y.o. female patient of Dr Shirlee Latch, in the office today to discuss options for cholesterol management.  Insurance Carrier:  [employer] [facility]  LDL Cholesterol goal:  LDL < 55  Current Medications:   atorvastatin 40 mg daily, ezetimibe 10 mg daily  Family Hx:   father MI 41-60's, died (mid 8's), mother died from cancer; brother deceased, 2 sisters htn; 3 kids one son with htn at 44  Social Hx: Tobacco: 2 cigars per day Alcohol: no  Diet:  more home cooked foods, doesn't like greasy foods, some vegetables, not a regular protein in diet;  doesn't snack much  Exercise: walks, on her feet at work, occasionally around neighborhood  Using Paramedicine to help with compliance   Accessory Clinical Findings   Lab Results  Component Value Date   CHOL 136 12/07/2022   HDL 51 12/07/2022   LDLCALC 74 12/07/2022   TRIG 56 12/07/2022   CHOLHDL 2.7 12/07/2022    Lipoprotein (a)  Date/Time Value Ref Range Status  11/23/2021 12:45 AM 294.9 (H) <75.0 nmol/L Final    Comment:    (NOTE) **Results verified by repeat testing** Note:  Values greater than or equal to 75.0 nmol/L may       indicate an independent risk factor for CHD,       but must be evaluated with caution when applied       to non-Caucasian populations due to the       influence of genetic factors on Lp(a) across       ethnicities. Performed At: [facility] [address] [place], [place] [id number] Jolene Schimke MD JJ:[id number]     Lab Results  Component Value Date   ALT 18 11/19/2021   AST 26 11/19/2021   ALKPHOS 53 11/19/2021   BILITOT 0.4 11/19/2021   Lab Results  Component Value Date   CREATININE 0.94 12/07/2022   BUN 11 12/07/2022   NA 137 12/07/2022   K 4.2 12/07/2022   CL 104 12/07/2022   CO2 25 12/07/2022   Lab Results  Component Value Date   HGBA1C 5.6 10/09/2020    Home Medications    Current Outpatient Medications  Medication Sig Dispense Refill   atorvastatin (LIPITOR) 40 MG tablet Take 1 tablet (40 mg total) by mouth daily at 6 PM. 90 tablet 3   carvedilol (COREG) 6.25 MG tablet Take 1 tablet (6.25 mg total) by mouth 2 (two) times daily. 180 tablet 3   clopidogrel (PLAVIX) 75 MG tablet Take 1 tablet (75 mg total) by mouth daily. 90 tablet 3   empagliflozin (JARDIANCE) 10 MG TABS tablet Take 1 tablet (10 mg total) by mouth daily. 30 tablet 1   ezetimibe (ZETIA) 10 MG tablet Take 1 tablet (10 mg total) by mouth daily. 90 tablet 3   sacubitril-valsartan (ENTRESTO) 24-26 MG Take 1  tablet by mouth 2 (two) times daily. 180 tablet 3   spironolactone (ALDACTONE) 25 MG tablet Take 1 tablet (25 mg total) by mouth daily. 90 tablet 3   No current facility-administered medications for this visit.     Assessment & Plan    Hyperlipidemia Assessment: Patient with ASCVD not at LDL goal of < 55 Most recent LDL 74 on 12/07/22 Lp(a) 294 11/24/22 Has been compliant with high intensity statin/ezetimibe : atorvastatin 40, ezetimibe 10 Reviewed options for lowering LDL cholesterol, and hopefully La(a) - PCSK-9 inhibitors.  Discussed mechanisms of action, dosing, side effects, potential decreases in LDL cholesterol and costs.  Also reviewed potential options for patient assistance.  Plan: Patient agreeable to starting Repatha 140 mg q14d Repeat labs after:  3 months Lipid Liver function    Phillips Hay,  PharmD CPP [facility] [address]  [place], [place] [address] [phone]  02/01/2023, 4:31 PM

## 2023-02-08 ENCOUNTER — Other Ambulatory Visit: Payer: Self-pay

## 2023-02-08 ENCOUNTER — Other Ambulatory Visit (HOSPITAL_COMMUNITY): Payer: Self-pay | Admitting: Physician Assistant

## 2023-02-08 ENCOUNTER — Other Ambulatory Visit (HOSPITAL_COMMUNITY): Payer: Self-pay | Admitting: Emergency Medicine

## 2023-02-08 ENCOUNTER — Other Ambulatory Visit (HOSPITAL_COMMUNITY): Payer: Self-pay | Admitting: Internal Medicine

## 2023-02-08 ENCOUNTER — Other Ambulatory Visit (HOSPITAL_COMMUNITY): Payer: Self-pay

## 2023-02-08 MED ORDER — EMPAGLIFLOZIN 10 MG PO TABS
10.0000 mg | ORAL_TABLET | Freq: Every day | ORAL | 3 refills | Status: DC
  Filled 2023-02-08: qty 30, 30d supply, fill #0
  Filled 2023-03-15: qty 30, 30d supply, fill #1
  Filled 2023-05-04 – 2023-05-05 (×2): qty 30, 30d supply, fill #2
  Filled 2023-06-07 (×2): qty 30, 30d supply, fill #3

## 2023-02-08 NOTE — Progress Notes (Signed)
Paramedicine Encounter    Patient ID: Leslie Walker, female    DOB: [birth year], 58 y.o.   MRN: [id number]   Complaints NONE  Assessment A&O x 4, skin W&D w/ good color. Denies chest pain or SOB  Lung sounds clear and equal bilat. No peripheral edema noted.   Compliance with meds Missed multiple doses.    Pill box filled x 2 weeks  Refills needed Ezetimibe, Entresto, Jardiance  Meds changes since last visit NONE    Social changes NONE   BP (!) 140/80 (BP Location: Right Arm, Patient Position: Sitting, Cuff Size: Normal)   Pulse 78   Resp 16   Wt 140 lb (63.5 kg)   LMP  (LMP Unknown) Comment: Pt unsure why she no longer gets her period  SpO2 98%   BMI 24.03 kg/m  Weight yesterday- not taken Last visit weight-142lb  ATF Leslie Walker just getting her day started.  She reports that she if feeling good.  She has has not done well with her med compliance and has missed multiple doses mostly the p.m. doses.  When asked what has gotten her off track she stated, "I've just got a lot of personal stuff going on."  She was not specific as to what exactly but I stressed the importance of getting back on track.  She stated she would try to do better. Med box reconciled x 2 weeks.  Refills called in. Next home visit 1/29 @ 11:00.   ACTION: Home visit completed  Leslie Walker [phone] 02/08/23  Patient Care Team: Hoy Register, MD as PCP - General (Family Medicine) Chrystie Nose, MD as PCP - Cardiology (Cardiology)  Patient Active Problem List   Diagnosis Date Noted   Nonrheumatic aortic valve insufficiency 04/19/2022   Coronary artery disease 11/20/2021   Elevated troponin 11/19/2021   Heart failure (HCC) 11/19/2021   Acute on chronic systolic heart failure (HCC) 11/18/2021   Chronic systolic heart failure (HCC)    Chest pain, precordial 10/09/2020   Cocaine use 10/09/2020   NSTEMI (non-ST elevated myocardial infarction) (HCC) 10/09/2020   Polysubstance  abuse (HCC) 10/09/2020   Hyperlipidemia 10/09/2020   Lung nodule 10/09/2020   Tobacco dependence 10/09/2020   Screening breast examination 09/04/2018   Heroin dependence (HCC) 06/29/2018   TIA (transient ischemic attack) 06/24/2018   Nail fungus 04/20/2015   Porokeratosis 04/20/2015   History of ulceration 04/20/2015   Foot ulcer (HCC) 02/17/2014   Cellulitis of foot, right 02/17/2014   Chronic pain syndrome 02/17/2014   Essential hypertension 02/17/2014    Current Outpatient Medications:    atorvastatin (LIPITOR) 40 MG tablet, Take 1 tablet (40 mg total) by mouth daily at 6 PM., Disp: 90 tablet, Rfl: 3   carvedilol (COREG) 6.25 MG tablet, Take 1 tablet (6.25 mg total) by mouth 2 (two) times daily., Disp: 180 tablet, Rfl: 3   clopidogrel (PLAVIX) 75 MG tablet, Take 1 tablet (75 mg total) by mouth daily., Disp: 90 tablet, Rfl: 3   ezetimibe (ZETIA) 10 MG tablet, Take 1 tablet (10 mg total) by mouth daily., Disp: 90 tablet, Rfl: 3   sacubitril-valsartan (ENTRESTO) 24-26 MG, Take 1 tablet by mouth 2 (two) times daily., Disp: 180 tablet, Rfl: 3   spironolactone (ALDACTONE) 25 MG tablet, Take 1 tablet (25 mg total) by mouth daily., Disp: 90 tablet, Rfl: 3   empagliflozin (JARDIANCE) 10 MG TABS tablet, Take 1 tablet (10 mg total) by mouth daily., Disp: 30 tablet, Rfl: 3 No Known Allergies  Social History   Socioeconomic History   Marital status: Single    Spouse name: Not on file   Number of children: 3   Years of education: Not on file   Highest education level: 12th grade  Occupational History   Not on file  Tobacco Use   Smoking status: Some Days    Current packs/day: 1.00    Types: Cigars, Cigarettes   Smokeless tobacco: Never  Vaping Use   Vaping status: Never Used  Substance and Sexual Activity   Alcohol use: Not Currently    Comment: social drinking   Drug use: Not Currently    Types: Marijuana, Cocaine    Comment: 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   Sexual  activity: Not Currently    Birth control/protection: Post-menopausal  Other Topics Concern   Not on file  Social History Narrative   Not on file   Social Drivers of Health   Financial Resource Strain: High Risk (12/14/2021)   Overall Financial Resource Strain (CARDIA)    Difficulty of Paying Living Expenses: Hard  Food Insecurity: No Food Insecurity (12/14/2021)   Hunger Vital Sign    Worried About Running Out of Food in the Last Year: Never true    Ran Out of Food in the Last Year: Never true  Transportation Needs: No Transportation Needs (12/14/2021)   PRAPARE - Administrator, Civil Service (Medical): No    Lack of Transportation (Non-Medical): No  Physical Activity: Inactive (02/21/2017)   Exercise Vital Sign    Days of Exercise per Week: 0 days    Minutes of Exercise per Session: 0 min  Stress: No Stress Concern Present (02/21/2017)   [employer] of Occupational Health - Occupational Stress Questionnaire    Feeling of Stress : Not at all  Social Connections: Somewhat Isolated (02/21/2017)   Social Connection and Isolation Panel [NHANES]    Frequency of Communication with Friends and Family: More than three times a week    Frequency of Social Gatherings with Friends and Family: Once a week    Attends Religious Services: 1 to 4 times per year    Active Member of [employer] or Organizations: No    Attends Banker Meetings: Never    Marital Status: Never married  Intimate Partner Violence: Not At Risk (11/19/2021)   Humiliation, Afraid, Rape, and Kick questionnaire    Fear of Current or Ex-Partner: No    Emotionally Abused: No    Physically Abused: No    Sexually Abused: No    Physical Exam      Future Appointments  Date Time Provider Department Center  03/09/2023 11:30 AM [facility] PA/NP [facility] None  05/09/2023  1:30 PM Hoy Register, MD [facility] None

## 2023-02-10 ENCOUNTER — Other Ambulatory Visit: Payer: Self-pay

## 2023-02-10 ENCOUNTER — Other Ambulatory Visit (HOSPITAL_COMMUNITY): Payer: Self-pay

## 2023-02-10 MED ORDER — ENTRESTO 24-26 MG PO TABS
1.0000 | ORAL_TABLET | Freq: Two times a day (BID) | ORAL | 3 refills | Status: DC
  Filled 2023-02-10: qty 180, 90d supply, fill #0

## 2023-02-16 ENCOUNTER — Other Ambulatory Visit: Payer: Self-pay

## 2023-02-16 ENCOUNTER — Other Ambulatory Visit (HOSPITAL_COMMUNITY): Payer: Self-pay | Admitting: Emergency Medicine

## 2023-02-16 NOTE — Progress Notes (Signed)
Med rec only  Delivered Windy Fast, EMT-Paramedic [phone] 02/16/2023

## 2023-03-01 ENCOUNTER — Telehealth (HOSPITAL_COMMUNITY): Payer: Self-pay | Admitting: Emergency Medicine

## 2023-03-01 NOTE — Telephone Encounter (Signed)
Attempted to reach by phone w/ no answer.  LVM for Ms. Rittenhouse to return my call.    Beatrix Shipper, EMT-Paramedic [phone] 03/01/2023

## 2023-03-03 ENCOUNTER — Other Ambulatory Visit (HOSPITAL_COMMUNITY): Payer: Self-pay | Admitting: Emergency Medicine

## 2023-03-03 NOTE — Progress Notes (Signed)
Med rec only.  Pt had to work today.  Refills: Ezetimibe, Cleda Daub Med box refilled x 2 weeks    Beatrix Shipper, EMT-Paramedic [phone] 03/03/2023

## 2023-03-06 NOTE — H&P (View-Only) (Signed)
 Advanced Heart Failure Clinic Note   PCP: Hoy Register, MD HF Cardiologist: Dr. Shirlee Latch  Leslie Walker is a 58 y.o. with a history of HTN, hyperlipidemia, stroke cypotogenic 2020/LINQ placement, CAD, polysubstance abuse heroin/cocaine abuse, and chronic HFrEF.    Admitted 09/2020 with NSTEMI. CT negative for PE but did show multivessel coronary calcium. Echo showed EF 45-50%, RV normal, moderate aortic valve stenosis. Had cath with overlapping DES to mid RCA, also with 70-80% left circumflex, left main patent, mid LAD 55%. Plan for DAPT with Aspirin + ticagrelor  x 12 months.     Admitted 11/23 with CHF.  3 months PTA she had run out of all medications. Says she didn't call for refills.   Last used cocaine 7 days prior to admission and heroin about 2 weeks prior. She was diuresed with IV lasix and GDMT titrated. She was enrolled in paramedicine program. Echo with EF 20-25%, RV okay, moderate to severe AI with possible mobile mass. TEE showed EF 20%, mildly reduced RV, bicuspid aortic valve with moderate to severe AI.  R/LHC in 11/23 showed 75% mid LCx stenosis, elevated PCWP and LVEDP but normal RV pressure, moderate pulmonary venous hypertension, CI low by thermo but preserved by Fick.  cMRI in 11/23 showed LVEF 18%, RVEF 48%, bicuspid aortic valve with severe AI, mid-wall LGE in inferolateral wall could be due to prior myocarditis but patient has CAD in LCx chronically.  Structural heart team reviewed her studies, not felt to be a candidate for TAVR.  Echo 3/24: EF 50-55%, normal RV, bicuspid aortic valve with moderate AS and probably moderate AI.  TEE (4/24) showed LVEF 55%, normal RV, bicuspid AoV with fused noncoronary and right coronary cusps, mild-moderate AS with mean gradient 19 and AVA 1.57 cm^2, moderate to severe AI.  Echo 1/25: EF 55-60%, no RWMA, RV normal, small pericardial effusion present, trivial MR, bicuspic AV, AV regurgitation mod-severe. Mild-mod AV stenosis. AV mean gradient 11  mmHg  Today she returns for AHF follow up. Overall feeling ok just tired. Denies palpitations, CP, dizziness, edema, or PND/Orthopnea. No SOB. Appetite not too good, does not watch what she eats. No fever or chills. Weight at home 141 pounds. Takes medications but misses doses occasionally. Denies ETOH, smokes 2 black and mild's a day. 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    ECG (personally reviewed from 11/24): NSR with septal Qs   PMH: 1. Hyperlipidemia 2. CVA: 2020, cryptogenic.  LINQ monitor placed.  3. Polysubstance abuse: Cocaine, heroin.  4. CAD: NSTEMI 9/22 with DES to mid RCA.  - LHC (11/23): 75% mLCx, 50% pLAD.  5. Bicuspid aortic valve disorder:  - CTA chest 9/22 with no thoracic aortic aneurysm.  - TEE (11/23): Moderate-severe AI - Echo (3/24): Moderate AS with mean gradient 18/AVA 1.04 cm^2; moderate AI.  6. Chronic systolic CHF: Nonischemic cardiomyopathy.  - RHC (11/23): mean RA 6, PA 51/29 mean 39, mean PCWP 32, CI 2.4 Fick with PVR 1.7 WU - cMRI (11/23): Moderate LV dilation with EF 18%, RV EF 48%, bicuspid aortic valve with severe AI, non-coronary LGE pattern with mid-wall LGE in the inferolateral wall (?prior myocarditis) though patient does have CAD in LCx.   - Echo (3/24): EF 50-55%, normal RV, bicuspid aortic valve with moderate AS and probably moderate AI.  - TEE (4/24): LVEF 55%, normal RV, no LAA thrombus, PFO or ASD, bicuspid AoV withfused noncoronary and right coronary cusps, mild-moderate AS with mean gradient 19 and AVA 1.57 cm^2,  moderate to severe AI.  ROS: All systems negative except as listed in HPI, PMH and Problem List.  Social History   Socioeconomic History   Marital status: Single    Spouse name: Not on file   Number of children: 3   Years of education: Not on file   Highest education level: 12th grade  Occupational History   Not on file  Tobacco Use   Smoking status: Some Days    Current packs/day: 1.00    Types: Cigars, Cigarettes    Smokeless tobacco: Never  Vaping Use   Vaping status: Never Used  Substance and Sexual Activity   Alcohol use: Not Currently    Comment: social drinking   Drug use: Not Currently    Types: Marijuana, Cocaine    Comment: 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   Sexual activity: Not Currently    Birth control/protection: Post-menopausal  Other Topics Concern   Not on file  Social History Narrative   Not on file   Social Drivers of Health   Financial Resource Strain: High Risk (12/14/2021)   Overall Financial Resource Strain (CARDIA)    Difficulty of Paying Living Expenses: Hard  Food Insecurity: No Food Insecurity (12/14/2021)   Hunger Vital Sign    Worried About Running Out of Food in the Last Year: Never true    Ran Out of Food in the Last Year: Never true  Transportation Needs: No Transportation Needs (12/14/2021)   PRAPARE - Administrator, Civil Service (Medical): No    Lack of Transportation (Non-Medical): No  Physical Activity: Inactive (02/21/2017)   Exercise Vital Sign    Days of Exercise per Week: 0 days    Minutes of Exercise per Session: 0 min  Stress: No Stress Concern Present (02/21/2017)   [employer] of Occupational Health - Occupational Stress Questionnaire    Feeling of Stress : Not at all  Social Connections: Somewhat Isolated (02/21/2017)   Social Connection and Isolation Panel [NHANES]    Frequency of Communication with Friends and Family: More than three times a week    Frequency of Social Gatherings with Friends and Family: Once a week    Attends Religious Services: 1 to 4 times per year    Active Member of [employer] or Organizations: No    Attends Banker Meetings: Never    Marital Status: Never married  Intimate Partner Violence: Not At Risk (11/19/2021)   Humiliation, Afraid, Rape, and Kick questionnaire    Fear of Current or Ex-Partner: No    Emotionally Abused: No    Physically Abused: No    Sexually Abused: No   FH:  Family  History  Problem Relation Age of Onset   Hypertension Mother    Hypertension Father    Hypertension Brother    Stroke Sister    Past Medical History:  Diagnosis Date   Chronic systolic heart failure (HCC)    Cocaine abuse (HCC)    Dyslipidemia    Hypertension    Noncompliance w/medication treatment due to intermit use of medication    Stroke (HCC) 2021   no deficits   Current Outpatient Medications  Medication Sig Dispense Refill   atorvastatin (LIPITOR) 40 MG tablet Take 1 tablet (40 mg total) by mouth daily at 6 PM. 90 tablet 3   carvedilol (COREG) 6.25 MG tablet Take 1 tablet (6.25 mg total) by mouth 2 (two) times daily. 180 tablet 3   clopidogrel (PLAVIX) 75 MG tablet Take 1  tablet (75 mg total) by mouth daily. 90 tablet 3   empagliflozin (JARDIANCE) 10 MG TABS tablet Take 1 tablet (10 mg total) by mouth daily. 30 tablet 3   ezetimibe (ZETIA) 10 MG tablet Take 1 tablet (10 mg total) by mouth daily. 90 tablet 3   sacubitril-valsartan (ENTRESTO) 24-26 MG Take 1 tablet by mouth 2 (two) times daily. 180 tablet 3   spironolactone (ALDACTONE) 25 MG tablet Take 1 tablet (25 mg total) by mouth daily. 90 tablet 3   No current facility-administered medications for this encounter.   BP (!) 150/80   Pulse 86   Wt 66.7 kg (147 lb)   LMP  (LMP Unknown) Comment: Pt unsure why she no longer gets her period  SpO2 99%   BMI 25.23 kg/m   Wt Readings from Last 3 Encounters:  03/09/23 66.7 kg (147 lb)  02/08/23 63.5 kg (140 lb)  02/01/23 64.4 kg (142 lb)   PHYSICAL EXAM: General:  well appearing.  No respiratory difficulty. Walked into clinic HEENT: normal Neck: supple. JVD flat. Carotids 2+ bilat; no bruits. No lymphadenopathy or thyromegaly appreciated. Cor: PMI nondisplaced. Regular rate & rhythm. No rubs, gallops. 2/6 RUSB murmur. Lungs: clear Abdomen: soft, nontender, nondistended. No hepatosplenomegaly. No bruits or masses. Good bowel sounds. Extremities: no cyanosis, clubbing,  rash, edema  Neuro: alert & oriented x 3, cranial nerves grossly intact. moves all 4 extremities w/o difficulty. Affect pleasant.   ASSESSMENT & PLAN:  1. Chronic systolic CHF: Nonischemic CMP.  Echo 11/23 with EF 20-25%, global hypokinesis, moderate LV dilation, normal RV, mod-severe AI.  RHC showed significantly elevated PCWP and LVEDP but normal RA pressure; CI 2.4 Fick/2.0 thermo.  Cause of cardiomyopathy uncertain, CAD does not appear to have progressed (75% mLCx stenosis on 11/23 cath).  Possibly some component of substance abuse (cocaine) though by cardiac MRI in 11/23, prior myocarditis is also a concern (non-coronary LGE noted).  LV EF 18% on cMRI with relatively preserved RV function.  Aortic insufficiency also likely plays a role (moderate to severe on TEE in 11/23).  Repeat echo 3/24 showed improvement with EF 50-55%, normal RV, bicuspid aortic valve with moderate AS and moderate AI.  Improvement in EF suggests a role for cocaine in her cardiomyopathy (she had quit using drugs).   - She is not volume overloaded on exam, NYHA class I-II.  - Continue Entresto 24/26 bid. BMET/BNP today. Patient requested it not be increased today 2/2 dizziness.  - Continue Coreg 6.25 mg bid.    - Continue Jardiance 10 mg daily. Denies GU symptoms.  - Continue spironolactone 25 mg daily.   2. CAD: DES to RCA in 9/22.  Cath in 11/23 with patent RCA stent, 50% proximal LAD, 75% mid LCx.  The LCx stenosis appeared similar to the prior cath, no intervention on LCx . No chest pain. Lp(a) markedly elevated with age-advanced CAD.  - Continue Plavix 75 daily.  - Continue atorvastatin. - With elevated Lp(a), will need to be aggressive with lipid management.  LDL 74 11/24, would like her to start on Repatha. Has been referred and process started. Will check status today.   3. Aortic valve disorder: Moderate-severe AI on 11/23 echo and TEE. TEE showed bicuspid aortic valve with fusion of right and noncoronary cusps.   Moderate-severe AI by TEE.  By cardiac MRI, aortic valve regurgitant fraction was 38% which suggests severe AI.  She was deemed not to be a TAVR candidate, but with improvement in LV function,  she should be SAVR candidate. TEE in 4/24 showed mild-moderate AS with mean gradient 19 and AVA 1.57 cm^2, moderate to severe AI.  - Echo 1/25: bicuspic AV, AV regurgitation mod-severe. Mild-mod AV stenosis. AV mean gradient 11 mmHg  - Valvular disease remains significant on repeat echo. Plan to refer to Dr. Leafy Ro for SAVR.  She will need RHC/LHC prior to valve replacement.  Will arrange.  Informed Consent   Shared Decision Making/Informed Consent The risks [stroke (1 in 1000), death (1 in 1000), kidney failure [usually temporary] (1 in 500), bleeding (1 in 200), allergic reaction [possibly serious] (1 in 200)], benefits (diagnostic support and management of coronary artery disease) and alternatives of a cardiac catheterization were discussed in detail with Leslie. Engelbrecht and she is willing to proceed.     - Recommended screening in 1st degree relatives for bicuspid aortic valve  4. Polysubstance abuse: Cocaine/heroin.  She reports abstinence and EF has improved.   5. H/o CVA: She is on Plavix.   6. Smoking: Smokes a few cigars/week. Continue to strongly encourage her to quit totally.   7. SDOH: Continue paramedicine, appreciate their assistance.  Follow up in 2-3 months with Dr. Shirlee Latch.   Alen Bleacher AGACNP-BC  03/09/2023

## 2023-03-06 NOTE — Progress Notes (Incomplete)
Advanced Heart Failure Clinic Note   PCP: Hoy Register, MD HF Cardiologist: Dr. Shirlee Latch  Leslie Walker is a 58 y.o. with a history of HTN, hyperlipidemia, stroke cypotogenic 2020/LINQ placement, CAD, polysubstance abuse heroin/cocaine abuse, and chronic HFrEF.    Admitted 09/2020 with NSTEMI. CT negative for PE but did show multivessel coronary calcium. Echo showed EF 45-50%, RV normal, moderate aortic valve stenosis. Had cath with overlapping DES to mid RCA, also with 70-80% left circumflex, left main patent, mid LAD 55%. Plan for DAPT with Aspirin + ticagrelor  x 12 months.     Admitted 11/23 with CHF.  3 months PTA she had run out of all medications. Says she didn't call for refills.   Last used cocaine 7 days prior to admission and heroin about 2 weeks prior. She was diuresed with IV lasix and GDMT titrated. She was enrolled in paramedicine program. Echo with EF 20-25%, RV okay, moderate to severe AI with possible mobile mass. TEE showed EF 20%, mildly reduced RV, bicuspid aortic valve with moderate to severe AI.  R/LHC in 11/23 showed 75% mid LCx stenosis, elevated PCWP and LVEDP but normal RV pressure, moderate pulmonary venous hypertension, CI low by thermo but preserved by Fick.  cMRI in 11/23 showed LVEF 18%, RVEF 48%, bicuspid aortic valve with severe AI, mid-wall LGE in inferolateral wall could be due to prior myocarditis but patient has CAD in LCx chronically.  Structural heart team reviewed her studies, not felt to be a candidate for TAVR.  Echo 3/24: EF 50-55%, normal RV, bicuspid aortic valve with moderate AS and probably moderate AI.  TEE (4/24) showed LVEF 55%, normal RV, bicuspid AoV with fused noncoronary and right coronary cusps, mild-moderate AS with mean gradient 19 and AVA 1.57 cm^2, moderate to severe AI.  Echo 1/25: EF 55-60%, no RWMA, RV normal, small pericardial effusion present, trivial MR, bicuspic AV, AV regurgitation mod-severe. Mild-mod AV stenosis. AV mean gradient 11  mmHg  Today she returns for AHF follow up. Overall feeling ok just tired. Denies palpitations, CP, dizziness, edema, or PND/Orthopnea. No SOB. Appetite not too good, does not watch what she eats. No fever or chills. Weight at home 141 pounds. Takes medications but misses doses occasionally. Denies ETOH, smokes 2 black and mild's a day. 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    ECG (personally reviewed from 11/24): NSR with septal Qs   PMH: 1. Hyperlipidemia 2. CVA: 2020, cryptogenic.  LINQ monitor placed.  3. Polysubstance abuse: Cocaine, heroin.  4. CAD: NSTEMI 9/22 with DES to mid RCA.  - LHC (11/23): 75% mLCx, 50% pLAD.  5. Bicuspid aortic valve disorder:  - CTA chest 9/22 with no thoracic aortic aneurysm.  - TEE (11/23): Moderate-severe AI - Echo (3/24): Moderate AS with mean gradient 18/AVA 1.04 cm^2; moderate AI.  6. Chronic systolic CHF: Nonischemic cardiomyopathy.  - RHC (11/23): mean RA 6, PA 51/29 mean 39, mean PCWP 32, CI 2.4 Fick with PVR 1.7 WU - cMRI (11/23): Moderate LV dilation with EF 18%, RV EF 48%, bicuspid aortic valve with severe AI, non-coronary LGE pattern with mid-wall LGE in the inferolateral wall (?prior myocarditis) though patient does have CAD in LCx.   - Echo (3/24): EF 50-55%, normal RV, bicuspid aortic valve with moderate AS and probably moderate AI.  - TEE (4/24): LVEF 55%, normal RV, no LAA thrombus, PFO or ASD, bicuspid AoV withfused noncoronary and right coronary cusps, mild-moderate AS with mean gradient 19 and AVA 1.57 cm^2,  moderate to severe AI.  ROS: All systems negative except as listed in HPI, PMH and Problem List.  Social History   Socioeconomic History   Marital status: Single    Spouse name: Not on file   Number of children: 3   Years of education: Not on file   Highest education level: 12th grade  Occupational History   Not on file  Tobacco Use   Smoking status: Some Days    Current packs/day: 1.00    Types: Cigars, Cigarettes    Smokeless tobacco: Never  Vaping Use   Vaping status: Never Used  Substance and Sexual Activity   Alcohol use: Not Currently    Comment: social drinking   Drug use: Not Currently    Types: Marijuana, Cocaine    Comment: 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   Sexual activity: Not Currently    Birth control/protection: Post-menopausal  Other Topics Concern   Not on file  Social History Narrative   Not on file   Social Drivers of Health   Financial Resource Strain: High Risk (12/14/2021)   Overall Financial Resource Strain (CARDIA)    Difficulty of Paying Living Expenses: Hard  Food Insecurity: No Food Insecurity (12/14/2021)   Hunger Vital Sign    Worried About Running Out of Food in the Last Year: Never true    Ran Out of Food in the Last Year: Never true  Transportation Needs: No Transportation Needs (12/14/2021)   PRAPARE - Administrator, Civil Service (Medical): No    Lack of Transportation (Non-Medical): No  Physical Activity: Inactive (02/21/2017)   Exercise Vital Sign    Days of Exercise per Week: 0 days    Minutes of Exercise per Session: 0 min  Stress: No Stress Concern Present (02/21/2017)   [employer] of Occupational Health - Occupational Stress Questionnaire    Feeling of Stress : Not at all  Social Connections: Somewhat Isolated (02/21/2017)   Social Connection and Isolation Panel [NHANES]    Frequency of Communication with Friends and Family: More than three times a week    Frequency of Social Gatherings with Friends and Family: Once a week    Attends Religious Services: 1 to 4 times per year    Active Member of [employer] or Organizations: No    Attends Banker Meetings: Never    Marital Status: Never married  Intimate Partner Violence: Not At Risk (11/19/2021)   Humiliation, Afraid, Rape, and Kick questionnaire    Fear of Current or Ex-Partner: No    Emotionally Abused: No    Physically Abused: No    Sexually Abused: No   FH:  Family  History  Problem Relation Age of Onset   Hypertension Mother    Hypertension Father    Hypertension Brother    Stroke Sister    Past Medical History:  Diagnosis Date   Chronic systolic heart failure (HCC)    Cocaine abuse (HCC)    Dyslipidemia    Hypertension    Noncompliance w/medication treatment due to intermit use of medication    Stroke (HCC) 2021   no deficits   Current Outpatient Medications  Medication Sig Dispense Refill   atorvastatin (LIPITOR) 40 MG tablet Take 1 tablet (40 mg total) by mouth daily at 6 PM. 90 tablet 3   carvedilol (COREG) 6.25 MG tablet Take 1 tablet (6.25 mg total) by mouth 2 (two) times daily. 180 tablet 3   clopidogrel (PLAVIX) 75 MG tablet Take 1  tablet (75 mg total) by mouth daily. 90 tablet 3   empagliflozin (JARDIANCE) 10 MG TABS tablet Take 1 tablet (10 mg total) by mouth daily. 30 tablet 3   ezetimibe (ZETIA) 10 MG tablet Take 1 tablet (10 mg total) by mouth daily. 90 tablet 3   sacubitril-valsartan (ENTRESTO) 24-26 MG Take 1 tablet by mouth 2 (two) times daily. 180 tablet 3   spironolactone (ALDACTONE) 25 MG tablet Take 1 tablet (25 mg total) by mouth daily. 90 tablet 3   No current facility-administered medications for this encounter.   BP (!) 150/80   Pulse 86   Wt 66.7 kg (147 lb)   LMP  (LMP Unknown) Comment: Pt unsure why she no longer gets her period  SpO2 99%   BMI 25.23 kg/m   Wt Readings from Last 3 Encounters:  03/09/23 66.7 kg (147 lb)  02/08/23 63.5 kg (140 lb)  02/01/23 64.4 kg (142 lb)   PHYSICAL EXAM: General:  well appearing.  No respiratory difficulty. Walked into clinic HEENT: normal Neck: supple. JVD flat. Carotids 2+ bilat; no bruits. No lymphadenopathy or thyromegaly appreciated. Cor: PMI nondisplaced. Regular rate & rhythm. No rubs, gallops. 2/6 RUSB murmur. Lungs: clear Abdomen: soft, nontender, nondistended. No hepatosplenomegaly. No bruits or masses. Good bowel sounds. Extremities: no cyanosis, clubbing,  rash, edema  Neuro: alert & oriented x 3, cranial nerves grossly intact. moves all 4 extremities w/o difficulty. Affect pleasant.   ASSESSMENT & PLAN:  1. Chronic systolic CHF: Nonischemic CMP.  Echo 11/23 with EF 20-25%, global hypokinesis, moderate LV dilation, normal RV, mod-severe AI.  RHC showed significantly elevated PCWP and LVEDP but normal RA pressure; CI 2.4 Fick/2.0 thermo.  Cause of cardiomyopathy uncertain, CAD does not appear to have progressed (75% mLCx stenosis on 11/23 cath).  Possibly some component of substance abuse (cocaine) though by cardiac MRI in 11/23, prior myocarditis is also a concern (non-coronary LGE noted).  LV EF 18% on cMRI with relatively preserved RV function.  Aortic insufficiency also likely plays a role (moderate to severe on TEE in 11/23).  Repeat echo 3/24 showed improvement with EF 50-55%, normal RV, bicuspid aortic valve with moderate AS and moderate AI.  Improvement in EF suggests a role for cocaine in her cardiomyopathy (she had quit using drugs).   - She is not volume overloaded on exam, NYHA class I-II.  - Continue Entresto 24/26 bid. BMET/BNP today. Patient requested it not be increased today 2/2 dizziness.  - Continue Coreg 6.25 mg bid.    - Continue Jardiance 10 mg daily. Denies GU symptoms.  - Continue spironolactone 25 mg daily.   2. CAD: DES to RCA in 9/22.  Cath in 11/23 with patent RCA stent, 50% proximal LAD, 75% mid LCx.  The LCx stenosis appeared similar to the prior cath, no intervention on LCx . No chest pain. Lp(a) markedly elevated with age-advanced CAD.  - Continue Plavix 75 daily.  - Continue atorvastatin. - With elevated Lp(a), will need to be aggressive with lipid management.  LDL 74 11/24, would like her to start on Repatha. Has been referred and process started. Will check status today.   3. Aortic valve disorder: Moderate-severe AI on 11/23 echo and TEE. TEE showed bicuspid aortic valve with fusion of right and noncoronary cusps.   Moderate-severe AI by TEE.  By cardiac MRI, aortic valve regurgitant fraction was 38% which suggests severe AI.  She was deemed not to be a TAVR candidate, but with improvement in LV function,  she should be SAVR candidate. TEE in 4/24 showed mild-moderate AS with mean gradient 19 and AVA 1.57 cm^2, moderate to severe AI.  - Echo 1/25: bicuspic AV, AV regurgitation mod-severe. Mild-mod AV stenosis. AV mean gradient 11 mmHg  - Valvular disease remains significant on repeat echo. Plan to refer to Dr. Leafy Ro for SAVR.  She will need RHC/LHC prior to valve replacement.  Will arrange.  Informed Consent   Shared Decision Making/Informed Consent The risks [stroke (1 in 1000), death (1 in 1000), kidney failure [usually temporary] (1 in 500), bleeding (1 in 200), allergic reaction [possibly serious] (1 in 200)], benefits (diagnostic support and management of coronary artery disease) and alternatives of a cardiac catheterization were discussed in detail with Leslie Walker and she is willing to proceed.     - Recommended screening in 1st degree relatives for bicuspid aortic valve  4. Polysubstance abuse: Cocaine/heroin.  She reports abstinence and EF has improved.   5. H/o CVA: She is on Plavix.   6. Smoking: Smokes a few cigars/week. Continue to strongly encourage her to quit totally.   7. SDOH: Continue paramedicine, appreciate their assistance.  Follow up in 2-3 months with Dr. Shirlee Latch.   Alen Bleacher AGACNP-BC  03/09/2023

## 2023-03-09 ENCOUNTER — Encounter (HOSPITAL_COMMUNITY): Payer: Self-pay

## 2023-03-09 ENCOUNTER — Ambulatory Visit (HOSPITAL_COMMUNITY)
Admission: RE | Admit: 2023-03-09 | Discharge: 2023-03-09 | Disposition: A | Discharge status: Home / Self Care | Payer: Medicaid Other | Source: Ambulatory Visit | Attending: Internal Medicine

## 2023-03-09 ENCOUNTER — Other Ambulatory Visit (HOSPITAL_COMMUNITY): Payer: Self-pay

## 2023-03-09 VITALS — BP 150/80 | HR 86 | Wt 147.0 lb

## 2023-03-09 DIAGNOSIS — Z8673 Personal history of transient ischemic attack (TIA), and cerebral infarction without residual deficits: Secondary | ICD-10-CM | POA: Insufficient documentation

## 2023-03-09 DIAGNOSIS — I110 Hypertensive heart disease with heart failure: Secondary | ICD-10-CM | POA: Diagnosis present

## 2023-03-09 DIAGNOSIS — I2510 Atherosclerotic heart disease of native coronary artery without angina pectoris: Secondary | ICD-10-CM | POA: Insufficient documentation

## 2023-03-09 DIAGNOSIS — E7841 Elevated Lipoprotein(a): Secondary | ICD-10-CM | POA: Insufficient documentation

## 2023-03-09 DIAGNOSIS — I5022 Chronic systolic (congestive) heart failure: Secondary | ICD-10-CM | POA: Insufficient documentation

## 2023-03-09 DIAGNOSIS — Z79899 Other long term (current) drug therapy: Secondary | ICD-10-CM | POA: Insufficient documentation

## 2023-03-09 DIAGNOSIS — I351 Nonrheumatic aortic (valve) insufficiency: Secondary | ICD-10-CM | POA: Insufficient documentation

## 2023-03-09 DIAGNOSIS — F1910 Other psychoactive substance abuse, uncomplicated: Secondary | ICD-10-CM | POA: Insufficient documentation

## 2023-03-09 DIAGNOSIS — F17210 Nicotine dependence, cigarettes, uncomplicated: Secondary | ICD-10-CM | POA: Diagnosis not present

## 2023-03-09 DIAGNOSIS — I428 Other cardiomyopathies: Secondary | ICD-10-CM | POA: Insufficient documentation

## 2023-03-09 DIAGNOSIS — F17200 Nicotine dependence, unspecified, uncomplicated: Secondary | ICD-10-CM

## 2023-03-09 DIAGNOSIS — Z952 Presence of prosthetic heart valve: Secondary | ICD-10-CM | POA: Insufficient documentation

## 2023-03-09 DIAGNOSIS — Z7902 Long term (current) use of antithrombotics/antiplatelets: Secondary | ICD-10-CM | POA: Insufficient documentation

## 2023-03-09 DIAGNOSIS — F17290 Nicotine dependence, other tobacco product, uncomplicated: Secondary | ICD-10-CM | POA: Insufficient documentation

## 2023-03-09 DIAGNOSIS — Z7984 Long term (current) use of oral hypoglycemic drugs: Secondary | ICD-10-CM | POA: Insufficient documentation

## 2023-03-09 DIAGNOSIS — Z139 Encounter for screening, unspecified: Secondary | ICD-10-CM

## 2023-03-09 DIAGNOSIS — Z955 Presence of coronary angioplasty implant and graft: Secondary | ICD-10-CM | POA: Insufficient documentation

## 2023-03-09 LAB — BASIC METABOLIC PANEL
Anion gap: 13 (ref 5–15)
BUN: 11 mg/dL (ref 6–20)
CO2: 23 mmol/L (ref 22–32)
Calcium: 9.6 mg/dL (ref 8.9–10.3)
Chloride: 106 mmol/L (ref 98–111)
Creatinine, Ser: 0.95 mg/dL (ref 0.44–1.00)
GFR, Estimated: >60 mL/min (ref >60)
Glucose, Bld: 100 mg/dL — ABNORMAL HIGH (ref 70–99)
Potassium: 5 mmol/L (ref 3.5–5.1)
Sodium: 142 mmol/L (ref 135–145)

## 2023-03-09 LAB — CBC
HCT: 38.5 % (ref 36.0–46.0)
Hemoglobin: 14.2 g/dL (ref 12.0–15.0)
MCH: 30.7 pg (ref 26.0–34.0)
MCHC: 36.9 g/dL — ABNORMAL HIGH (ref 30.0–36.0)
MCV: 83.2 fL (ref 80.0–100.0)
Platelets: 243 10*3/uL (ref 150–400)
RBC: 4.63 MIL/uL (ref 3.87–5.11)
RDW: 13.7 % (ref 11.5–15.5)
WBC: 5.7 10*3/uL (ref 4.0–10.5)
nRBC: 0 % (ref 0.0–0.2)

## 2023-03-09 LAB — BRAIN NATRIURETIC PEPTIDE: B Natriuretic Peptide: 87 pg/mL (ref 0.0–100.0)

## 2023-03-09 NOTE — Addendum Note (Signed)
Encounter addended by: Nicole Cella, RN on: 03/09/2023 2:58 PM  Actions taken: Charge Capture section accepted

## 2023-03-09 NOTE — Patient Instructions (Signed)
Lab Work:  Labs done today, your results will be available in MyChart, we will contact you for abnormal readings.   Testing/Procedures:  Your physician has requested that you have a cardiac catheterization. Cardiac catheterization is used to diagnose and/or treat various heart conditions. Doctors may recommend this procedure for a number of different reasons. The most common reason is to evaluate chest pain. Chest pain can be a symptom of coronary artery disease (CAD), and cardiac catheterization can show whether plaque is narrowing or blocking your heart's arteries. This procedure is also used to evaluate the valves, as well as measure the blood flow and oxygen levels in different parts of your heart. For further information see instructions below.   Referral: You have been referred to Dr. Leafy Ro. This office will contact you in order to make an appointment.   Follow-Up in: Please follow up with Dr. Shirlee Latch in 3 months (May). Please call us back in March in order to schedule this appointment.   At the [facility], you and your health needs are our priority. We have a designated team specialized in the treatment of Heart Failure. This Care Team includes your primary Heart Failure Specialized Cardiologist (physician), Advanced Practice Providers (APPs- Physician Assistants and Nurse Practitioners), and Pharmacist who all work together to provide you with the care you need, when you need it.   You may see any of the following providers on your designated Care Team at your next follow up:  Dr. Arvilla Meres Dr. Marca Ancona Dr. Dorthula Nettles Dr. Theresia Bough Tonye Becket, NP Robbie Lis, [place] [facility] [place], [place] Brynda Peon, NP [place] Lee, NP Karle Plumber, PharmD   Please be sure to bring in all your medications bottles to every appointment.   Need to Contact us:  If you have any questions or concerns before your next appointment please send [place]  a message through [place] or call our office at [phone].    TO LEAVE A MESSAGE FOR THE NURSE SELECT OPTION 2, PLEASE LEAVE A MESSAGE INCLUDING: YOUR NAME DATE OF BIRTH CALL BACK NUMBER REASON FOR CALL**this is important as we prioritize the call backs  YOU WILL RECEIVE A CALL BACK THE SAME DAY AS LONG AS YOU CALL BEFORE 4:00 PM      CATHETERIZATION INSTRUCTIONS  Leslie Walker  03/09/2023  You are scheduled for a Cardiac Catheterization on Wednesday, March 19 with Dr. Marca Ancona.  1. Please arrive at the [facility] (Main Entrance A) at [facility]: [address] [place], [place] [address] at 8:30 AM (This time is 1 hour(s) before your procedure to ensure your preparation).   Free valet parking service is available. You will check in at ADMITTING. The support person will be asked to wait in the waiting room.  It is OK to have someone drop you off and come back when you are ready to be discharged.    Special note: Every effort is made to have your procedure done on time. Please understand that emergencies sometimes delay scheduled procedures.  2. Diet: Do not eat solid foods after midnight.  The patient may have clear liquids until 5am upon the day of the procedure.  3. Labs: You will need to have blood drawn on today.  4. Medication instructions in preparation for your procedure:   Contrast Allergy: No  DO NOT TAKE: Jardiance and Spironolactone the morning of March 19th 2025   On the morning of your procedure, take your Plavix/Clopidogrel and  any morning medicines NOT listed above.  You may use sips of water.  5. Plan to go home the same day, you will only stay overnight if medically necessary. 6. Bring a current list of your medications and current insurance cards. 7. You MUST have a responsible person to drive you home. 8. Someone MUST be with you the first 24 hours after you arrive home or your discharge will be delayed. 9. Please wear clothes that are  easy to get on and off and wear slip-on shoes.  Thank you for allowing [place] to care for you!   -- [facility]

## 2023-03-10 ENCOUNTER — Telehealth: Payer: Self-pay | Admitting: Pharmacy Technician

## 2023-03-10 ENCOUNTER — Other Ambulatory Visit (HOSPITAL_COMMUNITY): Payer: Self-pay

## 2023-03-10 ENCOUNTER — Telehealth: Payer: Self-pay | Admitting: Pharmacist Clinician (PhC)/ Clinical Pharmacy Specialist

## 2023-03-10 ENCOUNTER — Other Ambulatory Visit: Payer: Self-pay

## 2023-03-10 MED ORDER — REPATHA SURECLICK 140 MG/ML ~~LOC~~ SOAJ
140.0000 mg | SUBCUTANEOUS | 3 refills | Status: AC
  Filled 2023-03-10: qty 6, 84d supply, fill #0
  Filled 2023-06-14 – 2023-07-07 (×2): qty 6, 84d supply, fill #1
  Filled 2023-10-16: qty 6, 84d supply, fill #2

## 2023-03-10 NOTE — Telephone Encounter (Signed)
Pharmacy Patient Advocate Encounter  Received notification from  [employer]  that Prior Authorization for Repatha has been APPROVED from 03/10/23 to 03/09/24. Ran test claim, Copay is $4.00- one month. This test claim was processed through [facility]- copay amounts may vary at other pharmacies due to pharmacy/plan contracts, or as the patient moves through the different stages of their insurance plan.   PA #/Case ID/Reference #: [id number]

## 2023-03-10 NOTE — Addendum Note (Signed)
Addended by: Rosalee Kaufman on: 03/10/2023 03:23 PM   Modules accepted: Orders

## 2023-03-10 NOTE — Telephone Encounter (Signed)
Pharmacy Patient Advocate Encounter   Received notification from Pt Calls Messages that prior authorization for Repatha is required/requested.   Insurance verification completed.   The patient is insured through  [employer]  .   Per test claim: PA required; PA submitted to above mentioned insurance via CoverMyMeds Key/confirmation #/EOC [id number] Status is pending

## 2023-03-10 NOTE — Telephone Encounter (Signed)
 Please do Repatha PA

## 2023-03-15 ENCOUNTER — Other Ambulatory Visit (HOSPITAL_COMMUNITY): Payer: Self-pay | Admitting: Emergency Medicine

## 2023-03-15 ENCOUNTER — Other Ambulatory Visit: Payer: Self-pay

## 2023-03-15 NOTE — Progress Notes (Signed)
 Paramedicine Encounter    Patient ID: Leslie Walker, female    DOB: [birth year], 57 y.o.   MRN: [id number]   Complaints NONE   Assessment A&O x 4, skin W&D w/ good color.  Pt denies chest pain or SOB.  Lung sounds clear and equl bilat and no peripheral edema noted.  Compliance with meds missed 2 doses a.m.  Pill box filled x 2 weeks  Refills needed Jardiance, Clopidogrel, Spironolactone, Repatha  Meds changes since last visit NONE    Social changes NONE   BP 130/80 (BP Location: Left Arm, Patient Position: Sitting, Cuff Size: Normal)   Pulse 88   Resp 16   Wt 142 lb 9.6 oz (64.7 kg)   LMP  (LMP Unknown) Comment: Pt unsure why she no longer gets her period  SpO2 98%   BMI 24.48 kg/m  Weight yesterday- not taken Last visit weight-147lb  ATF Leslie Walker reporting to be "doing well."  She was cleaning house on my arrival.  Pt. A&O x 4, skin W&D w/ good color.  Denies chest pain or SOB and no peripheral edema noted.  She states he missed 2 a.m. doses of her med regimen.  She states she's been working a lot lately and that contributed to her getting off track. Pill box reconciled x 2 weeks.  Refills called in and will be ready tomorrow for pick up. Next home visit 03/29/23.  ACTION: Home visit completed  [name] [phone] 03/15/23  Patient Care Team: Hoy Register, MD as PCP - General (Family Medicine) Chrystie Nose, MD as PCP - Cardiology (Cardiology)  Patient Active Problem List   Diagnosis Date Noted   Nonrheumatic aortic valve insufficiency 04/19/2022   Coronary artery disease 11/20/2021   Elevated troponin 11/19/2021   Heart failure (HCC) 11/19/2021   Acute on chronic systolic heart failure (HCC) 11/18/2021   Chronic systolic heart failure (HCC)    Chest pain, precordial 10/09/2020   Cocaine use 10/09/2020   NSTEMI (non-ST elevated myocardial infarction) (HCC) 10/09/2020   Polysubstance abuse (HCC) 10/09/2020   Hyperlipidemia 10/09/2020    Lung nodule 10/09/2020   Tobacco dependence 10/09/2020   Screening breast examination 09/04/2018   Heroin dependence (HCC) 06/29/2018   TIA (transient ischemic attack) 06/24/2018   Nail fungus 04/20/2015   Porokeratosis 04/20/2015   History of ulceration 04/20/2015   Foot ulcer (HCC) 02/17/2014   Cellulitis of foot, right 02/17/2014   Chronic pain syndrome 02/17/2014   Essential hypertension 02/17/2014    Current Outpatient Medications:    atorvastatin (LIPITOR) 40 MG tablet, Take 1 tablet (40 mg total) by mouth daily at 6 PM., Disp: 90 tablet, Rfl: 3   carvedilol (COREG) 6.25 MG tablet, Take 1 tablet (6.25 mg total) by mouth 2 (two) times daily., Disp: 180 tablet, Rfl: 3   clopidogrel (PLAVIX) 75 MG tablet, Take 1 tablet (75 mg total) by mouth daily., Disp: 90 tablet, Rfl: 3   empagliflozin (JARDIANCE) 10 MG TABS tablet, Take 1 tablet (10 mg total) by mouth daily., Disp: 30 tablet, Rfl: 3   ezetimibe (ZETIA) 10 MG tablet, Take 1 tablet (10 mg total) by mouth daily., Disp: 90 tablet, Rfl: 3   sacubitril-valsartan (ENTRESTO) 24-26 MG, Take 1 tablet by mouth 2 (two) times daily., Disp: 180 tablet, Rfl: 3   spironolactone (ALDACTONE) 25 MG tablet, Take 1 tablet (25 mg total) by mouth daily., Disp: 90 tablet, Rfl: 3   Evolocumab (REPATHA SURECLICK) 140 MG/ML SOAJ, Inject 140 mg into the skin  every 14 (fourteen) days. (Patient not taking: Reported on 03/15/2023), Disp: 6 mL, Rfl: 3 No Known Allergies   Social History   Socioeconomic History   Marital status: Single    Spouse name: Not on file   Number of children: 3   Years of education: Not on file   Highest education level: 12th grade  Occupational History   Not on file  Tobacco Use   Smoking status: Some Days    Current packs/day: 1.00    Types: Cigars, Cigarettes   Smokeless tobacco: Never  Vaping Use   Vaping status: Never Used  Substance and Sexual Activity   Alcohol use: Not Currently    Comment: social drinking    Drug use: Not Currently    Types: Marijuana, Cocaine    Comment: 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   Sexual activity: Not Currently    Birth control/protection: Post-menopausal  Other Topics Concern   Not on file  Social History Narrative   Not on file   Social Drivers of Health   Financial Resource Strain: High Risk (12/14/2021)   Overall Financial Resource Strain (CARDIA)    Difficulty of Paying Living Expenses: Hard  Food Insecurity: No Food Insecurity (12/14/2021)   Hunger Vital Sign    Worried About Running Out of Food in the Last Year: Never true    Ran Out of Food in the Last Year: Never true  Transportation Needs: No Transportation Needs (12/14/2021)   PRAPARE - Administrator, Civil Service (Medical): No    Lack of Transportation (Non-Medical): No  Physical Activity: Inactive (02/21/2017)   Exercise Vital Sign    Days of Exercise per Week: 0 days    Minutes of Exercise per Session: 0 min  Stress: No Stress Concern Present (02/21/2017)   [employer] of Occupational Health - Occupational Stress Questionnaire    Feeling of Stress : Not at all  Social Connections: Somewhat Isolated (02/21/2017)   Social Connection and Isolation Panel [NHANES]    Frequency of Communication with Friends and Family: More than three times a week    Frequency of Social Gatherings with Friends and Family: Once a week    Attends Religious Services: 1 to 4 times per year    Active Member of [employer] or Organizations: No    Attends Banker Meetings: Never    Marital Status: Never married  Intimate Partner Violence: Not At Risk (11/19/2021)   Humiliation, Afraid, Rape, and Kick questionnaire    Fear of Current or Ex-Partner: No    Emotionally Abused: No    Physically Abused: No    Sexually Abused: No    Physical Exam      Future Appointments  Date Time Provider Department Center  03/30/2023  9:00 AM Eugenio Hoes, MD [facility]  05/09/2023  1:30 PM Hoy Register, MD [facility] None

## 2023-03-21 ENCOUNTER — Other Ambulatory Visit: Payer: Self-pay

## 2023-03-29 ENCOUNTER — Other Ambulatory Visit (HOSPITAL_COMMUNITY): Payer: Self-pay | Admitting: Emergency Medicine

## 2023-03-29 NOTE — Progress Notes (Signed)
 Paramedicine Encounter    Patient ID: Leslie Walker, female    DOB: 12-04-1965, 58 y.o.   MRN: [id number]   Complaints NONE  Assessment A&O x 4, skin W&D w/ good color.  Denies chest pain or SOB.  No peripheral edema noted. Pt. BP is elevated  and pt has not yet taken her morning meds yet.  Compliance with medsYES  Pill box filled x 2 weeks  Refills needed Spironolactone called in.  Meds changes since last visit NONE    Social changes NONE  Weight yesterday-not taken Last visit weight-142lb  ATF Ms. Lefeber A&O x 4, skin W&D w/ good color.  Pt denies chest pain or SOB.  Lung sounds clear and equal throughout and no peripheral edema noted.  Med box reconciled x 2 weeks.  Pt. States she has not taken her Repatha yet because she has been unsure of how to administer the med.  I shared a video on Repatha administration and witnessed pt accurately administering her first dose.    ACTION: Home visit completed  [name] [phone] 03/29/23  Patient Care Team: Hoy Register, MD as PCP - General (Family Medicine) Chrystie Nose, MD as PCP - Cardiology (Cardiology)  Patient Active Problem List   Diagnosis Date Noted   Nonrheumatic aortic valve insufficiency 04/19/2022   Coronary artery disease 11/20/2021   Elevated troponin 11/19/2021   Heart failure (HCC) 11/19/2021   Acute on chronic systolic heart failure (HCC) 11/18/2021   Chronic systolic heart failure (HCC)    Chest pain, precordial 10/09/2020   Cocaine use 10/09/2020   NSTEMI (non-ST elevated myocardial infarction) (HCC) 10/09/2020   Polysubstance abuse (HCC) 10/09/2020   Hyperlipidemia 10/09/2020   Lung nodule 10/09/2020   Tobacco dependence 10/09/2020   Screening breast examination 09/04/2018   Heroin dependence (HCC) 06/29/2018   TIA (transient ischemic attack) 06/24/2018   Nail fungus 04/20/2015   Porokeratosis 04/20/2015   History of ulceration 04/20/2015   Foot ulcer (HCC) 02/17/2014    Cellulitis of foot, right 02/17/2014   Chronic pain syndrome 02/17/2014   Essential hypertension 02/17/2014    Current Outpatient Medications:    atorvastatin (LIPITOR) 40 MG tablet, Take 1 tablet (40 mg total) by mouth daily at 6 PM., Disp: 90 tablet, Rfl: 3   carvedilol (COREG) 6.25 MG tablet, Take 1 tablet (6.25 mg total) by mouth 2 (two) times daily., Disp: 180 tablet, Rfl: 3   clopidogrel (PLAVIX) 75 MG tablet, Take 1 tablet (75 mg total) by mouth daily., Disp: 90 tablet, Rfl: 3   empagliflozin (JARDIANCE) 10 MG TABS tablet, Take 1 tablet (10 mg total) by mouth daily., Disp: 30 tablet, Rfl: 3   Evolocumab (REPATHA SURECLICK) 140 MG/ML SOAJ, Inject 140 mg into the skin every 14 (fourteen) days. (Patient not taking: Reported on 03/15/2023), Disp: 6 mL, Rfl: 3   ezetimibe (ZETIA) 10 MG tablet, Take 1 tablet (10 mg total) by mouth daily., Disp: 90 tablet, Rfl: 3   sacubitril-valsartan (ENTRESTO) 24-26 MG, Take 1 tablet by mouth 2 (two) times daily., Disp: 180 tablet, Rfl: 3   spironolactone (ALDACTONE) 25 MG tablet, Take 1 tablet (25 mg total) by mouth daily., Disp: 90 tablet, Rfl: 3 No Known Allergies   Social History   Socioeconomic History   Marital status: Single    Spouse name: Not on file   Number of children: 3   Years of education: Not on file   Highest education level: 12th grade  Occupational History   Not on  file  Tobacco Use   Smoking status: Some Days    Current packs/day: 1.00    Types: Cigars, Cigarettes   Smokeless tobacco: Never  Vaping Use   Vaping status: Never Used  Substance and Sexual Activity   Alcohol use: Not Currently    Comment: social drinking   Drug use: Not Currently    Types: Marijuana, Cocaine    Comment: 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   Sexual activity: Not Currently    Birth control/protection: Post-menopausal  Other Topics Concern   Not on file  Social History Narrative   Not on file   Social Drivers of Health   Financial Resource  Strain: High Risk (12/14/2021)   Overall Financial Resource Strain (CARDIA)    Difficulty of Paying Living Expenses: Hard  Food Insecurity: No Food Insecurity (12/14/2021)   Hunger Vital Sign    Worried About Running Out of Food in the Last Year: Never true    Ran Out of Food in the Last Year: Never true  Transportation Needs: No Transportation Needs (12/14/2021)   PRAPARE - Administrator, Civil Service (Medical): No    Lack of Transportation (Non-Medical): No  Physical Activity: Inactive (02/21/2017)   Exercise Vital Sign    Days of Exercise per Week: 0 days    Minutes of Exercise per Session: 0 min  Stress: No Stress Concern Present (02/21/2017)   [employer] of Occupational Health - Occupational Stress Questionnaire    Feeling of Stress : Not at all  Social Connections: Somewhat Isolated (02/21/2017)   Social Connection and Isolation Panel [NHANES]    Frequency of Communication with Friends and Family: More than three times a week    Frequency of Social Gatherings with Friends and Family: Once a week    Attends Religious Services: 1 to 4 times per year    Active Member of [employer] or Organizations: No    Attends Banker Meetings: Never    Marital Status: Never married  Intimate Partner Violence: Not At Risk (11/19/2021)   Humiliation, Afraid, Rape, and Kick questionnaire    Fear of Current or Ex-Partner: No    Emotionally Abused: No    Physically Abused: No    Sexually Abused: No    Physical Exam      Future Appointments  Date Time Provider Department Center  03/30/2023  9:00 AM Eugenio Hoes, MD [facility]  05/09/2023  1:30 PM Hoy Register, MD [facility] None

## 2023-03-29 NOTE — Progress Notes (Unsigned)
 [address]       [place] [address]             [phone]           [facility] Medical Record #[id number] Date of Birth: 01/06/1966  Alen Bleacher, NP Hoy Register, MD  Chief Complaint:  Fatigue and AI/AS  History of Present Illness:     Pt is a pleasant 58 yo female who has a complicated cardiac history with previous CAD s/p stenting and cardiomyupathy with EF in the past below 20% with severe AI. Pt has been medically optimized so that most recent echo with EF of 55% with severe AI and moderate AS. She has been feeling fatigued without lower ext edema nor lightheadedness. She was seen in AHF clinic and was felt to require SAVR and cath scheduled for 3/19. She also has history of heroin and cocaine addiction that she reports only 1-2 times a week snorting heroin. She denies IV injections.       Past Medical History:  Diagnosis Date   Chronic systolic heart failure (HCC)    Cocaine abuse (HCC)    Dyslipidemia    Hypertension    Noncompliance w/medication treatment due to intermit use of medication    Stroke [facility]) 2021   no deficits    Past Surgical History:  Procedure Laterality Date   CORONARY PRESSURE/FFR STUDY N/A 10/09/2020   Procedure: INTRAVASCULAR PRESSURE WIRE/FFR STUDY;  Surgeon: Lyn Records, MD;  Location: [facility] INVASIVE CV LAB;  Service: Cardiovascular;  Laterality: N/A;   CORONARY STENT INTERVENTION N/A 10/09/2020   Procedure: CORONARY STENT INTERVENTION;  Surgeon: Lyn Records, MD;  Location: [facility] INVASIVE CV LAB;  Service: Cardiovascular;  Laterality: N/A;   LEFT HEART CATH AND CORONARY ANGIOGRAPHY N/A 10/09/2020   Procedure: LEFT HEART CATH AND CORONARY ANGIOGRAPHY;  Surgeon: Lyn Records, MD;  Location: [facility] INVAS[facility] CV [facility];  Service: Cardiovascular;  Laterality: N/A;   LOOP RECORDER INSERTION N/A 06/27/2018   Procedure: LOOP RECORDER INSERTION;  Surgeon: Regan Lemming, MD;  Location: [facility] INVASIVE CV LAB;  Service:  Cardiovascular;  Laterality: N/A;   RIGHT/LEFT HEART CATH AND CORONARY ANGIOGRAPHY N/A 11/22/2021   Procedure: RIGHT/LEFT HEART CATH AND CORONARY ANGIOGRAPHY;  Surgeon: Laurey Morale, MD;  Location: [facility] INVASIVE CV LAB;  Service: Cardiovascular;  Laterality: N/A;   TEE WITHOUT CARDIOVERSION N/A 11/23/2021   Procedure: TRANSESOPHAGEAL ECHOCARDIOGRAM (TEE);  Surgeon: Laurey Morale, MD;  Location: [facility] ENDOSCOPY;  Service: Cardiovascular;  Laterality: N/A;   TEE WITHOUT CARDIOVERSION N/A 04/19/2022   Procedure: TRANSESOPHAGEAL ECHOCARDIOGRAM (TEE);  Surgeon: Laurey Morale, MD;  Location: [facility] ENDOSCOPY;  Service: Cardiovascular;  Laterality: N/A;    Social History   Tobacco Use  Smoking Status Some Days   Current packs/day: 1.00   Types: Cigars, Cigarettes  Smokeless Tobacco Never    Social History   Substance and Sexual Activity  Alcohol Use Not Currently   Comment: social drinking    Social History   Socioeconomic History   Marital status: Single    Spouse name: Not on file   Number of children: 3   Years of education: Not on file   Highest education level: 12th grade  Occupational History   Not on file  Tobacco Use   Smoking status: Some Days    Current packs/day: 1.00    Types: Cigars, Cigarettes   Smokeless tobacco: Never  Vaping Use   Vaping status:  Never Used  Substance and Sexual Activity   Alcohol use: Not Currently    Comment: social drinking   Drug use: Not Currently    Types: Marijuana, Cocaine    Comment: 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   Sexual activity: Not Currently    Birth control/protection: Post-menopausal  Other Topics Concern   Not on file  Social History Narrative   Not on file   Social Drivers of Health   Financial Resource Strain: High Risk (12/14/2021)   Overall Financial Resource Strain (CARDIA)    Difficulty of Paying Living Expenses: Hard  Food Insecurity: No Food Insecurity (12/14/2021)   Hunger Vital Sign    Worried About  Running Out of Food in the Last Year: Never true    Ran Out of Food in the Last Year: Never true  Transportation Needs: No Transportation Needs (12/14/2021)   PRAPARE - Administrator, Civil Service (Medical): No    Lack of Transportation (Non-Medical): No  Physical Activity: Inactive (02/21/2017)   Exercise Vital Sign    Days of Exercise per Week: 0 days    Minutes of Exercise per Session: 0 min  Stress: No Stress Concern Present (02/21/2017)   [employer] of Occupational Health - Occupational Stress Questionnaire    Feeling of Stress : Not at all  Social Connections: Somewhat Isolated (02/21/2017)   Social Connection and Isolation Panel [NHANES]    Frequency of Communication with Friends and Family: More than three times a week    Frequency of Social Gatherings with Friends and Family: Once a week    Attends Religious Services: 1 to 4 times per year    Active Member of [employer] or Organizations: No    Attends Banker Meetings: Never    Marital Status: Never married  Intimate Partner Violence: Not At Risk (11/19/2021)   Humiliation, Afraid, Rape, and Kick questionnaire    Fear of Current or Ex-Partner: No    Emotionally Abused: No    Physically Abused: No    Sexually Abused: No    No Known Allergies  Current Outpatient Medications  Medication Sig Dispense Refill   atorvastatin (LIPITOR) 40 MG tablet Take 1 tablet (40 mg total) by mouth daily at 6 PM. 90 tablet 3   carvedilol (COREG) 6.25 MG tablet Take 1 tablet (6.25 mg total) by mouth 2 (two) times daily. 180 tablet 3   clopidogrel (PLAVIX) 75 MG tablet Take 1 tablet (75 mg total) by mouth daily. 90 tablet 3   empagliflozin (JARDIANCE) 10 MG TABS tablet Take 1 tablet (10 mg total) by mouth daily. 30 tablet 3   Evolocumab (REPATHA SURECLICK) 140 MG/ML SOAJ Inject 140 mg into the skin every 14 (fourteen) days. 6 mL 3   ezetimibe (ZETIA) 10 MG tablet Take 1 tablet (10 mg total) by mouth daily. 90 tablet 3    sacubitril-valsartan (ENTRESTO) 24-26 MG Take 1 tablet by mouth 2 (two) times daily. 180 tablet 3   spironolactone (ALDACTONE) 25 MG tablet Take 1 tablet (25 mg total) by mouth daily. 90 tablet 3   No current facility-administered medications for this visit.     Family History  Problem Relation Age of Onset   Hypertension Mother    Hypertension Father    Hypertension Brother    Stroke Sister        Physical Exam: LMP  (LMP Unknown) Comment: Pt unsure why she no longer gets her period Lungs: overall clear Card: RR with 3/6 systolic  and 2/6 diastolic murmurs Ext: No edema or vericosities Neuro: intact Edentulous    Diagnostic Studies & Laboratory data: I have personally reviewed the following studies and agree with the findings   TTE (01/2023) IMPRESSIONS     1. Left ventricular ejection fraction, by estimation, is 55 to 60%. The  left ventricle has normal function. The left ventricle has no regional  wall motion abnormalities. Left ventricular diastolic parameters were  normal.   2. Right ventricular systolic function is normal. The right ventricular  size is normal. There is normal pulmonary artery systolic pressure.   3. A small pericardial effusion is present. The pericardial effusion is  localized near the right ventricle.   4. The mitral valve is normal in structure. Trivial mitral valve  regurgitation. No evidence of mitral stenosis.   5. The aortic valve is bicuspid. There is mild calcification of the  aortic valve. Aortic valve regurgitation is moderate to severe. Mild to  moderate aortic valve stenosis. Aortic valve mean gradient measures 11.0  mmHg.   6. The inferior vena cava is normal in size with <50% respiratory  variability, suggesting right atrial pressure of 8 mmHg.   Comparison(s): No significant change from prior study.   FINDINGS   Left Ventricle: Left ventricular ejection fraction, by estimation, is 55  to 60%. The left ventricle has normal  function. The left ventricle has no  regional wall motion abnormalities. The left ventricular internal cavity  size was normal in size. There is   no left ventricular hypertrophy. Left ventricular diastolic parameters  were normal.   Right Ventricle: The right ventricular size is normal. No increase in  right ventricular wall thickness. Right ventricular systolic function is  normal. There is normal pulmonary artery systolic pressure. The tricuspid  regurgitant velocity is 2.22 m/s, and   with an assumed right atrial pressure of 8 mmHg, the estimated right  ventricular systolic pressure is 27.7 mmHg.   Left Atrium: Left atrial size was normal in size.   Right Atrium: Right atrial size was normal in size.   Pericardium: A small pericardial effusion is present. The pericardial  effusion is localized near the right ventricle.   Mitral Valve: The mitral valve is normal in structure. Trivial mitral  valve regurgitation. No evidence of mitral valve stenosis.   Tricuspid Valve: The tricuspid valve is normal in structure. Tricuspid  valve regurgitation is trivial. No evidence of tricuspid stenosis.   Aortic Valve: Fused NCC-RCC. The aortic valve is bicuspid. There is mild  calcification of the aortic valve. Aortic valve regurgitation is moderate  to severe. Aortic regurgitation PHT measures 609 msec. Mild to moderate  aortic stenosis is present. Aortic  valve mean gradient measures 11.0 mmHg. Aortic valve peak gradient  measures 21.9 mmHg. Aortic valve area, by VTI measures 1.07 cm.   Pulmonic Valve: The pulmonic valve was not well visualized. Pulmonic valve  regurgitation is not visualized.   Aorta: The aortic root, ascending aorta, aortic arch and descending aorta  are all structurally normal, with no evidence of dilitation or  obstruction.   Venous: The inferior vena cava is normal in size with less than 50%  respiratory variability, suggesting right atrial pressure of 8 mmHg.    IAS/Shunts: No atrial level shunt detected by color flow Doppler.     LEFT VENTRICLE  PLAX 2D  LVIDd:         4.00 cm   Diastology  LVIDs:         2.80  cm   LV e' medial:    8.27 cm/s  LV PW:         0.80 cm   LV E/e' medial:  6.8  LV IVS:        0.70 cm   LV e' lateral:   8.27 cm/s  LVOT diam:     1.60 cm   LV E/e' lateral: 6.8  LV SV:         37  LV SV Index:   22  LVOT Area:     2.01 cm     RIGHT VENTRICLE             IVC  RV Basal diam:  2.50 cm     IVC diam: 1.10 cm  RV S prime:     13.50 cm/s  TAPSE (M-mode): 1.8 cm   LEFT ATRIUM             Index        RIGHT ATRIUM          Index  LA Vol (A2C):   27.0 ml 15.97 ml/m  RA Area:     9.90 cm  LA Vol (A4C):   37.0 ml 21.89 ml/m  RA Volume:   21.70 ml 12.84 ml/m  LA Biplane Vol: 34.3 ml 20.29 ml/m   AORTIC VALVE  AV Area (Vmax):    0.94 cm  AV Area (Vmean):   0.96 cm  AV Area (VTI):     1.07 cm  AV Vmax:           234.25 cm/s  AV Vmean:          154.000 cm/s  AV VTI:            0.348 m  AV Peak Grad:      21.9 mmHg  AV Mean Grad:      11.0 mmHg  LVOT Vmax:         109.00 cm/s  LVOT Vmean:        73.900 cm/s  LVOT VTI:          0.186 m  LVOT/AV VTI ratio: 0.53  AI PHT:            609 msec  AR Vena Contracta: 0.90 cm    AORTA  Ao Root diam: 2.80 cm  Ao Asc diam:  2.50 cm   MITRAL VALVE               TRICUSPID VALVE  MV Area (PHT): 4.48 cm    TR Peak grad:   19.7 mmHg  MV Decel Time: 170 msec    TR Vmax:        222.00 cm/s  MV E velocity: 56.15 cm/s  MV A velocity: 82.95 cm/s  SHUNTS  MV E/A ratio:  0.68        Systemic VTI:  0.19 m                             Systemic Diam: 1.60 cm    Recent Radiology Findings:       Recent Lab Findings: Lab Results  Component Value Date   WBC 5.7 03/09/2023   HGB 14.2 03/09/2023   HCT 38.5 03/09/2023   PLT 243 03/09/2023   GLUCOSE 100 (H) 03/09/2023   CHOL 136 12/07/2022   TRIG 56 12/07/2022   HDL 51 12/07/2022   LDLCALC 74 12/07/2022   ALT 18  11/19/2021  AST 26 11/19/2021   NA 142 03/09/2023   K 5.0 03/09/2023   CL 106 03/09/2023   CREATININE 0.95 03/09/2023   BUN 11 03/09/2023   CO2 23 03/09/2023   INR 0.9 06/24/2018   HGBA1C 5.6 10/09/2020      Assessment / Plan:     58 yo female with NYHA class 2 symptoms of severe AI/moderate AS with now normal LV function. Awaiting cath to determine extent of CAD. I agree with SAVR and possible CABG while she is symptomatic and also with regained normal LV function. We discussed the options for valve replacement and would best be served with tissue valve secondary to her drug use currently. She understands the need for possible rereplacement in the future. She will need to stop her plavix a week before surgery and is currently planned for 4/11. She will need noncontrast CT of chest. I discussed all the risks, goals, and recovery from surgery and discussed this also with son on her phone. She wishes to proceed.   I have spent 60 min in review of the records, viewing studies and in face to face with patient and in coordination of future care    Eugenio Hoes 03/29/2023 6:41 PM

## 2023-03-30 ENCOUNTER — Encounter: Payer: Self-pay | Admitting: *Deleted

## 2023-03-30 ENCOUNTER — Other Ambulatory Visit: Payer: Self-pay | Admitting: Thoracic Surgery (Cardiothoracic Vascular Surgery)

## 2023-03-30 ENCOUNTER — Encounter: Payer: Self-pay | Admitting: Thoracic Surgery (Cardiothoracic Vascular Surgery)

## 2023-03-30 ENCOUNTER — Other Ambulatory Visit: Payer: Self-pay | Admitting: *Deleted

## 2023-03-30 ENCOUNTER — Institutional Professional Consult (permissible substitution) (INDEPENDENT_AMBULATORY_CARE_PROVIDER_SITE_OTHER): Payer: Medicaid Other | Admitting: Thoracic Surgery (Cardiothoracic Vascular Surgery)

## 2023-03-30 VITALS — BP 121/82 | HR 81 | Resp 18 | Ht 64.0 in | Wt 146.0 lb

## 2023-03-30 DIAGNOSIS — I351 Nonrheumatic aortic (valve) insufficiency: Secondary | ICD-10-CM

## 2023-03-30 NOTE — Patient Instructions (Signed)
 SAVR possible CABG on 4/11 Stop plavix 4/3 Noncontrast CT of chest Keep cath appt

## 2023-04-04 ENCOUNTER — Encounter: Payer: Self-pay | Admitting: Thoracic Surgery (Cardiothoracic Vascular Surgery)

## 2023-04-05 ENCOUNTER — Encounter (HOSPITAL_COMMUNITY)
Admission: RE | Disposition: A | Discharge status: Home / Self Care | Payer: Self-pay | Source: Home / Self Care | Attending: Cardiology

## 2023-04-05 ENCOUNTER — Other Ambulatory Visit: Payer: Self-pay | Admitting: *Deleted

## 2023-04-05 ENCOUNTER — Ambulatory Visit (HOSPITAL_COMMUNITY)
Admission: RE | Admit: 2023-04-05 | Discharge: 2023-04-05 | Disposition: A | Discharge status: Home / Self Care | Payer: Medicaid Other | Attending: Cardiology | Admitting: Cardiology

## 2023-04-05 ENCOUNTER — Telehealth (HOSPITAL_COMMUNITY): Payer: Self-pay | Admitting: Emergency Medicine

## 2023-04-05 ENCOUNTER — Other Ambulatory Visit: Payer: Self-pay

## 2023-04-05 DIAGNOSIS — Z7982 Long term (current) use of aspirin: Secondary | ICD-10-CM | POA: Diagnosis not present

## 2023-04-05 DIAGNOSIS — F17290 Nicotine dependence, other tobacco product, uncomplicated: Secondary | ICD-10-CM | POA: Insufficient documentation

## 2023-04-05 DIAGNOSIS — I350 Nonrheumatic aortic (valve) stenosis: Secondary | ICD-10-CM | POA: Diagnosis not present

## 2023-04-05 DIAGNOSIS — I5022 Chronic systolic (congestive) heart failure: Secondary | ICD-10-CM | POA: Insufficient documentation

## 2023-04-05 DIAGNOSIS — Z8673 Personal history of transient ischemic attack (TIA), and cerebral infarction without residual deficits: Secondary | ICD-10-CM | POA: Insufficient documentation

## 2023-04-05 DIAGNOSIS — I2510 Atherosclerotic heart disease of native coronary artery without angina pectoris: Secondary | ICD-10-CM | POA: Insufficient documentation

## 2023-04-05 DIAGNOSIS — I428 Other cardiomyopathies: Secondary | ICD-10-CM | POA: Insufficient documentation

## 2023-04-05 DIAGNOSIS — Z7902 Long term (current) use of antithrombotics/antiplatelets: Secondary | ICD-10-CM | POA: Insufficient documentation

## 2023-04-05 DIAGNOSIS — Z79899 Other long term (current) drug therapy: Secondary | ICD-10-CM | POA: Insufficient documentation

## 2023-04-05 DIAGNOSIS — E785 Hyperlipidemia, unspecified: Secondary | ICD-10-CM | POA: Diagnosis not present

## 2023-04-05 DIAGNOSIS — I351 Nonrheumatic aortic (valve) insufficiency: Secondary | ICD-10-CM

## 2023-04-05 DIAGNOSIS — F1411 Cocaine abuse, in remission: Secondary | ICD-10-CM | POA: Diagnosis not present

## 2023-04-05 DIAGNOSIS — Z955 Presence of coronary angioplasty implant and graft: Secondary | ICD-10-CM | POA: Insufficient documentation

## 2023-04-05 DIAGNOSIS — I252 Old myocardial infarction: Secondary | ICD-10-CM | POA: Diagnosis not present

## 2023-04-05 DIAGNOSIS — I110 Hypertensive heart disease with heart failure: Secondary | ICD-10-CM | POA: Diagnosis present

## 2023-04-05 HISTORY — PX: RIGHT HEART CATH AND CORONARY ANGIOGRAPHY: CATH118264

## 2023-04-05 LAB — POCT I-STAT EG7
Acid-Base Excess: 1 mmol/L (ref 0.0–2.0)
Acid-Base Excess: 2 mmol/L (ref 0.0–2.0)
Bicarbonate: 25.9 mmol/L (ref 20.0–28.0)
Bicarbonate: 27.3 mmol/L (ref 20.0–28.0)
Calcium, Ion: 1.13 mmol/L — ABNORMAL LOW (ref 1.15–1.40)
Calcium, Ion: 1.2 mmol/L (ref 1.15–1.40)
HCT: 36 % (ref 36.0–46.0)
HCT: 37 % (ref 36.0–46.0)
Hemoglobin: 12.2 g/dL (ref 12.0–15.0)
Hemoglobin: 12.6 g/dL (ref 12.0–15.0)
O2 Saturation: 74 %
O2 Saturation: 74 %
Potassium: 3.8 mmol/L (ref 3.5–5.1)
Potassium: 3.9 mmol/L (ref 3.5–5.1)
Sodium: 139 mmol/L (ref 135–145)
Sodium: 139 mmol/L (ref 135–145)
TCO2: 27 mmol/L (ref 22–32)
TCO2: 29 mmol/L (ref 22–32)
pCO2, Ven: 43.6 mmHg — ABNORMAL LOW (ref 44–60)
pCO2, Ven: 45.5 mmHg (ref 44–60)
pH, Ven: 7.382 (ref 7.25–7.43)
pH, Ven: 7.386 (ref 7.25–7.43)
pO2, Ven: 40 mmHg (ref 32–45)
pO2, Ven: 40 mmHg (ref 32–45)

## 2023-04-05 SURGERY — RIGHT HEART CATH AND CORONARY ANGIOGRAPHY
Anesthesia: LOCAL

## 2023-04-05 MED ORDER — ASPIRIN 81 MG PO CHEW
81.0000 mg | CHEWABLE_TABLET | Freq: Once | ORAL | Status: AC
  Administered 2023-04-05: 81 mg via ORAL
  Filled 2023-04-05: qty 1

## 2023-04-05 MED ORDER — MIDAZOLAM HCL 2 MG/2ML IJ SOLN
INTRAMUSCULAR | Status: DC | PRN
Start: 2023-04-05 — End: 2023-04-05
  Administered 2023-04-05 (×2): 1 mg via INTRAVENOUS

## 2023-04-05 MED ORDER — HEPARIN SODIUM (PORCINE) 1000 UNIT/ML IJ SOLN
INTRAMUSCULAR | Status: DC | PRN
  Administered 2023-04-05: 3000 [IU] via INTRAVENOUS

## 2023-04-05 MED ORDER — LIDOCAINE HCL (PF) 1 % IJ SOLN
INTRAMUSCULAR | Status: AC
  Filled 2023-04-05: qty 30

## 2023-04-05 MED ORDER — LABETALOL HCL 5 MG/ML IV SOLN: 10.0000 mg | INTRAVENOUS | Status: DC | PRN

## 2023-04-05 MED ORDER — HEPARIN (PORCINE) IN NACL 1000-0.9 UT/500ML-% IV SOLN
INTRAVENOUS | Status: DC | PRN
  Administered 2023-04-05 (×2): 500 mL

## 2023-04-05 MED ORDER — VERAPAMIL HCL 2.5 MG/ML IV SOLN
INTRAVENOUS | Status: AC
Start: 2023-04-05 — End: ?
  Filled 2023-04-05: qty 2

## 2023-04-05 MED ORDER — ACETAMINOPHEN 325 MG PO TABS: 650.0000 mg | ORAL_TABLET | ORAL | Status: DC | PRN

## 2023-04-05 MED ORDER — IOHEXOL 350 MG/ML SOLN
INTRAVENOUS | Status: DC | PRN
  Administered 2023-04-05: 75 mL via INTRA_ARTERIAL

## 2023-04-05 MED ORDER — CLOPIDOGREL BISULFATE 75 MG PO TABS
75.0000 mg | ORAL_TABLET | Freq: Once | ORAL | Status: AC
  Administered 2023-04-05: 75 mg via ORAL
  Filled 2023-04-05: qty 1

## 2023-04-05 MED ORDER — FENTANYL CITRATE (PF) 100 MCG/2ML IJ SOLN
INTRAMUSCULAR | Status: AC
  Filled 2023-04-05: qty 2

## 2023-04-05 MED ORDER — SODIUM CHLORIDE 0.9% FLUSH: 3.0000 mL | INTRAVENOUS | Status: DC | PRN

## 2023-04-05 MED ORDER — SODIUM CHLORIDE 0.9 % IV SOLN: 250.0000 mL | INTRAVENOUS | Status: DC | PRN

## 2023-04-05 MED ORDER — ONDANSETRON HCL 4 MG/2ML IJ SOLN: 4.0000 mg | Freq: Four times a day (QID) | INTRAMUSCULAR | Status: DC | PRN

## 2023-04-05 MED ORDER — HYDRALAZINE HCL 20 MG/ML IJ SOLN: 10.0000 mg | INTRAMUSCULAR | Status: DC | PRN

## 2023-04-05 MED ORDER — SODIUM CHLORIDE 0.9 % IV SOLN: INTRAVENOUS | Status: DC

## 2023-04-05 MED ORDER — HEPARIN SODIUM (PORCINE) 1000 UNIT/ML IJ SOLN
INTRAMUSCULAR | Status: AC
  Filled 2023-04-05: qty 10

## 2023-04-05 MED ORDER — LIDOCAINE HCL (PF) 1 % IJ SOLN
INTRAMUSCULAR | Status: DC | PRN
  Administered 2023-04-05: 5 mL
  Administered 2023-04-05: 2 mL

## 2023-04-05 MED ORDER — FENTANYL CITRATE (PF) 100 MCG/2ML IJ SOLN
INTRAMUSCULAR | Status: DC | PRN
  Administered 2023-04-05 (×2): 25 ug via INTRAVENOUS

## 2023-04-05 MED ORDER — SODIUM CHLORIDE 0.9% FLUSH: 3.0000 mL | Freq: Two times a day (BID) | INTRAVENOUS | Status: DC

## 2023-04-05 MED ORDER — VERAPAMIL HCL 2.5 MG/ML IV SOLN
INTRAVENOUS | Status: DC | PRN
  Administered 2023-04-05: 10 mL via INTRA_ARTERIAL

## 2023-04-05 MED ORDER — MIDAZOLAM HCL 2 MG/2ML IJ SOLN
INTRAMUSCULAR | Status: AC
  Filled 2023-04-05: qty 2

## 2023-04-05 SURGICAL SUPPLY — 10 items
CATH 5FR JL3.5 JR4 ANG PIG MP (CATHETERS) IMPLANT
CATH BALLN WEDGE 5F 110CM (CATHETERS) IMPLANT
DEVICE RAD COMP TR BAND LRG (VASCULAR PRODUCTS) IMPLANT
GLIDESHEATH SLEND SS 6F .021 (SHEATH) IMPLANT
GUIDEWIRE INQWIRE 1.5J.035X260 (WIRE) IMPLANT
INQWIRE 1.5J .035X260CM (WIRE) ×1 IMPLANT
KIT HEART LEFT (KITS) IMPLANT
PACK CARDIAC CATHETERIZATION (CUSTOM PROCEDURE TRAY) ×2 IMPLANT
SHEATH GLIDE SLENDER 4/5FR (SHEATH) IMPLANT
TUBING CIL FLEX 10 FLL-RA (TUBING) IMPLANT

## 2023-04-05 NOTE — Telephone Encounter (Signed)
 Called and LVM to let pt know next home visit will be 3/26 @ 10:30.    Beatrix Shipper, EMT-Paramedic [phone] 04/05/2023

## 2023-04-05 NOTE — Interval H&P Note (Signed)
 History and Physical Interval Note:  04/05/2023 11:01 AM  Leslie Walker  has presented today for surgery, with the diagnosis of Heart Failure.  The various methods of treatment have been discussed with the patient and family. After consideration of risks, benefits and other options for treatment, the patient has consented to  Procedure(s): RIGHT/LEFT HEART CATH AND CORONARY ANGIOGRAPHY (N/A) as a surgical intervention.  The patient's history has been reviewed, patient examined, no change in status, stable for surgery.  I have reviewed the patient's chart and labs.  Questions were answered to the patient's satisfaction.     Sahaana Weitman [employer]

## 2023-04-05 NOTE — Progress Notes (Signed)
 Per Dr. Shirlee Latch  patient can resume plavix today.

## 2023-04-06 ENCOUNTER — Encounter (HOSPITAL_COMMUNITY): Payer: Self-pay | Admitting: Cardiology

## 2023-04-12 ENCOUNTER — Telehealth (HOSPITAL_COMMUNITY): Payer: Self-pay | Admitting: Emergency Medicine

## 2023-04-12 NOTE — Telephone Encounter (Signed)
 Ms. Tatem text this morning to move today's appointment to the afternoon and then text back to change it to tomorrow 3/27.    I will see her in the morning around 10:30.    Beatrix Shipper, EMT-Paramedic [phone] 04/12/2023

## 2023-04-19 ENCOUNTER — Other Ambulatory Visit (HOSPITAL_COMMUNITY): Payer: Self-pay | Admitting: Emergency Medicine

## 2023-04-19 NOTE — Progress Notes (Signed)
 Paramedicine Encounter    Patient ID: Leslie Walker, female    DOB: [birth year], 58 y.o.   MRN: [id number]   Complaints NONE  Assessment A&O x 4, skin W&D w/ good color.  Denies chest pain or SOB.  Lung sounds clear throughout.  No peripheral edema   Compliance with meds YES  Pill box filled x 1 week  Refills needed NONE  Meds changes since last visit Will discontinue Plavix 4/4, and d/c Jardiance and Entresto 4/8 due to upcoming Aortic Valve Replacement scheduled for 4/11.    Social changes NONE   BP 110/70 (BP Location: Right Arm, Patient Position: Sitting, Cuff Size: Normal)   Pulse 78   Resp 16   Wt 143 lb 4.8 oz (65 kg)   LMP  (LMP Unknown) Comment: Pt unsure why she no longer gets her period  SpO2 97%   BMI 24.60 kg/m  Weight yesterday- Not taken  Last visit weight- 143lb  ATF [name] busy cleaning her house.  She reports that she is doing well.  She is consistently very conscientious with her med compliance and making good food choices.  She is preparing for her upcoming surgery for an aortic valve replacement.   Med box reconciled to reflect med changes needed prior to her surgery on 04/28/23.  Reviewed the changes several times w/ and her and she advises she understands same.   I will follow up with her post op.  ACTION: Home visit completed  [name] [phone] 04/19/23  Patient Care Team: Hoy Register, MD as PCP - General (Family Medicine) Chrystie Nose, MD as PCP - Cardiology (Cardiology)  Patient Active Problem List   Diagnosis Date Noted   Nonrheumatic aortic valve insufficiency 04/19/2022   Coronary artery disease 11/20/2021   Elevated troponin 11/19/2021   Heart failure (HCC) 11/19/2021   Acute on chronic systolic heart failure (HCC) 11/18/2021   Chronic systolic heart failure (HCC)    Chest pain, precordial 10/09/2020   Cocaine use 10/09/2020   NSTEMI (non-ST elevated myocardial infarction) (HCC) 10/09/2020    Polysubstance abuse (HCC) 10/09/2020   Hyperlipidemia 10/09/2020   Lung nodule 10/09/2020   Tobacco dependence 10/09/2020   Screening breast examination 09/04/2018   Heroin dependence (HCC) 06/29/2018   TIA (transient ischemic attack) 06/24/2018   Nail fungus 04/20/2015   Porokeratosis 04/20/2015   History of ulceration 04/20/2015   Foot ulcer (HCC) 02/17/2014   Cellulitis of foot, right 02/17/2014   Chronic pain syndrome 02/17/2014   Essential hypertension 02/17/2014    Current Outpatient Medications:    atorvastatin (LIPITOR) 40 MG tablet, Take 1 tablet (40 mg total) by mouth daily at 6 PM., Disp: 90 tablet, Rfl: 3   carvedilol (COREG) 6.25 MG tablet, Take 1 tablet (6.25 mg total) by mouth 2 (two) times daily., Disp: 180 tablet, Rfl: 3   clopidogrel (PLAVIX) 75 MG tablet, Take 1 tablet (75 mg total) by mouth daily., Disp: 90 tablet, Rfl: 3   empagliflozin (JARDIANCE) 10 MG TABS tablet, Take 1 tablet (10 mg total) by mouth daily., Disp: 30 tablet, Rfl: 3   Evolocumab (REPATHA SURECLICK) 140 MG/ML SOAJ, Inject 140 mg into the skin every 14 (fourteen) days., Disp: 6 mL, Rfl: 3   ezetimibe (ZETIA) 10 MG tablet, Take 1 tablet (10 mg total) by mouth daily., Disp: 90 tablet, Rfl: 3   sacubitril-valsartan (ENTRESTO) 24-26 MG, Take 1 tablet by mouth 2 (two) times daily., Disp: 180 tablet, Rfl: 3   spironolactone (ALDACTONE) 25 MG  tablet, Take 1 tablet (25 mg total) by mouth daily., Disp: 90 tablet, Rfl: 3 No Known Allergies   Social History   Socioeconomic History   Marital status: Single    Spouse name: Not on file   Number of children: 3   Years of education: Not on file   Highest education level: 12th grade  Occupational History   Not on file  Tobacco Use   Smoking status: Some Days    Current packs/day: 1.00    Types: Cigars, Cigarettes   Smokeless tobacco: Never  Vaping Use   Vaping status: Never Used  Substance and Sexual Activity   Alcohol use: Not Currently    Comment:  social drinking   Drug use: Not Currently    Types: Marijuana, Cocaine    Comment: 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   Sexual activity: Not Currently    Birth control/protection: Post-menopausal  Other Topics Concern   Not on file  Social History Narrative   Not on file   Social Drivers of Health   Financial Resource Strain: High Risk (12/14/2021)   Overall Financial Resource Strain (CARDIA)    Difficulty of Paying Living Expenses: Hard  Food Insecurity: No Food Insecurity (12/14/2021)   Hunger Vital Sign    Worried About Running Out of Food in the Last Year: Never true    Ran Out of Food in the Last Year: Never true  Transportation Needs: No Transportation Needs (12/14/2021)   PRAPARE - Administrator, Civil Service (Medical): No    Lack of Transportation (Non-Medical): No  Physical Activity: Inactive (02/21/2017)   Exercise Vital Sign    Days of Exercise per Week: 0 days    Minutes of Exercise per Session: 0 min  Stress: No Stress Concern Present (02/21/2017)   [employer] of Occupational Health - Occupational Stress Questionnaire    Feeling of Stress : Not at all  Social Connections: Somewhat Isolated (02/21/2017)   Social Connection and Isolation Panel [NHANES]    Frequency of Communication with Friends and Family: More than three times a week    Frequency of Social Gatherings with Friends and Family: Once a week    Attends Religious Services: 1 to 4 times per year    Active Member of [employer] or Organizations: No    Attends Banker Meetings: Never    Marital Status: Never married  Intimate Partner Violence: Not At Risk (11/19/2021)   Humiliation, Afraid, Rape, and Kick questionnaire    Fear of Current or Ex-Partner: No    Emotionally Abused: No    Physically Abused: No    Sexually Abused: No    Physical Exam      Future Appointments  Date Time Provider Department Center  04/21/2023  4:30 PM [facility] 1 [facility]  04/26/2023  10:00 AM [facility] US 1 - OUTPATIENT ONLY [facility]  04/26/2023 11:00 AM [facility] PAT 1 [facility] None  05/09/2023  1:30 PM Hoy Register, MD [facility] None

## 2023-04-20 IMAGING — CT CT ANGIO CHEST-ABD-PELV FOR DISSECTION W/ AND WO/W CM
2 of 7 series · 14 of 46 positions shown, 16 images · non-contrast
Comparison: CT AP 06/29/2018.

CLINICAL DATA: Rule out aortic dissection. Abdominal pain.
Intermittent right chest pain.

EXAM:
CT ANGIOGRAPHY CHEST, ABDOMEN AND PELVIS
TECHNIQUE: Non-contrast CT of the chest was initially obtained.

[Series 5: dissection 3.0 i30f 3 · axial · 0.77mm/px · z∈[+690,+1227]mm · 11 of 203 slices shown, 13 images]
[im 12/203  soft-tissue]
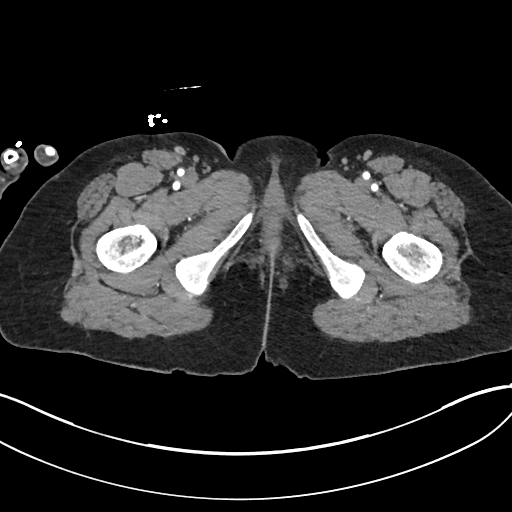
[im 12/203  bone]
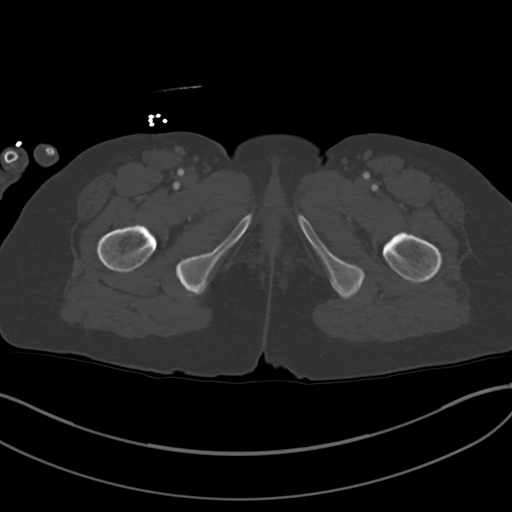
[im 36/203  soft-tissue]
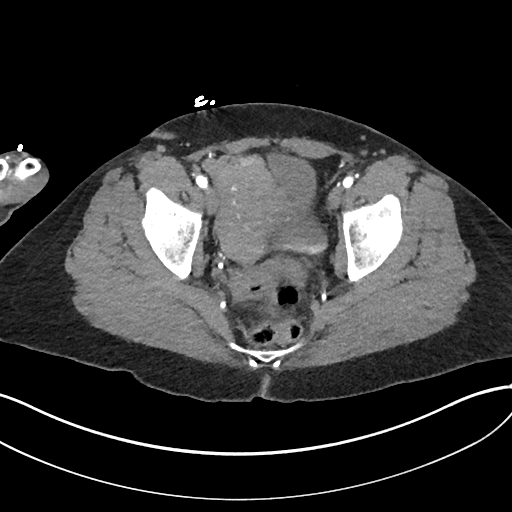
[im 48/203  soft-tissue]
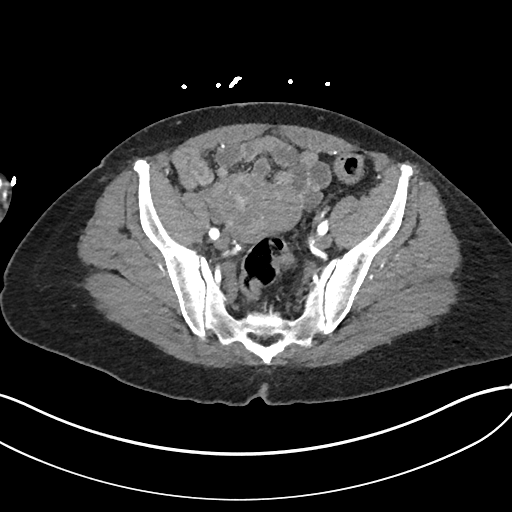
[im 72/203  soft-tissue]
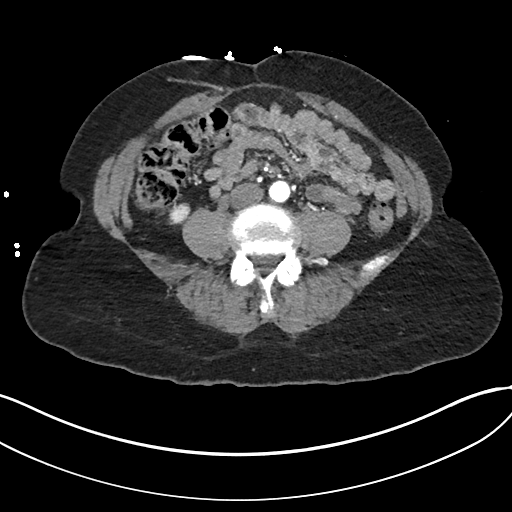
[im 84/203  soft-tissue]
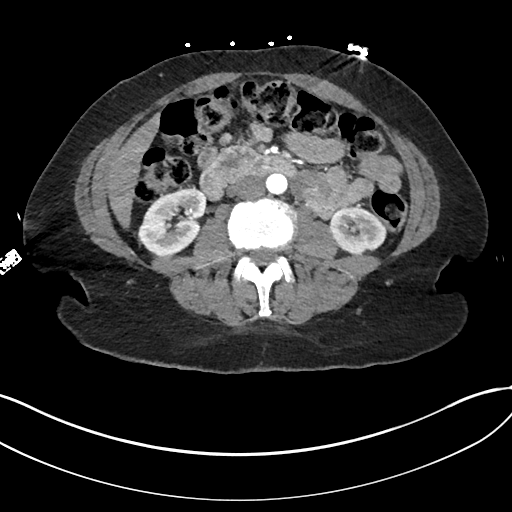
[im 107/203  soft-tissue]
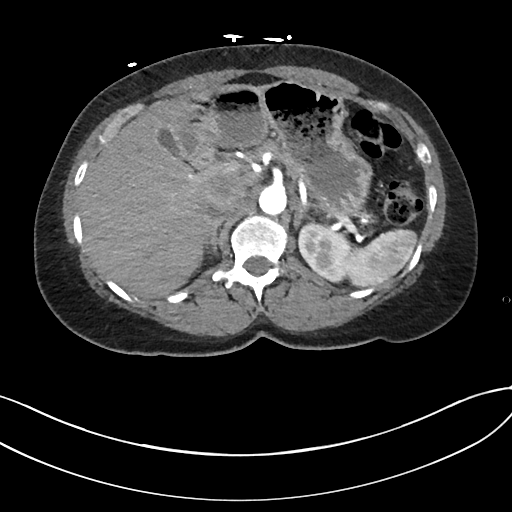
[im 119/203  soft-tissue]
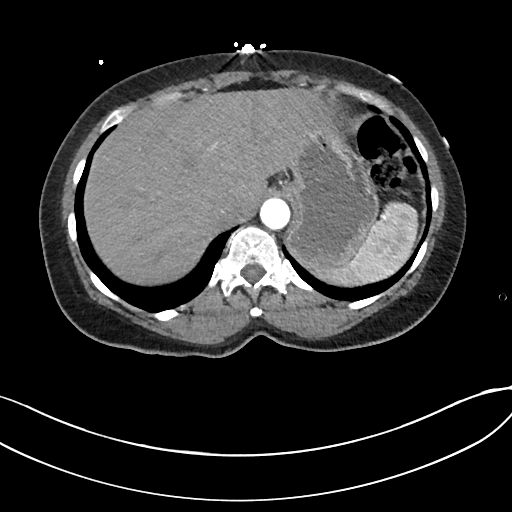
[im 131/203  soft-tissue]
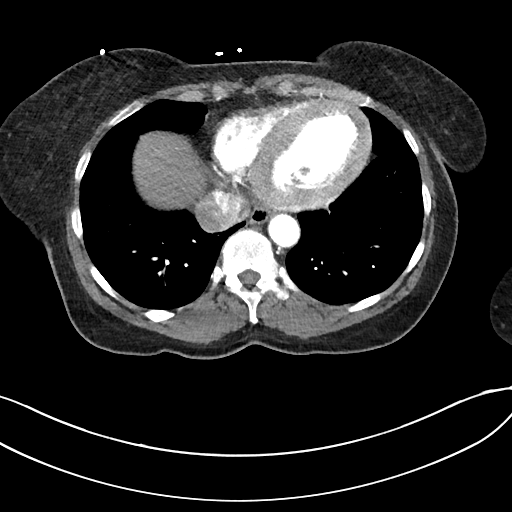
[im 155/203  soft-tissue]
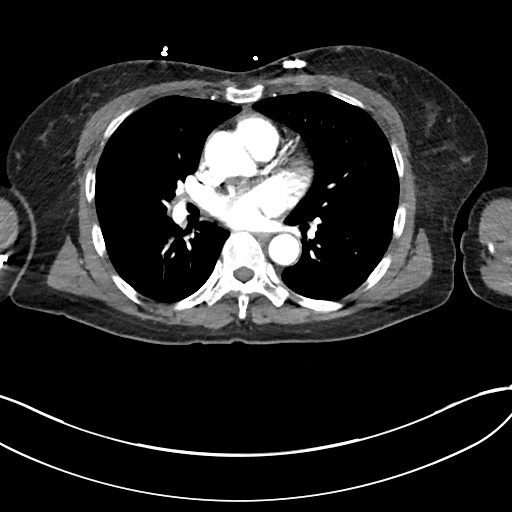
[im 155/203  bone]
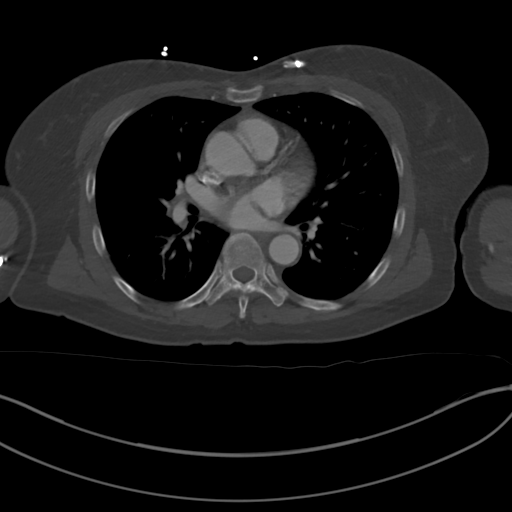
[im 167/203  soft-tissue]
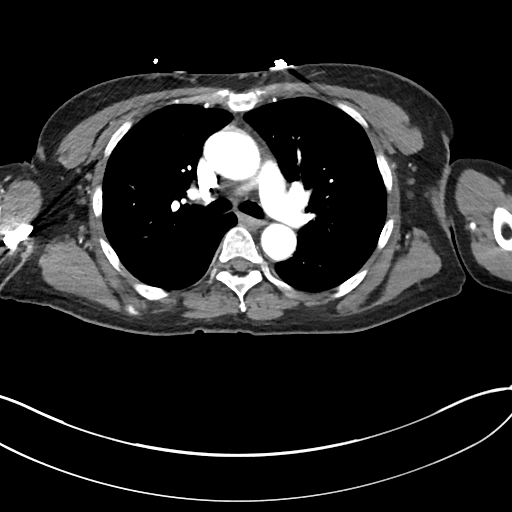
[im 191/203  soft-tissue]
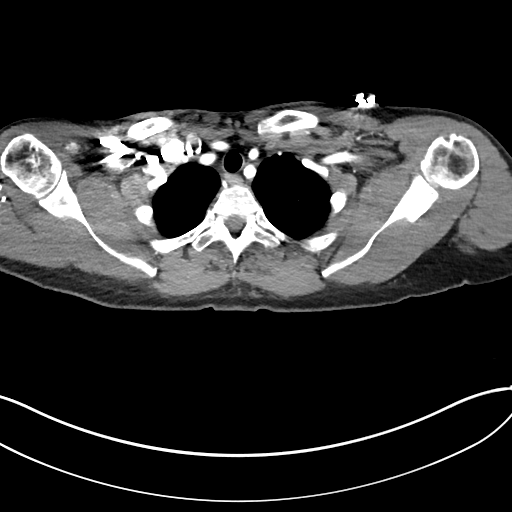

[Series 9: coronals · coronal · 0.81mm/px · 3 of 137 slices shown]
[im 35/137  soft-tissue]
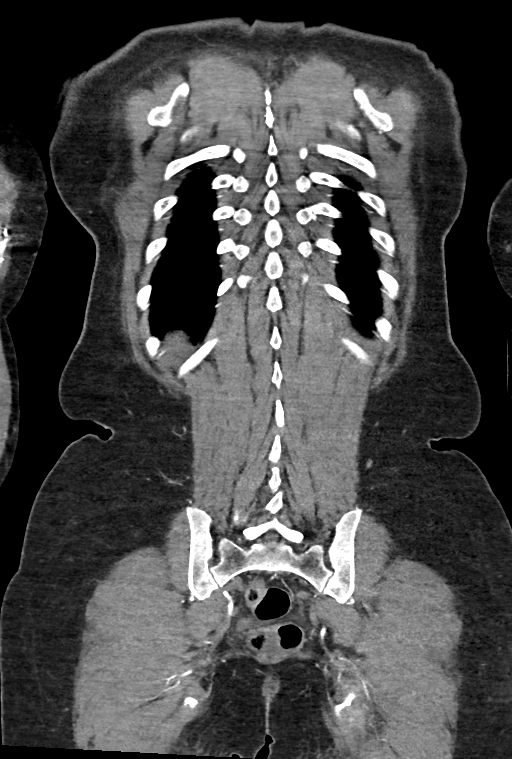
[im 69/137  soft-tissue]
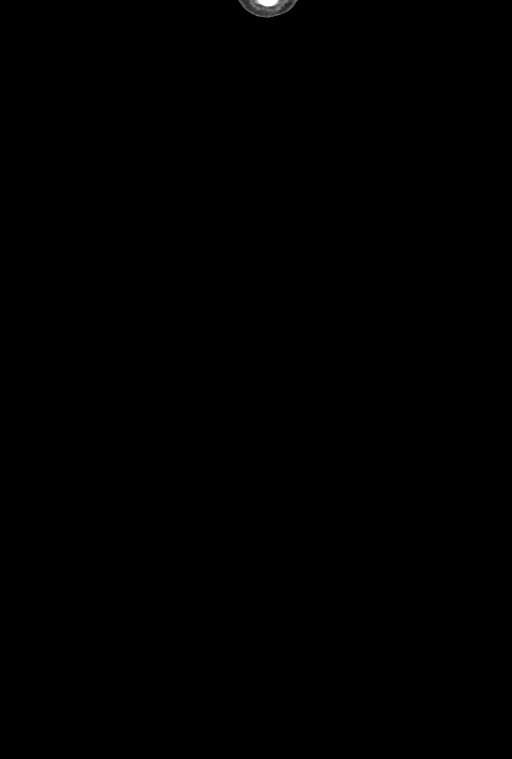
[im 103/137  soft-tissue]
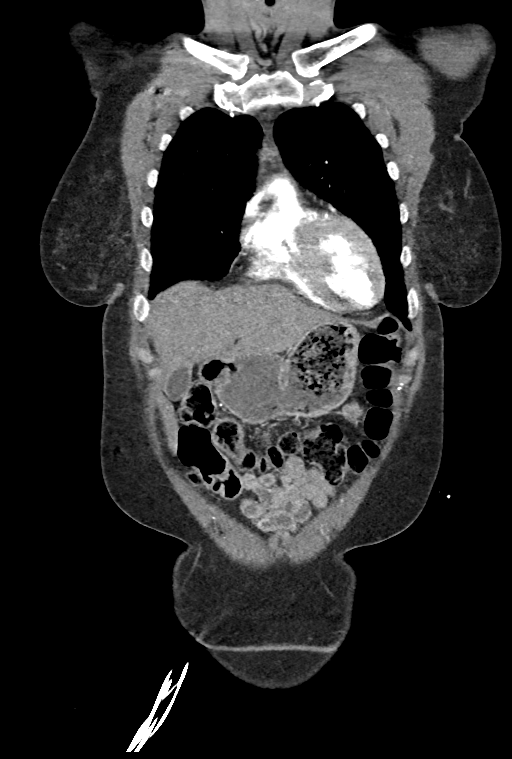

[14 of 46 positions shown; findings below may reference images not displayed]

Multidetector CT imaging through the chest, abdomen and pelvis was
performed using the standard protocol during bolus administration of
intravenous contrast. Multiplanar reconstructed images and MIPs were
obtained and reviewed to evaluate the vascular anatomy.

CONTRAST:  100mL OMNIPAQUE IOHEXOL 350 MG/ML SOLN
FINDINGS: CTA CHEST FINDINGS

Cardiovascular: Preferential opacification of the thoracic aorta. No
evidence of thoracic aortic aneurysm or dissection. Normal heart
size. No pericardial effusion. Aortic atherosclerosis. Coronary
artery calcifications. No signs of central obstructing pulmonary
embolus.

Mediastinum/Nodes: No enlarged mediastinal, hilar, or axillary lymph
nodes. Thyroid gland, trachea, and esophagus demonstrate no
significant findings.

Lungs/Pleura: No pleural effusion, airspace consolidation,
atelectasis or pneumothorax. Subpleural nodule within the periphery
of the right upper lobe measures 3 mm, image 34/8.

Musculoskeletal: No chest wall abnormality. No acute or significant
osseous findings.

Review of the MIP images confirms the above findings.

CTA ABDOMEN AND PELVIS FINDINGS

VASCULAR

Aorta: Normal caliber aorta without aneurysm, dissection, vasculitis
or significant stenosis.

Celiac: Patent without evidence of aneurysm, dissection, vasculitis
or significant stenosis.

SMA: Patent without evidence of aneurysm, dissection, vasculitis or
significant stenosis.

Renals: Normal appearance of the left renal artery. Calcified and
noncalcified plaque at the origin of the right renal artery is
identified with approximately 70% stenosis.

IMA: Patent without evidence of aneurysm, dissection, vasculitis or
significant stenosis.

Inflow: Patent without evidence of aneurysm, dissection, vasculitis
or significant stenosis.

Veins: No obvious venous abnormality within the limitations of this
arterial phase study.

Review of the MIP images confirms the above findings.

NON-VASCULAR

Hepatobiliary: No focal liver abnormality is seen. No gallstones,
gallbladder wall thickening, or biliary dilatation.

Pancreas: Unremarkable. No pancreatic ductal dilatation or
surrounding inflammatory changes.

Spleen: Normal in size without focal abnormality.

Adrenals/Urinary Tract: Normal adrenal glands. Upper pole right
kidney cyst measures 3.2 cm. No mass or hydronephrosis. Bladder
unremarkable.

Stomach/Bowel: Stomach is within normal limits. Appendix appears
normal. No evidence of bowel wall thickening, distention, or
inflammatory changes.

Lymphatic: No enlarged lymph nodes

Reproductive: Enlarged fibroid uterus.

Other: No abdominal wall hernia or abnormality. No abdominopelvic
ascites.

Musculoskeletal: No acute or significant osseous findings.

Review of the MIP images confirms the above findings.
IMPRESSION: 1. No evidence for aortic dissection.
2. Coronary artery calcifications noted.
3. Calcified and noncalcified plaque at the origin of the right
renal artery is identified with approximately 70% stenosis.
4. Subpleural nodule in the right upper lobe measures 3 mm.
Nonspecific. No follow-up needed if patient is low-risk.
Non-contrast chest CT can be considered in 12 months if patient is
high-risk. This recommendation follows the consensus statement:
Guidelines for Management of Incidental Pulmonary Nodules Detected
5. Enlarged fibroid uterus.
6. Aortic Atherosclerosis ([id number]).

## 2023-04-20 IMAGING — DX DG CHEST 2V
2 series · 2 of 2 positions shown · non-contrast
Comparison: None.

CLINICAL DATA: Right-sided chest pain for 2 days

EXAM:
CHEST - 2 VIEW

[w chest pa]
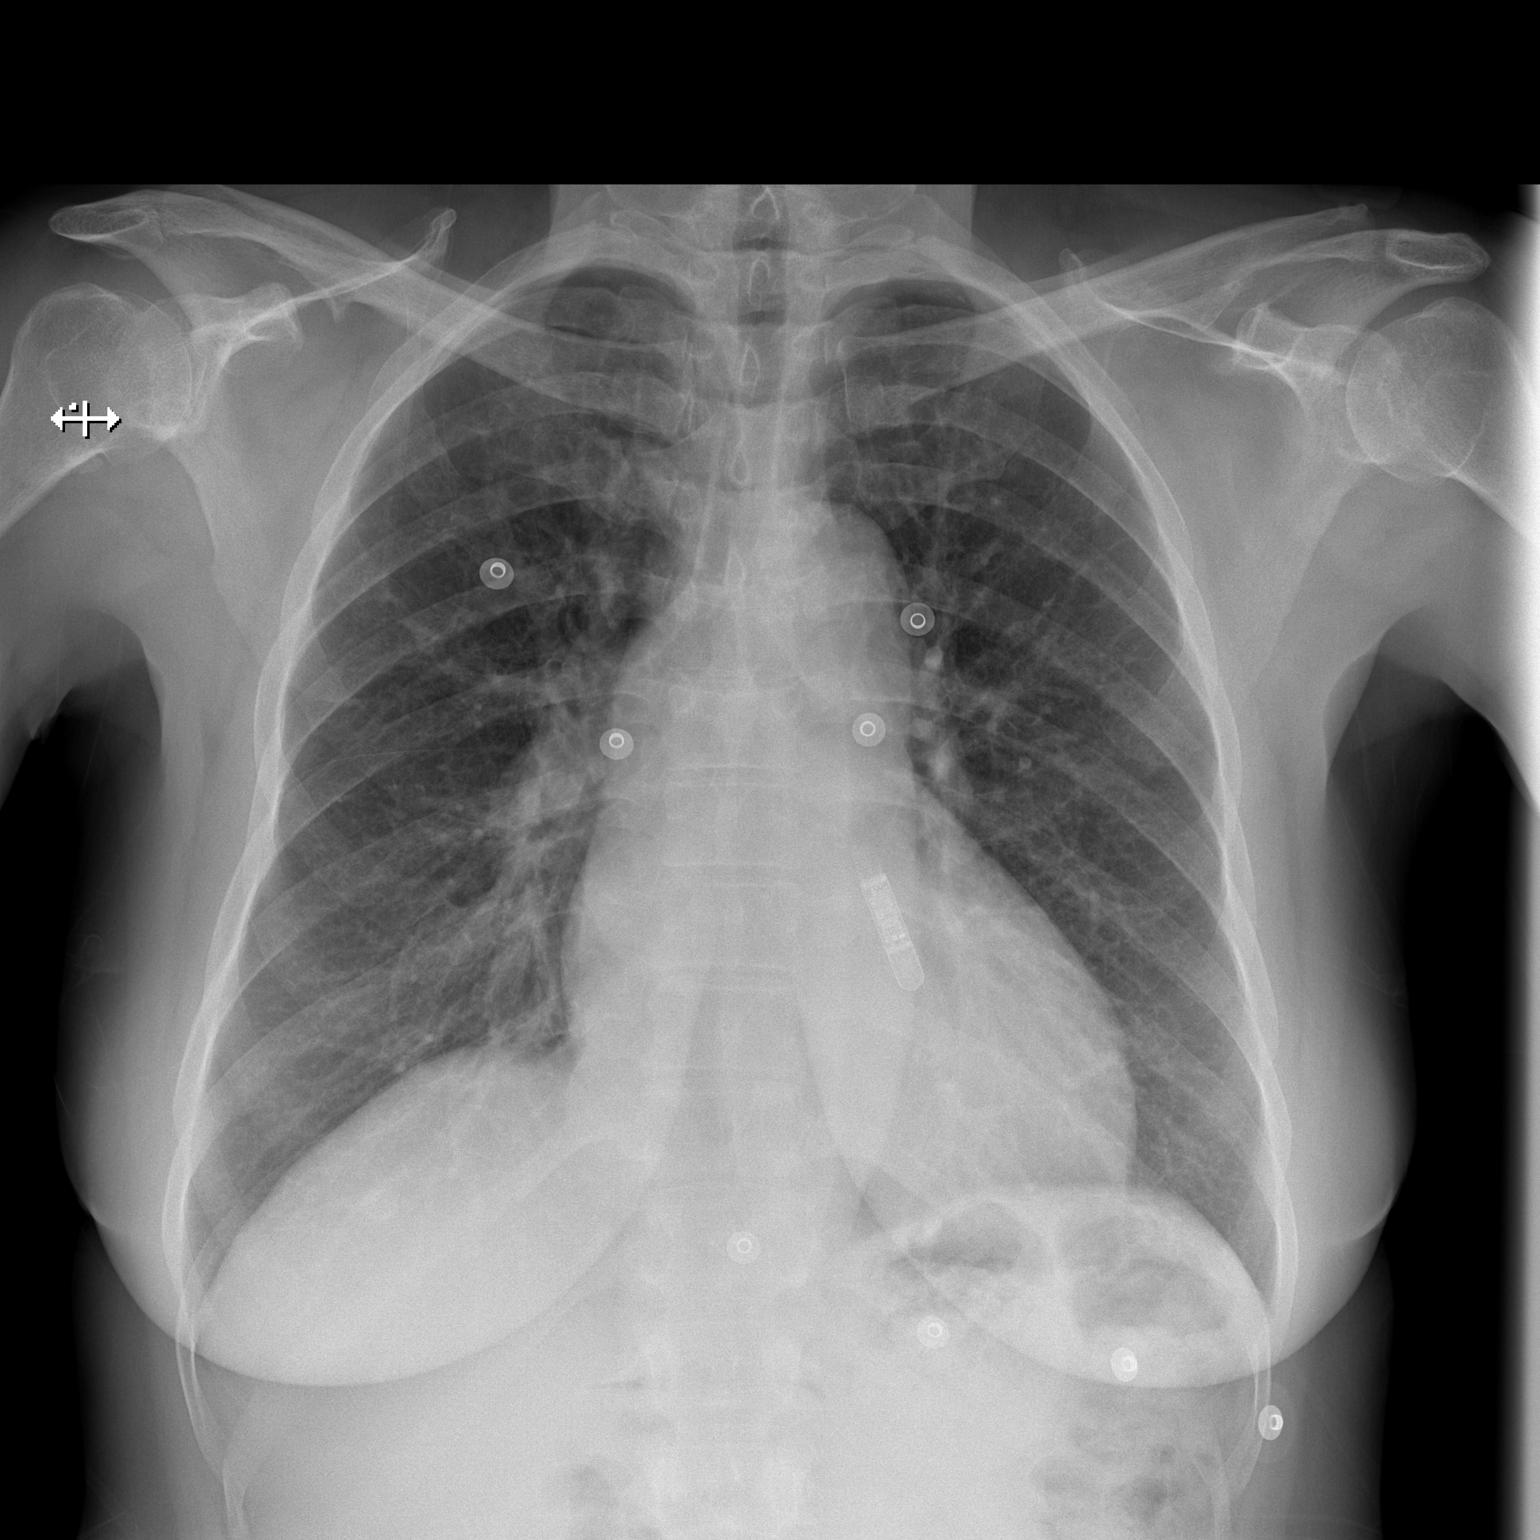

[w chest lat]
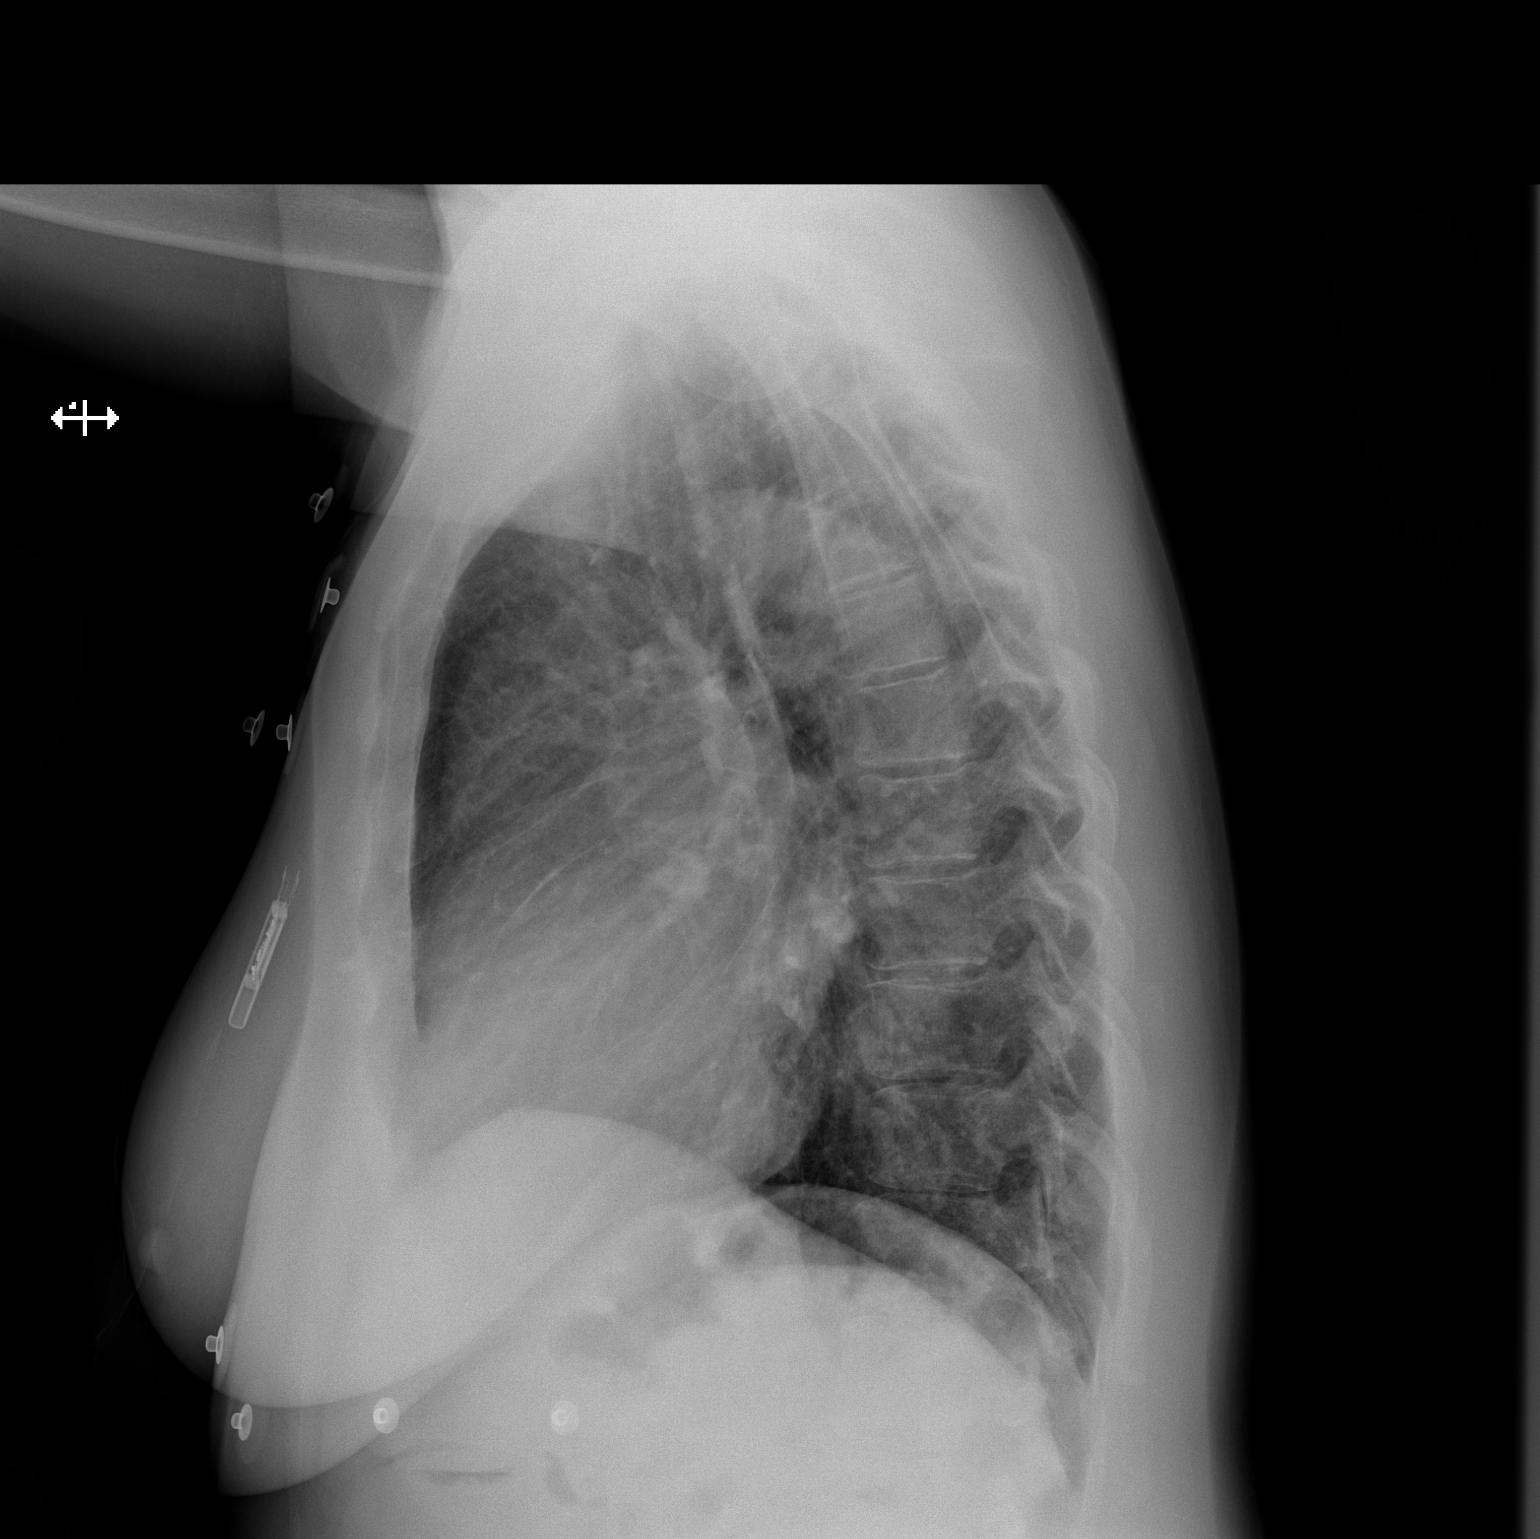

[2 of 2 positions shown; findings below may reference images not displayed]

FINDINGS: Normal heart size and pulmonary vascularity. No focal airspace
disease or consolidation in the lungs. No blunting of costophrenic
angles. No pneumothorax. Mediastinal contours appear intact. Loop
recorder.
IMPRESSION: No active cardiopulmonary disease.

## 2023-04-21 ENCOUNTER — Telehealth: Payer: Self-pay

## 2023-04-21 ENCOUNTER — Ambulatory Visit
Admission: RE | Admit: 2023-04-21 | Discharge: 2023-04-21 | Disposition: A | Discharge status: Home / Self Care | Source: Ambulatory Visit | Attending: Thoracic Surgery (Cardiothoracic Vascular Surgery) | Admitting: Thoracic Surgery (Cardiothoracic Vascular Surgery)

## 2023-04-21 DIAGNOSIS — I351 Nonrheumatic aortic (valve) insufficiency: Secondary | ICD-10-CM

## 2023-04-21 NOTE — Telephone Encounter (Signed)
 FMLA form completed and faxed to [employer] @ [phone]./Beginning LOA 04/28/23 through 07/24/23./ DOS 04/28/23/ forms mailed to pt's home address.

## 2023-04-25 ENCOUNTER — Encounter (HOSPITAL_COMMUNITY): Payer: Self-pay | Admitting: Thoracic Surgery (Cardiothoracic Vascular Surgery)

## 2023-04-25 NOTE — Pre-Procedure Instructions (Signed)
 Surgical Instructions    Your procedure is scheduled on Friday, April 11th, 2025.   Report to [facility] Main Entrance "A" at 0530 A.M., then check in with the Admitting office.  Call this number if you have problems the morning of surgery:  [phone]  If you have any questions prior to your surgery date call [phone]: Open Monday-Friday 8am-4pm If you experience any cold or flu symptoms such as cough, fever, chills, shortness of breath, etc. between now and your scheduled surgery, please notify us at the above number.     Remember:  Do not eat or drink after midnight the night before your surgery   Take these medicines the morning of surgery with A SIP OF WATER:             carvedilol (COREG)              ezetimibe (ZETIA)            Hold clopidogrel (PLAVIX) for a week before surgery. Last dose- 04/20/2023           Hold empagliflozin (JARDIANCE) 72 hours prior to your surgery. Last dose - 04/24/2022  As of today, STOP taking any Aspirin (unless otherwise instructed by your surgeon) Aleve, Naproxen, Ibuprofen, Motrin, Advil, Goody's, BC's, all herbal medications, fish oil, and all vitamins.                     Do NOT Smoke (Tobacco/Vaping) for 24 hours prior to your procedure.  If you use a CPAP at night, you may bring your mask/headgear for your overnight stay.   Contacts, glasses, piercing's, hearing aid's, dentures or partials may not be worn into surgery, please bring cases for these belongings.    For patients admitted to the hospital, discharge time will be determined by your treatment team.   Patients discharged the day of surgery will not be allowed to drive home, and someone needs to stay with them for 24 hours.  SURGICAL WAITING ROOM VISITATION Patients having surgery or a procedure may have no more than 2 support people in the waiting area - these visitors may rotate.   Children under the age of 69 must have an adult with them who is not the patient. If the  patient needs to stay at the hospital during part of their recovery, the visitor guidelines for inpatient rooms apply. Pre-op nurse will coordinate an appropriate time for 1 support person to accompany patient in pre-op.  This support person may not rotate.   Please refer to the [facility] website for the visitor guidelines for Inpatients (after your surgery is over and you are in a regular room).    Special instructions:   [facility]- Preparing For Surgery  Before surgery, you can play an important role. Because skin is not sterile, your skin needs to be as free of germs as possible. You can reduce the number of germs on your skin by washing with CHG (chlorahexidine gluconate) Soap before surgery.  CHG is an antiseptic cleaner which kills germs and bonds with the skin to continue killing germs even after washing.    Oral Hygiene is also important to reduce your risk of infection.  Remember - BRUSH YOUR TEETH THE MORNING OF SURGERY WITH YOUR REGULAR TOOTHPASTE  Please do not use if you have an allergy to CHG or antibacterial soaps. If your skin becomes reddened/irritated stop using the CHG.  Do not shave (including legs and underarms) for at least 48  hours prior to first CHG shower. It is OK to shave your face.  Please follow these instructions carefully.   Shower the NIGHT BEFORE SURGERY and the MORNING OF SURGERY  If you chose to wash your hair, wash your hair first as usual with your normal shampoo.  After you shampoo, rinse your hair and body thoroughly to remove the shampoo.  Use CHG Soap as you would any other liquid soap. You can apply CHG directly to the skin and wash gently with a scrungie or a clean washcloth.   Apply the CHG Soap to your body ONLY FROM THE NECK DOWN.  Do not use on open wounds or open sores. Avoid contact with your eyes, ears, mouth and genitals (private parts). Wash Face and genitals (private parts)  with your normal soap.   Wash thoroughly, paying special  attention to the area where your surgery will be performed.  Thoroughly rinse your body with warm water from the neck down.  DO NOT shower/wash with your normal soap after using and rinsing off the CHG Soap.  Pat yourself dry with a CLEAN TOWEL.  Wear CLEAN PAJAMAS to bed the night before surgery  Place CLEAN SHEETS on your bed the night before your surgery  DO NOT SLEEP WITH PETS.   Day of Surgery: Take a shower with CHG soap. Do not wear jewelry or makeup Do not wear lotions, powders, perfumes/colognes, or deodorant. Do not shave 48 hours prior to surgery.  Men may shave face and neck. Do not bring valuables to the hospital.  [facility] is not responsible for any belongings or valuables. Do not wear nail polish, gel polish, artificial nails, or any other type of covering on natural nails (fingers and toes) If you have artificial nails or gel coating that need to be removed by a nail salon, please have this removed prior to surgery. Artificial nails or gel coating may interfere with anesthesia's ability to adequately monitor your vital signs. Wear Clean/Comfortable clothing the morning of surgery Remember to brush your teeth WITH YOUR REGULAR TOOTHPASTE.   Please read over the following fact sheets that you were given.    If you received a COVID test during your pre-op visit  it is requested that you wear a mask when out in public, stay away from anyone that may not be feeling well and notify your surgeon if you develop symptoms. If you have been in contact with anyone that has tested positive in the last 10 days please notify you surgeon.

## 2023-04-26 ENCOUNTER — Encounter (HOSPITAL_COMMUNITY)
Admission: RE | Admit: 2023-04-26 | Discharge: 2023-04-26 | Disposition: A | Discharge status: Home / Self Care | Source: Ambulatory Visit | Attending: Thoracic Surgery (Cardiothoracic Vascular Surgery) | Admitting: Thoracic Surgery (Cardiothoracic Vascular Surgery)

## 2023-04-26 ENCOUNTER — Other Ambulatory Visit: Payer: Self-pay

## 2023-04-26 ENCOUNTER — Ambulatory Visit (HOSPITAL_COMMUNITY)
Admission: RE | Admit: 2023-04-26 | Discharge: 2023-04-26 | Disposition: A | Discharge status: Home / Self Care | Source: Ambulatory Visit | Attending: Thoracic Surgery (Cardiothoracic Vascular Surgery) | Admitting: Thoracic Surgery (Cardiothoracic Vascular Surgery)

## 2023-04-26 ENCOUNTER — Encounter (HOSPITAL_COMMUNITY): Payer: Self-pay

## 2023-04-26 ENCOUNTER — Ambulatory Visit (HOSPITAL_COMMUNITY)
Admission: RE | Admit: 2023-04-26 | Discharge: 2023-04-26 | Disposition: A | Discharge status: Home / Self Care | Source: Ambulatory Visit | Attending: Thoracic Surgery (Cardiothoracic Vascular Surgery)

## 2023-04-26 VITALS — BP 131/82 | HR 73 | Temp 98.4°F | Resp 17 | Ht 64.0 in | Wt 142.8 lb

## 2023-04-26 DIAGNOSIS — Z01818 Encounter for other preprocedural examination: Secondary | ICD-10-CM | POA: Diagnosis present

## 2023-04-26 DIAGNOSIS — I351 Nonrheumatic aortic (valve) insufficiency: Secondary | ICD-10-CM | POA: Insufficient documentation

## 2023-04-26 LAB — CBC
HCT: 40.8 % (ref 36.0–46.0)
Hemoglobin: 14.9 g/dL (ref 12.0–15.0)
MCH: 30.8 pg (ref 26.0–34.0)
MCHC: 36.5 g/dL — ABNORMAL HIGH (ref 30.0–36.0)
MCV: 84.5 fL (ref 80.0–100.0)
Platelets: 258 10*3/uL (ref 150–400)
RBC: 4.83 MIL/uL (ref 3.87–5.11)
RDW: 13.2 % (ref 11.5–15.5)
WBC: 5.8 10*3/uL (ref 4.0–10.5)
nRBC: 0 % (ref 0.0–0.2)

## 2023-04-26 LAB — URINALYSIS, ROUTINE W REFLEX MICROSCOPIC
Bilirubin Urine: NEGATIVE
Glucose, UA: >=500 mg/dL — AB
Ketones, ur: NEGATIVE mg/dL
Leukocytes,Ua: NEGATIVE
Nitrite: NEGATIVE
Protein, ur: NEGATIVE mg/dL
Specific Gravity, Urine: 1.029 (ref 1.005–1.030)
pH: 5 (ref 5.0–8.0)

## 2023-04-26 LAB — COMPREHENSIVE METABOLIC PANEL WITH GFR
ALT: 15 U/L (ref 0–44)
AST: 20 U/L (ref 15–41)
Albumin: 3.7 g/dL (ref 3.5–5.0)
Alkaline Phosphatase: 58 U/L (ref 38–126)
Anion gap: 7 (ref 5–15)
BUN: 15 mg/dL (ref 6–20)
CO2: 29 mmol/L (ref 22–32)
Calcium: 9.9 mg/dL (ref 8.9–10.3)
Chloride: 102 mmol/L (ref 98–111)
Creatinine, Ser: 0.98 mg/dL (ref 0.44–1.00)
GFR, Estimated: >60 mL/min (ref >60)
Glucose, Bld: 107 mg/dL — ABNORMAL HIGH (ref 70–99)
Potassium: 4.7 mmol/L (ref 3.5–5.1)
Sodium: 138 mmol/L (ref 135–145)
Total Bilirubin: 0.6 mg/dL (ref 0.0–1.2)
Total Protein: 7.2 g/dL (ref 6.5–8.1)

## 2023-04-26 LAB — HEMOGLOBIN A1C
Hgb A1c MFr Bld: 5.5 % (ref 4.8–5.6)
Mean Plasma Glucose: 111.15 mg/dL

## 2023-04-26 LAB — GLUCOSE, CAPILLARY: Glucose-Capillary: 142 mg/dL — ABNORMAL HIGH (ref 70–99)

## 2023-04-26 LAB — PROTIME-INR
INR: 1 (ref 0.8–1.2)
Prothrombin Time: 13.8 s (ref 11.4–15.2)

## 2023-04-26 LAB — TYPE AND SCREEN
ABO/RH(D): O POS
Antibody Screen: NEGATIVE

## 2023-04-26 LAB — SURGICAL PCR SCREEN
MRSA, PCR: POSITIVE — AB
Staphylococcus aureus: POSITIVE — AB

## 2023-04-26 LAB — APTT: aPTT: 31 s (ref 24–36)

## 2023-04-26 NOTE — Pre-Procedure Instructions (Signed)
 Surgical Instructions    Your procedure is scheduled on Friday, April 11th, 2025.   Report to [facility] Main Entrance "A" at 0530 A.M., then check in with the Admitting office.  Call this number if you have problems the morning of surgery:  [phone]  If you have any questions prior to your surgery date call [phone]: Open Monday-Friday 8am-4pm If you experience any cold or flu symptoms such as cough, fever, chills, shortness of breath, etc. between now and your scheduled surgery, please notify us at the above number.     Remember:  Do not eat or drink after midnight the night before your surgery   Take these medicines the morning of surgery with A SIP OF WATER:             carvedilol (COREG)              ezetimibe (ZETIA)              Hold clopidogrel (PLAVIX) for a week before surgery. Last dose- 04/20/2023           Hold empagliflozin (JARDIANCE) 3 days prior to your surgery. Last dose - 04/24/2022  Hold sacubitril-valsartan (ENTRESTO) 3 days prior to surgery. Last dose 04/24/22   As of today, STOP taking any Aspirin (unless otherwise instructed by your surgeon) Aleve, Naproxen, Ibuprofen, Motrin, Advil, Goody's, BC's, all herbal medications, fish oil, and all vitamins.                     Do NOT Smoke (Tobacco/Vaping) for 24 hours prior to your procedure.  If you use a CPAP at night, you may bring your mask/headgear for your overnight stay.   Contacts, glasses, piercing's, hearing aid's, dentures or partials may not be worn into surgery, please bring cases for these belongings.    For patients admitted to the hospital, discharge time will be determined by your treatment team.   Patients discharged the day of surgery will not be allowed to drive home, and someone needs to stay with them for 24 hours.  SURGICAL WAITING ROOM VISITATION Patients having surgery or a procedure may have no more than 2 support people in the waiting area - these visitors may rotate.    Children under the age of 20 must have an adult with them who is not the patient. If the patient needs to stay at the hospital during part of their recovery, the visitor guidelines for inpatient rooms apply. Pre-op nurse will coordinate an appropriate time for 1 support person to accompany patient in pre-op.  This support person may not rotate.   Please refer to the [facility] website for the visitor guidelines for Inpatients (after your surgery is over and you are in a regular room).    Special instructions:   [place]- Preparing For Surgery  Before surgery, you can play an important role. Because skin is not sterile, your skin needs to be as free of germs as possible. You can reduce the number of germs on your skin by washing with CHG (chlorahexidine gluconate) Soap before surgery.  CHG is an antiseptic cleaner which kills germs and bonds with the skin to continue killing germs even after washing.    Oral Hygiene is also important to reduce your risk of infection.  Remember - BRUSH YOUR TEETH THE MORNING OF SURGERY WITH YOUR REGULAR TOOTHPASTE  Please do not use if you have an allergy to CHG or antibacterial soaps. If your skin becomes reddened/irritated stop  using the CHG.  Do not shave (including legs and underarms) for at least 48 hours prior to first CHG shower. It is OK to shave your face.  Please follow these instructions carefully.   Shower the NIGHT BEFORE SURGERY and the MORNING OF SURGERY  If you chose to wash your hair, wash your hair first as usual with your normal shampoo.  After you shampoo, rinse your hair and body thoroughly to remove the shampoo.  Use CHG Soap as you would any other liquid soap. You can apply CHG directly to the skin and wash gently with a scrungie or a clean washcloth.   Apply the CHG Soap to your body ONLY FROM THE NECK DOWN.  Do not use on open wounds or open sores. Avoid contact with your eyes, ears, mouth and genitals (private parts). Wash Face  and genitals (private parts)  with your normal soap.   Wash thoroughly, paying special attention to the area where your surgery will be performed.  Thoroughly rinse your body with warm water from the neck down.  DO NOT shower/wash with your normal soap after using and rinsing off the CHG Soap.  Pat yourself dry with a CLEAN TOWEL.  Wear CLEAN PAJAMAS to bed the night before surgery  Place CLEAN SHEETS on your bed the night before your surgery  DO NOT SLEEP WITH PETS.   Day of Surgery: Take a shower with CHG soap. Do not wear jewelry or makeup Do not wear lotions, powders, perfumes/colognes, or deodorant. Do not shave 48 hours prior to surgery.  Men may shave face and neck. Do not bring valuables to the hospital.  [facility] is not responsible for any belongings or valuables. Do not wear nail polish, gel polish, artificial nails, or any other type of covering on natural nails (fingers and toes) If you have artificial nails or gel coating that need to be removed by a nail salon, please have this removed prior to surgery. Artificial nails or gel coating may interfere with anesthesia's ability to adequately monitor your vital signs. Wear Clean/Comfortable clothing the morning of surgery Remember to brush your teeth WITH YOUR REGULAR TOOTHPASTE.   Please read over the following fact sheets that you were given.    If you received a COVID test during your pre-op visit  it is requested that you wear a mask when out in public, stay away from anyone that may not be feeling well and notify your surgeon if you develop symptoms. If you have been in contact with anyone that has tested positive in the last 10 days please notify you surgeon.

## 2023-04-26 NOTE — Progress Notes (Signed)
 PCP - Dr. Hoy Register Cardiologist - Dr. Marca Ancona  PPM/ICD - denies   Chest x-ray - 04/26/23 EKG - 04/26/23 Stress Test - denies ECHO - 01/24/23 Cardiac Cath - 04/05/23  Sleep Study - denies   DM- pt does not check CBG at home and does not know typical fasting levels  Last dose of GLP1 agonist- n/a    Blood Thinner Instructions: Hold Plavix 1 week. Pt took last dose 4/4 Aspirin Instructions: n/a  ERAS Protcol - no, NPO   COVID TEST- n/a   Anesthesia review: yes, cardiac hx  Patient denies shortness of breath, fever, cough and chest pain at PAT appointment   All instructions explained to the patient, with a verbal understanding of the material. Patient agrees to go over the instructions while at home for a better understanding. The opportunity to ask questions was provided.

## 2023-04-26 NOTE — Progress Notes (Signed)
Darius Bump, RN, notified of positive PCR results

## 2023-04-27 ENCOUNTER — Other Ambulatory Visit: Payer: Self-pay | Admitting: *Deleted

## 2023-04-27 DIAGNOSIS — I351 Nonrheumatic aortic (valve) insufficiency: Secondary | ICD-10-CM

## 2023-04-27 MED ORDER — MANNITOL 20 % IV SOLN
INTRAVENOUS | Status: DC
  Filled 2023-04-27: qty 13

## 2023-04-27 MED ORDER — HEPARIN 30,000 UNITS/1000 ML (OHS) CELLSAVER SOLUTION
Status: DC
  Filled 2023-04-27: qty 1000

## 2023-04-27 MED ORDER — INSULIN REGULAR(HUMAN) IN NACL 100-0.9 UT/100ML-% IV SOLN
INTRAVENOUS | Status: AC
  Administered 2023-04-28: 1.2 [IU]/h via INTRAVENOUS
  Filled 2023-04-27: qty 100

## 2023-04-27 MED ORDER — VANCOMYCIN HCL 1250 MG/250ML IV SOLN
1250.0000 mg | INTRAVENOUS | Status: DC
  Filled 2023-04-27: qty 250

## 2023-04-27 MED ORDER — TRANEXAMIC ACID (OHS) PUMP PRIME SOLUTION
2.0000 mg/kg | INTRAVENOUS | Status: DC
  Filled 2023-04-27: qty 1.3

## 2023-04-27 MED ORDER — PHENYLEPHRINE HCL-NACL 20-0.9 MG/250ML-% IV SOLN
30.0000 ug/min | INTRAVENOUS | Status: DC
  Filled 2023-04-27: qty 250

## 2023-04-27 MED ORDER — PLASMA-LYTE A IV SOLN
INTRAVENOUS | Status: DC
  Filled 2023-04-27: qty 2.5

## 2023-04-27 MED ORDER — POTASSIUM CHLORIDE 2 MEQ/ML IV SOLN
80.0000 meq | INTRAVENOUS | Status: DC
  Filled 2023-04-27: qty 40

## 2023-04-27 MED ORDER — NOREPINEPHRINE 4 MG/250ML-% IV SOLN
0.0000 ug/min | INTRAVENOUS | Status: DC
Start: 2023-04-28 — End: 2023-04-29
  Filled 2023-04-27: qty 250

## 2023-04-27 MED ORDER — TRANEXAMIC ACID (OHS) BOLUS VIA INFUSION
15.0000 mg/kg | INTRAVENOUS | Status: AC
  Administered 2023-04-28: 972 mg via INTRAVENOUS
  Filled 2023-04-27: qty 972

## 2023-04-27 MED ORDER — VANCOMYCIN HCL 1000 MG IV SOLR
INTRAVENOUS | Status: AC
  Administered 2023-04-28: 1250 mL
  Filled 2023-04-27: qty 20

## 2023-04-27 MED ORDER — CEFAZOLIN SODIUM-DEXTROSE 2-4 GM/100ML-% IV SOLN
2.0000 g | INTRAVENOUS | Status: AC
  Administered 2023-04-28: 2 g via INTRAVENOUS
  Filled 2023-04-27: qty 100

## 2023-04-27 MED ORDER — DEXMEDETOMIDINE HCL IN NACL 400 MCG/100ML IV SOLN
0.1000 ug/kg/h | INTRAVENOUS | Status: AC
  Administered 2023-04-28: .4 ug/kg/h via INTRAVENOUS
  Filled 2023-04-27: qty 100

## 2023-04-27 MED ORDER — MILRINONE LACTATE IN DEXTROSE 20-5 MG/100ML-% IV SOLN
0.3000 ug/kg/min | INTRAVENOUS | Status: DC
  Filled 2023-04-27: qty 100

## 2023-04-27 MED ORDER — NITROGLYCERIN IN D5W 200-5 MCG/ML-% IV SOLN
2.0000 ug/min | INTRAVENOUS | Status: DC
  Filled 2023-04-27: qty 250

## 2023-04-27 MED ORDER — EPINEPHRINE HCL 5 MG/250ML IV SOLN IN NS
0.0000 ug/min | INTRAVENOUS | Status: DC
  Filled 2023-04-27: qty 250

## 2023-04-27 MED ORDER — TRANEXAMIC ACID 1000 MG/10ML IV SOLN
1.5000 mg/kg/h | INTRAVENOUS | Status: AC
  Administered 2023-04-28: 1.5 mg/kg/h via INTRAVENOUS
  Filled 2023-04-27: qty 25

## 2023-04-27 MED ORDER — CEFAZOLIN SODIUM-DEXTROSE 2-4 GM/100ML-% IV SOLN
2.0000 g | INTRAVENOUS | Status: AC
Start: 2023-04-28 — End: 2023-04-29
  Administered 2023-04-28: 2 g via INTRAVENOUS
  Filled 2023-04-27: qty 100

## 2023-04-27 NOTE — Anesthesia Preprocedure Evaluation (Addendum)
 Anesthesia Evaluation  Patient identified by MRN, date of birth, ID band Patient awake    Reviewed: Allergy & Precautions, NPO status , Patient's Chart, lab work & pertinent test results  History of Anesthesia Complications Negative for: history of anesthetic complications  Airway Mallampati: II  TM Distance: >3 FB Neck ROM: Full    Dental  (+) Dental Advisory Given   Pulmonary neg shortness of breath, neg sleep apnea, neg COPD, neg recent URI, Current Smoker and Patient abstained from smoking.   breath sounds clear to auscultation       Cardiovascular hypertension, + CAD and + Past MI  + Valvular Problems/Murmurs AS and AI  Rhythm:Regular   Pt has been medically optimized so that most recent echo with EF of 55% with severe AI and moderate AS.    Neuro/Psych TIACVA    GI/Hepatic negative GI ROS,,,(+)     substance abuse    Endo/Other  negative endocrine ROS    Renal/GU negative Renal ROS     Musculoskeletal negative musculoskeletal ROS (+)    Abdominal   Peds  Hematology negative hematology ROS (+)   Anesthesia Other Findings   Reproductive/Obstetrics                              Anesthesia Physical Anesthesia Plan  ASA: 4  Anesthesia Plan: General   Post-op Pain Management:    Induction: Intravenous  PONV Risk Score and Plan: 3 and Ondansetron  Airway Management Planned: Oral ETT  Additional Equipment: Arterial line, CVP, PA Cath, TEE and Ultrasound Guidance Line Placement  Intra-op Plan:   Post-operative Plan: Post-operative intubation/ventilation  Informed Consent: I have reviewed the patients History and Physical, chart, labs and discussed the procedure including the risks, benefits and alternatives for the proposed anesthesia with the patient or authorized representative who has indicated his/her understanding and acceptance.     Dental advisory given  Plan  Discussed with: CRNA  Anesthesia Plan Comments: (PAT note written 04/27/2023 by Allison Zelenak, PA-C.  )        Anesthesia Quick Evaluation

## 2023-04-27 NOTE — Progress Notes (Signed)
 Anesthesia Chart Review:  Case: [id number] Date/Time: 04/28/23 0715   Procedures:      REPLACEMENT, AORTIC VALVE, OPEN (Chest)     CORONARY ARTERY BYPASS GRAFTING (CABG) (Chest)     ECHOCARDIOGRAM, TRANSESOPHAGEAL, INTRAOPERATIVE   Anesthesia type: General   Diagnosis:      Severe aortic insufficiency [I35.1]     Moderate aortic stenosis [I35.0]   Pre-op diagnosis:      SEVERE AI     MODERATE AS   [facility] / [facility]   Surgeons: Eugenio Hoes, MD       DISCUSSION: Patient is a 58 year old female scheduled for the above procedures.  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  Last Plavix 04/21/23. Last Jardiance 04/24/23.  Anesthesia team to evaluate on the day of surgery.  VS: BP 131/82   Pulse 73   Temp 36.9 C   Resp 17   Ht 5\' 4"  (1.626 m)   Wt 64.8 kg   LMP  (LMP Unknown) Comment: Pt unsure why she no longer gets her period  SpO2 100%   BMI 24.51 kg/m   PROVIDERS: Hoy Register, MD is PCP  Marca Ancona, MD is HF cardiologist  LABS: Preoperative labs noted. (all labs ordered are listed, but only abnormal results are displayed)  Labs Reviewed  SURGICAL PCR SCREEN - Abnormal; Notable for the following components:      Result Value   MRSA, PCR POSITIVE (*)    Staphylococcus aureus POSITIVE (*)    All other components within normal limits  GLUCOSE, CAPILLARY - Abnormal; Notable for the following components:   Glucose-Capillary 142 (*)    All other components within normal limits  CBC - Abnormal; Notable for the following components:   MCHC 36.5 (*)    All other components within normal limits  COMPREHENSIVE METABOLIC PANEL WITH GFR - Abnormal; Notable for the following components:   Glucose, Bld 107 (*)    All other components  within normal limits  URINALYSIS, ROUTINE W REFLEX MICROSCOPIC - Abnormal; Notable for the following components:   Glucose, UA >=500 (*)    Hgb urine dipstick SMALL (*)    Bacteria, UA RARE (*)    All other components within normal limits  PROTIME-INR  APTT  HEMOGLOBIN A1C  TYPE AND SCREEN     IMAGES: CXR 04/26/23: FINDINGS: - Cardiomediastinal silhouette within normal limits. - Evidence of prior PTC/coronary disease. - Event recorder on the left chest wall. - Improved aeration of the lungs with interval resolution of edema and pleural effusions. - No pneumothorax. No airspace disease. Coarsened interstitial markings. - No displaced fracture IMPRESSION: Negative for acute cardiopulmonary disease  CT Chest 04/21/23: IMPRESSION: 1. No acute intrathoracic abnormality. Specifically, no pneumonia, pulmonary edema, or pleural effusion. 2. A few scattered bilateral pulmonary nodules, as measured above, unchanged, and benign. 3. Small amount of layering hyperdense material within the mid and distal esophagus, possibly due to incomplete emptying or gastroesophageal reflux disease. - Aortic Atherosclerosis (ICD10-I70.0).    EKG: 04/26/23: Normal sinus rhythm Possible Left atrial enlargement Septal infarct , age undetermined Abnormal ECG When compared with ECG of 07-Dec-2022 15:09, PREVIOUS ECG IS PRESENT No significant change since last tracing Confirmed by Nicki Guadalajara ([id number]) on 04/26/2023 6:48:47 PM   CV: US  Carotid 04/26/23: Summary:  - Right Carotid: Velocities in the right ICA are consistent with a 1-39%  stenosis.  - Left Carotid: Velocities in the left ICA are consistent with a 1-39%  stenosis.  - Vertebrals: Bilateral vertebral arteries demonstrate antegrade flow.  - Subclavians: Bilateral subclavian arteries were stenotic.    Cardiac cath 04/05/23:   Mid LAD lesion is 40% stenosed.   Prox RCA lesion is 30% stenosed.   1st Diag lesion is 40% stenosed.   Prox LAD  lesion is 40% stenosed.   Mid Cx lesion is 80% stenosed.   Non-stenotic Mid RCA lesion was previously treated.  1. Normal filling pressures.  2. Preserved cardiac output.  3. Normal PA pressure.  4. 80% stenosis in the mid LCx.     TTE 01/24/23: IMPRESSIONS   1. Left ventricular ejection fraction, by estimation, is 55 to 60%. The  left ventricle has normal function. The left ventricle has no regional  wall motion abnormalities. Left ventricular diastolic parameters were  normal.   2. Right ventricular systolic function is normal. The right ventricular  size is normal. There is normal pulmonary artery systolic pressure.   3. A small pericardial effusion is present. The pericardial effusion is  localized near the right ventricle.   4. The mitral valve is normal in structure. Trivial mitral valve  regurgitation. No evidence of mitral stenosis.   5. The aortic valve is bicuspid. There is mild calcification of the  aortic valve. Aortic valve regurgitation is moderate to severe. Mild to  moderate aortic valve stenosis. Aortic valve mean gradient measures 11.0  mmHg.   6. The inferior vena cava is normal in size with <50% respiratory  variability, suggesting right atrial pressure of 8 mmHg.  - Comparison(s): No significant change from prior study.    MR Cardiac 11/23/21: IMPRESSION: 1.  Small-moderate pericardial effusion without tamponade. 2.  Moderately dilated LV with diffuse hypokinesis, EF 18%. 3.  Normal RV size with mildly decreased systolic function, EF 48%. 4. Bicuspid aortic valve with severe aortic insufficiency, regurgitant fraction 38%. 5. Non-coronary LGE pattern. RV insertion LGE is nonspecific and suggestive of pressure/volume overload. The mid-wall LGE in the inferolateral wall could be due to prior myocarditis though interestingly patient has had significant coronary disease in the LCx chronically.   Past Medical History:  Diagnosis Date   Chronic systolic heart  failure (HCC)    Cocaine abuse (HCC)    last use about 1 year ago (04/26/23)   Coronary artery disease    Dyslipidemia    Hypertension    Noncompliance w/medication treatment due to intermit use of medication    Stroke (HCC) 2021   no deficits    Past Surgical History:  Procedure Laterality Date   CORONARY PRESSURE/FFR STUDY N/A 10/09/2020   Procedure: INTRAVASCULAR PRESSURE WIRE/FFR STUDY;  Surgeon: Lyn Records, MD;  [facility];  Service: Cardiovascular;  Laterality: N/A;   CORONARY STENT INTERVENTION N/A 10/09/2020   Procedure: CORONARY STENT INTERVENTION;  Surgeon: Lyn Records, MD;  [facility] CV [facility];  Service: Cardiovascular;  Laterality: N/A;   LEFT HEART CATH AND CORONARY ANGIOGRAPHY N/A 10/09/2020   Procedure: LEFT HEART CATH AND CORONARY ANGIOGRAPHY;  Surgeon: Lyn Records, MD;  [facility];  Service: Cardiovascular;  Laterality: N/A;   LOOP RECORDER INSERTION N/A 06/27/2018   Procedure: LOOP RECORDER INSERTION;  Surgeon: Regan Lemming, MD;  [facility] CV [facility];  Service: Cardiovascular;  Laterality: N/A;   RIGHT HEART CATH AND CORONARY ANGIOGRAPHY N/A 04/05/2023   Procedure: RIGHT HEART CATH AND CORONARY ANGIOGRAPHY;  Surgeon: Laurey Morale, MD;  Location: [facility] INVASIVE CV LAB;  Service: Cardiovascular;  Laterality: N/A;   RIGHT/LEFT HEART CATH AND CORONARY ANGIOGRAPHY N/A 11/22/2021   Procedure: RIGHT/LEFT HEART CATH AND CORONARY ANGIOGRAPHY;  Surgeon: Laurey Morale, MD;  Location: [facility] INVASIVE CV LAB;  Service: Cardiovascular;  Laterality: N/A;   TEE WITHOUT CARDIOVERSION N/A 11/23/2021   Procedure: TRANSESOPHAGEAL ECHOCARDIOGRAM (TEE);  Surgeon: Laurey Morale, MD;  Location: [facility] ENDOSCOPY;  Service: Cardiovascular;  Laterality: N/A;   TEE WITHOUT CARDIOVERSION N/A 04/19/2022   Procedure: TRANSESOPHAGEAL ECHOCARDIOGRAM (TEE);  Surgeon: Laurey Morale, MD;  Location: [facility] ENDOSCOPY;  Service: Cardiovascular;   Laterality: N/A;    MEDICATIONS:  atorvastatin (LIPITOR) 40 MG tablet   carvedilol (COREG) 6.25 MG tablet   clopidogrel (PLAVIX) 75 MG tablet   empagliflozin (JARDIANCE) 10 MG TABS tablet   Evolocumab (REPATHA SURECLICK) 140 MG/ML SOAJ   ezetimibe (ZETIA) 10 MG tablet   sacubitril-valsartan (ENTRESTO) 24-26 MG   spironolactone (ALDACTONE) 25 MG tablet   No current facility-administered medications for this encounter.    Shonna Chock, PA-C Surgical Short Stay/Anesthesiology [facility] Phone [phone] [facility] Phone [phone] 04/27/2023 9:50 AM

## 2023-04-27 NOTE — Hospital Course (Addendum)
 HPI: This is a 58 year old female who has a complicated cardiac history with previous CAD (s/p stenting) and cardiomyupathy with EF in the past below 20% along with severe aortic insufficiency. She has been medically optimized so that most recent echo with EF of 55% with severe AI and moderate AS. She has been feeling fatigued without lower extremity edema nor lightheadedness. She was seen in AHF clinic and was felt to require SAVR and a cardiac catheterization was scheduled for 3/19. She also has history of heroin and cocaine addiction that she reports only 1-2 times a week snorting heroin. She denies IV drug injections.  Cardiac catheterization showed mid Circumflex with an 80% stenosis. Dr. Honey Lusty discussed aortic valve replacement with the patient. Potential risks, benefits, and complications of the surgery were discussed with the patient and she agreed to proceed with surgery.  Hospital Course: Patient underwent an aortic valve replacement (Size 21 mm). She was transported from the OR to [facility] ICU in stable condition. She was extubated the afternoon of surgery without complication. She was hypertensive and required Cleviprex drip. This was weaned off on 04/13. She was started on Coreg and Entresto. She was on Amiodarone for afib prophylaxis. Swan ganz catheter and arterial line were removed without complication. She was routinely diuresed and returned to preoperative weight. Epicardial pacing wires and chest tubes were removed on 04/13 without complication. She was transitioned off the Insulin drip. Her pre op HGA1C was 5.5. Accu checks and SS PRN will be stopped after transfer to the floor. She was stable for transfer from the ICU to 4E for further convalescence on 04/13. She had hypotension the evening of 04/13 (was asymptomatic). Entresto, Amiodarone and Trazadone were held. BP improved and post op day 3, her HR was in the 100's. She was continued on oral Amiodarone and Coreg. Entresto was stopped and  Spironolactone was held. With persistent tachycardia and BP much improved, Coreg was increased to 12.5 mg bid on 04/15. She was restarted on Plavix (history of CVA) and ec asa was decreased to 81 mg daily. WBC was 18,100 04/13 and on re check was down to 12,600. She had mild thrombocytopenia with last platelets at 133,000. She has been ambulating on room air with good oxygenation. She has been tolerating a diet and has had a bowel movement. Sternal wound is clean, dry, healing without signs of infection.

## 2023-04-27 NOTE — H&P (Signed)
 [address]       [place] [address]             [phone]                                   [facility] Medical Record #[id number] Date of Birth: 04/13/1965   Alen Bleacher, NP Hoy Register, MD   Chief Complaint:  Fatigue and AI/AS   History of Present Illness:     Pt is a pleasant 58 yo female who has a complicated cardiac history with previous CAD s/p stenting and cardiomyupathy with EF in the past below 20% with severe AI. Pt has been medically optimized so that most recent echo with EF of 55% with severe AI and moderate AS. She has been feeling fatigued without lower ext edema nor lightheadedness. She was seen in AHF clinic and was felt to require SAVR and cath scheduled for 3/19. She also has history of heroin and cocaine addiction that she reports only 1-2 times a week snorting heroin. She denies IV injections.              Past Medical History:  Diagnosis Date   Chronic systolic heart failure (HCC)     Cocaine abuse (HCC)     Dyslipidemia     Hypertension     Noncompliance w/medication treatment due to intermit use of medication     Stroke [facility]) 2021    no deficits               Past Surgical History:  Procedure Laterality Date   CORONARY PRESSURE/FFR STUDY N/A 10/09/2020    Procedure: INTRAVASCULAR PRESSURE WIRE/FFR STUDY;  Surgeon: Lyn Records, MD;  [facility] CV LAB;  Service: Cardiovascular;  Laterality: N/A;   CORONARY STENT INTERVENTION N/A 10/09/2020    Procedure: CORONARY STENT INTERVENTION;  Surgeon: Lyn Records, MD;  [facility]VAS[facility] CV LAB;  Service: Cardiovascular;  Laterality: N/A;   LEFT HEART CATH AND CORONARY ANGIOGRAPHY N/A 10/09/2020    Procedure: LEFT HEART CATH AND CORONARY ANGIOGRAPHY;  Surgeon: Lyn Records, MD;  [facility] CV LAB;  Service: Cardiovascular;  Laterality: N/A;   LOOP RECORDER INSERTION N/A 06/27/2018    Procedure: LOOP RECORDER INSERTION;  Surgeon:  Regan Lemming, MD;  [facility]VAS[facility] CV LAB;  Service: Cardiovascular;  Laterality: N/A;   RIGHT/LEFT HEART CATH AND CORONARY ANGIOGRAPHY N/A 11/22/2021    Procedure: RIGHT/LEFT HEART CATH AND CORONARY ANGIOGRAPHY;  Surgeon: Laurey Morale, MD;  Location: [facility] INVASIVE CV LAB;  Service: Cardiovascular;  Laterality: N/A;   TEE WITHOUT CARDIOVERSION N/A 11/23/2021    Procedure: TRANSESOPHAGEAL ECHOCARDIOGRAM (TEE);  Surgeon: Laurey Morale, MD;  Location: [facility] ENDOSCOPY;  Service: Cardiovascular;  Laterality: N/A;   TEE WITHOUT CARDIOVERSION N/A 04/19/2022    Procedure: TRANSESOPHAGEAL ECHOCARDIOGRAM (TEE);  Surgeon: Laurey Morale, MD;  Location: [facility] ENDOSCOPY;  Service: Cardiovascular;  Laterality: N/A;          Tobacco Use History  Social History        Tobacco Use  Smoking Status Some Days   Current packs/day: 1.00   Types: Cigars, Cigarettes  Smokeless Tobacco Never      Social History        Substance and Sexual Activity  Alcohol Use Not Currently    Comment: social drinking  Social History         Socioeconomic History   Marital status: Single      Spouse name: Not on file   Number of children: 3   Years of education: Not on file   Highest education level: 12th grade  Occupational History   Not on file  Tobacco Use   Smoking status: Some Days      Current packs/day: 1.00      Types: Cigars, Cigarettes   Smokeless tobacco: Never  Vaping Use   Vaping status: Never Used  Substance and Sexual Activity   Alcohol use: Not Currently      Comment: social drinking   Drug use: Not Currently      Types: Marijuana, Cocaine      Comment: 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   Sexual activity: Not Currently      Birth control/protection: Post-menopausal  Other Topics Concern   Not on file  Social History Narrative   Not on file    Social Drivers of Health        Financial Resource Strain: High Risk (12/14/2021)    Overall Financial Resource Strain  (CARDIA)     Difficulty of Paying Living Expenses: Hard  Food Insecurity: No Food Insecurity (12/14/2021)    Hunger Vital Sign     Worried About Running Out of Food in the Last Year: Never true     Ran Out of Food in the Last Year: Never true  Transportation Needs: No Transportation Needs (12/14/2021)    PRAPARE - Therapist, art (Medical): No     Lack of Transportation (Non-Medical): No  Physical Activity: Inactive (02/21/2017)    Exercise Vital Sign     Days of Exercise per Week: 0 days     Minutes of Exercise per Session: 0 min  Stress: No Stress Concern Present (02/21/2017)    [employer] of Occupational Health - Occupational Stress Questionnaire     Feeling of Stress : Not at all  Social Connections: Somewhat Isolated (02/21/2017)    Social Connection and Isolation Panel [NHANES]     Frequency of Communication with Friends and Family: More than three times a week     Frequency of Social Gatherings with Friends and Family: Once a week     Attends Religious Services: 1 to 4 times per year     Active Member of [employer] or Organizations: No     Attends Banker Meetings: Never     Marital Status: Never married  Intimate Partner Violence: Not At Risk (11/19/2021)    Humiliation, Afraid, Rape, and Kick questionnaire     Fear of Current or Ex-Partner: No     Emotionally Abused: No     Physically Abused: No     Sexually Abused: No      Allergies  No Known Allergies           Current Outpatient Medications  Medication Sig Dispense Refill   atorvastatin (LIPITOR) 40 MG tablet Take 1 tablet (40 mg total) by mouth daily at 6 PM. 90 tablet 3   carvedilol (COREG) 6.25 MG tablet Take 1 tablet (6.25 mg total) by mouth 2 (two) times daily. 180 tablet 3   clopidogrel (PLAVIX) 75 MG tablet Take 1 tablet (75 mg total) by mouth daily. 90 tablet 3   empagliflozin (JARDIANCE) 10 MG TABS tablet Take 1 tablet (10 mg total) by mouth daily. 30 tablet 3    Evolocumab (  REPATHA SURECLICK) 140 MG/ML SOAJ Inject 140 mg into the skin every 14 (fourteen) days. 6 mL 3   ezetimibe (ZETIA) 10 MG tablet Take 1 tablet (10 mg total) by mouth daily. 90 tablet 3   sacubitril-valsartan (ENTRESTO) 24-26 MG Take 1 tablet by mouth 2 (two) times daily. 180 tablet 3   spironolactone (ALDACTONE) 25 MG tablet Take 1 tablet (25 mg total) by mouth daily. 90 tablet 3      No current facility-administered medications for this visit.               Family History  Problem Relation Age of Onset   Hypertension Mother     Hypertension Father     Hypertension Brother     Stroke Sister                  Physical Exam: LMP  (LMP Unknown) Comment: Pt unsure why she no longer gets her period Lungs: overall clear Card: RR with 3/6 systolic and 2/6 diastolic murmurs Ext: No edema or vericosities Neuro: intact Edentulous       Diagnostic Studies & Laboratory data: I have personally reviewed the following studies and agree with the findings   TTE (01/2023) IMPRESSIONS     1. Left ventricular ejection fraction, by estimation, is 55 to 60%. The  left ventricle has normal function. The left ventricle has no regional  wall motion abnormalities. Left ventricular diastolic parameters were  normal.   2. Right ventricular systolic function is normal. The right ventricular  size is normal. There is normal pulmonary artery systolic pressure.   3. A small pericardial effusion is present. The pericardial effusion is  localized near the right ventricle.   4. The mitral valve is normal in structure. Trivial mitral valve  regurgitation. No evidence of mitral stenosis.   5. The aortic valve is bicuspid. There is mild calcification of the  aortic valve. Aortic valve regurgitation is moderate to severe. Mild to  moderate aortic valve stenosis. Aortic valve mean gradient measures 11.0  mmHg.   6. The inferior vena cava is normal in size with <50% respiratory  variability,  suggesting right atrial pressure of 8 mmHg.   Comparison(s): No significant change from prior study.   FINDINGS   Left Ventricle: Left ventricular ejection fraction, by estimation, is 55  to 60%. The left ventricle has normal function. The left ventricle has no  regional wall motion abnormalities. The left ventricular internal cavity  size was normal in size. There is   no left ventricular hypertrophy. Left ventricular diastolic parameters  were normal.   Right Ventricle: The right ventricular size is normal. No increase in  right ventricular wall thickness. Right ventricular systolic function is  normal. There is normal pulmonary artery systolic pressure. The tricuspid  regurgitant velocity is 2.22 m/s, and   with an assumed right atrial pressure of 8 mmHg, the estimated right  ventricular systolic pressure is 27.7 mmHg.   Left Atrium: Left atrial size was normal in size.   Right Atrium: Right atrial size was normal in size.   Pericardium: A small pericardial effusion is present. The pericardial  effusion is localized near the right ventricle.   Mitral Valve: The mitral valve is normal in structure. Trivial mitral  valve regurgitation. No evidence of mitral valve stenosis.   Tricuspid Valve: The tricuspid valve is normal in structure. Tricuspid  valve regurgitation is trivial. No evidence of tricuspid stenosis.   Aortic Valve: Fused NCC-RCC. The aortic valve  is bicuspid. There is mild  calcification of the aortic valve. Aortic valve regurgitation is moderate  to severe. Aortic regurgitation PHT measures 609 msec. Mild to moderate  aortic stenosis is present. Aortic  valve mean gradient measures 11.0 mmHg. Aortic valve peak gradient  measures 21.9 mmHg. Aortic valve area, by VTI measures 1.07 cm.   Pulmonic Valve: The pulmonic valve was not well visualized. Pulmonic valve  regurgitation is not visualized.   Aorta: The aortic root, ascending aorta, aortic arch and descending  aorta  are all structurally normal, with no evidence of dilitation or  obstruction.   Venous: The inferior vena cava is normal in size with less than 50%  respiratory variability, suggesting right atrial pressure of 8 mmHg.   IAS/Shunts: No atrial level shunt detected by color flow Doppler.     LEFT VENTRICLE  PLAX 2D  LVIDd:         4.00 cm   Diastology  LVIDs:         2.80 cm   LV e' medial:    8.27 cm/s  LV PW:         0.80 cm   LV E/e' medial:  6.8  LV IVS:        0.70 cm   LV e' lateral:   8.27 cm/s  LVOT diam:     1.60 cm   LV E/e' lateral: 6.8  LV SV:         37  LV SV Index:   22  LVOT Area:     2.01 cm     RIGHT VENTRICLE             IVC  RV Basal diam:  2.50 cm     IVC diam: 1.10 cm  RV S prime:     13.50 cm/s  TAPSE (M-mode): 1.8 cm   LEFT ATRIUM             Index        RIGHT ATRIUM          Index  LA Vol (A2C):   27.0 ml 15.97 ml/m  RA Area:     9.90 cm  LA Vol (A4C):   37.0 ml 21.89 ml/m  RA Volume:   21.70 ml 12.84 ml/m  LA Biplane Vol: 34.3 ml 20.29 ml/m   AORTIC VALVE  AV Area (Vmax):    0.94 cm  AV Area (Vmean):   0.96 cm  AV Area (VTI):     1.07 cm  AV Vmax:           234.25 cm/s  AV Vmean:          154.000 cm/s  AV VTI:            0.348 m  AV Peak Grad:      21.9 mmHg  AV Mean Grad:      11.0 mmHg  LVOT Vmax:         109.00 cm/s  LVOT Vmean:        73.900 cm/s  LVOT VTI:          0.186 m  LVOT/AV VTI ratio: 0.53  AI PHT:            609 msec  AR Vena Contracta: 0.90 cm    AORTA  Ao Root diam: 2.80 cm  Ao Asc diam:  2.50 cm   MITRAL VALVE               TRICUSPID  VALVE  MV Area (PHT): 4.48 cm    TR Peak grad:   19.7 mmHg  MV Decel Time: 170 msec    TR Vmax:        222.00 cm/s  MV E velocity: 56.15 cm/s  MV A velocity: 82.95 cm/s  SHUNTS  MV E/A ratio:  0.68        Systemic VTI:  0.19 m                             Systemic Diam: 1.60 cm     Recent Radiology Findings:        Recent Lab Findings: Recent Labs       Lab Results   Component Value Date    WBC 5.7 03/09/2023    HGB 14.2 03/09/2023    HCT 38.5 03/09/2023    PLT 243 03/09/2023    GLUCOSE 100 (H) 03/09/2023    CHOL 136 12/07/2022    TRIG 56 12/07/2022    HDL 51 12/07/2022    LDLCALC 74 12/07/2022    ALT 18 11/19/2021    AST 26 11/19/2021    NA 142 03/09/2023    K 5.0 03/09/2023    CL 106 03/09/2023    CREATININE 0.95 03/09/2023    BUN 11 03/09/2023    CO2 23 03/09/2023    INR 0.9 06/24/2018    HGBA1C 5.6 10/09/2020            Assessment / Plan:     58 yo female with NYHA class 2 symptoms of severe AI/moderate AS with now normal LV function. Awaiting cath to determine extent of CAD. I agree with SAVR and possible CABG while she is symptomatic and also with regained normal LV function. We discussed the options for valve replacement and would best be served with tissue valve secondary to her drug use currently. She understands the need for possible rereplacement in the future. She will need to stop her plavix a week before surgery and is currently planned for 4/11. She will need noncontrast CT of chest. I discussed all the risks, goals, and recovery from surgery and discussed this also with son on her phone. She wishes to proceed.

## 2023-04-28 ENCOUNTER — Inpatient Hospital Stay (HOSPITAL_COMMUNITY)

## 2023-04-28 ENCOUNTER — Encounter (HOSPITAL_COMMUNITY): Payer: Self-pay | Admitting: Thoracic Surgery (Cardiothoracic Vascular Surgery)

## 2023-04-28 ENCOUNTER — Inpatient Hospital Stay (HOSPITAL_COMMUNITY)
Admission: RE | Disposition: A | Discharge status: Home / Self Care | Payer: Self-pay | Source: Home / Self Care | Attending: Thoracic Surgery (Cardiothoracic Vascular Surgery)

## 2023-04-28 ENCOUNTER — Inpatient Hospital Stay (HOSPITAL_COMMUNITY): Payer: Self-pay | Admitting: Anesthesiology

## 2023-04-28 ENCOUNTER — Other Ambulatory Visit: Payer: Self-pay

## 2023-04-28 ENCOUNTER — Inpatient Hospital Stay (HOSPITAL_COMMUNITY)
Admission: RE | Admit: 2023-04-28 | Discharge: 2023-05-03 | DRG: 220 | Disposition: A | Discharge status: Home / Self Care | Attending: Thoracic Surgery (Cardiothoracic Vascular Surgery) | Admitting: Thoracic Surgery (Cardiothoracic Vascular Surgery)

## 2023-04-28 DIAGNOSIS — F1420 Cocaine dependence, uncomplicated: Secondary | ICD-10-CM | POA: Diagnosis present

## 2023-04-28 DIAGNOSIS — Z5986 Financial insecurity: Secondary | ICD-10-CM

## 2023-04-28 DIAGNOSIS — I110 Hypertensive heart disease with heart failure: Secondary | ICD-10-CM | POA: Diagnosis present

## 2023-04-28 DIAGNOSIS — I352 Nonrheumatic aortic (valve) stenosis with insufficiency: Principal | ICD-10-CM | POA: Diagnosis present

## 2023-04-28 DIAGNOSIS — I959 Hypotension, unspecified: Secondary | ICD-10-CM | POA: Diagnosis not present

## 2023-04-28 DIAGNOSIS — D72828 Other elevated white blood cell count: Secondary | ICD-10-CM | POA: Diagnosis present

## 2023-04-28 DIAGNOSIS — R451 Restlessness and agitation: Secondary | ICD-10-CM | POA: Diagnosis not present

## 2023-04-28 DIAGNOSIS — R739 Hyperglycemia, unspecified: Secondary | ICD-10-CM | POA: Diagnosis not present

## 2023-04-28 DIAGNOSIS — G4700 Insomnia, unspecified: Secondary | ICD-10-CM | POA: Diagnosis present

## 2023-04-28 DIAGNOSIS — Z952 Presence of prosthetic heart valve: Secondary | ICD-10-CM

## 2023-04-28 DIAGNOSIS — D696 Thrombocytopenia, unspecified: Secondary | ICD-10-CM | POA: Diagnosis not present

## 2023-04-28 DIAGNOSIS — I493 Ventricular premature depolarization: Secondary | ICD-10-CM | POA: Diagnosis not present

## 2023-04-28 DIAGNOSIS — Z79899 Other long term (current) drug therapy: Secondary | ICD-10-CM

## 2023-04-28 DIAGNOSIS — E785 Hyperlipidemia, unspecified: Secondary | ICD-10-CM | POA: Diagnosis present

## 2023-04-28 DIAGNOSIS — I10 Essential (primary) hypertension: Secondary | ICD-10-CM

## 2023-04-28 DIAGNOSIS — Z8249 Family history of ischemic heart disease and other diseases of the circulatory system: Secondary | ICD-10-CM

## 2023-04-28 DIAGNOSIS — F17290 Nicotine dependence, other tobacco product, uncomplicated: Secondary | ICD-10-CM | POA: Diagnosis present

## 2023-04-28 DIAGNOSIS — Z823 Family history of stroke: Secondary | ICD-10-CM

## 2023-04-28 DIAGNOSIS — Z8673 Personal history of transient ischemic attack (TIA), and cerebral infarction without residual deficits: Secondary | ICD-10-CM

## 2023-04-28 DIAGNOSIS — Z955 Presence of coronary angioplasty implant and graft: Secondary | ICD-10-CM | POA: Diagnosis not present

## 2023-04-28 DIAGNOSIS — F17210 Nicotine dependence, cigarettes, uncomplicated: Secondary | ICD-10-CM

## 2023-04-28 DIAGNOSIS — Z7902 Long term (current) use of antithrombotics/antiplatelets: Secondary | ICD-10-CM

## 2023-04-28 DIAGNOSIS — I5022 Chronic systolic (congestive) heart failure: Secondary | ICD-10-CM | POA: Diagnosis present

## 2023-04-28 DIAGNOSIS — I429 Cardiomyopathy, unspecified: Secondary | ICD-10-CM | POA: Diagnosis present

## 2023-04-28 DIAGNOSIS — I2510 Atherosclerotic heart disease of native coronary artery without angina pectoris: Secondary | ICD-10-CM

## 2023-04-28 DIAGNOSIS — I351 Nonrheumatic aortic (valve) insufficiency: Secondary | ICD-10-CM | POA: Diagnosis not present

## 2023-04-28 DIAGNOSIS — I252 Old myocardial infarction: Secondary | ICD-10-CM

## 2023-04-28 DIAGNOSIS — Z7984 Long term (current) use of oral hypoglycemic drugs: Secondary | ICD-10-CM

## 2023-04-28 DIAGNOSIS — F1120 Opioid dependence, uncomplicated: Secondary | ICD-10-CM | POA: Diagnosis present

## 2023-04-28 DIAGNOSIS — E871 Hypo-osmolality and hyponatremia: Secondary | ICD-10-CM | POA: Diagnosis present

## 2023-04-28 DIAGNOSIS — F1111 Opioid abuse, in remission: Secondary | ICD-10-CM | POA: Diagnosis not present

## 2023-04-28 DIAGNOSIS — I440 Atrioventricular block, first degree: Secondary | ICD-10-CM | POA: Diagnosis present

## 2023-04-28 HISTORY — PX: INTRAOPERATIVE TRANSESOPHAGEAL ECHOCARDIOGRAM: SHX5062

## 2023-04-28 HISTORY — DX: Atherosclerotic heart disease of native coronary artery without angina pectoris: I25.10

## 2023-04-28 HISTORY — PX: AORTIC VALVE REPLACEMENT: SHX41

## 2023-04-28 LAB — PLATELET COUNT: Platelets: 156 10*3/uL (ref 150–400)

## 2023-04-28 LAB — POCT I-STAT 7, (LYTES, BLD GAS, ICA,H+H)
Acid-Base Excess: 1 mmol/L (ref 0.0–2.0)
Acid-Base Excess: 1 mmol/L (ref 0.0–2.0)
Acid-base deficit: 2 mmol/L (ref 0.0–2.0)
Acid-base deficit: 1 mmol/L (ref 0.0–2.0)
Acid-base deficit: 2 mmol/L (ref 0.0–2.0)
Acid-base deficit: 2 mmol/L (ref 0.0–2.0)
Bicarbonate: 23.9 mmol/L (ref 20.0–28.0)
Bicarbonate: 23.2 mmol/L (ref 20.0–28.0)
Bicarbonate: 25.3 mmol/L (ref 20.0–28.0)
Bicarbonate: 25.8 mmol/L (ref 20.0–28.0)
Bicarbonate: 22.8 mmol/L (ref 20.0–28.0)
Bicarbonate: 23.2 mmol/L (ref 20.0–28.0)
Calcium, Ion: 1.22 mmol/L (ref 1.15–1.40)
Calcium, Ion: 0.97 mmol/L — ABNORMAL LOW (ref 1.15–1.40)
Calcium, Ion: 1.02 mmol/L — ABNORMAL LOW (ref 1.15–1.40)
Calcium, Ion: 1.09 mmol/L — ABNORMAL LOW (ref 1.15–1.40)
Calcium, Ion: 1.11 mmol/L — ABNORMAL LOW (ref 1.15–1.40)
Calcium, Ion: 1.11 mmol/L — ABNORMAL LOW (ref 1.15–1.40)
HCT: 38 % (ref 36.0–46.0)
HCT: 27 % — ABNORMAL LOW (ref 36.0–46.0)
HCT: 26 % — ABNORMAL LOW (ref 36.0–46.0)
HCT: 26 % — ABNORMAL LOW (ref 36.0–46.0)
HCT: 34 % — ABNORMAL LOW (ref 36.0–46.0)
HCT: 33 % — ABNORMAL LOW (ref 36.0–46.0)
Hemoglobin: 12.9 g/dL (ref 12.0–15.0)
Hemoglobin: 9.2 g/dL — ABNORMAL LOW (ref 12.0–15.0)
Hemoglobin: 8.8 g/dL — ABNORMAL LOW (ref 12.0–15.0)
Hemoglobin: 8.8 g/dL — ABNORMAL LOW (ref 12.0–15.0)
Hemoglobin: 11.6 g/dL — ABNORMAL LOW (ref 12.0–15.0)
Hemoglobin: 11.2 g/dL — ABNORMAL LOW (ref 12.0–15.0)
O2 Saturation: 100 %
O2 Saturation: 100 %
O2 Saturation: 100 %
O2 Saturation: 100 %
O2 Saturation: 100 %
O2 Saturation: 100 %
Patient temperature: 37.5
Patient temperature: 37.9
Potassium: 3.9 mmol/L (ref 3.5–5.1)
Potassium: 4.1 mmol/L (ref 3.5–5.1)
Potassium: 5.1 mmol/L (ref 3.5–5.1)
Potassium: 5.7 mmol/L — ABNORMAL HIGH (ref 3.5–5.1)
Potassium: 4.5 mmol/L (ref 3.5–5.1)
Potassium: 4.6 mmol/L (ref 3.5–5.1)
Sodium: 141 mmol/L (ref 135–145)
Sodium: 140 mmol/L (ref 135–145)
Sodium: 136 mmol/L (ref 135–145)
Sodium: 137 mmol/L (ref 135–145)
Sodium: 138 mmol/L (ref 135–145)
Sodium: 136 mmol/L (ref 135–145)
TCO2: 25 mmol/L (ref 22–32)
TCO2: 24 mmol/L (ref 22–32)
TCO2: 27 mmol/L (ref 22–32)
TCO2: 27 mmol/L (ref 22–32)
TCO2: 24 mmol/L (ref 22–32)
TCO2: 24 mmol/L (ref 22–32)
pCO2 arterial: 42.6 mmHg (ref 32–48)
pCO2 arterial: 35.4 mmHg (ref 32–48)
pCO2 arterial: 40 mmHg (ref 32–48)
pCO2 arterial: 40.6 mmHg (ref 32–48)
pCO2 arterial: 39 mmHg (ref 32–48)
pCO2 arterial: 40.4 mmHg (ref 32–48)
pH, Arterial: 7.357 (ref 7.35–7.45)
pH, Arterial: 7.426 (ref 7.35–7.45)
pH, Arterial: 7.403 (ref 7.35–7.45)
pH, Arterial: 7.418 (ref 7.35–7.45)
pH, Arterial: 7.376 (ref 7.35–7.45)
pH, Arterial: 7.371 (ref 7.35–7.45)
pO2, Arterial: 576 mmHg — ABNORMAL HIGH (ref 83–108)
pO2, Arterial: 431 mmHg — ABNORMAL HIGH (ref 83–108)
pO2, Arterial: 275 mmHg — ABNORMAL HIGH (ref 83–108)
pO2, Arterial: 374 mmHg — ABNORMAL HIGH (ref 83–108)
pO2, Arterial: 179 mmHg — ABNORMAL HIGH (ref 83–108)
pO2, Arterial: 199 mmHg — ABNORMAL HIGH (ref 83–108)

## 2023-04-28 LAB — PROTIME-INR
INR: 1.5 — ABNORMAL HIGH (ref 0.8–1.2)
Prothrombin Time: 18.3 s — ABNORMAL HIGH (ref 11.4–15.2)

## 2023-04-28 LAB — GLUCOSE, CAPILLARY
Glucose-Capillary: 111 mg/dL — ABNORMAL HIGH (ref 70–99)
Glucose-Capillary: 128 mg/dL — ABNORMAL HIGH (ref 70–99)
Glucose-Capillary: 136 mg/dL — ABNORMAL HIGH (ref 70–99)
Glucose-Capillary: 106 mg/dL — ABNORMAL HIGH (ref 70–99)
Glucose-Capillary: 99 mg/dL (ref 70–99)
Glucose-Capillary: 144 mg/dL — ABNORMAL HIGH (ref 70–99)
Glucose-Capillary: 142 mg/dL — ABNORMAL HIGH (ref 70–99)
Glucose-Capillary: 132 mg/dL — ABNORMAL HIGH (ref 70–99)

## 2023-04-28 LAB — POCT I-STAT, CHEM 8
BUN: 16 mg/dL (ref 6–20)
BUN: 14 mg/dL (ref 6–20)
BUN: 16 mg/dL (ref 6–20)
BUN: 16 mg/dL (ref 6–20)
Calcium, Ion: 1.23 mmol/L (ref 1.15–1.40)
Calcium, Ion: 1.07 mmol/L — ABNORMAL LOW (ref 1.15–1.40)
Calcium, Ion: 1.21 mmol/L (ref 1.15–1.40)
Calcium, Ion: 1.02 mmol/L — ABNORMAL LOW (ref 1.15–1.40)
Chloride: 104 mmol/L (ref 98–111)
Chloride: 102 mmol/L (ref 98–111)
Chloride: 104 mmol/L (ref 98–111)
Chloride: 102 mmol/L (ref 98–111)
Creatinine, Ser: 0.8 mg/dL (ref 0.44–1.00)
Creatinine, Ser: 0.8 mg/dL (ref 0.44–1.00)
Creatinine, Ser: 0.8 mg/dL (ref 0.44–1.00)
Creatinine, Ser: 0.8 mg/dL (ref 0.44–1.00)
Glucose, Bld: 105 mg/dL — ABNORMAL HIGH (ref 70–99)
Glucose, Bld: 134 mg/dL — ABNORMAL HIGH (ref 70–99)
Glucose, Bld: 157 mg/dL — ABNORMAL HIGH (ref 70–99)
Glucose, Bld: 200 mg/dL — ABNORMAL HIGH (ref 70–99)
HCT: 36 % (ref 36.0–46.0)
HCT: 26 % — ABNORMAL LOW (ref 36.0–46.0)
HCT: 32 % — ABNORMAL LOW (ref 36.0–46.0)
HCT: 26 % — ABNORMAL LOW (ref 36.0–46.0)
Hemoglobin: 12.2 g/dL (ref 12.0–15.0)
Hemoglobin: 10.9 g/dL — ABNORMAL LOW (ref 12.0–15.0)
Hemoglobin: 8.8 g/dL — ABNORMAL LOW (ref 12.0–15.0)
Hemoglobin: 8.8 g/dL — ABNORMAL LOW (ref 12.0–15.0)
Potassium: 4 mmol/L (ref 3.5–5.1)
Potassium: 4 mmol/L (ref 3.5–5.1)
Potassium: 5 mmol/L (ref 3.5–5.1)
Potassium: 5.7 mmol/L — ABNORMAL HIGH (ref 3.5–5.1)
Sodium: 141 mmol/L (ref 135–145)
Sodium: 137 mmol/L (ref 135–145)
Sodium: 138 mmol/L (ref 135–145)
Sodium: 136 mmol/L (ref 135–145)
TCO2: 28 mmol/L (ref 22–32)
TCO2: 26 mmol/L (ref 22–32)
TCO2: 27 mmol/L (ref 22–32)
TCO2: 27 mmol/L (ref 22–32)

## 2023-04-28 LAB — BASIC METABOLIC PANEL WITH GFR
Anion gap: 9 (ref 5–15)
BUN: 13 mg/dL (ref 6–20)
CO2: 21 mmol/L — ABNORMAL LOW (ref 22–32)
Calcium: 7.6 mg/dL — ABNORMAL LOW (ref 8.9–10.3)
Chloride: 103 mmol/L (ref 98–111)
Creatinine, Ser: 0.76 mg/dL (ref 0.44–1.00)
GFR, Estimated: >60 mL/min (ref >60)
Glucose, Bld: 111 mg/dL — ABNORMAL HIGH (ref 70–99)
Potassium: 5.4 mmol/L — ABNORMAL HIGH (ref 3.5–5.1)
Sodium: 133 mmol/L — ABNORMAL LOW (ref 135–145)

## 2023-04-28 LAB — POCT I-STAT EG7
Acid-Base Excess: 6 mmol/L — ABNORMAL HIGH (ref 0.0–2.0)
Acid-base deficit: 2 mmol/L (ref 0.0–2.0)
Bicarbonate: 30 mmol/L — ABNORMAL HIGH (ref 20.0–28.0)
Bicarbonate: 23 mmol/L (ref 20.0–28.0)
Calcium, Ion: 1 mmol/L — ABNORMAL LOW (ref 1.15–1.40)
Calcium, Ion: 1.12 mmol/L — ABNORMAL LOW (ref 1.15–1.40)
HCT: 25 % — ABNORMAL LOW (ref 36.0–46.0)
HCT: 35 % — ABNORMAL LOW (ref 36.0–46.0)
Hemoglobin: 8.5 g/dL — ABNORMAL LOW (ref 12.0–15.0)
Hemoglobin: 11.9 g/dL — ABNORMAL LOW (ref 12.0–15.0)
O2 Saturation: 84 %
O2 Saturation: 100 %
Patient temperature: 36.6
Potassium: 5.1 mmol/L (ref 3.5–5.1)
Potassium: 3.9 mmol/L (ref 3.5–5.1)
Sodium: 141 mmol/L (ref 135–145)
Sodium: 139 mmol/L (ref 135–145)
TCO2: 31 mmol/L (ref 22–32)
TCO2: 24 mmol/L (ref 22–32)
pCO2, Ven: 41.9 mmHg — ABNORMAL LOW (ref 44–60)
pCO2, Ven: 40.3 mmHg — ABNORMAL LOW (ref 44–60)
pH, Ven: 7.463 — ABNORMAL HIGH (ref 7.25–7.43)
pH, Ven: 7.363 (ref 7.25–7.43)
pO2, Ven: 46 mmHg — ABNORMAL HIGH (ref 32–45)
pO2, Ven: 214 mmHg — ABNORMAL HIGH (ref 32–45)

## 2023-04-28 LAB — CBC
HCT: 32.9 % — ABNORMAL LOW (ref 36.0–46.0)
HCT: 32.4 % — ABNORMAL LOW (ref 36.0–46.0)
Hemoglobin: 12 g/dL (ref 12.0–15.0)
Hemoglobin: 12 g/dL (ref 12.0–15.0)
MCH: 30.3 pg (ref 26.0–34.0)
MCH: 30.8 pg (ref 26.0–34.0)
MCHC: 36.5 g/dL — ABNORMAL HIGH (ref 30.0–36.0)
MCHC: 37 g/dL — ABNORMAL HIGH (ref 30.0–36.0)
MCV: 83.1 fL (ref 80.0–100.0)
MCV: 83.1 fL (ref 80.0–100.0)
Platelets: 171 10*3/uL (ref 150–400)
Platelets: 175 10*3/uL (ref 150–400)
RBC: 3.96 MIL/uL (ref 3.87–5.11)
RBC: 3.9 MIL/uL (ref 3.87–5.11)
RDW: 12.8 % (ref 11.5–15.5)
RDW: 12.9 % (ref 11.5–15.5)
WBC: 15.1 10*3/uL — ABNORMAL HIGH (ref 4.0–10.5)
WBC: 19.4 10*3/uL — ABNORMAL HIGH (ref 4.0–10.5)
nRBC: 0 % (ref 0.0–0.2)
nRBC: 0 % (ref 0.0–0.2)

## 2023-04-28 LAB — MAGNESIUM: Magnesium: 3.3 mg/dL — ABNORMAL HIGH (ref 1.7–2.4)

## 2023-04-28 LAB — HEMOGLOBIN AND HEMATOCRIT, BLOOD
HCT: 25.7 % — ABNORMAL LOW (ref 36.0–46.0)
Hemoglobin: 9.4 g/dL — ABNORMAL LOW (ref 12.0–15.0)

## 2023-04-28 LAB — APTT: aPTT: 34 s (ref 24–36)

## 2023-04-28 LAB — ABO/RH: ABO/RH(D): O POS

## 2023-04-28 SURGERY — REPLACEMENT, AORTIC VALVE, OPEN
Anesthesia: General | Site: Chest

## 2023-04-28 MED ORDER — ORAL CARE MOUTH RINSE: 15.0000 mL | OROMUCOSAL | Status: DC | PRN

## 2023-04-28 MED ORDER — FENTANYL CITRATE (PF) 250 MCG/5ML IJ SOLN
INTRAMUSCULAR | Status: AC
  Filled 2023-04-28: qty 5

## 2023-04-28 MED ORDER — DOCUSATE SODIUM 100 MG PO CAPS
200.0000 mg | ORAL_CAPSULE | Freq: Every day | ORAL | Status: DC
  Administered 2023-04-29 – 2023-05-03 (×3): 200 mg via ORAL
  Filled 2023-04-28 (×4): qty 2

## 2023-04-28 MED ORDER — MORPHINE SULFATE (PF) 2 MG/ML IV SOLN
1.0000 mg | INTRAVENOUS | Status: DC | PRN
  Administered 2023-04-28 – 2023-04-29 (×6): 4 mg via INTRAVENOUS
  Administered 2023-04-30: 2 mg via INTRAVENOUS
  Filled 2023-04-28 (×8): qty 2
  Filled 2023-04-28: qty 1
  Filled 2023-04-28: qty 2

## 2023-04-28 MED ORDER — MIDAZOLAM HCL (PF) 5 MG/ML IJ SOLN
INTRAMUSCULAR | Status: DC | PRN
  Administered 2023-04-28 (×3): 2 mg via INTRAVENOUS

## 2023-04-28 MED ORDER — AMIODARONE HCL 200 MG PO TABS
400.0000 mg | ORAL_TABLET | Freq: Two times a day (BID) | ORAL | Status: DC
  Administered 2023-04-29 – 2023-05-03 (×8): 400 mg via ORAL
  Filled 2023-04-28 (×9): qty 2

## 2023-04-28 MED ORDER — BISACODYL 10 MG RE SUPP: 10.0000 mg | Freq: Every day | RECTAL | Status: DC

## 2023-04-28 MED ORDER — LACTATED RINGERS IV SOLN: INTRAVENOUS | Status: DC | PRN

## 2023-04-28 MED ORDER — DEXMEDETOMIDINE HCL IN NACL 400 MCG/100ML IV SOLN
0.0000 ug/kg/h | INTRAVENOUS | Status: DC
  Administered 2023-04-28: 0.4 ug/kg/h via INTRAVENOUS
  Filled 2023-04-28: qty 100

## 2023-04-28 MED ORDER — CLEVIDIPINE BUTYRATE 0.5 MG/ML IV EMUL
INTRAVENOUS | Status: DC | PRN
  Administered 2023-04-28: 2 mg/h via INTRAVENOUS

## 2023-04-28 MED ORDER — SODIUM CHLORIDE 0.9% FLUSH
3.0000 mL | Freq: Two times a day (BID) | INTRAVENOUS | Status: DC
  Administered 2023-04-28: 10 mL via INTRAVENOUS

## 2023-04-28 MED ORDER — PROPOFOL 10 MG/ML IV BOLUS
INTRAVENOUS | Status: DC | PRN
  Administered 2023-04-28: 40 mg via INTRAVENOUS
  Administered 2023-04-28: 25 mg via INTRAVENOUS
  Administered 2023-04-28: 60 mg via INTRAVENOUS
  Administered 2023-04-28: 50 mg via INTRAVENOUS
  Administered 2023-04-28: 25 mg via INTRAVENOUS

## 2023-04-28 MED ORDER — HYDROMORPHONE HCL 1 MG/ML IJ SOLN
1.0000 mg | Freq: Once | INTRAMUSCULAR | Status: AC
  Administered 2023-04-28: 1 mg via INTRAVENOUS
  Filled 2023-04-28: qty 1

## 2023-04-28 MED ORDER — CEFAZOLIN SODIUM-DEXTROSE 2-4 GM/100ML-% IV SOLN
2.0000 g | Freq: Three times a day (TID) | INTRAVENOUS | Status: AC
  Administered 2023-04-28 – 2023-04-30 (×6): 2 g via INTRAVENOUS
  Filled 2023-04-28 (×6): qty 100

## 2023-04-28 MED ORDER — SODIUM CHLORIDE 0.9% FLUSH
3.0000 mL | Freq: Two times a day (BID) | INTRAVENOUS | Status: DC
  Administered 2023-04-30 – 2023-05-03 (×7): 3 mL via INTRAVENOUS

## 2023-04-28 MED ORDER — 0.9 % SODIUM CHLORIDE (POUR BTL) OPTIME
TOPICAL | Status: DC | PRN
  Administered 2023-04-28: 5000 mL

## 2023-04-28 MED ORDER — ACETAMINOPHEN 500 MG PO TABS
1000.0000 mg | ORAL_TABLET | Freq: Four times a day (QID) | ORAL | Status: DC
Start: 2023-04-29 — End: 2023-05-03
  Administered 2023-04-29 – 2023-05-03 (×16): 1000 mg via ORAL
  Filled 2023-04-28 (×17): qty 2

## 2023-04-28 MED ORDER — ~~LOC~~ CARDIAC SURGERY, PATIENT & FAMILY EDUCATION
Freq: Once | Status: DC
Start: 2023-04-28 — End: 2023-04-28
  Filled 2023-04-28: qty 1

## 2023-04-28 MED ORDER — PROTAMINE SULFATE 10 MG/ML IV SOLN
INTRAVENOUS | Status: DC | PRN
  Administered 2023-04-28: 20 mg via INTRAVENOUS
  Administered 2023-04-28: 240 mg via INTRAVENOUS

## 2023-04-28 MED ORDER — CHLORHEXIDINE GLUCONATE 0.12 % MT SOLN
15.0000 mL | OROMUCOSAL | Status: AC
  Administered 2023-04-28: 15 mL via OROMUCOSAL
  Filled 2023-04-28: qty 15

## 2023-04-28 MED ORDER — DEXTROSE 50 % IV SOLN: 0.0000 mL | INTRAVENOUS | Status: DC | PRN

## 2023-04-28 MED ORDER — PANTOPRAZOLE SODIUM 40 MG IV SOLR
40.0000 mg | Freq: Every day | INTRAVENOUS | Status: AC
  Administered 2023-04-28 – 2023-04-29 (×2): 40 mg via INTRAVENOUS
  Filled 2023-04-28 (×2): qty 10

## 2023-04-28 MED ORDER — SODIUM CHLORIDE 0.45 % IV SOLN: INTRAVENOUS | Status: AC

## 2023-04-28 MED ORDER — SODIUM CHLORIDE 0.9% FLUSH: 3.0000 mL | Freq: Two times a day (BID) | INTRAVENOUS | Status: DC

## 2023-04-28 MED ORDER — VANCOMYCIN HCL 1000 MG IV SOLR: INTRAVENOUS | Status: DC | PRN

## 2023-04-28 MED ORDER — PROPOFOL 10 MG/ML IV BOLUS
INTRAVENOUS | Status: AC
  Filled 2023-04-28: qty 20

## 2023-04-28 MED ORDER — METOPROLOL TARTRATE 5 MG/5ML IV SOLN
INTRAVENOUS | Status: DC | PRN
  Administered 2023-04-28: 2.5 mg via INTRAVENOUS
  Administered 2023-04-28: 1 mg via INTRAVENOUS
  Administered 2023-04-28: 1.5 mg via INTRAVENOUS

## 2023-04-28 MED ORDER — ALBUMIN HUMAN 5 % IV SOLN: INTRAVENOUS | Status: DC | PRN

## 2023-04-28 MED ORDER — ASPIRIN 325 MG PO TBEC
325.0000 mg | DELAYED_RELEASE_TABLET | Freq: Every day | ORAL | Status: DC
  Administered 2023-04-29 – 2023-05-01 (×3): 325 mg via ORAL
  Filled 2023-04-28 (×3): qty 1

## 2023-04-28 MED ORDER — MIDAZOLAM HCL 2 MG/2ML IJ SOLN
2.0000 mg | INTRAMUSCULAR | Status: DC | PRN
  Administered 2023-04-28 (×2): 2 mg via INTRAVENOUS
  Filled 2023-04-28 (×3): qty 2

## 2023-04-28 MED ORDER — METOCLOPRAMIDE HCL 5 MG/ML IJ SOLN
10.0000 mg | Freq: Four times a day (QID) | INTRAMUSCULAR | Status: AC
  Administered 2023-04-28 – 2023-04-29 (×6): 10 mg via INTRAVENOUS
  Filled 2023-04-28 (×6): qty 2

## 2023-04-28 MED ORDER — SODIUM CHLORIDE 0.9% FLUSH
10.0000 mL | Freq: Two times a day (BID) | INTRAVENOUS | Status: DC
  Administered 2023-04-28 – 2023-04-30 (×3): 10 mL

## 2023-04-28 MED ORDER — NITROGLYCERIN 0.2 MG/ML ON CALL CATH LAB
INTRAVENOUS | Status: DC | PRN
  Administered 2023-04-28: 50 ug via INTRAVENOUS
  Administered 2023-04-28: 30 ug via INTRAVENOUS
  Administered 2023-04-28: 20 ug via INTRAVENOUS
  Administered 2023-04-28: 40 ug via INTRAVENOUS
  Administered 2023-04-28 (×2): 30 ug via INTRAVENOUS
  Administered 2023-04-28: 20 ug via INTRAVENOUS
  Administered 2023-04-28: 30 ug via INTRAVENOUS
  Administered 2023-04-28: 50 ug via INTRAVENOUS

## 2023-04-28 MED ORDER — SODIUM CHLORIDE 0.9% FLUSH: 3.0000 mL | INTRAVENOUS | Status: DC | PRN

## 2023-04-28 MED ORDER — TRAMADOL HCL 50 MG PO TABS
50.0000 mg | ORAL_TABLET | ORAL | Status: DC | PRN
  Administered 2023-04-28: 100 mg via ORAL
  Filled 2023-04-28: qty 2

## 2023-04-28 MED ORDER — PLASMA-LYTE A IV SOLN: INTRAVENOUS | Status: DC | PRN

## 2023-04-28 MED ORDER — BISACODYL 5 MG PO TBEC
10.0000 mg | DELAYED_RELEASE_TABLET | Freq: Every day | ORAL | Status: DC
  Administered 2023-04-29 – 2023-05-02 (×2): 10 mg via ORAL
  Filled 2023-04-28 (×4): qty 2

## 2023-04-28 MED ORDER — PHENYLEPHRINE HCL-NACL 20-0.9 MG/250ML-% IV SOLN
0.0000 ug/min | INTRAVENOUS | Status: DC
Start: 2023-04-28 — End: 2023-04-29

## 2023-04-28 MED ORDER — LACTATED RINGERS IV SOLN: INTRAVENOUS | Status: AC

## 2023-04-28 MED ORDER — ROCURONIUM BROMIDE 10 MG/ML (PF) SYRINGE
PREFILLED_SYRINGE | INTRAVENOUS | Status: AC
  Filled 2023-04-28: qty 10

## 2023-04-28 MED ORDER — PHENYLEPHRINE 80 MCG/ML (10ML) SYRINGE FOR IV PUSH (FOR BLOOD PRESSURE SUPPORT)
PREFILLED_SYRINGE | INTRAVENOUS | Status: DC | PRN
  Administered 2023-04-28 (×2): 40 ug via INTRAVENOUS
  Administered 2023-04-28 (×2): 80 ug via INTRAVENOUS

## 2023-04-28 MED ORDER — EPINEPHRINE HCL 5 MG/250ML IV SOLN IN NS: 0.0000 ug/min | INTRAVENOUS | Status: DC

## 2023-04-28 MED ORDER — EZETIMIBE 10 MG PO TABS
10.0000 mg | ORAL_TABLET | Freq: Every day | ORAL | Status: DC
  Administered 2023-04-29 – 2023-05-03 (×5): 10 mg via ORAL
  Filled 2023-04-28 (×5): qty 1

## 2023-04-28 MED ORDER — ROCURONIUM BROMIDE 10 MG/ML (PF) SYRINGE
PREFILLED_SYRINGE | INTRAVENOUS | Status: DC | PRN
Start: 2023-04-28 — End: 2023-04-28
  Administered 2023-04-28: 70 mg via INTRAVENOUS
  Administered 2023-04-28 (×2): 30 mg via INTRAVENOUS

## 2023-04-28 MED ORDER — ALBUMIN HUMAN 5 % IV SOLN
250.0000 mL | INTRAVENOUS | Status: DC | PRN
  Filled 2023-04-28: qty 250

## 2023-04-28 MED ORDER — ACETAMINOPHEN 160 MG/5ML PO SOLN: 650.0000 mg | Freq: Once | ORAL | Status: DC

## 2023-04-28 MED ORDER — ORAL CARE MOUTH RINSE: 15.0000 mL | OROMUCOSAL | Status: DC

## 2023-04-28 MED ORDER — METOPROLOL TARTRATE 5 MG/5ML IV SOLN
2.5000 mg | INTRAVENOUS | Status: DC | PRN
  Administered 2023-04-29 (×3): 5 mg via INTRAVENOUS
  Filled 2023-04-28 (×3): qty 5

## 2023-04-28 MED ORDER — ATORVASTATIN CALCIUM 40 MG PO TABS
40.0000 mg | ORAL_TABLET | Freq: Every day | ORAL | Status: DC
  Administered 2023-04-29 – 2023-05-02 (×4): 40 mg via ORAL
  Filled 2023-04-28 (×4): qty 1

## 2023-04-28 MED ORDER — HEPARIN SODIUM (PORCINE) 1000 UNIT/ML IJ SOLN
INTRAMUSCULAR | Status: AC
  Filled 2023-04-28: qty 1

## 2023-04-28 MED ORDER — POTASSIUM CHLORIDE 10 MEQ/50ML IV SOLN
10.0000 meq | INTRAVENOUS | Status: AC
  Administered 2023-04-28 (×3): 10 meq via INTRAVENOUS

## 2023-04-28 MED ORDER — CHLORHEXIDINE GLUCONATE CLOTH 2 % EX PADS
6.0000 | MEDICATED_PAD | Freq: Every day | CUTANEOUS | Status: DC
  Administered 2023-04-28 – 2023-05-03 (×6): 6 via TOPICAL

## 2023-04-28 MED ORDER — ONDANSETRON HCL 4 MG/2ML IJ SOLN: 4.0000 mg | Freq: Four times a day (QID) | INTRAMUSCULAR | Status: DC | PRN

## 2023-04-28 MED ORDER — SUGAMMADEX SODIUM 200 MG/2ML IV SOLN
INTRAVENOUS | Status: DC | PRN
  Administered 2023-04-28: 200 mg via INTRAVENOUS

## 2023-04-28 MED ORDER — FENTANYL CITRATE (PF) 250 MCG/5ML IJ SOLN
INTRAMUSCULAR | Status: DC | PRN
  Administered 2023-04-28: 50 ug via INTRAVENOUS
  Administered 2023-04-28 (×3): 100 ug via INTRAVENOUS
  Administered 2023-04-28: 200 ug via INTRAVENOUS
  Administered 2023-04-28: 100 ug via INTRAVENOUS
  Administered 2023-04-28: 250 ug via INTRAVENOUS
  Administered 2023-04-28 (×2): 50 ug via INTRAVENOUS
  Administered 2023-04-28 (×2): 250 ug via INTRAVENOUS

## 2023-04-28 MED ORDER — METOPROLOL TARTRATE 12.5 MG HALF TABLET
12.5000 mg | ORAL_TABLET | Freq: Once | ORAL | Status: DC
Start: 2023-04-28 — End: 2023-04-28

## 2023-04-28 MED ORDER — MIDAZOLAM HCL (PF) 10 MG/2ML IJ SOLN
INTRAMUSCULAR | Status: AC
  Filled 2023-04-28: qty 2

## 2023-04-28 MED ORDER — OXYCODONE HCL 5 MG PO TABS
5.0000 mg | ORAL_TABLET | ORAL | Status: DC | PRN
  Filled 2023-04-28: qty 2

## 2023-04-28 MED ORDER — SODIUM CHLORIDE 0.9 % IV SOLN: 250.0000 mL | INTRAVENOUS | Status: AC

## 2023-04-28 MED ORDER — PHENYLEPHRINE 80 MCG/ML (10ML) SYRINGE FOR IV PUSH (FOR BLOOD PRESSURE SUPPORT)
PREFILLED_SYRINGE | INTRAVENOUS | Status: AC
  Filled 2023-04-28: qty 10

## 2023-04-28 MED ORDER — ASPIRIN 81 MG PO CHEW
324.0000 mg | CHEWABLE_TABLET | Freq: Once | ORAL | Status: AC
  Administered 2023-04-28: 324 mg via ORAL
  Filled 2023-04-28: qty 4

## 2023-04-28 MED ORDER — CHLORHEXIDINE GLUCONATE 0.12 % MT SOLN
15.0000 mL | Freq: Once | OROMUCOSAL | Status: AC
  Administered 2023-04-28: 15 mL via OROMUCOSAL
  Filled 2023-04-28: qty 15

## 2023-04-28 MED ORDER — NITROGLYCERIN IN D5W 200-5 MCG/ML-% IV SOLN: 0.0000 ug/min | INTRAVENOUS | Status: DC

## 2023-04-28 MED ORDER — CLEVIDIPINE BUTYRATE 0.5 MG/ML IV EMUL
0.0000 mg/h | INTRAVENOUS | Status: DC
  Administered 2023-04-28: 4 mg/h via INTRAVENOUS
  Administered 2023-04-28: 20 mg/h via INTRAVENOUS
  Administered 2023-04-29: 8 mg/h via INTRAVENOUS
  Administered 2023-04-29: 2 mg/h via INTRAVENOUS
  Administered 2023-04-29: 8 mg/h via INTRAVENOUS
  Administered 2023-04-29: 2 mg/h via INTRAVENOUS
  Filled 2023-04-28: qty 100
  Filled 2023-04-28: qty 200
  Filled 2023-04-28 (×2): qty 100

## 2023-04-28 MED ORDER — SODIUM CHLORIDE 0.9 % IV SOLN
INTRAVENOUS | Status: DC | PRN
Start: 2023-04-28 — End: 2023-04-28

## 2023-04-28 MED ORDER — PANTOPRAZOLE SODIUM 40 MG PO TBEC
40.0000 mg | DELAYED_RELEASE_TABLET | Freq: Every day | ORAL | Status: DC
  Administered 2023-04-30 – 2023-05-03 (×4): 40 mg via ORAL
  Filled 2023-04-28 (×4): qty 1

## 2023-04-28 MED ORDER — ACETAMINOPHEN 160 MG/5ML PO SOLN: 1000.0000 mg | Freq: Four times a day (QID) | ORAL | Status: DC

## 2023-04-28 MED ORDER — METOPROLOL TARTRATE 12.5 MG HALF TABLET
12.5000 mg | ORAL_TABLET | Freq: Two times a day (BID) | ORAL | Status: DC
  Administered 2023-04-28: 12.5 mg via ORAL
  Filled 2023-04-28: qty 1

## 2023-04-28 MED ORDER — SODIUM CHLORIDE 0.9% FLUSH
3.0000 mL | Freq: Two times a day (BID) | INTRAVENOUS | Status: DC
  Administered 2023-04-28: 10 mL via INTRAVENOUS
  Administered 2023-04-28: 3 mL via INTRAVENOUS
  Administered 2023-04-30: 10 mL via INTRAVENOUS

## 2023-04-28 MED ORDER — OXYCODONE HCL 5 MG PO TABS
10.0000 mg | ORAL_TABLET | ORAL | Status: DC | PRN
  Administered 2023-04-28 – 2023-05-02 (×10): 10 mg via ORAL
  Filled 2023-04-28 (×9): qty 2

## 2023-04-28 MED ORDER — ASPIRIN 81 MG PO CHEW: 324.0000 mg | CHEWABLE_TABLET | Freq: Every day | ORAL | Status: DC

## 2023-04-28 MED ORDER — DEXMEDETOMIDINE HCL IN NACL 400 MCG/100ML IV SOLN
0.0000 ug/kg/h | INTRAVENOUS | Status: DC
Start: 2023-04-28 — End: 2023-04-28
  Filled 2023-04-28: qty 100

## 2023-04-28 MED ORDER — CLEVIDIPINE BUTYRATE 0.5 MG/ML IV EMUL
INTRAVENOUS | Status: AC
  Filled 2023-04-28: qty 50

## 2023-04-28 MED ORDER — METOPROLOL TARTRATE 25 MG/10 ML ORAL SUSPENSION: 12.5000 mg | Freq: Two times a day (BID) | ORAL | Status: DC

## 2023-04-28 MED ORDER — CHLORHEXIDINE GLUCONATE 4 % EX SOLN: 30.0000 mL | CUTANEOUS | Status: DC

## 2023-04-28 MED ORDER — MORPHINE SULFATE (PF) 2 MG/ML IV SOLN
4.0000 mg | Freq: Once | INTRAVENOUS | Status: AC
  Administered 2023-04-28: 4 mg via INTRAVENOUS

## 2023-04-28 MED ORDER — INSULIN REGULAR(HUMAN) IN NACL 100-0.9 UT/100ML-% IV SOLN: INTRAVENOUS | Status: DC

## 2023-04-28 MED ORDER — LACTATED RINGERS IV SOLN
INTRAVENOUS | Status: DC | PRN
Start: 2023-04-28 — End: 2023-04-28

## 2023-04-28 MED ORDER — MAGNESIUM SULFATE 4 GM/100ML IV SOLN
4.0000 g | Freq: Once | INTRAVENOUS | Status: AC
  Administered 2023-04-28: 4 g via INTRAVENOUS
  Filled 2023-04-28: qty 100

## 2023-04-28 MED ORDER — PROTAMINE SULFATE 10 MG/ML IV SOLN
INTRAVENOUS | Status: AC
  Filled 2023-04-28: qty 25

## 2023-04-28 MED ORDER — HEPARIN SODIUM (PORCINE) 1000 UNIT/ML IJ SOLN
INTRAMUSCULAR | Status: DC | PRN
  Administered 2023-04-28: 26000 [IU] via INTRAVENOUS

## 2023-04-28 MED ORDER — SODIUM CHLORIDE 0.9% FLUSH: 10.0000 mL | INTRAVENOUS | Status: DC | PRN

## 2023-04-28 MED ORDER — VANCOMYCIN HCL IN DEXTROSE 1-5 GM/200ML-% IV SOLN
1000.0000 mg | Freq: Once | INTRAVENOUS | Status: AC
  Administered 2023-04-28: 1000 mg via INTRAVENOUS
  Filled 2023-04-28: qty 200

## 2023-04-28 SURGICAL SUPPLY — 62 items
ADAPTER CARDIO PERF ANTE/RETRO (ADAPTER) ×3 IMPLANT
ANTIFOG SOL W/FOAM PAD STRL (MISCELLANEOUS) ×2 IMPLANT
BAG DECANTER FOR FLEXI CONT (MISCELLANEOUS) ×3 IMPLANT
BLADE CLIPPER SURG (BLADE) ×3 IMPLANT
BLADE STERNUM SYSTEM 6 (BLADE) ×3 IMPLANT
CANISTER SUCT 3000ML PPV (MISCELLANEOUS) ×3 IMPLANT
CANNULA MC2 2 STG 36/46 CONN (CANNULA) IMPLANT
CANNULA NON VENT 20FR 12 (CANNULA) ×3 IMPLANT
CATH HEART VENT LEFT (CATHETERS) ×3 IMPLANT
CATH RETROPLEGIA CORONARY 14FR (CATHETERS) ×3 IMPLANT
CATH ROBINSON RED A/P 18FR (CATHETERS) ×9 IMPLANT
CATH THOR STR 32F SOFT 20 RADI (CATHETERS) ×3 IMPLANT
CATH THORACIC 28FR RT ANG (CATHETERS) ×3 IMPLANT
DEVICE SUT CK QUICK LOAD MINI (Prosthesis & Implant Heart) IMPLANT
DRSG AQUACEL AG ADV 3.5X10 (GAUZE/BANDAGES/DRESSINGS) ×3 IMPLANT
ELECT CAUTERY BLADE 6.4 (BLADE) ×3 IMPLANT
ELECT REM PT RETURN 9FT ADLT (ELECTROSURGICAL) ×4 IMPLANT
ELECTRODE REM PT RTRN 9FT ADLT (ELECTROSURGICAL) ×6 IMPLANT
FELT TEFLON 1X6 (MISCELLANEOUS) ×3 IMPLANT
GAUZE SPONGE 4X4 12PLY STRL (GAUZE/BANDAGES/DRESSINGS) ×6 IMPLANT
GOWN STRL REUS W/ TWL LRG LVL3 (GOWN DISPOSABLE) ×18 IMPLANT
GOWN STRL REUS W/TWL XL LVL3 (GOWN DISPOSABLE) IMPLANT
INSERT FOGARTY XLG (MISCELLANEOUS) ×3 IMPLANT
KIT BASIN OR (CUSTOM PROCEDURE TRAY) ×3 IMPLANT
KIT SUCTION CATH 14FR (SUCTIONS) ×3 IMPLANT
KIT SUT CK MINI COMBO 4X17 (Prosthesis & Implant Heart) IMPLANT
KIT TURNOVER KIT B (KITS) ×3 IMPLANT
LINE VENT (MISCELLANEOUS) IMPLANT
NS IRRIG 1000ML POUR BTL (IV SOLUTION) ×15 IMPLANT
ORGANIZER SUTURE GABBAY-FRATER (MISCELLANEOUS) ×3 IMPLANT
PACK E OPEN HEART (SUTURE) ×3 IMPLANT
PACK OPEN HEART (CUSTOM PROCEDURE TRAY) ×3 IMPLANT
PAD ARMBOARD POSITIONER FOAM (MISCELLANEOUS) ×6 IMPLANT
PAD ELECT DEFIB RADIOL ZOLL (MISCELLANEOUS) ×3 IMPLANT
PENCIL BUTTON HOLSTER BLD 10FT (ELECTRODE) ×3 IMPLANT
POSITIONER HEAD DONUT 9IN (MISCELLANEOUS) ×3 IMPLANT
PUNCH AORTIC ROTATE 4.5MM 8IN (MISCELLANEOUS) ×3 IMPLANT
SET MPS 3-ND DEL (MISCELLANEOUS) IMPLANT
SOLUTION ANTFG W/FOAM PAD STRL (MISCELLANEOUS) IMPLANT
SUT EB EXC GRN/WHT 2-0 V-5 (SUTURE) ×6 IMPLANT
SUT MNCRL AB 4-0 PS2 18 (SUTURE) ×6 IMPLANT
SUT PERMA SILK 0 CT1 (SUTURE) IMPLANT
SUT PROLENE 4 0 SH DA (SUTURE) ×3 IMPLANT
SUT PROLENE 4-0 RB1 .5 CRCL 36 (SUTURE) IMPLANT
SUT PROLENE 5 0 C 1 36 (SUTURE) IMPLANT
SUT PROLENE 6 0 C 1 30 (SUTURE) IMPLANT
SUT STEEL 6MS V (SUTURE) IMPLANT
SUT STEEL SZ 6 DBL 3X14 BALL (SUTURE) ×6 IMPLANT
SUT VIC AB 0 CTX36XBRD ANTBCTR (SUTURE) ×6 IMPLANT
SUT VIC AB 2-0 CT1 TAPERPNT 27 (SUTURE) ×6 IMPLANT
SYR BULB IRRIG 60ML STRL (SYRINGE) IMPLANT
SYSTEM SAHARA CHEST DRAIN ATS (WOUND CARE) ×3 IMPLANT
TAPE CLOTH SURG 4X10 WHT LF (GAUZE/BANDAGES/DRESSINGS) IMPLANT
TAPE PAPER 2X10 WHT MICROPORE (GAUZE/BANDAGES/DRESSINGS) IMPLANT
TOWEL GREEN STERILE (TOWEL DISPOSABLE) ×3 IMPLANT
TOWEL GREEN STERILE FF (TOWEL DISPOSABLE) ×3 IMPLANT
TRAY FOLEY SLVR 16FR TEMP STAT (SET/KITS/TRAYS/PACK) ×3 IMPLANT
TUBE CONNECTING 20X1/4 (TUBING) IMPLANT
UNDERPAD 30X36 HEAVY ABSORB (UNDERPADS AND DIAPERS) ×3 IMPLANT
VALVE AORTIC SZ21 INSP/RESIL (Valve) IMPLANT
VENT LEFT HEART 12002 (CATHETERS) ×2 IMPLANT
WATER STERILE IRR 1000ML POUR (IV SOLUTION) ×6 IMPLANT

## 2023-04-28 NOTE — Interval H&P Note (Signed)
 History and Physical Interval Note:  04/28/2023 6:26 AM  Leslie Walker  has presented today for surgery, with the diagnosis of SEVERE AI MODERATE AS.  The various methods of treatment have been discussed with the patient and family. After consideration of risks, benefits and other options for treatment, the patient has consented to  Procedure(s): REPLACEMENT, AORTIC VALVE, OPEN (N/A) ECHOCARDIOGRAM, TRANSESOPHAGEAL, INTRAOPERATIVE (N/A) as a surgical intervention.  The patient's history has been reviewed, patient examined, no change in status, stable for surgery.  I have reviewed the patient's chart and labs.  Questions were answered to the patient's satisfaction.     Eugenio Hoes

## 2023-04-28 NOTE — Anesthesia Postprocedure Evaluation (Signed)
 Anesthesia Post Note  Patient: Leslie Walker  Procedure(s) Performed: AORTIC VALVE REPLACEMENT USING INSPIRIS RESILIA AORTIC VALVE SIZE (Chest) ECHOCARDIOGRAM, TRANSESOPHAGEAL, INTRAOPERATIVE     Patient location during evaluation: SICU Anesthesia Type: General Level of consciousness: sedated Pain management: pain level controlled Vital Signs Assessment: post-procedure vital signs reviewed and stable Respiratory status: patient remains intubated per anesthesia plan Cardiovascular status: stable Postop Assessment: no apparent nausea or vomiting Anesthetic complications: no   No notable events documented.                Natarsha Hurwitz

## 2023-04-28 NOTE — Anesthesia Procedure Notes (Signed)
 Procedure Name: Intubation Date/Time: 04/28/2023 7:47 AM  Performed by: Randon Goldsmith, CRNAPre-anesthesia Checklist: Patient identified, Emergency Drugs available, Suction available and Patient being monitored Patient Re-evaluated:Patient Re-evaluated prior to induction Oxygen Delivery Method: Circle system utilized Preoxygenation: Pre-oxygenation with 100% oxygen Induction Type: IV induction Ventilation: Mask ventilation without difficulty Laryngoscope Size: Mac and 3 Grade View: Grade I Tube type: Oral Tube size: 8.5 mm Number of attempts: 1 Airway Equipment and Method: Stylet and Oral airway Placement Confirmation: ETT inserted through vocal cords under direct vision, positive ETCO2 and breath sounds checked- equal and bilateral Secured at: 23 cm Tube secured with: Tape Dental Injury: Teeth and Oropharynx as per pre-operative assessment  Comments: IStephan Minister SRNA

## 2023-04-28 NOTE — Procedures (Signed)
 Extubation Procedure Note  Patient Details:   Name: Leslie Walker DOB: November 27, 1965 MRN: [id number]   Airway Documentation:    Vent end date: 04/28/23 Vent end time: 1358   Evaluation  O2 sats: stable throughout Complications: No apparent complications Patient did tolerate procedure well. Bilateral Breath Sounds: Clear   Yes,  Per CCM order, RT extubated pt to [place]. Prior to extubation NIF= -30 VC= 436 with positive cuff leak. Pt tolerated well, no stridor noted and pt was able to state her name.  Hart Rochester R 04/28/2023, 2:00 PM

## 2023-04-28 NOTE — Discharge Instructions (Signed)
 Discharge Instructions:  1. You may shower, please wash incisions daily with soap and water and keep dry.  If you wish to cover wounds with dressing you may do so but please keep clean and change daily.  No tub baths or swimming until incisions have completely healed.  If your incisions become red or develop any drainage please call our office at [phone]  2. No Driving until cleared by Dr. Karolee Ohs office and you are no longer using narcotic pain medications  3. Monitor your weight daily.. Please use the same scale and weigh at same time... If you gain 5-10 lbs in 48 hours with associated lower extremity swelling, please contact our office at [phone]  4. Fever of 101.5 for at least 24 hours with no source, please contact our office at [phone]  5. Activity- up as tolerated, please walk at least 3 times per day.  Avoid strenuous activity, no lifting, pushing, or pulling with your arms over 8-10 lbs for a minimum of 6 weeks  6. If any questions or concerns arise, please do not hesitate to contact our office at [phone]

## 2023-04-28 NOTE — Consult Note (Signed)
 Leslie Walker, MRN:  [id number], DOB:  1965/05/23, LOS: 0 ADMISSION DATE:  04/28/2023, CONSULTATION DATE:  04/28/23 REFERRING MD:  MD Leafy Ro  CHIEF COMPLAINT: Increased fatigue  History of Present Illness:  Patient is a 58 year old female with significant past medical history hypertension, with chronic systolic heart failure, severe coronary artery disease status post stenting on Plavix and cardiomyopathy with less than 20% along with severe aortic insufficiency and heroin/cocaine abuse disorder.  Patient's most recent echo revealed an EF of 55% with moderate aortic stenosis and severe aortic insufficiency with medical management.  She had been in her usual state of health but began developing increased fatigue and was recently seen at her advanced heart failure clinic where the recommendations were made for for cath on 3/19 and possible SAVR.  Patient underwent aortic valve replacement for severe aortic insufficiency and moderate aortic stenosis.  PCCM consulted for postop vent management as well as assist with medical care and while in ICU.  Past Medical History:  Diagnosis Date   Chronic systolic heart failure (HCC)    Cocaine abuse (HCC)    last use about 1 year ago (04/26/23)   Coronary artery disease    Dyslipidemia    Hypertension    Noncompliance w/medication treatment due to intermit use of medication    Stroke (HCC) 2021   no deficits     Significant Hospital Events: Including procedures, antibiotic start and stop dates in addition to other pertinent events   4/11 severe aortic insufficiency and moderate aortic stenosis status post AVR, PCCM consulted  Interim History / Subjective:  Intubated sedated  Objective   Blood pressure (!) 145/83, pulse 77, temperature 98.3 F (36.8 C), temperature source Oral, resp. rate 18, height 5\' 4"  (1.626 m), weight 64.8 kg, SpO2 100%.        Intake/Output Summary (Last 24 hours) at 04/28/2023 1027 Last data filed at 04/28/2023  1023 Gross per 24 hour  Intake 1230 ml  Output 600 ml  Net 630 ml   Filed Weights   04/28/23 0609  Weight: 64.8 kg    Examination: General: acutely ill on chronic older adult female, lying on ICU bed on vent HENT: Normocephalic, PERRLA intact, ETT, OG in place, Pink MM  Lungs: clear, diminished, no distress, mediastinal  Cardiovascular: s1,s2, RRR, no MRG, No JVD  Abdomen: BS active, soft  Extremities: moves all extremities, to pain, warm  Neuro: RASS -2, responds to painful stimuli  GU: Foley, intact   Resolved Hospital Problem list   N/A  Assessment & Plan:  Severe aortic insufficiency and moderate aortic stenosis Status post AVR CAD s/p stent-on Plavix P:  Postoperative management per TCTS CT management per protocol Wean pressors for map goal >65 Postoperative ancef/vanc for surgical ppx Continue ASA and statin Continue amiodarone  Continue metoprolol  Wean insulin gtt per protocol Continue ancef for surgical prophylaxis  Wean off pressors as tolerated, MAP goal >65   Utilize cleviprex for HTN  Continue Pain Management oxycodone, tramadol, morphine PRN   Post op vent management P: Initiate Rapid wean protocol per TCTS Continue ventilator support with lung protective strategies  Wean PEEP and FiO2 for sats greater than 90%. Head of bed elevated 30 degrees. Plateau pressures less than 30 cm H20.  Follow intermittent chest x-ray and ABG.   Ensure adequate pulmonary hygiene  VAP bundle in place/PAD protocol Continue precedex- can extubate to precedex  Continue pain regimen as above   Hypertension Dyslipidemia P: Continue cleviprex to manage  HTN, SBP goal 120  Continue to monitor per aline  Continue Statin  Continue metoprolol   Expected post-operative ABLA Hgb 12.0  P: Continue to monitor h/h daily Monitor for signs of bleeding Transfuse for hgb <7   Polysubstance abuse disorder -Heroin/cocaine Per documentation snorts heroin 1-2 times a  week Possible last cocaine use greater than a year ago P: Start enteral pain management per tube  Oxycodone,  tramadol per tube, morphine prn q4  If patient's pain not controlled effectively may need to use ketamine gtt  Continue restraints of BL wrist restraints  Give 1 mg of IV diluadid    Best Practice (right click and "Reselect all SmartList Selections" daily)   Diet/type: NPO DVT prophylaxis SCD Pressure ulcer(s): N/A GI prophylaxis: PPI Lines: Central line Foley:  Yes, and it is still needed Code Status:  full code Last date of multidisciplinary goals of care discussion [per primary]   Labs   CBC: Recent Labs  Lab 04/26/23 1230 04/28/23 0800 04/28/23 0917 04/28/23 0930 04/28/23 0946 04/28/23 1014 04/28/23 1017  WBC 5.8  --   --   --   --   --   --   HGB 14.9   < > 8.8* 9.4* 8.8* 8.8* 8.8*  HCT 40.8   < > 26.0* 25.7* 26.0* 26.0* 26.0*  MCV 84.5  --   --   --   --   --   --   PLT 258  --   --  156  --   --   --    < > = values in this interval not displayed.    Basic Metabolic Panel: Recent Labs  Lab 04/26/23 1230 04/28/23 0800 04/28/23 0804 04/28/23 0834 04/28/23 0844 04/28/23 0900 04/28/23 0917 04/28/23 0946 04/28/23 1014 04/28/23 1017  NA 138 141   < > 138   < > 141 137 137 136 136  K 4.7 4.0   < > 4.0   < > 5.1 5.0 5.1 5.7* 5.7*  CL 102 104  --  104  --   --  102  --  102  --   CO2 29  --   --   --   --   --   --   --   --   --   GLUCOSE 107* 105*  --  134*  --   --  157*  --  200*  --   BUN 15 16  --  16  --   --  14  --  16  --   CREATININE 0.98 0.80  --  0.80  --   --  0.80  --  0.80  --   CALCIUM 9.9  --   --   --   --   --   --   --   --   --    < > = values in this interval not displayed.   GFR: Estimated Creatinine Clearance: 66.2 mL/min (by C-G formula based on SCr of 0.8 mg/dL). Recent Labs  Lab 04/26/23 1230  WBC 5.8    Liver Function Tests: Recent Labs  Lab 04/26/23 1230  AST 20  ALT 15  ALKPHOS 58  BILITOT 0.6  PROT  7.2  ALBUMIN 3.7   No results for input(s): "LIPASE", "AMYLASE" in the last 168 hours. No results for input(s): "AMMONIA" in the last 168 hours.  ABG    Component Value Date/Time   PHART 7.418 04/28/2023 1017   PCO2ART  40.0 04/28/2023 1017   PO2ART 275 (H) 04/28/2023 1017   HCO3 25.8 04/28/2023 1017   TCO2 27 04/28/2023 1017   ACIDBASEDEF 1.0 04/28/2023 0844   O2SAT 100 04/28/2023 1017     Coagulation Profile: Recent Labs  Lab 04/26/23 1230  INR 1.0    Cardiac Enzymes: No results for input(s): "CKTOTAL", "CKMB", "CKMBINDEX", "TROPONINI" in the last 168 hours.  HbA1C: Hgb A1c MFr Bld  Date/Time Value Ref Range Status  04/26/2023 12:30 PM 5.5 4.8 - 5.6 % Final    Comment:    (NOTE) Pre diabetes:          5.7%-6.4%  Diabetes:              >6.4%  Glycemic control for   <7.0% adults with diabetes   10/09/2020 01:29 PM 5.6 4.8 - 5.6 % Final    Comment:    (NOTE) Pre diabetes:          5.7%-6.4%  Diabetes:              >6.4%  Glycemic control for   <7.0% adults with diabetes     CBG: Recent Labs  Lab 04/26/23 1109  GLUCAP 142*    Review of Systems:   See HPI   Past Medical History:  She,  has a past medical history of Chronic systolic heart failure (HCC), Cocaine abuse (HCC), Coronary artery disease, Dyslipidemia, Hypertension, Noncompliance w/medication treatment due to intermit use of medication, and Stroke (HCC) (2021).   Surgical History:   Past Surgical History:  Procedure Laterality Date   CORONARY PRESSURE/FFR STUDY N/A 10/09/2020   Procedure: INTRAVASCULAR PRESSURE WIRE/FFR STUDY;  Surgeon: Lyn Records, MD;  Location: [facility] INVASIVE CV LAB;  Service: Cardiovascular;  Laterality: N/A;   CORONARY STENT INTERVENTION N/A 10/09/2020   Procedure: CORONARY STENT INTERVENTION;  Surgeon: Lyn Records, MD;  Location: [facility] INVASIVE CV LAB;  Service: Cardiovascular;  Laterality: N/A;   LEFT HEART CATH AND CORONARY ANGIOGRAPHY N/A 10/09/2020   Procedure:  LEFT HEART CATH AND CORONARY ANGIOGRAPHY;  Surgeon: Lyn Records, MD;  Location: [facility] INVASIVE CV LAB;  Service: Cardiovascular;  Laterality: N/A;   LOOP RECORDER INSERTION N/A 06/27/2018   Procedure: LOOP RECORDER INSERTION;  Surgeon: Regan Lemming, MD;  Location: [facility] INVASIVE CV LAB;  Service: Cardiovascular;  Laterality: N/A;   RIGHT HEART CATH AND CORONARY ANGIOGRAPHY N/A 04/05/2023   Procedure: RIGHT HEART CATH AND CORONARY ANGIOGRAPHY;  Surgeon: Laurey Morale, MD;  Location: [facility] INVASIVE CV LAB;  Service: Cardiovascular;  Laterality: N/A;   RIGHT/LEFT HEART CATH AND CORONARY ANGIOGRAPHY N/A 11/22/2021   Procedure: RIGHT/LEFT HEART CATH AND CORONARY ANGIOGRAPHY;  Surgeon: Laurey Morale, MD;  Location: [facility] INVASIVE CV LAB;  Service: Cardiovascular;  Laterality: N/A;   TEE WITHOUT CARDIOVERSION N/A 11/23/2021   Procedure: TRANSESOPHAGEAL ECHOCARDIOGRAM (TEE);  Surgeon: Laurey Morale, MD;  Location: [facility] ENDOSCOPY;  Service: Cardiovascular;  Laterality: N/A;   TEE WITHOUT CARDIOVERSION N/A 04/19/2022   Procedure: TRANSESOPHAGEAL ECHOCARDIOGRAM (TEE);  Surgeon: Laurey Morale, MD;  Location: [facility] ENDOSCOPY;  Service: Cardiovascular;  Laterality: N/A;     Social History:   reports that she has been smoking cigars and cigarettes. She has never used smokeless tobacco. She reports that she does not currently use alcohol. She reports that she does not currently use drugs after having used the following drugs: Cocaine and Marijuana.   Family History:  Her family history includes Hypertension in her brother, father, and mother; Stroke  in her sister.   Allergies No Known Allergies   Home Medications  Prior to Admission medications   Medication Sig Start Date End Date Taking? Authorizing Provider  atorvastatin (LIPITOR) 40 MG tablet Take 1 tablet (40 mg total) by mouth daily at 6 PM. 12/07/22  Yes Laurey Morale, MD  carvedilol (COREG) 6.25 MG tablet Take 1 tablet (6.25 mg total) by mouth 2  (two) times daily. 12/07/22  Yes Laurey Morale, MD  clopidogrel (PLAVIX) 75 MG tablet Take 1 tablet (75 mg total) by mouth daily. 12/07/22  Yes Laurey Morale, MD  empagliflozin (JARDIANCE) 10 MG TABS tablet Take 1 tablet (10 mg total) by mouth daily. 02/08/23  Yes Laurey Morale, MD  Evolocumab (REPATHA SURECLICK) 140 MG/ML SOAJ Inject 140 mg into the skin every 14 (fourteen) days. 03/10/23  Yes Hilty, Lisette Abu, MD  ezetimibe (ZETIA) 10 MG tablet Take 1 tablet (10 mg total) by mouth daily. 12/07/22  Yes Laurey Morale, MD  sacubitril-valsartan (ENTRESTO) 24-26 MG Take 1 tablet by mouth 2 (two) times daily. 02/10/23  Yes Laurey Morale, MD  spironolactone (ALDACTONE) 25 MG tablet Take 1 tablet (25 mg total) by mouth daily. 12/07/22  Yes Laurey Morale, MD     Critical care time: 40 mins     Christian Katrinka Blazing AGACNP-BC   [facility] 04/28/2023, 1:13 PM  Please see [email or web address] for pager details.  From 7A-7P if no response, please call [phone]. After hours, please call ELink [phone].

## 2023-04-28 NOTE — Brief Op Note (Signed)
 04/28/2023  10:05 AM  PATIENT:  Leslie Walker  58 y.o. female  PRE-OPERATIVE DIAGNOSIS:  1. SEVERE AORTIC INSUFFICIENCY 2. MODERATE AORTIC STENOSIS  POST-OPERATIVE DIAGNOSIS:  1. SEVERE AORTIC INSUFFICIENCY 2. MODERATE AORTIC STENOSIS  PROCEDURE:  ECHOCARDIOGRAM, TRANSESOPHAGEAL, INTRAOPERATIVE, AORTIC VALVE REPLACEMENT USING (an INSPIRIS RESILIA AORTIC VALVE, Model # 11500A, Serial # [id number], SIZE )   SURGEON:  Surgeons and Role:    Eugenio Hoes, MD - Primary  PHYSICIAN ASSISTANT: Doree Fudge PA-C  ASSISTANTS: Valene Bors RNFA   ANESTHESIA:   general  EBL: Per anesthesia, perfusion record  DRAINS:  Chest tubes placed in the mediastinal and pleural spaces    SPECIMEN:  Source of Specimen:  Native aortic valve leaflets  DISPOSITION OF SPECIMEN:  PATHOLOGY  COUNTS CORRECT:  YES  DICTATION: .Dragon Dictation  PLAN OF CARE: Admit to inpatient   PATIENT DISPOSITION:  ICU - intubated and hemodynamically stable.   Delay start of Pharmacological VTE agent (>24hrs) due to surgical blood loss or risk of bleeding: yes  BASELINE WEIGHT: 64.8 kg

## 2023-04-28 NOTE — Progress Notes (Signed)
 NIF -30 VC 436

## 2023-04-28 NOTE — Anesthesia Procedure Notes (Signed)
 Central Venous Catheter Insertion Performed by: Val Eagle, MD, anesthesiologist Start/End4/11/2023 7:01 AM, 04/28/2023 7:21 AM Patient location: Pre-op. Preanesthetic checklist: patient identified, IV checked, site marked, risks and benefits discussed, surgical consent, monitors and equipment checked, pre-op evaluation, timeout performed and anesthesia consent Lidocaine 1% used for infiltration and patient sedated Hand hygiene performed  and maximum sterile barriers used  Catheter size: 8.5 Fr Sheath introducer Procedure performed using ultrasound guided technique. Ultrasound Notes:anatomy identified, needle tip was noted to be adjacent to the nerve/plexus identified, no ultrasound evidence of intravascular and/or intraneural injection and image(s) printed for medical record Attempts: 1 Following insertion, line sutured, dressing applied and Biopatch. Post procedure assessment: blood return through all ports, free fluid flow and no air  Patient tolerated the procedure well with no immediate complications.

## 2023-04-28 NOTE — Anesthesia Procedure Notes (Signed)
 Central Venous Catheter Insertion Performed by: Val Eagle, MD, anesthesiologist Start/End4/11/2023 7:01 AM, 04/28/2023 7:21 AM Patient location: Pre-op. Preanesthetic checklist: patient identified, IV checked, site marked, risks and benefits discussed, surgical consent, monitors and equipment checked, pre-op evaluation, timeout performed and anesthesia consent Hand hygiene performed  and maximum sterile barriers used  PA cath was placed.Swan type:thermodilution PA Cath depth:49 Procedure performed without using ultrasound guided technique. Attempts: 1 Patient tolerated the procedure well with no immediate complications.

## 2023-04-28 NOTE — Op Note (Signed)
 CARDIOVASCULAR SURGERY OPERATIVE NOTE  04/28/2023 Leslie Walker [id number]  Surgeon:  Ashley Akin, MD  First Assistant: Doree Fudge [facility]                               An experienced assistant was required given the complexity of this surgery and the standard of surgical care. The assistant was needed for exposure, dissection, suctioning, retraction of delicate tissues and sutures, instrument exchange and for overall help during this procedure.     Preoperative Diagnosis: Severe Aortic Regurgitation  Postoperative Diagnosis:  Same   Procedure: Aortic Valve Replacement with a 21mm Inspiris Pericardial Valve  Anesthesia:  General Endotracheal   Clinical History/Surgical Indication:  58 yo female with NYHA class 2 symptoms of severe AI/moderate AS with now normal LV function. Awaiting cath to determine extent of CAD   Findings: There was normal ventricular function.  There was moderate to severe aortic insufficiency on preoperative TEE the conclusion of the procedure there was normal ventricular function, and normal sinus rhythm, with a well functioning aortic valve prosthesis with no regurgitation and a mean gradient of 5 mmHg.  Preparation:  The patient was seen in the preoperative holding area and the correct patient, correct operation were confirmed with the patient after reviewing the medical record and catheterization. The consent was signed by me. Preoperative antibiotics were given. A pulmonary arterial line and radial arterial line were placed by the anesthesia team. The patient was taken back to the operating room and positioned supine on the operating room table. After being placed under general endotracheal anesthesia by the anesthesia team a foley catheter was placed. The neck, chest, abdomen, and both legs were prepped with betadine soap and solution and draped in the usual sterile manner. A surgical time-out was taken and the correct patient and operative procedure  were confirmed with the nursing and anesthesia staff.  Operation: Median sternotomy incision was then created and the sternum above the sternal saw.  Simultaneously heparin was delivered and a pericardial well developed.  Aorta was cannulated with a 20 [place] Starns aortic cannula and a two-stage cannula was placed right atrium for venous return.  Antegrade and retrograde cardioplegia catheters were placed in the ascending or the coronary sinus was actively with adequate confirmation of anticoagulation Cardioquin bypass was instituted.  Left ventricular sump was placed across right superior pulmonary vein. Aortic cross-clamp was placed and cold blood [place] cardioplegia was delivered antegrade and retrograde for total of 5 minutes following arrest.  An additional direct coronary perfusion was also delivered with 300 cc down the left main and 200 cc down the right coronary artery. The aortotomy was then created and the aortic valve was identified, photographed, resected, and the annulus decalcified.  Utilizing fourteen 2-0 [place] pledgeted sutures with the pledgets on the ventricular side a 21 mm Inspiris pericardial valve was secured with the cor knot system.  The aortotomy was closed with a running 4 oh pledgeted Prolene suture.  The patient in headdown position aortic cross-clamp was removed and multiple de-airing maneuvers performed.  Ventricular and atrial pacing wires were placed and brought out the inferior stab was and secured.  With adequate de-airing the left ventricular sump was removed and the patient was weaned from cardiopulmonary bypass on inotropic support.  With adequate hemodynamics protamine was delivered and the patient was decannulated sites oversewn were necessary.  Chest tubes were brought inferior stab wounds and secured with adequate hemostasis  the sternum was reapproximated with interrupted stainless steel wire and the presternal subcutaneous tissue and skin were closed in multiple  layers absorbable suture.  Sterile dressings were applied.

## 2023-04-28 NOTE — Anesthesia Procedure Notes (Addendum)
 Arterial Line Insertion Start/End4/11/2023 6:50 AM, 04/28/2023 7:00 AM Performed by: Randon Goldsmith, CRNA, CRNA  Patient location: Pre-op. Preanesthetic checklist: patient identified, IV checked, site marked, risks and benefits discussed, surgical consent, monitors and equipment checked, pre-op evaluation, timeout performed and anesthesia consent Lidocaine 1% used for infiltration Left, radial was placed Catheter size: 20 G Hand hygiene performed , maximum sterile barriers used  and Seldinger technique used Allen's test indicative of satisfactory collateral circulation Attempts: 2 Procedure performed without using ultrasound guided technique. Following insertion, dressing applied. Post procedure assessment: normal and unchanged  Post procedure complications: unsuccessful attempts. Patient tolerated the procedure well with no immediate complications. Additional procedure comments: Attempt x1 by SRNA; attempt x2 by CRNA.

## 2023-04-28 NOTE — Discharge Summary (Signed)
 [address]       [place] [address]             [phone]    Physician Discharge Summary  Patient ID: Leslie Walker MRN: [id number] DOB/AGE: 20-Jan-1965 58 y.o.  Admit date: 04/28/2023 Discharge date: 05/03/2023  Admission Diagnoses:  Patient Active Problem List   Diagnosis Date Noted   Severe aortic regurgitation 04/28/2023   Nonrheumatic aortic valve insufficiency 04/19/2022   Coronary artery disease 11/20/2021   Elevated troponin 11/19/2021   Heart failure (HCC) 11/19/2021   Acute on chronic systolic heart failure (HCC) 11/18/2021   Chronic systolic heart failure (HCC)    Chest pain, precordial 10/09/2020   Cocaine use 10/09/2020   NSTEMI (non-ST elevated myocardial infarction) (HCC) 10/09/2020   Polysubstance abuse (HCC) 10/09/2020   Hyperlipidemia 10/09/2020   Lung nodule 10/09/2020   Tobacco dependence 10/09/2020   Screening breast examination 09/04/2018   Heroin dependence (HCC) 06/29/2018   TIA (transient ischemic attack) 06/24/2018   Nail fungus 04/20/2015   Porokeratosis 04/20/2015   History of ulceration 04/20/2015   Foot ulcer (HCC) 02/17/2014   Cellulitis of foot, right 02/17/2014   Chronic pain syndrome 02/17/2014   Essential hypertension 02/17/2014     Discharge Diagnoses:  Patient Active Problem List   Diagnosis Date Noted   Severe aortic regurgitation 04/28/2023   Nonrheumatic aortic valve insufficiency 04/19/2022   Coronary artery disease 11/20/2021   Elevated troponin 11/19/2021   Heart failure (HCC) 11/19/2021   Acute on chronic systolic heart failure (HCC) 11/18/2021   Chronic systolic heart failure (HCC)    Chest pain, precordial 10/09/2020   Cocaine use 10/09/2020   NSTEMI (non-ST elevated myocardial infarction) (HCC) 10/09/2020   Polysubstance abuse (HCC) 10/09/2020   Hyperlipidemia 10/09/2020   Lung nodule 10/09/2020   Tobacco dependence 10/09/2020   Screening breast examination 09/04/2018   Heroin  dependence (HCC) 06/29/2018   TIA (transient ischemic attack) 06/24/2018   Nail fungus 04/20/2015   Porokeratosis 04/20/2015   History of ulceration 04/20/2015   Foot ulcer (HCC) 02/17/2014   Cellulitis of foot, right 02/17/2014   Chronic pain syndrome 02/17/2014   Essential hypertension 02/17/2014     Discharged Condition: Stable  HPI: This is a 58 year old female who has a complicated cardiac history with previous CAD (s/p stenting) and cardiomyupathy with EF in the past below 20% along with severe aortic insufficiency. She has been medically optimized so that most recent echo with EF of 55% with severe AI and moderate AS. She has been feeling fatigued without lower extremity edema nor lightheadedness. She was seen in AHF clinic and was felt to require SAVR and a cardiac catheterization was scheduled for 3/19. She also has history of heroin and cocaine addiction that she reports only 1-2 times a week snorting heroin. She denies IV drug injections.  Cardiac catheterization showed mid Circumflex with an 80% stenosis. Dr. Honey Lusty discussed aortic valve replacement with the patient. Potential risks, benefits, and complications of the surgery were discussed with the patient and she agreed to proceed with surgery.  Hospital Course: Patient underwent an aortic valve replacement (Size 21 mm). She was transported from the OR to [facility] ICU in stable condition. She was extubated the afternoon of surgery without complication. She was hypertensive and required Cleviprex drip. This was weaned off on 04/13. She was started on Coreg and Entresto. She was on Amiodarone for afib prophylaxis. Swan ganz catheter and arterial line were removed without  complication. She was routinely diuresed and returned to preoperative weight. Epicardial pacing wires and chest tubes were removed on 04/13 without complication. She was transitioned off the Insulin drip. Her pre op HGA1C was 5.5. Accu checks and SS PRN will be stopped  after transfer to the floor. She was stable for transfer from the ICU to 4E for further convalescence on 04/13. She had hypotension the evening of 04/13 (was asymptomatic). Entresto, Amiodarone and Trazadone were held. BP improved and post op day 3, her HR was in the 100's. She was continued on oral Amiodarone and Coreg. Entresto was stopped and Spironolactone was held. With persistent tachycardia and BP much improved, Coreg was increased to 12.5 mg bid on 04/15. She was restarted on Plavix (history of CVA) and ec asa was decreased to 81 mg daily. WBC was 18,100 04/13 and on re check was down to 12,600. She had mild thrombocytopenia with last platelets at 133,000. She has been ambulating on room air with good oxygenation. She has been tolerating a diet and has had a bowel movement. Sternal wound is clean, dry, healing without signs of infection. As discussed with Dr. Honey Lusty, will restart low dose Spironolactone. Sutures to be removed prior to discharge. She is stable for discharge today.  Consults: pulmonary/intensive care  Significant Diagnostic Studies:   Narrative & Impression  CLINICAL DATA:  Pleural effusion   EXAM: CHEST - 2 VIEW   COMPARISON:  Chest x-ray 04/30/2023   FINDINGS: Sternotomy wires and prosthetic heart valve are again seen. Loop recorder device is seen in the anterior left chest wall. The cardiomediastinal silhouette is within normal limits. There is a small left pleural effusion. The lungs are clear. There is no pneumothorax or acute fracture.   IMPRESSION: Small left pleural effusion.     Electronically Signed   By: Tyron Gallon M.D.   On: 05/02/2023 15:20   Narrative & Impression  CLINICAL DATA:  Status post AVR.   EXAM: PORTABLE CHEST 1 VIEW   COMPARISON:  04/29/2023   FINDINGS: Interval removal of the pulmonary artery catheter with right IJ sheath still in situ. Mediastinal/pericardial drains persist. Lungs are clear without pneumothorax, edema, or  pleural effusion. Telemetry leads overlie the chest.   IMPRESSION: Interval removal of the pulmonary artery catheter. No acute cardiopulmonary findings.     Electronically Signed   By: Donnal Fusi M.D.   On: 04/30/2023 07:43    Treatments: surgery:  Aortic valve replacement (bioprosthetic, size 21 mm) by Dr. Honey Lusty on 04/28/2023.  Discharge Exam: Blood pressure (!) 150/94, pulse 88, temperature 98.8 F (37.1 C), temperature source Oral, resp. rate 16, height 5\' 4"  (1.626 m), weight 64.1 kg, SpO2 98%. Cardiovascular: RRR Pulmonary: Clear to auscultation bilaterally Abdomen: Soft, non tender, bowel sounds present. Extremities: No lower extremity edema Wounds: Sternal wound is clean and dry.  No erythema or signs of infection. No drainage from chest tube wounds   Discharge Medications:  The patient has been discharged on:   1.Beta Blocker:  Yes [  x ]                              No   [   ]                              If No, reason:  2.Ace Inhibitor/ARB: Yes [   ]  No  [   x ]                                     If No, reason:Labile BP  3.Statin:   Yes [  x ]                  No  [   ]                  If No, reason:  4.Ecasa:  Yes  [  x ]                  No   [   ]                  If No, reason:  Patient had ACS upon admission:No, previous CVA and on Plavix prior to surgery  Plavix/P2Y12 inhibitor: Yes [ x  ]                                      No  [   ]     Discharge Instructions     Amb Referral to Cardiac Rehabilitation   Complete by: As directed    Diagnosis: Valve Replacement   Valve: Aortic   After initial evaluation and assessments completed: Virtual Based Care may be provided alone or in conjunction with Phase 2 Cardiac Rehab based on patient barriers.: Yes   Intensive Cardiac Rehabilitation (ICR) [facility] location only OR Traditional Cardiac Rehabilitation (TCR) *If criteria for ICR are not met will enroll in  TCR [facility] only): Yes      Allergies as of 05/03/2023   No Known Allergies      Medication List     STOP taking these medications    Entresto 24-26 MG Generic drug: sacubitril-valsartan       TAKE these medications    aspirin EC 81 MG tablet Take 1 tablet (81 mg total) by mouth daily. Swallow whole.   atorvastatin 40 MG tablet Commonly known as: LIPITOR Take 1 tablet (40 mg total) by mouth daily at 6 PM.   carvedilol 12.5 MG tablet Commonly known as: COREG Take 1 tablet (12.5 mg total) by mouth 2 (two) times daily with a meal. What changed:  medication strength how much to take when to take this   clopidogrel 75 MG tablet Commonly known as: PLAVIX Take 1 tablet (75 mg total) by mouth daily.   ezetimibe 10 MG tablet Commonly known as: ZETIA Take 1 tablet (10 mg total) by mouth daily.   Jardiance 10 MG Tabs tablet Generic drug: empagliflozin Take 1 tablet (10 mg total) by mouth daily.   Oxycodone HCl 10 MG Tabs Take 1 tablet (10 mg total) by mouth every 6 (six) hours as needed for severe pain (pain score 7-10).   Repatha SureClick 140 MG/ML Soaj Generic drug: Evolocumab Inject 140 mg into the skin every 14 (fourteen) days.   spironolactone 25 MG tablet Commonly known as: ALDACTONE Take 0.5 tablets (12.5 mg total) by mouth daily. What changed: how much to take        Follow-up Information     [facility]. Go on 06/14/2023.   Specialty: Cardiology Why: Appointment time is at 11:00 am Contact information: [address] [place]  [place]  [address] [phone]        NP/PA Advanced Heart Failure. Go on 05/19/2023.   Why: Appointment time is at 11:30 am Contact information: [facility] [address] [place] [address] [phone]        [facility] at [facility] - A Department of [facility]. Go on  05/30/2023.   Specialty: Cardiothoracic Surgery Why: Appointment time is at 1:30 pm Contact information: [address], [address] [place]  [address] [phone]        [facility]. Go on 05/30/2023.   Why: Please arrive by 12:30 pm in order to have a PA/LAT CXR taken prior to office appointment with TCTS Contact information: [address] [place]  [address]                Signed:  Allegra Arch, PA-C 05/03/2023, 7:30 AM

## 2023-04-28 NOTE — Transfer of Care (Signed)
 Immediate Anesthesia Transfer of Care Note  Patient: Leslie Walker  Procedure(s) Performed: AORTIC VALVE REPLACEMENT USING INSPIRIS RESILIA AORTIC VALVE SIZE (Chest) ECHOCARDIOGRAM, TRANSESOPHAGEAL, INTRAOPERATIVE  Patient Location: ICU  Anesthesia Type:General  Level of Consciousness: Patient remains intubated per anesthesia plan  Airway & Oxygen Therapy: Patient remains intubated per anesthesia plan and Patient placed on Ventilator (see vital sign flow sheet for setting)  Post-op Assessment: Report given to RN and Post -op Vital signs reviewed and stable  Post vital signs: Reviewed and stable  Last Vitals:  Vitals Value Taken Time  BP 105/65 04/28/23 1058  Temp    Pulse 82 04/28/23 1058  Resp 16 04/28/23 1058  SpO2 96 % 04/28/23 1058    Last Pain:  Vitals:   04/28/23 0617  TempSrc:   PainSc: 0-No pain         Complications: No notable events documented.

## 2023-04-29 ENCOUNTER — Inpatient Hospital Stay (HOSPITAL_COMMUNITY)

## 2023-04-29 DIAGNOSIS — I10 Essential (primary) hypertension: Secondary | ICD-10-CM | POA: Diagnosis not present

## 2023-04-29 DIAGNOSIS — E871 Hypo-osmolality and hyponatremia: Secondary | ICD-10-CM

## 2023-04-29 DIAGNOSIS — I351 Nonrheumatic aortic (valve) insufficiency: Secondary | ICD-10-CM | POA: Diagnosis not present

## 2023-04-29 DIAGNOSIS — Z952 Presence of prosthetic heart valve: Secondary | ICD-10-CM | POA: Diagnosis not present

## 2023-04-29 DIAGNOSIS — R739 Hyperglycemia, unspecified: Secondary | ICD-10-CM | POA: Diagnosis not present

## 2023-04-29 DIAGNOSIS — F1111 Opioid abuse, in remission: Secondary | ICD-10-CM

## 2023-04-29 LAB — CBC
HCT: 33.2 % — ABNORMAL LOW (ref 36.0–46.0)
HCT: 34 % — ABNORMAL LOW (ref 36.0–46.0)
HCT: 34.1 % — ABNORMAL LOW (ref 36.0–46.0)
Hemoglobin: 12.4 g/dL (ref 12.0–15.0)
Hemoglobin: 12.6 g/dL (ref 12.0–15.0)
Hemoglobin: 12.8 g/dL (ref 12.0–15.0)
MCH: 30.8 pg (ref 26.0–34.0)
MCH: 30.3 pg (ref 26.0–34.0)
MCH: 31.4 pg (ref 26.0–34.0)
MCHC: 37.3 g/dL — ABNORMAL HIGH (ref 30.0–36.0)
MCHC: 37.1 g/dL — ABNORMAL HIGH (ref 30.0–36.0)
MCHC: 37.5 g/dL — ABNORMAL HIGH (ref 30.0–36.0)
MCV: 82.6 fL (ref 80.0–100.0)
MCV: 81.7 fL (ref 80.0–100.0)
MCV: 83.6 fL (ref 80.0–100.0)
Platelets: 154 10*3/uL (ref 150–400)
Platelets: 159 10*3/uL (ref 150–400)
Platelets: 168 10*3/uL (ref 150–400)
RBC: 4.02 MIL/uL (ref 3.87–5.11)
RBC: 4.16 MIL/uL (ref 3.87–5.11)
RBC: 4.08 MIL/uL (ref 3.87–5.11)
RDW: 12.9 % (ref 11.5–15.5)
RDW: 12.9 % (ref 11.5–15.5)
RDW: 13.2 % (ref 11.5–15.5)
WBC: 15.6 10*3/uL — ABNORMAL HIGH (ref 4.0–10.5)
WBC: 15.6 10*3/uL — ABNORMAL HIGH (ref 4.0–10.5)
WBC: 18.2 10*3/uL — ABNORMAL HIGH (ref 4.0–10.5)
nRBC: 0 % (ref 0.0–0.2)
nRBC: 0 % (ref 0.0–0.2)
nRBC: 0.1 % (ref 0.0–0.2)

## 2023-04-29 LAB — GLUCOSE, CAPILLARY
Glucose-Capillary: 109 mg/dL — ABNORMAL HIGH (ref 70–99)
Glucose-Capillary: 133 mg/dL — ABNORMAL HIGH (ref 70–99)
Glucose-Capillary: 134 mg/dL — ABNORMAL HIGH (ref 70–99)
Glucose-Capillary: 138 mg/dL — ABNORMAL HIGH (ref 70–99)
Glucose-Capillary: 139 mg/dL — ABNORMAL HIGH (ref 70–99)
Glucose-Capillary: 160 mg/dL — ABNORMAL HIGH (ref 70–99)
Glucose-Capillary: 131 mg/dL — ABNORMAL HIGH (ref 70–99)
Glucose-Capillary: 156 mg/dL — ABNORMAL HIGH (ref 70–99)
Glucose-Capillary: 132 mg/dL — ABNORMAL HIGH (ref 70–99)
Glucose-Capillary: 100 mg/dL — ABNORMAL HIGH (ref 70–99)
Glucose-Capillary: 107 mg/dL — ABNORMAL HIGH (ref 70–99)

## 2023-04-29 LAB — BASIC METABOLIC PANEL WITH GFR
Anion gap: 11 (ref 5–15)
Anion gap: 10 (ref 5–15)
BUN: 10 mg/dL (ref 6–20)
BUN: 19 mg/dL (ref 6–20)
CO2: 21 mmol/L — ABNORMAL LOW (ref 22–32)
CO2: 22 mmol/L (ref 22–32)
Calcium: 8 mg/dL — ABNORMAL LOW (ref 8.9–10.3)
Calcium: 8.2 mg/dL — ABNORMAL LOW (ref 8.9–10.3)
Chloride: 97 mmol/L — ABNORMAL LOW (ref 98–111)
Chloride: 96 mmol/L — ABNORMAL LOW (ref 98–111)
Creatinine, Ser: 0.73 mg/dL (ref 0.44–1.00)
Creatinine, Ser: 1.12 mg/dL — ABNORMAL HIGH (ref 0.44–1.00)
GFR, Estimated: >60 mL/min (ref >60)
GFR, Estimated: 57 mL/min — ABNORMAL LOW (ref >60)
Glucose, Bld: 115 mg/dL — ABNORMAL HIGH (ref 70–99)
Glucose, Bld: 149 mg/dL — ABNORMAL HIGH (ref 70–99)
Potassium: 3.8 mmol/L (ref 3.5–5.1)
Potassium: 3.5 mmol/L (ref 3.5–5.1)
Sodium: 129 mmol/L — ABNORMAL LOW (ref 135–145)
Sodium: 128 mmol/L — ABNORMAL LOW (ref 135–145)

## 2023-04-29 LAB — MAGNESIUM
Magnesium: 2.3 mg/dL (ref 1.7–2.4)
Magnesium: 2.1 mg/dL (ref 1.7–2.4)

## 2023-04-29 MED ORDER — POTASSIUM CHLORIDE CRYS ER 20 MEQ PO TBCR
20.0000 meq | EXTENDED_RELEASE_TABLET | ORAL | Status: AC
  Administered 2023-04-29 – 2023-04-30 (×3): 20 meq via ORAL
  Filled 2023-04-29 (×3): qty 1

## 2023-04-29 MED ORDER — METOPROLOL TARTRATE 25 MG/10 ML ORAL SUSPENSION: 12.5000 mg | Freq: Two times a day (BID) | ORAL | Status: DC

## 2023-04-29 MED ORDER — INSULIN ASPART 100 UNIT/ML IJ SOLN
0.0000 [IU] | INTRAMUSCULAR | Status: DC
  Administered 2023-04-29 – 2023-04-30 (×4): 2 [IU] via SUBCUTANEOUS
  Administered 2023-04-30: 4 [IU] via SUBCUTANEOUS
  Administered 2023-04-30 (×2): 2 [IU] via SUBCUTANEOUS

## 2023-04-29 MED ORDER — METOPROLOL TARTRATE 25 MG PO TABS: 25.0000 mg | ORAL_TABLET | Freq: Two times a day (BID) | ORAL | Status: DC

## 2023-04-29 MED ORDER — ENOXAPARIN SODIUM 40 MG/0.4ML IJ SOSY
40.0000 mg | PREFILLED_SYRINGE | Freq: Every day | INTRAMUSCULAR | Status: DC
  Administered 2023-04-29 – 2023-05-02 (×4): 40 mg via SUBCUTANEOUS
  Filled 2023-04-29 (×4): qty 0.4

## 2023-04-29 MED ORDER — HYDRALAZINE HCL 20 MG/ML IJ SOLN
10.0000 mg | Freq: Four times a day (QID) | INTRAMUSCULAR | Status: DC | PRN
  Administered 2023-04-29 – 2023-04-30 (×3): 10 mg via INTRAVENOUS
  Filled 2023-04-29 (×3): qty 1

## 2023-04-29 MED ORDER — CARVEDILOL 6.25 MG PO TABS
6.2500 mg | ORAL_TABLET | Freq: Two times a day (BID) | ORAL | Status: DC
Start: 2023-04-29 — End: 2023-05-02
  Administered 2023-04-29 – 2023-05-01 (×6): 6.25 mg via ORAL
  Filled 2023-04-29 (×6): qty 1

## 2023-04-29 MED ORDER — INSULIN ASPART 100 UNIT/ML IJ SOLN: 0.0000 [IU] | INTRAMUSCULAR | Status: DC

## 2023-04-29 NOTE — Progress Notes (Signed)
      [address]       [place] [address]             [phone]      1 Day Post-Op  Procedure(s) (LRB): AORTIC VALVE REPLACEMENT USING INSPIRIS RESILIA AORTIC VALVE SIZE (N/A) ECHOCARDIOGRAM, TRANSESOPHAGEAL, INTRAOPERATIVE (N/A)   Total Length of Stay:  LOS: 1 day    SUBJECTIVE: Wants to walk Very uncomfortable  Vitals:   04/29/23 0700 04/29/23 0758  BP: 116/76   Pulse: 93 96  Resp: (!) 27 (!) 28  Temp: 99.3 F (37.4 C) 99.1 F (37.3 C)  SpO2: 96% 98%    Intake/Output      04/11 0701 04/12 0700 04/12 0701 04/13 0700   I.V. (mL/kg) 2428.2 (37.2)    Blood 230    IV Piggyback 1194    Total Intake(mL/kg) 3852.1 (59)    Urine (mL/kg/hr) 3835 (2.4)    Blood 483    Chest Tube 450    Total Output 4768    Net -915.9             sodium chloride Stopped (04/29/23 0623)   sodium chloride     albumin human      ceFAZolin (ANCEF) IV Stopped (04/29/23 0538)   clevidipine 4 mg/hr (04/29/23 0700)   insulin 0.8 Units/hr (04/29/23 0700)   lactated ringers Stopped (04/29/23 0526)    CBC    Component Value Date/Time   WBC 19.4 (H) 04/28/2023 1723   RBC 3.90 04/28/2023 1723   HGB 12.0 04/28/2023 1723   HCT 32.4 (L) 04/28/2023 1723   PLT 175 04/28/2023 1723   MCV 83.1 04/28/2023 1723   MCH 30.8 04/28/2023 1723   MCHC 37.0 (H) 04/28/2023 1723   RDW 12.9 04/28/2023 1723   LYMPHSABS 2.8 11/19/2021 0339   MONOABS 0.4 11/19/2021 0339   EOSABS 0.2 11/19/2021 0339   BASOSABS 0.1 11/19/2021 0339   CMP     Component Value Date/Time   NA 129 (L) 04/29/2023 0401   NA 145 (H) 05/11/2021 1641   K 3.8 04/29/2023 0401   CL 97 (L) 04/29/2023 0401   CO2 21 (L) 04/29/2023 0401   GLUCOSE 115 (H) 04/29/2023 0401   BUN 10 04/29/2023 0401   BUN 13 05/11/2021 1641   CREATININE 0.73 04/29/2023 0401   CALCIUM 8.0 (L) 04/29/2023 0401   PROT 7.2 04/26/2023 1230   PROT 6.9 03/03/2021 1132   ALBUMIN 3.7 04/26/2023 1230   ALBUMIN 4.1 03/03/2021 1132    AST 20 04/26/2023 1230   ALT 15 04/26/2023 1230   ALKPHOS 58 04/26/2023 1230   BILITOT 0.6 04/26/2023 1230   BILITOT 0.7 03/03/2021 1132   GFRNONAA >60 04/29/2023 0401   GFRAA >60 06/29/2018 0832   ABG    Component Value Date/Time   PHART 7.371 04/28/2023 1534   PCO2ART 40.4 04/28/2023 1534   PO2ART 199 (H) 04/28/2023 1534   HCO3 23.2 04/28/2023 1534   TCO2 24 04/28/2023 1534   ACIDBASEDEF 2.0 04/28/2023 1534   O2SAT 100 04/28/2023 1534   CBG (last 3)  Recent Labs    04/29/23 0513 04/29/23 0605 04/29/23 0801  GLUCAP 134* 139* 160*  EXAM Lungs: overall clear Card: RR  Ext: warm Neuro: alert   ASSESSMENT: POD #1 sp AVR Doing well Will wean drips for SBP > 150. Will start carvedilol Lasix DC swan and aline ambulate  Melene Sportsman, MD 04/29/2023

## 2023-04-29 NOTE — Progress Notes (Signed)
 NAMENorris Walker, MRN:  [id number], DOB:  [birth year], LOS: 1 ADMISSION DATE:  04/28/2023, CONSULTATION DATE:  04/28/23 REFERRING MD:  MD Leslie Walker  CHIEF COMPLAINT: Increased fatigue  History of Present Illness:  Patient is a 58 year old female with significant past medical history hypertension, with chronic systolic heart failure, severe coronary artery disease status post stenting on Plavix and cardiomyopathy with less than 20% along with severe aortic insufficiency and heroin/cocaine abuse disorder.  Patient's most recent echo revealed an EF of 55% with moderate aortic stenosis and severe aortic insufficiency with medical management.  She had been in her usual state of health but began developing increased fatigue and was recently seen at her advanced heart failure clinic where the recommendations were made for for cath on 3/19 and possible SAVR.  Patient underwent aortic valve replacement for severe aortic insufficiency and moderate aortic stenosis.  PCCM consulted for postop vent management as well as assist with medical care and while in ICU.  Past Medical History:  Diagnosis Date   Chronic systolic heart failure (HCC)    Cocaine abuse (HCC)    last use about 1 year ago (04/26/23)   Coronary artery disease    Dyslipidemia    Hypertension    Noncompliance w/medication treatment due to intermit use of medication    Stroke (HCC) 2021   no deficits     Significant Hospital Events: Including procedures, antibiotic start and stop dates in addition to other pertinent events   4/11 severe aortic insufficiency and moderate aortic stenosis status post AVR, extubated post-op  Interim History / Subjective:  Pain is ok, mild nausea. Tmax 100.8.  Objective   Blood pressure 116/76, pulse 93, temperature 99.3 F (37.4 C), resp. rate (!) 27, height 5\' 4"  (1.626 m), weight 65.3 kg, SpO2 96%. PAP: (9-103)/(-1-27) 22/10 CO:  [3.2 L/min-5.7 L/min] 4 L/min CI:  [1.91 L/min/m2-3.36 L/min/m2] 2.4  L/min/m2  Vent Mode: CPAP;PSV FiO2 (%):  [40 %-50 %] 40 % Set Rate:  [4 bmp-16 bmp] 4 bmp Vt Set:  [440 mL] 440 mL PEEP:  [5 cmH20] 5 cmH20 Pressure Support:  [5 cmH20-10 cmH20] 5 cmH20   Intake/Output Summary (Last 24 hours) at 04/29/2023 0717 Last data filed at 04/29/2023 0700 Gross per 24 hour  Intake 3852.13 ml  Output 4768 ml  Net -915.87 ml   Filed Weights   04/28/23 0609 04/29/23 0600  Weight: 64.8 kg 65.3 kg    Examination: General: middle aged woman sitting up in bed in NAD HENT: [place]/AT, eyes anicteric Lungs: breathing comfortably on RA, CTAB Cardiovascular: S1S2, RRR Abdomen: soft, NT Extremities: no significant edema  Neuro: awake, alert, answering questions appropriately. Moving all extremities.  GU: foley with clear yellow urine  Na+ 129 BUN 10 Cr 0.73 H/H 12.4/33.2 Platelets 154 CXR personally reviewed> no infiltrates, RIJ CVC with [facility] Problem list   N/A  Assessment & Plan:  Severe aortic insufficiency and moderate aortic stenosis, status post bioprosthetic AV replacement CAD s/p DES in 2022-on Plavix -postop management per TCTS. Keep chest tubes today, d/c A-line, Swan, & foley -con't aspirin, statin -amiodarone for Afib prophylaxis -change metoprolol to coreg for better BP control; wean off nicardipine today -hydralazine PRN for sustained SBP >130 -complete post-op antibiotics -progress diet and mobility as able -pain management per protocol; due to chronic heroin use have increased oxycodone dose to ensure adequate analgesia  Hypertension -start coreg 6.25mg , stop metoprolol. Wean off cleviprex as able -hydralazine PRN -when BP allows, can  restart low dose Entresto & spiro  HLD -PTA repatha on hold while in the hospital -statin  Expected post-operative ABLA, stable -no current indication for transfusions  Polysubstance abuse disorder -Heroin/cocaine -recommend total cessation -con't higher dose oxycodone, pain control  per protocol  Hyperglycemia -transition off insulin gtt to SSI  -goal BG <180  Hyponatremia -avoid hypotonic solutions -monitor   Best Practice (right click and "Reselect all SmartList Selections" daily)   Diet/type: clear liquids DVT prophylaxis LMWH Pressure ulcer(s): N/A GI prophylaxis: PPI Lines: Central line Foley:  Yes, and it is still needed Code Status:  full code Last date of multidisciplinary goals of care discussion [per primary]   Labs   CBC: Recent Labs  Lab 04/26/23 1230 04/28/23 0800 04/28/23 0930 04/28/23 0946 04/28/23 1017 04/28/23 1112 04/28/23 1348 04/28/23 1534 04/28/23 1723  WBC 5.8  --   --   --   --  15.1*  --   --  19.4*  HGB 14.9   < > 9.4*   < > 8.8* 11.9*  12.0 11.6* 11.2* 12.0  HCT 40.8   < > 25.7*   < > 26.0* 35.0*  32.9* 34.0* 33.0* 32.4*  MCV 84.5  --   --   --   --  83.1  --   --  83.1  PLT 258  --  156  --   --  171  --   --  175   < > = values in this interval not displayed.    Basic Metabolic Panel: Recent Labs  Lab 04/26/23 1230 04/28/23 0800 04/28/23 0834 04/28/23 0844 04/28/23 0917 04/28/23 0946 04/28/23 1014 04/28/23 1017 04/28/23 1112 04/28/23 1348 04/28/23 1534 04/28/23 1723 04/29/23 0401  NA 138   < > 138   < > 137   < > 136   < > 139 138 136 133* 129*  K 4.7   < > 4.0   < > 5.0   < > 5.7*   < > 3.9 4.5 4.6 5.4* 3.8  CL 102   < > 104  --  102  --  102  --   --   --   --  103 97*  CO2 29  --   --   --   --   --   --   --   --   --   --  21* 21*  GLUCOSE 107*   < > 134*  --  157*  --  200*  --   --   --   --  111* 115*  BUN 15   < > 16  --  14  --  16  --   --   --   --  13 10  CREATININE 0.98   < > 0.80  --  0.80  --  0.80  --   --   --   --  0.76 0.73  CALCIUM 9.9  --   --   --   --   --   --   --   --   --   --  7.6* 8.0*  MG  --   --   --   --   --   --   --   --   --   --   --  3.3* 2.3   < > = values in this interval not displayed.   GFR: Estimated Creatinine Clearance: 66.2 mL/min (by C-G formula  based on  SCr of 0.73 mg/dL). Recent Labs  Lab 04/26/23 1230 04/28/23 1112 04/28/23 1723  WBC 5.8 15.1* 19.4*     Critical care time:      This patient is critically ill with multiple organ system failure which requires frequent high complexity decision making, assessment, support, evaluation, and titration of therapies. This was completed through the application of advanced monitoring technologies and extensive interpretation of multiple databases. During this encounter critical care time was devoted to patient care services described in this note for 34 minutes.   Joesph Mussel, DO 04/29/23 9:01 AM [facility]  For contact information, see Amion. If no response to pager, please call PCCM consult pager. After hours, 7PM- 7AM, please call Elink.

## 2023-04-30 ENCOUNTER — Inpatient Hospital Stay (HOSPITAL_COMMUNITY)

## 2023-04-30 DIAGNOSIS — I10 Essential (primary) hypertension: Secondary | ICD-10-CM | POA: Diagnosis not present

## 2023-04-30 DIAGNOSIS — Z952 Presence of prosthetic heart valve: Secondary | ICD-10-CM | POA: Diagnosis not present

## 2023-04-30 DIAGNOSIS — G4700 Insomnia, unspecified: Secondary | ICD-10-CM

## 2023-04-30 DIAGNOSIS — I351 Nonrheumatic aortic (valve) insufficiency: Secondary | ICD-10-CM | POA: Diagnosis not present

## 2023-04-30 DIAGNOSIS — R739 Hyperglycemia, unspecified: Secondary | ICD-10-CM | POA: Diagnosis not present

## 2023-04-30 LAB — BASIC METABOLIC PANEL WITH GFR
Anion gap: 10 (ref 5–15)
BUN: 19 mg/dL (ref 6–20)
CO2: 23 mmol/L (ref 22–32)
Calcium: 8.6 mg/dL — ABNORMAL LOW (ref 8.9–10.3)
Chloride: 100 mmol/L (ref 98–111)
Creatinine, Ser: 0.95 mg/dL (ref 0.44–1.00)
GFR, Estimated: >60 mL/min (ref >60)
Glucose, Bld: 145 mg/dL — ABNORMAL HIGH (ref 70–99)
Potassium: 4.1 mmol/L (ref 3.5–5.1)
Sodium: 133 mmol/L — ABNORMAL LOW (ref 135–145)

## 2023-04-30 LAB — CBC
Hemoglobin: 12.4 g/dL (ref 12.0–15.0)
Platelets: 170 10*3/uL (ref 150–400)
WBC: 18.1 10*3/uL — ABNORMAL HIGH (ref 4.0–10.5)

## 2023-04-30 LAB — GLUCOSE, CAPILLARY
Glucose-Capillary: 140 mg/dL — ABNORMAL HIGH (ref 70–99)
Glucose-Capillary: 182 mg/dL — ABNORMAL HIGH (ref 70–99)
Glucose-Capillary: 139 mg/dL — ABNORMAL HIGH (ref 70–99)
Glucose-Capillary: 102 mg/dL — ABNORMAL HIGH (ref 70–99)
Glucose-Capillary: 131 mg/dL — ABNORMAL HIGH (ref 70–99)

## 2023-04-30 MED ORDER — SACUBITRIL-VALSARTAN 24-26 MG PO TABS
1.0000 | ORAL_TABLET | Freq: Two times a day (BID) | ORAL | Status: DC
  Administered 2023-04-30: 1 via ORAL
  Filled 2023-04-30 (×3): qty 1

## 2023-04-30 MED ORDER — LABETALOL HCL 5 MG/ML IV SOLN
10.0000 mg | INTRAVENOUS | Status: DC | PRN
  Filled 2023-04-30: qty 4

## 2023-04-30 MED ORDER — QUETIAPINE FUMARATE 25 MG PO TABS
25.0000 mg | ORAL_TABLET | Freq: Every evening | ORAL | Status: DC | PRN
Start: 2023-04-30 — End: 2023-05-03
  Administered 2023-04-30 – 2023-05-01 (×2): 25 mg via ORAL
  Filled 2023-04-30 (×2): qty 1

## 2023-04-30 MED ORDER — MELATONIN 3 MG PO TABS
3.0000 mg | ORAL_TABLET | Freq: Every day | ORAL | Status: DC
  Administered 2023-04-30 – 2023-05-02 (×3): 3 mg via ORAL
  Filled 2023-04-30 (×3): qty 1

## 2023-04-30 MED ORDER — SPIRONOLACTONE 12.5 MG HALF TABLET
12.5000 mg | ORAL_TABLET | Freq: Every day | ORAL | Status: DC
  Administered 2023-04-30 – 2023-05-01 (×2): 12.5 mg via ORAL
  Filled 2023-04-30 (×2): qty 1

## 2023-04-30 MED ORDER — TRAZODONE HCL 50 MG PO TABS
50.0000 mg | ORAL_TABLET | Freq: Every day | ORAL | Status: DC
  Administered 2023-05-01 – 2023-05-02 (×2): 50 mg via ORAL
  Filled 2023-04-30 (×3): qty 1

## 2023-04-30 NOTE — Progress Notes (Signed)
 Pt's still hypotensive. BP 79/57 and 86/55. Pt denied CP, dizziness, lightheaded, discomfort. No distress noted. Dr. Honey Lusty notified. No new orders. Will continue to monitor.

## 2023-04-30 NOTE — Progress Notes (Signed)
 Pt's BP runs low tonight 86/71 MAP 76. Pt is asymptomatic, denied CP, dizziness, lightheaded. No distress noted. Dr. Honey Lusty notified. Dr. Honey Lusty said to hold amiodarone, Entresto, and Trazodone, and to give seroquel and melatonin. Will continue to monitor.   04/30/23 2002  Vitals  Temp 97.9 F (36.6 C)  Temp Source Oral  BP (!) 86/71  MAP (mmHg) 76  BP Location Right Arm  BP Method Automatic  Patient Position (if appropriate) Lying  Pulse Rate 95  Pulse Rate Source Monitor  ECG Heart Rate 94  Resp 16  MEWS COLOR  MEWS Score Color Green  Oxygen Therapy  SpO2 97 %  O2 Device Room Air  MEWS Score  MEWS Temp 0  MEWS Systolic 1  MEWS Pulse 0  MEWS RR 0  MEWS LOC 0  MEWS Score 1

## 2023-04-30 NOTE — Plan of Care (Signed)
  Problem: Education: Goal: Knowledge of General Education information will improve Description: Including pain rating scale, medication(s)/side effects and non-pharmacologic comfort measures Outcome: Progressing   Problem: Clinical Measurements: Goal: Ability to maintain clinical measurements within normal limits will improve Outcome: Progressing Goal: Will remain free from infection Outcome: Progressing Goal: Diagnostic test results will improve Outcome: Progressing Goal: Respiratory complications will improve Outcome: Progressing Goal: Cardiovascular complication will be avoided Outcome: Progressing   Problem: Activity: Goal: Risk for activity intolerance will decrease Outcome: Progressing   Problem: Nutrition: Goal: Adequate nutrition will be maintained Outcome: Progressing   Problem: Coping: Goal: Level of anxiety will decrease Outcome: Progressing   Problem: Elimination: Goal: Will not experience complications related to bowel motility Outcome: Progressing Goal: Will not experience complications related to urinary retention Outcome: Progressing   Problem: Pain Managment: Goal: General experience of comfort will improve and/or be controlled Outcome: Progressing   Problem: Safety: Goal: Ability to remain free from injury will improve Outcome: Progressing   Problem: Skin Integrity: Goal: Risk for impaired skin integrity will decrease Outcome: Progressing   Problem: Education: Goal: Knowledge of disease or condition will improve Outcome: Progressing   Problem: Activity: Goal: Risk for activity intolerance will decrease Outcome: Progressing   Problem: Cardiac: Goal: Will achieve and/or maintain hemodynamic stability Outcome: Progressing   Problem: Clinical Measurements: Goal: Postoperative complications will be avoided or minimized Outcome: Progressing   Problem: Respiratory: Goal: Respiratory status will improve Outcome: Progressing   Problem: Skin  Integrity: Goal: Wound healing without signs and symptoms of infection Outcome: Progressing   Problem: Urinary Elimination: Goal: Ability to achieve and maintain adequate renal perfusion and functioning will improve Outcome: Progressing   Problem: Activity: Goal: Risk for activity intolerance will decrease Outcome: Progressing   Problem: Cardiac: Goal: Will achieve and/or maintain hemodynamic stability Outcome: Progressing   Problem: Respiratory: Goal: Respiratory status will improve Outcome: Progressing   Problem: Skin Integrity: Goal: Wound healing without signs and symptoms of infection Outcome: Progressing   Problem: Urinary Elimination: Goal: Ability to achieve and maintain adequate renal perfusion and functioning will improve Outcome: Progressing

## 2023-04-30 NOTE — Progress Notes (Signed)
 [address]       [place] [address]             [phone]      2 Days Post-Op Procedure(s) (LRB): AORTIC VALVE REPLACEMENT USING INSPIRIS RESILIA AORTIC VALVE SIZE (N/A) ECHOCARDIOGRAM, TRANSESOPHAGEAL, INTRAOPERATIVE (N/A) Subjective: Patient using bedside commode. She does complain of some loose stools. Per RN she had bloody drainage that has oozed around left chest tube and clot in tube. Only dumped 30cc once clot was cleared.   Objective: Vital signs in last 24 hours: Temp:  [98 F (36.7 C)-99.5 F (37.5 C)] 98 F (36.7 C) (04/13 0756) Pulse Rate:  [79-118] 118 (04/13 0730) Cardiac Rhythm: Normal sinus rhythm (04/13 0400) Resp:  [14-40] 40 (04/13 0730) BP: (118-190)/(72-123) 127/99 (04/13 0730) SpO2:  [75 %-100 %] 98 % (04/13 0730) Arterial Line BP: (135-157)/(68-78) 157/74 (04/12 0930) Weight:  [64.7 kg] 64.7 kg (04/13 0500)  Hemodynamic parameters for last 24 hours: PAP: (22)/(8-9) 22/8  Intake/Output from previous day: 04/12 0701 - 04/13 0700 In: 359.6 [I.V.:105.7; IV Piggyback:253.8] Out: 845 [Urine:725; Chest Tube:120] Intake/Output this shift: No intake/output data recorded.  General appearance: alert, cooperative, and no distress Neurologic: intact Heart: sinus tachycardia, no murmur Lungs: Clear to auscultation bilaterally Abdomen: soft, non-tender; bowel sounds normal; no masses,  no organomegaly Extremities: extremities normal, atraumatic, no cyanosis or edema Wound: Clean and dry sternal dressing in place  Lab Results: Recent Labs    04/29/23 1712 04/30/23 0458  WBC 18.2* 18.1*  HGB 12.8 12.4  HCT 34.1* RESULTS UNAVAILABLE DUE TO INTERFERING SUBSTANCE  PLT 168 170   BMET:  Recent Labs    04/29/23 1712 04/30/23 0458  NA 128* 133*  K 3.5 4.1  CL 96* 100  CO2 22 23  GLUCOSE 149* 145*  BUN 19 19  CREATININE 1.12* 0.95  CALCIUM 8.2* 8.6*    PT/INR:  Recent Labs    04/28/23 1112  LABPROT 18.3*  INR  1.5*   ABG    Component Value Date/Time   PHART 7.371 04/28/2023 1534   HCO3 23.2 04/28/2023 1534   TCO2 24 04/28/2023 1534   ACIDBASEDEF 2.0 04/28/2023 1534   O2SAT 100 04/28/2023 1534   CBG (last 3)  Recent Labs    04/29/23 2328 04/30/23 0318 04/30/23 0753  GLUCAP 107* 140* 182*    Assessment/Plan: S/P Procedure(s) (LRB): AORTIC VALVE REPLACEMENT USING INSPIRIS RESILIA AORTIC VALVE SIZE (N/A) ECHOCARDIOGRAM, TRANSESOPHAGEAL, INTRAOPERATIVE (N/A)  Neuro: Hx of polysubstance abuse (heroin/cocaine) recommend cessation.   CV: HTN looks to be improved, BP 127/99 this AM. Entresto 24-26mg  BID started this AM and on Coreg 6.25mg  BID. Cleviprex weaned off this AM, on Hydralazine and labetalol PRN for SBP>130. Likely plan to restart Spironolactone tomorrow if BP allows. On Amiodarone for afib prophylaxis. ST, HR 110s. D/C EPW.   Pulm: Saturating well on RA. CT output 120cc/24hrs. Some bloody drainage around chest tube likely due to clot, only dumped 30cc when clot was cleared by RN. D/C Chest tubes 3 hours after EPW if stable. Encourage IS and ambulation.   GI: +BM, some loose stools. Hold stool softeners today. Lack of appetite but tolerating a diet.   Endo: Preop A1C 5.5. CBGs controlled on SSI PRN.   Renal: Improved Cr 0.95 down from 1.12. UO 725cc/24hrs recorded. At preop weight.   ID: Likely reactive leukocytosis, stable at 18.1. Tmax 99.5. Monitor.   Expected postop ABLA: Hct stable at 12.4. Will monitor.  DVT Prophylaxis: Lovenox  Dispo: Continue ICU care    LOS: 2 days    Randa Burton, PA-C 04/30/2023

## 2023-04-30 NOTE — Progress Notes (Signed)
 Leslie Walker, MRN:  [id number], DOB:  [birth year], LOS: 2 ADMISSION DATE:  04/28/2023, CONSULTATION DATE:  04/28/23 REFERRING MD:  MD Honey Lusty  CHIEF COMPLAINT: Increased fatigue  History of Present Illness:  Patient is a 58 year old female with significant past medical history hypertension, with chronic systolic heart failure, severe coronary artery disease status post stenting on Plavix and cardiomyopathy with less than 20% along with severe aortic insufficiency and heroin/cocaine abuse disorder.  Patient's most recent echo revealed an EF of 55% with moderate aortic stenosis and severe aortic insufficiency with medical management.  She had been in her usual state of health but began developing increased fatigue and was recently seen at her advanced heart failure clinic where the recommendations were made for for cath on 3/19 and possible SAVR.  Patient underwent aortic valve replacement for severe aortic insufficiency and moderate aortic stenosis.  PCCM consulted for postop vent management as well as assist with medical care and while in ICU.  Past Medical History:  Diagnosis Date   Chronic systolic heart failure (HCC)    Cocaine abuse (HCC)    last use about 1 year ago (04/26/23)   Coronary artery disease    Dyslipidemia    Hypertension    Noncompliance w/medication treatment due to intermit use of medication    Stroke (HCC) 2021   no deficits     Significant Hospital Events: Including procedures, antibiotic start and stop dates in addition to other pertinent events   4/11 severe aortic insufficiency and moderate aortic stenosis status post AVR, extubated post-op  Interim History / Subjective:  She didn't sleep well due to lights and noise. Hypertensive all night, cleviprex restarted eventually.   Objective   Blood pressure (!) 132/91, pulse (!) 110, temperature 98.7 F (37.1 C), temperature source Oral, resp. rate (!) 34, height 5\' 4"  (1.626 m), weight 64.7 kg, SpO2 99%. PAP:  (22-27)/(8-15) 22/8 CO:  [5.6 L/min] 5.6 L/min CI:  [3.26 L/min/m2] 3.26 L/min/m2      Intake/Output Summary (Last 24 hours) at 04/30/2023 0716 Last data filed at 04/30/2023 0401 Gross per 24 hour  Intake 292.66 ml  Output 405 ml  Net -112.34 ml   Filed Weights   04/28/23 0609 04/29/23 0600 04/30/23 0500  Weight: 64.8 kg 65.3 kg 64.7 kg    Examination: General: middle aged woman sitting up in NAD HENT: [place]/AT, eyes anicteric Lungs: breathing comfortably on RA, CTAB Cardiovascular: S1S2, RRR Abdomen: soft, NT Extremities: no edema Neuro: awake, alert, moving extremities, normal speech Derm: warm, dry, no rashes  Na+ 133 BUN 19 Cr 0.95 WBC 18.1 H/H 12.4/ Platelets 170 CXR personally reviewed> no infiltrates  Resolved Hospital Problem list   N/A  Assessment & Plan:  Severe aortic insufficiency and moderate aortic stenosis, status post bioprosthetic AV replacement CAD s/p DES in 2022-on Plavix -post op care per TCTS -aspirin, statin -amiodarone for afib prophylaxis -coreg> may need to increase dose later -needs improved BP control -pain management per protocol -progress mobility and diet as able  Hypertension- uncontrolled -con't coreg 6.25mg , restart PTA Entresto and low dose spiro. Wean off cleviprex.  -hydralazine PRN, adding labetalol IV for SBP >130 -restart spironolactone as able  HLD -PTA repatha on hold while in the hospital -con't statin  Expected post-operative ABLA, stable -no current need for transfusions  Polysubstance abuse disorder -Heroin/cocaine -recommend cessation; higher doses of pain meds due to tolerance  Hyperglycemia -SSI PRN -goal BG 140-180  Hyponatremia -avoid hypotonic fluids  Insomnia -melatonin and  trazodone at bedtime, seroquel PRN   Best Practice (right click and "Reselect all SmartList Selections" daily)   Diet/type: Regular consistency (see orders) DVT prophylaxis LMWH Pressure ulcer(s): N/A GI prophylaxis:  PPI Lines: Central line Foley:  N/A Code Status:  full code Last date of multidisciplinary goals of care discussion [per primary]   Labs   CBC: Recent Labs  Lab 04/28/23 1723 04/29/23 0401 04/29/23 0639 04/29/23 1712 04/30/23 0458  WBC 19.4* 15.6* 15.6* 18.2* 18.1*  HGB 12.0 12.4 12.6 12.8 12.4  HCT 32.4* 33.2* 34.0* 34.1* RESULTS UNAVAILABLE DUE TO INTERFERING SUBSTANCE  MCV 83.1 82.6 81.7 83.6 RESULTS UNAVAILABLE DUE TO INTERFERING SUBSTANCE  PLT 175 154 159 168 170    Basic Metabolic Panel: Recent Labs  Lab 04/26/23 1230 04/28/23 0800 04/28/23 1014 04/28/23 1017 04/28/23 1534 04/28/23 1723 04/29/23 0401 04/29/23 1712 04/30/23 0458  NA 138   < > 136   < > 136 133* 129* 128* 133*  K 4.7   < > 5.7*   < > 4.6 5.4* 3.8 3.5 4.1  CL 102   < > 102  --   --  103 97* 96* 100  CO2 29  --   --   --   --  21* 21* 22 23  GLUCOSE 107*   < > 200*  --   --  111* 115* 149* 145*  BUN 15   < > 16  --   --  13 10 19 19   CREATININE 0.98   < > 0.80  --   --  0.76 0.73 1.12* 0.95  CALCIUM 9.9  --   --   --   --  7.6* 8.0* 8.2* 8.6*  MG  --   --   --   --   --  3.3* 2.3 2.1  --    < > = values in this interval not displayed.   GFR: Estimated Creatinine Clearance: 55.7 mL/min (by C-G formula based on SCr of 0.95 mg/dL). Recent Labs  Lab 04/29/23 0401 04/29/23 0639 04/29/23 1712 04/30/23 0458  WBC 15.6* 15.6* 18.2* 18.1*     Critical care time:      This patient is critically ill with multiple organ system failure which requires frequent high complexity decision making, assessment, support, evaluation, and titration of therapies. This was completed through the application of advanced monitoring technologies and extensive interpretation of multiple databases. During this encounter critical care time was devoted to patient care services described in this note for 35 minutes.   Joesph Mussel, DO 04/30/23 9:24 AM [facility]  For contact information, see  Amion. If no response to pager, please call PCCM consult pager. After hours, 7PM- 7AM, please call Elink.

## 2023-04-30 NOTE — Progress Notes (Signed)
 BP 107/77 now.

## 2023-05-01 ENCOUNTER — Encounter (HOSPITAL_COMMUNITY): Payer: Self-pay | Admitting: Thoracic Surgery (Cardiothoracic Vascular Surgery)

## 2023-05-01 LAB — GLUCOSE, CAPILLARY
Glucose-Capillary: 95 mg/dL (ref 70–99)
Glucose-Capillary: 98 mg/dL (ref 70–99)

## 2023-05-01 LAB — CBC
HCT: 26.6 % — ABNORMAL LOW (ref 36.0–46.0)
Hemoglobin: 9.8 g/dL — ABNORMAL LOW (ref 12.0–15.0)
MCH: 29.8 pg (ref 26.0–34.0)
MCHC: 36.8 g/dL — ABNORMAL HIGH (ref 30.0–36.0)
MCV: 80.9 fL (ref 80.0–100.0)
Platelets: 133 10*3/uL — ABNORMAL LOW (ref 150–400)
RBC: 3.29 MIL/uL — ABNORMAL LOW (ref 3.87–5.11)
RDW: 12.5 % (ref 11.5–15.5)
WBC: 12.6 10*3/uL — ABNORMAL HIGH (ref 4.0–10.5)
nRBC: 0 % (ref 0.0–0.2)

## 2023-05-01 MED ORDER — TRAMADOL HCL 50 MG PO TABS: 50.0000 mg | ORAL_TABLET | ORAL | Status: DC | PRN

## 2023-05-01 MED FILL — Lidocaine HCl Local Preservative Free (PF) Inj 2%: INTRAMUSCULAR | Qty: 14 | Status: AC

## 2023-05-01 MED FILL — Potassium Chloride Inj 2 mEq/ML: INTRAVENOUS | Qty: 40 | Status: AC

## 2023-05-01 MED FILL — Heparin Sodium (Porcine) Inj 1000 Unit/ML: Qty: 1000 | Status: AC

## 2023-05-01 NOTE — Progress Notes (Addendum)
      [address]       [place] [address]             [phone]        3 Days Post-Op Procedure(s) (LRB): AORTIC VALVE REPLACEMENT USING INSPIRIS RESILIA AORTIC VALVE SIZE (N/A) ECHOCARDIOGRAM, TRANSESOPHAGEAL, INTRAOPERATIVE (N/A)  Subjective: She had hypotension last evening (asymptomatic). She walked this weekend. She wants to order breakfast this am.  Objective: Vital signs in last 24 hours: Temp:  [97.7 F (36.5 C)-98.9 F (37.2 C)] 98.7 F (37.1 C) (04/14 0402) Pulse Rate:  [84-124] 97 (04/14 0402) Cardiac Rhythm: Normal sinus rhythm;Heart block (04/13 2100) Resp:  [14-44] 16 (04/14 0402) BP: (79-160)/(54-136) 134/85 (04/14 0404) SpO2:  [96 %-100 %] 98 % (04/14 0402) Weight:  [64.1 kg] 64.1 kg (04/14 0359)  Pre op weight 64.8 kg Current Weight  05/01/23 64.1 kg      Intake/Output from previous day: 04/13 0701 - 04/14 0700 In: 170.9 [P.O.:120; I.V.:6.1; IV Piggyback:44.8] Out: 445 [Urine:425; Chest Tube:20]   Physical Exam:  Cardiovascular: Tachycardic Pulmonary: Clear to auscultation bilaterally Abdomen: Soft, non tender, bowel sounds present. Extremities: No lower extremity edema. Wounds: Aquacel removed and sternal wound is clean and dry.  No erythema or signs of infection. No drainage from chest tube wounds  Lab Results: CBC: Recent Labs    04/29/23 1712 04/30/23 0458  WBC 18.2* 18.1*  HGB 12.8 12.4  HCT 34.1* RESULTS UNAVAILABLE DUE TO INTERFERING SUBSTANCE  PLT 168 170   BMET:  Recent Labs    04/29/23 1712 04/30/23 0458  NA 128* 133*  K 3.5 4.1  CL 96* 100  CO2 22 23  GLUCOSE 149* 145*  BUN 19 19  CREATININE 1.12* 0.95  CALCIUM 8.2* 8.6*    PT/INR:  Lab Results  Component Value Date   INR 1.5 (H) 04/28/2023   INR 1.0 04/26/2023   INR 0.9 06/24/2018   ABG:  INR: Will add last result for INR, ABG once components are confirmed Will add last 4 CBG results once components are  confirmed  Assessment/Plan:  1. CV - SR, first degree heart block. ST, PVCs this am. BP improved so will give Amiodarone and Coreg. On prophylactic Amiodarone 400 mg bid, Coreg 6.25 mg bid. Of note, she was on Plavix prior to surgery (CVA). Will likely restart in next 1-2 days 2.  Pulmonary - On room air. Encourage incentive spirometer 3. Chronic systolic heart failure-on Entresto 24/26 bid and Spironolactone. These were held last evening secondary to hypotension. ? consider Spironolactone once BP remains stable but will stop Entresto for now 4.  Expected post op acute blood loss anemia - Hemoglobin 04/13 12.4. Last H and H stable at 12.8 and 34.1 5. CBGs 131/98/95. Pre op HGA1C 5.5. Stop accu checks and SS PRN. 6. On Lovenox for DVT prophylaxis 7. Disposition-home once BP improved and can determine medications.  Donielle M ZimmermanPA-C 6:55 AM  Agree with above Once HTN meds optimized will be able to be discharged

## 2023-05-01 NOTE — Progress Notes (Signed)
 CARDIAC REHAB PHASE I   PRE:  Rate/Rhythm: 110 ST    BP: sitting 137/97    SpO2: 97 RA  MODE:  Ambulation: 400 ft   POST:  Rate/Rhythm: 128 ST    BP: sitting 118/85     SpO2: 93 RA  Pt moved out of bed with mod assist. Stood independently and ambulated with rollator. Steady, no c/o. HR elevated with movement but seemed asx.   To recliner. IS 500 ml max. Encouraged use and x2 more walks. Pt enjoyed ambulation.  [id number]   Leslie Walker BS, ACSM-CEP 05/01/2023 10:38 AM

## 2023-05-02 ENCOUNTER — Inpatient Hospital Stay (HOSPITAL_COMMUNITY)

## 2023-05-02 ENCOUNTER — Other Ambulatory Visit (HOSPITAL_COMMUNITY): Payer: Self-pay

## 2023-05-02 DIAGNOSIS — I5022 Chronic systolic (congestive) heart failure: Secondary | ICD-10-CM

## 2023-05-02 MED ORDER — ASPIRIN 81 MG PO TBEC
81.0000 mg | DELAYED_RELEASE_TABLET | Freq: Every day | ORAL | Status: DC
Start: 2023-05-02 — End: 2023-05-03
  Administered 2023-05-02 – 2023-05-03 (×2): 81 mg via ORAL
  Filled 2023-05-02 (×2): qty 1

## 2023-05-02 MED ORDER — CLOPIDOGREL BISULFATE 75 MG PO TABS
75.0000 mg | ORAL_TABLET | Freq: Every day | ORAL | Status: DC
  Administered 2023-05-02 – 2023-05-03 (×2): 75 mg via ORAL
  Filled 2023-05-02 (×2): qty 1

## 2023-05-02 MED ORDER — CARVEDILOL 12.5 MG PO TABS
12.5000 mg | ORAL_TABLET | Freq: Two times a day (BID) | ORAL | Status: DC
  Administered 2023-05-02 – 2023-05-03 (×3): 12.5 mg via ORAL
  Filled 2023-05-02 (×3): qty 1

## 2023-05-02 MED ORDER — SPIRONOLACTONE 12.5 MG HALF TABLET: 12.5000 mg | ORAL_TABLET | Freq: Every day | ORAL | Status: DC

## 2023-05-02 MED ORDER — SPIRONOLACTONE 25 MG PO TABS: 25.0000 mg | ORAL_TABLET | Freq: Every day | ORAL | Status: DC

## 2023-05-02 NOTE — Progress Notes (Addendum)
 CARDIAC REHAB PHASE I   PRE:  Rate/Rhythm: 102 ST  BP:  Sitting: 127/93      SpO2: 99 RA  MODE:  Ambulation: 400 ft    POST:  Rate/Rhythm: 116 ST  BP:  Sitting: 112/82      SpO2: 99 RA  Pt ambulated in the hallway w/o AD, pt walked well in the hallway, some lateral swaying seen. Pt did report some blurry vision that resolved after sitting. Pt helped to chair and breakfast ordered prior to leaving.    Service time is from [id number] to 1007.  Revisited at 1240, Pt received OHS book and education on restrictions, heart healthy diet, ex guidelines, Move in the Tube sheet, incentive spirometer use when d/c and CRPII. Pt denies questions and was encouraged to look in the book for additional information. Referral placed to [facility].   Pt states she is interested in CR program.  Barkley Li  MS, ACSM-CEP 05/02/2023    Service time is from 1240-1310

## 2023-05-02 NOTE — Progress Notes (Signed)
 Mobility Specialist Progress Note:    05/02/23 1115  Mobility  Activity Ambulated with assistance in room;Ambulated with assistance to bathroom [facility].)  Level of Assistance Standby assist, set-up cues, supervision of patient - no hands on  Assistive Device Wheelchair  Distance Ambulated (ft) 6 ft  RUE Weight Bearing Per Provider Order NWB  LUE Weight Bearing Per Provider Order NWB  Activity Response Tolerated well  Mobility Referral Yes  Mobility visit 1 Mobility  Mobility Specialist Start Time (ACUTE ONLY) 1107  Mobility Specialist Stop Time (ACUTE ONLY) 1115  Mobility Specialist Time Calculation (min) (ACUTE ONLY) 8 min   Pt received in bathroom. Transport arrived to transport to [employer]. Assisted pt from commode to [facility]. SBA for safety, no AD required. Left pt in care of transport, all needs met.    Deneene Tarver Mobility Specialist Please contact via Special educational needs teacher or  Rehab office at [phone]

## 2023-05-02 NOTE — Progress Notes (Addendum)
      [address]       [place] [address]             [phone]        4 Days Post-Op Procedure(s) (LRB): AORTIC VALVE REPLACEMENT USING INSPIRIS RESILIA AORTIC VALVE SIZE (N/A) ECHOCARDIOGRAM, TRANSESOPHAGEAL, INTRAOPERATIVE (N/A)  Subjective: She is just waking up this am. She walked yesterday. She has no specific complaint this am.  Objective: Vital signs in last 24 hours: Temp:  [97.9 F (36.6 C)-99 F (37.2 C)] (P) 98.5 F (36.9 C) (04/14 2302) Pulse Rate:  [89-109] 100 (04/15 0300) Cardiac Rhythm: Normal sinus rhythm (04/14 1900) Resp:  [16-22] 16 (04/15 0300) BP: (123-137)/(81-101) 137/101 (04/15 0300) SpO2:  [96 %-100 %] 96 % (04/15 0300)  Pre op weight 64.8 kg Current Weight  05/01/23 64.1 kg     Intake/Output from previous day: 04/14 0701 - 04/15 0700 In: -  Out: 600 [Urine:600]   Physical Exam:  Cardiovascular: Tachycardic Pulmonary: Clear to auscultation bilaterally Abdomen: Soft, non tender, bowel sounds present. Extremities: No lower extremity edema Wounds: Sternal wound is clean and dry.  No erythema or signs of infection. No drainage from chest tube wounds  Lab Results: CBC: Recent Labs    04/30/23 0458 05/01/23 0913  WBC 18.1* 12.6*  HGB 12.4 9.8*  HCT RESULTS UNAVAILABLE DUE TO INTERFERING SUBSTANCE 26.6*  PLT 170 133*   BMET:  Recent Labs    04/29/23 1712 04/30/23 0458  NA 128* 133*  K 3.5 4.1  CL 96* 100  CO2 22 23  GLUCOSE 149* 145*  BUN 19 19  CREATININE 1.12* 0.95  CALCIUM 8.2* 8.6*    PT/INR:  Lab Results  Component Value Date   INR 1.5 (H) 04/28/2023   INR 1.0 04/26/2023   INR 0.9 06/24/2018   ABG:  INR: Will add last result for INR, ABG once components are confirmed Will add last 4 CBG results once components are confirmed  Assessment/Plan:  1. CV - SR, first degree heart block. ST, PVCs this am. On prophylactic Amiodarone 400 mg bid, Coreg 6.25 mg bid. ? If should increase  Coreg. Of note, she was on Plavix prior to surgery (CVA). Will likely restart in next 1-2 days 2.  Pulmonary - On room air. Encourage incentive spirometer 3. Chronic systolic heart failure-on Entresto 24/26 bid and Spironolactone. Both stopped secondary to hypotension. BP improved and diastolic above 100. Will restart Spironolactone 4.  Expected post op acute blood loss anemia - H and H yesterday decreased to 9.8 and 26.6 5. Mild thrombocytopenia-platelets 133,000 6. On Lovenox for DVT prophylaxis 7. Disposition-home once BP improved and can determine medications.  Donielle M ZimmermanPA-C 6:51 AM  Agree with above Increase coreg Start plavix

## 2023-05-03 ENCOUNTER — Other Ambulatory Visit (HOSPITAL_COMMUNITY): Payer: Self-pay

## 2023-05-03 MED ORDER — FUROSEMIDE 40 MG PO TABS: 40.0000 mg | ORAL_TABLET | Freq: Once | ORAL | Status: DC

## 2023-05-03 MED ORDER — OXYCODONE HCL 10 MG PO TABS
10.0000 mg | ORAL_TABLET | Freq: Four times a day (QID) | ORAL | 0 refills | Status: DC | PRN
  Filled 2023-05-03: qty 20, 5d supply, fill #0

## 2023-05-03 MED ORDER — SPIRONOLACTONE 25 MG PO TABS
12.5000 mg | ORAL_TABLET | Freq: Every day | ORAL | 1 refills | Status: DC
  Filled 2023-05-03 – 2023-07-07 (×2): qty 30, 60d supply, fill #0

## 2023-05-03 MED ORDER — SPIRONOLACTONE 25 MG PO TABS: 25.0000 mg | ORAL_TABLET | Freq: Every day | ORAL | Status: DC

## 2023-05-03 MED ORDER — SPIRONOLACTONE 12.5 MG HALF TABLET
12.5000 mg | ORAL_TABLET | Freq: Every day | ORAL | Status: DC
  Administered 2023-05-03: 12.5 mg via ORAL
  Filled 2023-05-03: qty 1

## 2023-05-03 MED ORDER — CARVEDILOL 12.5 MG PO TABS
12.5000 mg | ORAL_TABLET | Freq: Two times a day (BID) | ORAL | 1 refills | Status: DC
  Filled 2023-05-03: qty 60, 30d supply, fill #0
  Filled 2023-05-25: qty 60, 30d supply, fill #1

## 2023-05-03 MED ORDER — ASPIRIN 81 MG PO TBEC: 81.0000 mg | DELAYED_RELEASE_TABLET | Freq: Every day | ORAL | Status: DC

## 2023-05-03 MED ORDER — POTASSIUM CHLORIDE CRYS ER 20 MEQ PO TBCR: 20.0000 meq | EXTENDED_RELEASE_TABLET | Freq: Once | ORAL | Status: DC

## 2023-05-03 MED FILL — Sodium Bicarbonate IV Soln 8.4%: INTRAVENOUS | Qty: 50 | Status: AC

## 2023-05-03 MED FILL — Electrolyte-R (PH 7.4) Solution: INTRAVENOUS | Qty: 4000 | Status: AC

## 2023-05-03 MED FILL — Heparin Sodium (Porcine) Inj 1000 Unit/ML: INTRAMUSCULAR | Qty: 10 | Status: AC

## 2023-05-03 MED FILL — Sodium Chloride IV Soln 0.9%: INTRAVENOUS | Qty: 2000 | Status: AC

## 2023-05-03 NOTE — Progress Notes (Signed)
 Mobility Specialist Progress Note:    05/03/23 0924  Mobility  Activity Ambulated independently in hallway;Ambulated independently in room  Level of Assistance Independent  Assistive Device None  Distance Ambulated (ft) 360 ft  RUE Weight Bearing Per Provider Order NWB  LUE Weight Bearing Per Provider Order NWB  Activity Response Tolerated well  Mobility Referral Yes  Mobility visit 1 Mobility  Mobility Specialist Start Time (ACUTE ONLY) [id number]  Mobility Specialist Stop Time (ACUTE ONLY) 0924  Mobility Specialist Time Calculation (min) (ACUTE ONLY) 6 min   Pt received in bed, agreeable to mobility session. Ambulated in hallway, no AD required. Tolerated well, max HR 105 bpm. Returned pt to room, lying down in bed in with all needs met.    Sadi Arave Mobility Specialist Please contact via Special educational needs teacher or  Rehab office at [phone]

## 2023-05-03 NOTE — Progress Notes (Addendum)
      [address]       [place] [address]             [phone]        5 Days Post-Op Procedure(s) (LRB): AORTIC VALVE REPLACEMENT USING INSPIRIS RESILIA AORTIC VALVE SIZE (N/A) ECHOCARDIOGRAM, TRANSESOPHAGEAL, INTRAOPERATIVE (N/A)  Subjective: She is just waking up this am. She states her stomach feels "funny" this am. She denies abdominal pain, nausea, is passing flatus and has had bowel movements.  Objective: Vital signs in last 24 hours: Temp:  [98.2 F (36.8 C)-98.8 F (37.1 C)] 98.8 F (37.1 C) (04/16 0352) Pulse Rate:  [88-100] 88 (04/15 2007) Cardiac Rhythm: Normal sinus rhythm (04/15 1940) Resp:  [16-22] 16 (04/16 0352) BP: (90-150)/(73-98) 150/94 (04/16 0352) SpO2:  [97 %-100 %] 98 % (04/16 0352)  Pre op weight 64.8 kg Current Weight  05/01/23 64.1 kg     Intake/Output from previous day: No intake/output data recorded.   Physical Exam:  Cardiovascular: RRR Pulmonary: Clear to auscultation bilaterally Abdomen: Soft, non tender, bowel sounds present. Extremities: No lower extremity edema Wounds: Sternal wound is clean and dry.  No erythema or signs of infection. No drainage from chest tube wounds  Lab Results: CBC: Recent Labs    05/01/23 0913  WBC 12.6*  HGB 9.8*  HCT 26.6*  PLT 133*   BMET:  No results for input(s): "NA", "K", "CL", "CO2", "GLUCOSE", "BUN", "CREATININE", "CALCIUM" in the last 72 hours.   PT/INR:  Lab Results  Component Value Date   INR 1.5 (H) 04/28/2023   INR 1.0 04/26/2023   INR 0.9 06/24/2018   ABG:  INR: Will add last result for INR, ABG once components are confirmed Will add last 4 CBG results once components are confirmed  Assessment/Plan:  1. CV - SR, first degree heart block. ST, PVCs this am. On prophylactic Amiodarone 400 mg bid, Coreg 12.5 mg bid. She was restarted on Plavix yesterday  (history of CVA).  2.  Pulmonary - On room air. Encourage incentive spirometer 3. Chronic  systolic heart failure-on Entresto 24/26 bid and Spironolactone. Both stopped secondary to hypotension. SBP overnight in 90's and sometimes less but 150 this am. As discussed with Dr. Honey Lusty, will restart low dose Spironolactone. She will follow up with [facility].  4.  Expected post op acute blood loss anemia - Last H and H  decreased to 9.8 and 26.6 5. Mild thrombocytopenia-Last platelets 133,000 6. On Lovenox for DVT prophylaxis 7. Disposition-discharge home later today  Joanell Mowers ZimmermanPA-C 6:55 AM

## 2023-05-03 NOTE — Plan of Care (Signed)
   Problem: Education: Goal: Knowledge of General Education information will improve Description: Including pain rating scale, medication(s)/side effects and non-pharmacologic comfort measures Outcome: Progressing   Problem: Health Behavior/Discharge Planning: Goal: Ability to manage health-related needs will improve Outcome: Progressing   Problem: Clinical Measurements: Goal: Diagnostic test results will improve Outcome: Progressing

## 2023-05-03 NOTE — TOC Transition Note (Signed)
 Transition of Care (TOC) - Discharge Note Sherin Dingwall RN, BSN Transitions of Care Unit 4E- RN Case Manager See Treatment Team for direct phone #   Patient Details  Name: Leslie Walker MRN: [id number] Date of Birth: 03/16/1965  [facility]) CM/SW Contact:  Rox Cope, RN Phone Number: 05/03/2023, 12:18 PM   Clinical Narrative:    Stable for transition home today, no HH or DME needs noted.  Pt to follow up as per AVS instructions.    Final next level of care: Home/Self Care Barriers to Discharge: No Barriers Identified   Patient Goals and CMS Choice Patient states their goals for this hospitalization and ongoing recovery are:: return home   Choice offered to / list presented to : NA      Discharge Placement               Home        Discharge Plan and Services Additional resources added to the After Visit Summary for   In-house Referral: NA Discharge Planning Services: NA Post Acute Care Choice: NA          DME Arranged: N/A DME Agency: NA       HH Arranged: NA HH Agency: NA        Social Drivers of Health (SDOH) Interventions SDOH Screenings   Food Insecurity: No Food Insecurity (12/14/2021)  Housing: Low Risk  (11/19/2021)  Transportation Needs: No Transportation Needs (12/14/2021)  Utilities: Not At Risk (11/19/2021)  Depression (PHQ2-9): Low Risk  (12/23/2021)  Financial Resource Strain: High Risk (12/14/2021)  Physical Activity: Inactive (02/21/2017)  Social Connections: Somewhat Isolated (02/21/2017)  Stress: No Stress Concern Present (02/21/2017)  Tobacco Use: High Risk (04/28/2023)     Readmission Risk Interventions     No data to display

## 2023-05-04 ENCOUNTER — Other Ambulatory Visit (HOSPITAL_COMMUNITY): Payer: Self-pay | Admitting: Emergency Medicine

## 2023-05-04 ENCOUNTER — Other Ambulatory Visit: Payer: Self-pay

## 2023-05-04 ENCOUNTER — Telehealth: Payer: Self-pay

## 2023-05-04 NOTE — Progress Notes (Signed)
 Paramedicine Encounter    Patient ID: Leslie Walker, female    DOB: [birth year], 58 y.o.   MRN: [id number]   Complaints NONE  Assessment A&O x 4,skin W&D w/ good color.  Denies chest pain or SOB.  Lung sounds clear and equal bilat. No peripheral edema.  BP 80/60.  Denies dizziness or weakness.  Compliance with meds YES  Pill box filled x 2 weeks  Refills neededzetimibe, atorvastatin, jardiance - called into pharmacy   Meds changes since last visit Started 81mg . ASA, Spironolactone 12.5mg . daily, D/C Entresto   Social changes NONE   BP (!) 80/60 (BP Location: Left Arm, Patient Position: Sitting, Cuff Size: Normal)   Pulse 94   Resp 16   Wt 138 lb (62.6 kg)   LMP  (LMP Unknown) Comment: Pt unsure why she no longer gets her period  SpO2 99%   BMI 23.69 kg/m  Weight yesterday-not taken Last visit weight-143lb    ACTION: Home visit completed  [name] [phone] 05/04/23  Patient Care Team: Hoy Register, MD as PCP - General (Family Medicine) Rennis Golden Lisette Abu, MD as PCP - Cardiology (Cardiology)  Patient Active Problem List   Diagnosis Date Noted  . Severe aortic regurgitation 04/28/2023  . Nonrheumatic aortic valve insufficiency 04/19/2022  . Coronary artery disease 11/20/2021  . Elevated troponin 11/19/2021  . Heart failure (HCC) 11/19/2021  . Acute on chronic systolic heart failure (HCC) 11/18/2021  . Chronic systolic heart failure (HCC)   . Chest pain, precordial 10/09/2020  . Cocaine use 10/09/2020  . NSTEMI (non-ST elevated myocardial infarction) (HCC) 10/09/2020  . Polysubstance abuse (HCC) 10/09/2020  . Hyperlipidemia 10/09/2020  . Lung nodule 10/09/2020  . Tobacco dependence 10/09/2020  . Screening breast examination 09/04/2018  . Heroin dependence (HCC) 06/29/2018  . TIA (transient ischemic attack) 06/24/2018  . Nail fungus 04/20/2015  . Porokeratosis 04/20/2015  . History of ulceration 04/20/2015  . Foot ulcer (HCC)  02/17/2014  . Cellulitis of foot, right 02/17/2014  . Chronic pain syndrome 02/17/2014  . Essential hypertension 02/17/2014    Current Outpatient Medications:  .  aspirin EC 81 MG tablet, Take 1 tablet (81 mg total) by mouth daily. Swallow whole., Disp: , Rfl:  .  atorvastatin (LIPITOR) 40 MG tablet, Take 1 tablet (40 mg total) by mouth daily at 6 PM., Disp: 90 tablet, Rfl: 3 .  carvedilol (COREG) 12.5 MG tablet, Take 1 tablet (12.5 mg total) by mouth 2 (two) times daily with a meal., Disp: 60 tablet, Rfl: 1 .  clopidogrel (PLAVIX) 75 MG tablet, Take 1 tablet (75 mg total) by mouth daily., Disp: 90 tablet, Rfl: 3 .  empagliflozin (JARDIANCE) 10 MG TABS tablet, Take 1 tablet (10 mg total) by mouth daily., Disp: 30 tablet, Rfl: 3 .  Evolocumab (REPATHA SURECLICK) 140 MG/ML SOAJ, Inject 140 mg into the skin every 14 (fourteen) days., Disp: 6 mL, Rfl: 3 .  ezetimibe (ZETIA) 10 MG tablet, Take 1 tablet (10 mg total) by mouth daily., Disp: 90 tablet, Rfl: 3 .  Oxycodone HCl 10 MG TABS, Take 1 tablet (10 mg total) by mouth every 6 (six) hours as needed for severe pain (pain score 7-10)., Disp: 20 tablet, Rfl: 0 .  spironolactone (ALDACTONE) 25 MG tablet, Take 0.5 tablets (12.5 mg total) by mouth daily., Disp: 30 tablet, Rfl: 1 No Known Allergies   Social History   Socioeconomic History  . Marital status: Single    Spouse name: Not on file  . Number of  children: 3  . Years of education: Not on file  . Highest education level: 12th grade  Occupational History  . Not on file  Tobacco Use  . Smoking status: Some Days    Current packs/day: 1.00    Types: Cigars, Cigarettes  . Smokeless tobacco: Never  . Tobacco comments:    A cigarette or black n mild "once in a while"  Vaping Use  . Vaping status: Never Used  Substance and Sexual Activity  . Alcohol use: Not Currently    Comment: social drinking  . Drug use: Not Currently    Types: Cocaine, Marijuana    Comment: no use in about 1 year  (04/26/23)  . Sexual activity: Not Currently    Birth control/protection: Post-menopausal  Other Topics Concern  . Not on file  Social History Narrative  . Not on file   Social Drivers of Health   Financial Resource Strain: High Risk (12/14/2021)   Overall Financial Resource Strain (CARDIA)   . Difficulty of Paying Living Expenses: Hard  Food Insecurity: No Food Insecurity (12/14/2021)   Hunger Vital Sign   . Worried About Programme researcher, broadcasting/film/video in the Last Year: Never true   . Ran Out of Food in the Last Year: Never true  Transportation Needs: No Transportation Needs (12/14/2021)   PRAPARE - Transportation   . Lack of Transportation (Medical): No   . Lack of Transportation (Non-Medical): No  Physical Activity: Inactive (02/21/2017)   Exercise Vital Sign   . Days of Exercise per Week: 0 days   . Minutes of Exercise per Session: 0 min  Stress: No Stress Concern Present (02/21/2017)   [employer] of Occupational Health - Occupational Stress Questionnaire   . Feeling of Stress : Not at all  Social Connections: Somewhat Isolated (02/21/2017)   Social Connection and Isolation Panel [NHANES]   . Frequency of Communication with Friends and Family: More than three times a week   . Frequency of Social Gatherings with Friends and Family: Once a week   . Attends Religious Services: 1 to 4 times per year   . Active Member of Clubs or Organizations: No   . Attends Banker Meetings: Never   . Marital Status: Never married  Intimate Partner Violence: Not At Risk (11/19/2021)   Humiliation, Afraid, Rape, and Kick questionnaire   . Fear of Current or Ex-Partner: No   . Emotionally Abused: No   . Physically Abused: No   . Sexually Abused: No    Physical Exam      Future Appointments  Date Time Provider Department Center  05/09/2023  1:30 PM Joaquin Mulberry, MD [facility] None  05/19/2023 11:30 AM [facility] PA/NP [facility] None  05/30/2023  1:30 PM [facility] [place] PA [facility]   06/14/2023 11:00 AM MC ECHO OP 1 [facility]

## 2023-05-04 NOTE — Transitions of Care (Post Inpatient/ED Visit) (Signed)
   05/04/2023  Name: Leslie Walker MRN: [id number] DOB: 1966/01/14  Today's TOC FU Call Status: Today's TOC FU Call Status:: Successful TOC FU Call Completed TOC FU Call Complete Date: 05/04/23 Patient's Name and Date of Birth confirmed.  Transition Care Management Follow-up Telephone Call Date of Discharge: 05/03/23 Discharge Facility: [facility]) Type of Discharge: Inpatient Admission Primary Inpatient Discharge Diagnosis:: severe aortic regurgitation How have you been since you were released from the hospital?: Better (She stated she is feeling fine.) Any questions or concerns?: Yes Patient Questions/Concerns:: She is intersted in applying for disability.  I explained that I can refer her to [facility] for assistance with submitting an application to [facility] but she did not want to discuss the process today. I Patient Questions/Concerns Addressed:  (She said she will discuss the process of applying for disability when she is at [facility] for her appointment next week)  Items Reviewed: Did you receive and understand the discharge instructions provided?: Yes Medications obtained,verified, and reconciled?: No Medications Not Reviewed Reasons:: Other: (She said she has all of her medication and did not have any questions about the med regime and did not need to review the med list.  She said Ermalinda Hays, EMT met with her today and reviewed the meds and set up her med box) Any new allergies since your discharge?: No Dietary orders reviewed?: Yes Type of Diet Ordered:: heart healthy, low sodium Do you have support at home?: Yes People in Home [RPT]: child(ren), adult Name of Support/Comfort Primary Source: her daughter and son  Medications Reviewed Today: Medications Reviewed Today   Medications were not reviewed in this encounter     Home Care and Equipment/Supplies: Were Home Health Services Ordered?: No Any new equipment or medical supplies ordered?: No  Functional  Questionnaire: Do you need assistance with bathing/showering or dressing?: Yes (her daughter assists as needed) Do you need assistance with meal preparation?: Yes (Her daughter assists as needed) Do you need assistance with eating?: No Do you have difficulty maintaining continence: No Do you need assistance with getting out of bed/getting out of a chair/moving?: No Do you have difficulty managing or taking your medications?: Yes Ermalinda Hays, EMT assists)  Follow up appointments reviewed: PCP Follow-up appointment confirmed?: Yes Date of PCP follow-up appointment?: 05/09/23 Follow-up Provider: [facility] Follow-up appointment confirmed?: Yes Date of Specialist follow-up appointment?: 05/19/23 Follow-Up Specialty Provider:: [facility] and 05/30/2023- [facility] Do you need transportation to your follow-up appointment?: No Do you understand care options if your condition(s) worsen?: Yes-patient verbalized understanding    SIGNATURE Burnett Carson, RN

## 2023-05-05 ENCOUNTER — Other Ambulatory Visit: Payer: Self-pay

## 2023-05-08 ENCOUNTER — Other Ambulatory Visit (HOSPITAL_COMMUNITY): Payer: Self-pay | Admitting: Emergency Medicine

## 2023-05-08 ENCOUNTER — Other Ambulatory Visit: Payer: Self-pay

## 2023-05-08 NOTE — Progress Notes (Signed)
 Paramedicine Encounter    Patient ID: Leslie Walker, female    DOB: 28-Mar-1965, 58 y.o.   MRN: [id number]   Complaints NONE  Assessment A&O x 4, skin W&D w/ good color.  Denies chest pain or SOB.  Lung sounds clear and equal bilat. No peripheral edema noted.  Compliance with meds YES  Pill box filled x 2 weeks  Refills needed NONE  Meds changes since last visit NONE    Social changes NONE    BP 92/60 (BP Location: Left Arm, Patient Position: Sitting, Cuff Size: Normal)   Pulse 82   Wt 139 lb 3.2 oz (63.1 kg)   LMP  (LMP Unknown) Comment: Pt unsure why she no longer gets her period  SpO2 100%   BMI 23.89 kg/m  Weight yesterday- Not taken Last visit weight-138lb  ATF Ms. Whiters lying LLR position in her bed at home.  She denies chest pain or SOB.  Lung sounds clear bilat and no peripheral edema noted.  I delivered her refills for Atrovastatin, Zetia , Jardiance .  Completed med rec from last weeks visit.  Pt compliant with all meds.  She states she feels she is doing well following her recent aortic valve replacement surgery.    ACTION: Home visit completed  Carlton Chick [phone] 05/08/23  Patient Care Team: Joaquin Mulberry, MD as PCP - General (Family Medicine) Hazle Lites, MD as PCP - Cardiology (Cardiology)  Patient Active Problem List   Diagnosis Date Noted   Severe aortic regurgitation 04/28/2023   Nonrheumatic aortic valve insufficiency 04/19/2022   Coronary artery disease 11/20/2021   Elevated troponin 11/19/2021   Heart failure (HCC) 11/19/2021   Acute on chronic systolic heart failure (HCC) 11/18/2021   Chronic systolic heart failure (HCC)    Chest pain, precordial 10/09/2020   Cocaine use 10/09/2020   NSTEMI (non-ST elevated myocardial infarction) (HCC) 10/09/2020   Polysubstance abuse (HCC) 10/09/2020   Hyperlipidemia 10/09/2020   Lung nodule 10/09/2020   Tobacco dependence 10/09/2020   Screening breast examination 09/04/2018    Heroin dependence (HCC) 06/29/2018   TIA (transient ischemic attack) 06/24/2018   Nail fungus 04/20/2015   Porokeratosis 04/20/2015   History of ulceration 04/20/2015   Foot ulcer (HCC) 02/17/2014   Cellulitis of foot, right 02/17/2014   Chronic pain syndrome 02/17/2014   Essential hypertension 02/17/2014    Current Outpatient Medications:    aspirin  EC 81 MG tablet, Take 1 tablet (81 mg total) by mouth daily. Swallow whole., Disp: , Rfl:    atorvastatin  (LIPITOR) 40 MG tablet, Take 1 tablet (40 mg total) by mouth daily at 6 PM., Disp: 90 tablet, Rfl: 3   carvedilol  (COREG ) 12.5 MG tablet, Take 1 tablet (12.5 mg total) by mouth 2 (two) times daily with a meal., Disp: 60 tablet, Rfl: 1   clopidogrel  (PLAVIX ) 75 MG tablet, Take 1 tablet (75 mg total) by mouth daily., Disp: 90 tablet, Rfl: 3   empagliflozin  (JARDIANCE ) 10 MG TABS tablet, Take 1 tablet (10 mg total) by mouth daily., Disp: 30 tablet, Rfl: 3   Evolocumab  (REPATHA  SURECLICK) 140 MG/ML SOAJ, Inject 140 mg into the skin every 14 (fourteen) days., Disp: 6 mL, Rfl: 3   ezetimibe  (ZETIA ) 10 MG tablet, Take 1 tablet (10 mg total) by mouth daily., Disp: 90 tablet, Rfl: 3   Oxycodone  HCl 10 MG TABS, Take 1 tablet (10 mg total) by mouth every 6 (six) hours as needed for severe pain (pain score 7-10)., Disp: 20 tablet, Rfl: 0  spironolactone  (ALDACTONE ) 25 MG tablet, Take 0.5 tablets (12.5 mg total) by mouth daily., Disp: 30 tablet, Rfl: 1 No Known Allergies   Social History   Socioeconomic History   Marital status: Single    Spouse name: Not on file   Number of children: 3   Years of education: Not on file   Highest education level: 12th grade  Occupational History   Not on file  Tobacco Use   Smoking status: Some Days    Current packs/day: 1.00    Types: Cigars, Cigarettes   Smokeless tobacco: Never   Tobacco comments:    A cigarette or black n mild "once in a while"  Vaping Use   Vaping status: Never Used  Substance and  Sexual Activity   Alcohol use: Not Currently    Comment: social drinking   Drug use: Not Currently    Types: Cocaine, Marijuana    Comment: no use in about 1 year (04/26/23)   Sexual activity: Not Currently    Birth control/protection: Post-menopausal  Other Topics Concern   Not on file  Social History Narrative   Not on file   Social Drivers of Health   Financial Resource Strain: High Risk (12/14/2021)   Overall Financial Resource Strain (CARDIA)    Difficulty of Paying Living Expenses: Hard  Food Insecurity: No Food Insecurity (12/14/2021)   Hunger Vital Sign    Worried About Running Out of Food in the Last Year: Never true    Ran Out of Food in the Last Year: Never true  Transportation Needs: No Transportation Needs (12/14/2021)   PRAPARE - Administrator, Civil Service (Medical): No    Lack of Transportation (Non-Medical): No  Physical Activity: Inactive (02/21/2017)   Exercise Vital Sign    Days of Exercise per Week: 0 days    Minutes of Exercise per Session: 0 min  Stress: No Stress Concern Present (02/21/2017)   [employer] of Occupational Health - Occupational Stress Questionnaire    Feeling of Stress : Not at all  Social Connections: Somewhat Isolated (02/21/2017)   Social Connection and Isolation Panel [NHANES]    Frequency of Communication with Friends and Family: More than three times a week    Frequency of Social Gatherings with Friends and Family: Once a week    Attends Religious Services: 1 to 4 times per year    Active Member of [employer] or Organizations: No    Attends Banker Meetings: Never    Marital Status: Never married  Intimate Partner Violence: Not At Risk (11/19/2021)   Humiliation, Afraid, Rape, and Kick questionnaire    Fear of Current or Ex-Partner: No    Emotionally Abused: No    Physically Abused: No    Sexually Abused: No    Physical Exam      Future Appointments  Date Time Provider Department Center  05/09/2023   1:30 PM Joaquin Mulberry, MD [facility] None  05/19/2023 11:30 AM [facility] PA/NP [facility] None  05/30/2023  1:30 PM [facility] [place] [facility]  06/14/2023 11:00 AM MC ECHO OP 1 [facility]

## 2023-05-09 ENCOUNTER — Encounter: Payer: Self-pay | Admitting: Family Medicine

## 2023-05-09 ENCOUNTER — Ambulatory Visit: Payer: Medicaid Other | Attending: Family Medicine | Admitting: Family Medicine

## 2023-05-09 ENCOUNTER — Other Ambulatory Visit: Payer: Self-pay

## 2023-05-09 VITALS — BP 99/67 | HR 77 | Ht 64.0 in | Wt 140.0 lb

## 2023-05-09 DIAGNOSIS — Z952 Presence of prosthetic heart valve: Secondary | ICD-10-CM | POA: Diagnosis not present

## 2023-05-09 DIAGNOSIS — Z23 Encounter for immunization: Secondary | ICD-10-CM

## 2023-05-09 DIAGNOSIS — I5040 Unspecified combined systolic (congestive) and diastolic (congestive) heart failure: Secondary | ICD-10-CM | POA: Diagnosis not present

## 2023-05-09 DIAGNOSIS — I110 Hypertensive heart disease with heart failure: Secondary | ICD-10-CM

## 2023-05-09 DIAGNOSIS — Z48812 Encounter for surgical aftercare following surgery on the circulatory system: Secondary | ICD-10-CM | POA: Diagnosis not present

## 2023-05-09 DIAGNOSIS — I10 Essential (primary) hypertension: Secondary | ICD-10-CM

## 2023-05-09 DIAGNOSIS — G4709 Other insomnia: Secondary | ICD-10-CM | POA: Diagnosis not present

## 2023-05-09 DIAGNOSIS — L84 Corns and callosities: Secondary | ICD-10-CM

## 2023-05-09 MED ORDER — HYDROXYZINE HCL 25 MG PO TABS
25.0000 mg | ORAL_TABLET | Freq: Every evening | ORAL | 1 refills | Status: AC | PRN
  Filled 2023-05-09: qty 30, 30d supply, fill #0

## 2023-05-09 NOTE — Patient Instructions (Signed)
 VISIT SUMMARY:  You came in today with concerns about post-operative pain and difficulty sleeping following your recent aortic valve replacement surgery. You also mentioned a callus on your foot and concerns about your toenails. We discussed your current symptoms and made a plan to address each issue.  YOUR PLAN:  -AORTIC VALVE REPLACEMENT: You are recovering from your recent aortic valve replacement surgery. You mentioned experiencing tightness and difficulty sleeping, which are likely related to your recovery. Your pain is being managed, and you have no shortness of breath or swelling. Your appetite is normal. You should follow up with your cardiothoracic surgeon in May and continue with your scheduled cardiologist appointments.  -DIFFICULTY SLEEPING: You are experiencing new onset insomnia, likely due to anxiety and worry related to your recent surgery. Insomnia means having trouble falling or staying asleep. To help with this, you have been prescribed hydroxyzine  to take once daily at bedtime. You should also avoid caffeine after noon and avoid taking daytime naps.  -HYPERTENSION: Your blood pressure is currently low but you are not experiencing any symptoms like lightheadedness. Hypertension means high blood pressure, but in your case, it is currently low and not causing any issues.  -SMOKING CESSATION: You quit smoking two weeks ago in preparation for your surgery and have not resumed smoking. This is excellent progress and beneficial for your overall health.  -FOOT CALLUS: You have a callus on your foot that needs to be evaluated by a podiatrist. A callus is a thickened and hardened part of the skin that can cause discomfort. You will be referred to a podiatrist for further evaluation and management.  INSTRUCTIONS:  Please follow up with your cardiothoracic surgeon in May and continue with your scheduled cardiologist appointments. Additionally, you will be referred to a podiatrist for your  foot callus. Take hydroxyzine  once daily at bedtime to help with sleep, avoid caffeine after noon, and avoid daytime naps.

## 2023-05-09 NOTE — Progress Notes (Signed)
 Subjective:  Patient ID: Leslie Walker, female    DOB: [birth year]  Age: 58 y.o. MRN: [id number]  CC: Hospitalization Follow-up (Not sleeping)     Discussed the use of AI scribe software for clinical note transcription with the patient, who gave verbal consent to proceed.  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 She underwent recent aortic valve replacement on 04/28/2023 and during her postop.  Was placed on Cleviprex  drip.  Entresto  was discontinued during her hospital course. She is yet to see her cardiothoracic surgeon or her cardiologist for follow-up.    She describes pain around her incision site as 'tight' and reports difficulty with reaching and sleeping. She has been prescribed pain medication for this. She also reports new onset insomnia, stating that she is unable to sleep for more than a few hours at a time. This is a change from her previous sleep pattern, which was regular and uninterrupted. She attributes this change to anxiety and worry related to her recent surgery and current health status.  The patient also reports a history of smoking, but quit two weeks ago in preparation for her surgery. She has not resumed smoking post-operatively. She also reports a callus on her foot and is concerned about her toenails. She has not seen a podiatrist for these issues.      Past Medical History:  Diagnosis Date   Chronic systolic heart failure (HCC)    Cocaine abuse (HCC)    last use about 1 year ago (04/26/23)   Coronary artery disease    Dyslipidemia    Hypertension    Noncompliance w/medication treatment due to  intermit use of medication    Stroke (HCC) 2021   no deficits    Past Surgical History:  Procedure Laterality Date   AORTIC VALVE REPLACEMENT N/A 04/28/2023   Procedure: AORTIC VALVE REPLACEMENT USING INSPIRIS RESILIA AORTIC VALVE SIZE ;  Surgeon: Melene Sportsman, MD;  Location: [facility] OR;  Service: Open Heart Surgery;  Laterality: N/A;   CORONARY PRESSURE/FFR STUDY N/A 10/09/2020   Procedure: INTRAVASCULAR PRESSURE WIRE/FFR STUDY;  Surgeon: Arty Binning, MD;  Location: [facility] INVASIVE CV LAB;  Service: Cardiovascular;  Laterality: N/A;   CORONARY STENT INTERVENTION N/A 10/09/2020   Procedure: CORONARY STENT INTERVENTION;  Surgeon: Arty Binning, MD;  Location: [facility] INVASIVE CV LAB;  Service: Cardiovascular;  Laterality: N/A;   INTRAOPERATIVE TRANSESOPHAGEAL ECHOCARDIOGRAM N/A 04/28/2023   Procedure: ECHOCARDIOGRAM, TRANSESOPHAGEAL, INTRAOPERATIVE;  Surgeon: Melene Sportsman, MD;  Location: [facility] OR;  Service: Open Heart Surgery;  Laterality: N/A;   LEFT HEART CATH AND CORONARY ANGIOGRAPHY N/A 10/09/2020   Procedure: LEFT HEART CATH AND CORONARY ANGIOGRAPHY;  Surgeon: Arty Binning, MD;  Location: [facility] INVASIVE CV LAB;  Service: Cardiovascular;  Laterality: N/A;   LOOP RECORDER INSERTION N/A 06/27/2018   Procedure: LOOP RECORDER INSERTION;  Surgeon: Lei Pump, MD;  Location: [facility] INVASIVE CV LAB;  Service: Cardiovascular;  Laterality: N/A;   RIGHT HEART CATH AND CORONARY ANGIOGRAPHY N/A 04/05/2023   Procedure: RIGHT HEART CATH AND CORONARY ANGIOGRAPHY;  Surgeon: Darlis Eisenmenger, MD;  Location: [facility] INVASIVE CV LAB;  Service: Cardiovascular;  Laterality: N/A;   RIGHT/LEFT HEART CATH AND  CORONARY ANGIOGRAPHY N/A 11/22/2021   Procedure: RIGHT/LEFT HEART CATH AND CORONARY ANGIOGRAPHY;  Surgeon: Darlis Eisenmenger, MD;  Location: [facility] INVASIVE CV LAB;  Service: Cardiovascular;  Laterality: N/A;   TEE WITHOUT CARDIOVERSION N/A 11/23/2021   Procedure: TRANSESOPHAGEAL ECHOCARDIOGRAM (TEE);  Surgeon: Darlis Eisenmenger,  MD;  Location: [facility] ENDOSCOPY;  Service: Cardiovascular;  Laterality: N/A;   TEE WITHOUT CARDIOVERSION N/A 04/19/2022   Procedure: TRANSESOPHAGEAL ECHOCARDIOGRAM (TEE);  Surgeon: Darlis Eisenmenger, MD;  Location: [facility] ENDOSCOPY;  Service: Cardiovascular;  Laterality: N/A;    Family History  Problem Relation Age of Onset   Hypertension Mother    Hypertension Father    Hypertension Brother    Stroke Sister     Social History   Socioeconomic History   Marital status: Single    Spouse name: Not on file   Number of children: 3   Years of education: Not on file   Highest education level: 12th grade  Occupational History   Not on file  Tobacco Use   Smoking status: Some Days    Current packs/day: 1.00    Types: Cigars, Cigarettes   Smokeless tobacco: Never   Tobacco comments:    A cigarette or black n mild "once in a while"  Vaping Use   Vaping status: Never Used  Substance and Sexual Activity   Alcohol use: Not Currently    Comment: social drinking   Drug use: Not Currently    Types: Cocaine, Marijuana    Comment: no use in about 1 year (04/26/23)   Sexual activity: Not Currently    Birth control/protection: Post-menopausal  Other Topics Concern   Not on file  Social History Narrative   Not on file   Social Drivers of Health   Financial Resource Strain: High Risk (12/14/2021)   Overall Financial Resource Strain (CARDIA)    Difficulty of Paying Living Expenses: Hard  Food Insecurity: No Food Insecurity (05/09/2023)   Hunger Vital Sign    Worried About Running Out of Food in the Last Year: Never true    Ran Out of Food in the Last Year: Never true  Transportation Needs: No Transportation Needs (05/09/2023)   PRAPARE - Administrator, Civil Service (Medical): No    Lack of Transportation (Non-Medical): No  Physical Activity: Inactive (02/21/2017)   Exercise Vital Sign    Days of Exercise per Week: 0 days    Minutes of Exercise per Session: 0 min  Stress: No Stress  Concern Present (02/21/2017)   [employer] of Occupational Health - Occupational Stress Questionnaire    Feeling of Stress : Not at all  Social Connections: Unknown (05/09/2023)   Social Connection and Isolation Panel [NHANES]    Frequency of Communication with Friends and Family: Twice a week    Frequency of Social Gatherings with Friends and Family: Twice a week    Attends Religious Services: Not on Marketing executive or Organizations: Not on file    Attends Banker Meetings: Not on file    Marital Status: Not on file    No Known Allergies  Outpatient Medications Prior to Visit  Medication Sig Dispense Refill   aspirin  EC 81 MG tablet Take 1 tablet (81 mg total) by mouth daily. Swallow whole.     atorvastatin  (LIPITOR) 40 MG tablet Take 1 tablet (40 mg total) by mouth daily at 6 PM. 90 tablet 3   carvedilol  (COREG ) 12.5 MG tablet Take 1 tablet (  12.5 mg total) by mouth 2 (two) times daily with a meal. 60 tablet 1   clopidogrel  (PLAVIX ) 75 MG tablet Take 1 tablet (75 mg total) by mouth daily. 90 tablet 3   empagliflozin  (JARDIANCE ) 10 MG TABS tablet Take 1 tablet (10 mg total) by mouth daily. 30 tablet 3   Evolocumab  (REPATHA  SURECLICK) 140 MG/ML SOAJ Inject 140 mg into the skin every 14 (fourteen) days. 6 mL 3   ezetimibe  (ZETIA ) 10 MG tablet Take 1 tablet (10 mg total) by mouth daily. 90 tablet 3   spironolactone  (ALDACTONE ) 25 MG tablet Take 0.5 tablets (12.5 mg total) by mouth daily. 30 tablet 1   Oxycodone  HCl 10 MG TABS Take 1 tablet (10 mg total) by mouth every 6 (six) hours as needed for severe pain (pain score 7-10). (Patient not taking: Reported on 05/09/2023) 20 tablet 0   No facility-administered medications prior to visit.     ROS Review of Systems  Constitutional:  Negative for activity change and appetite change.  HENT:  Negative for sinus pressure and sore throat.   Respiratory:  Negative for chest tightness, shortness of breath and  wheezing.   Cardiovascular:  Positive for chest pain. Negative for palpitations.  Gastrointestinal:  Negative for abdominal distention, abdominal pain and constipation.  Genitourinary: Negative.   Musculoskeletal: Negative.   Psychiatric/Behavioral:  Positive for sleep disturbance. Negative for behavioral problems and dysphoric mood.     Objective:  BP 99/67   Pulse 77   Ht 5\' 4"  (1.626 m)   Wt 140 lb (63.5 kg)   LMP  (LMP Unknown) Comment: Pt unsure why she no longer gets her period  SpO2 100%   BMI 24.03 kg/m      05/09/2023    1:45 PM 05/08/2023   10:30 AM 05/04/2023   11:11 AM  BP/Weight  Systolic BP 99 92 80  Diastolic BP 67 60 60  Wt. (Lbs) 140 139.2 138  BMI 24.03 kg/m2 23.89 kg/m2 23.69 kg/m2      Physical Exam Constitutional:      Appearance: She is well-developed.  Cardiovascular:     Rate and Rhythm: Normal rate.     Heart sounds: Normal heart sounds. No murmur heard. Pulmonary:     Effort: Pulmonary effort is normal.     Breath sounds: Normal breath sounds. No wheezing or rales.  Chest:     Chest wall: No tenderness.  Abdominal:     General: Bowel sounds are normal. There is no distension.     Palpations: Abdomen is soft. There is no mass.     Tenderness: There is no abdominal tenderness.  Musculoskeletal:        General: Normal range of motion.     Right lower leg: No edema.     Left lower leg: No edema.     Comments: Callus on lateral aspect of left foot, lateral aspect of left fifth toe and medial aspect of left great toe  Neurological:     Mental Status: She is alert and oriented to person, place, and time.  Psychiatric:        Mood and Affect: Mood normal.        Latest Ref Rng & Units 04/30/2023    4:58 AM 04/29/2023    5:12 PM 04/29/2023    4:01 AM  CMP  Glucose 70 - 99 mg/dL 161  096  045   BUN 6 - 20 mg/dL 19  19  10    Creatinine 0.44 -  1.00 mg/dL 7.82  9.56  2.13   Sodium 135 - 145 mmol/L 133  128  129   Potassium 3.5 - 5.1 mmol/L  4.1  3.5  3.8   Chloride 98 - 111 mmol/L 100  96  97   CO2 22 - 32 mmol/L 23  22  21    Calcium  8.9 - 10.3 mg/dL 8.6  8.2  8.0     Lipid Panel     Component Value Date/Time   CHOL 136 12/07/2022 1610   CHOL 156 03/03/2021 1132   TRIG 56 12/07/2022 1610   HDL 51 12/07/2022 1610   HDL 60 03/03/2021 1132   CHOLHDL 2.7 12/07/2022 1610   VLDL 11 12/07/2022 1610   LDLCALC 74 12/07/2022 1610   LDLCALC 80 03/03/2021 1132    CBC    Component Value Date/Time   WBC 12.6 (H) 05/01/2023 0913   RBC 3.29 (L) 05/01/2023 0913   HGB 9.8 (L) 05/01/2023 0913   HCT 26.6 (L) 05/01/2023 0913   PLT 133 (L) 05/01/2023 0913   MCV 80.9 05/01/2023 0913   MCH 29.8 05/01/2023 0913   MCHC 36.8 (H) 05/01/2023 0913   RDW 12.5 05/01/2023 0913   LYMPHSABS 2.8 11/19/2021 0339   MONOABS 0.4 11/19/2021 0339   EOSABS 0.2 11/19/2021 0339   BASOSABS 0.1 11/19/2021 0339    Lab Results  Component Value Date   HGBA1C 5.5 04/26/2023       Assessment & Plan Aortic valve replacement Post-operative status with reported tightness and difficulty sleeping, likely related to recovery. Pain managed post-surgery. Entresto  discontinued. No dyspnea or edema. Appetite normal. Currently on pain medication - Follow up with cardiothoracic surgeon in May. - Follow up with cardiologist as scheduled.  Insomnia New onset insomnia likely related to post-surgical recovery and anxiety. No caffeine intake in the afternoon or daytime naps. Hydroxyzine  prescribed for sleep aid. - Prescribe hydroxyzine  once daily at bedtime. - Advise to avoid caffeine after noon. - Advise to avoid daytime naps. - Hygiene discussed  Hypertension Blood pressure low but asymptomatic. No lightheadedness. - Continue current medication  Heart failure with improved EF Euvolemic with EF of 5 to 60% -Continue SGLT2i, beta-blocker, spironolactone . -Entresto  was discontinued at discharge - Follow-up with cardiology regarding optimization of  management  Smoking cessation Quit smoking two weeks ago, coinciding with surgery. No current smoking.  Foot callus Callus present, requires podiatric evaluation. - Referral to podiatrist for evaluation and management.      Meds ordered this encounter  Medications   hydrOXYzine  (ATARAX ) 25 MG tablet    Sig: Take 1 tablet (25 mg total) by mouth at bedtime as needed.    Dispense:  30 tablet    Refill:  1    Follow-up: Return in about 6 months (around 11/08/2023) for Chronic medical conditions.       Joaquin Mulberry, MD, FAAFP. [facility] [place], [place] [phone]   05/09/2023, 5:11 PM

## 2023-05-10 ENCOUNTER — Telehealth (HOSPITAL_COMMUNITY): Payer: Self-pay

## 2023-05-10 NOTE — Telephone Encounter (Signed)
 Called and spoke with pt in regards to CR, pt stated she is not interested at this time due to weakness.   Closed referral

## 2023-05-12 LAB — ECHO INTRAOPERATIVE TEE
AR max vel: 1.96 cm2
AV Area VTI: 2.22 cm2
AV Area mean vel: 2.12 cm2
AV Mean grad: 7.3 mmHg
AV Peak grad: 15 mmHg
Ao pk vel: 1.94 m/s
Height: 64 in
MV VTI: 3.36 cm2
Weight: 2284.81 [oz_av]

## 2023-05-17 ENCOUNTER — Ambulatory Visit: Admitting: Podiatrist

## 2023-05-17 ENCOUNTER — Encounter: Payer: Self-pay | Admitting: Podiatrist

## 2023-05-17 DIAGNOSIS — D492 Neoplasm of unspecified behavior of bone, soft tissue, and skin: Secondary | ICD-10-CM

## 2023-05-17 DIAGNOSIS — D2371 Other benign neoplasm of skin of right lower limb, including hip: Secondary | ICD-10-CM

## 2023-05-17 NOTE — Progress Notes (Signed)
 Chief Complaint  Patient presents with   Callouses    Rm5/ not diabetic/ painful callous right foot lateral side/ problem started 1 year ago/ 3/10 pain very tender sore to touch and with pressure/ has not tried any treatments     HPI: Patient is 58 y.o. female who presents today for concerns as listed above. History reviewed with patient.  Leslie Walker just had heart surgery but is overall feeling well.  She is not diabetic and denies symptoms of neuropathy.    Patient Active Problem List   Diagnosis Date Noted   Severe aortic regurgitation 04/28/2023   Nonrheumatic aortic valve insufficiency 04/19/2022   Coronary artery disease 11/20/2021   Elevated troponin 11/19/2021   Heart failure (HCC) 11/19/2021   Acute on chronic systolic heart failure (HCC) 11/18/2021   Chronic systolic heart failure (HCC)    Chest pain, precordial 10/09/2020   Cocaine use 10/09/2020   NSTEMI (non-ST elevated myocardial infarction) (HCC) 10/09/2020   Polysubstance abuse (HCC) 10/09/2020   Hyperlipidemia 10/09/2020   Lung nodule 10/09/2020   Tobacco dependence 10/09/2020   Screening breast examination 09/04/2018   Heroin dependence (HCC) 06/29/2018   TIA (transient ischemic attack) 06/24/2018   Nail fungus 04/20/2015   Porokeratosis 04/20/2015   History of ulceration 04/20/2015   Foot ulcer (HCC) 02/17/2014   Cellulitis of foot, right 02/17/2014   Chronic pain syndrome 02/17/2014   Essential hypertension 02/17/2014    Current Outpatient Medications on File Prior to Visit  Medication Sig Dispense Refill   aspirin  EC 81 MG tablet Take 1 tablet (81 mg total) by mouth daily. Swallow whole.     atorvastatin  (LIPITOR) 40 MG tablet Take 1 tablet (40 mg total) by mouth daily at 6 PM. 90 tablet 3   carvedilol  (COREG ) 12.5 MG tablet Take 1 tablet (12.5 mg total) by mouth 2 (two) times daily with a meal. 60 tablet 1   clopidogrel  (PLAVIX ) 75 MG tablet Take 1 tablet (75 mg total) by mouth daily. 90 tablet 3    empagliflozin  (JARDIANCE ) 10 MG TABS tablet Take 1 tablet (10 mg total) by mouth daily. 30 tablet 3   Evolocumab  (REPATHA  SURECLICK) 140 MG/ML SOAJ Inject 140 mg into the skin every 14 (fourteen) days. 6 mL 3   ezetimibe  (ZETIA ) 10 MG tablet Take 1 tablet (10 mg total) by mouth daily. 90 tablet 3   hydrOXYzine  (ATARAX ) 25 MG tablet Take 1 tablet (25 mg total) by mouth at bedtime as needed. 30 tablet 1   Oxycodone  HCl 10 MG TABS Take 1 tablet (10 mg total) by mouth every 6 (six) hours as needed for severe pain (pain score 7-10). 20 tablet 0   spironolactone  (ALDACTONE ) 25 MG tablet Take 0.5 tablets (12.5 mg total) by mouth daily. 30 tablet 1   No current facility-administered medications on file prior to visit.    No Known Allergies  Review of Systems No fevers, chills, nausea, muscle aches, no difficulty breathing, no calf pain, no chest pain or shortness of breath.   Physical Exam  GENERAL APPEARANCE: Alert, conversant. Appropriately groomed. No acute distress.   VASCULAR: Pedal pulses palpable 2/4 DP and 1/4 PT bilateral.  Capillary refill time is immediate to all digits,  Proximal to distal cooling is warm to warm.  Digital perfusion adequate.   NEUROLOGIC: sensation is intact to 5.07 monofilament at 5/5 sites bilateral.  Light touch is intact bilateral, vibratory sensation intact bilateral  MUSCULOSKELETAL: acceptable muscle strength, tone and stability bilateral.  No gross boney  pedal deformities noted.  No pain, crepitus or limitation noted with foot and ankle range of motion bilateral.   DERMATOLOGIC: large callus present lateral right foot at the fifth metatarsal base area.  Intact integument is present post debridement.     Assessment     ICD-10-CM   1. Benign neoplasm of skin of right foot  D23.71        Plan  Pared the lesion with a 15 blade and applied salinocaine and a pad.  Gave instructions for at home care.  Discussed she would likely need routine care for this  area.  She will call when further treatment is requested.

## 2023-05-17 NOTE — Progress Notes (Signed)
 Advanced Heart Failure Clinic Note   PCP: Joaquin Mulberry, MD HF Cardiologist: Dr. Mitzie Anda  Leslie Walker is a 58 y.o. with a history of HTN, hyperlipidemia, stroke cypotogenic 2020/LINQ placement, CAD, polysubstance abuse heroin/cocaine abuse, and chronic HFrEF.    Admitted 09/2020 with NSTEMI. CT negative for PE but did show multivessel coronary calcium . Echo showed EF 45-50%, RV normal, moderate aortic valve stenosis. Had cath with overlapping DES to mid RCA, also with 70-80% left circumflex, left main patent, mid LAD 55%. Plan for DAPT with Aspirin  + ticagrelor   x 12 months.     Admitted 11/23 with CHF.  3 months PTA she had run out of all medications. Says she didn't call for refills.   Last used cocaine 7 days prior to admission and heroin about 2 weeks prior. She was diuresed with IV lasix  and GDMT titrated. She was enrolled in paramedicine program. Echo with EF 20-25%, RV okay, moderate to severe AI with possible mobile mass. TEE showed EF 20%, mildly reduced RV, bicuspid aortic valve with moderate to severe AI.  R/LHC in 11/23 showed 75% mid LCx stenosis, elevated PCWP and LVEDP but normal RV pressure, moderate pulmonary venous hypertension, CI low by thermo but preserved by Fick.  cMRI in 11/23 showed LVEF 18%, RVEF 48%, bicuspid aortic valve with severe AI, mid-wall LGE in inferolateral wall could be due to prior myocarditis but patient has CAD in LCx chronically.  Structural heart team reviewed her studies, not felt to be a candidate for TAVR.  Echo 3/24: EF 50-55%, normal RV, bicuspid aortic valve with moderate AS and probably moderate AI.  TEE (4/24) showed LVEF 55%, normal RV, bicuspid AoV with fused noncoronary and right coronary cusps, mild-moderate AS with mean gradient 19 and AVA 1.57 cm^2, moderate to severe AI.  Echo 1/25: EF 55-60%, no RWMA, RV normal, small pericardial effusion present, trivial MR, bicuspic AV, AV regurgitation mod-severe. Mild-mod AV stenosis. AV mean gradient 11  mmHg  [facility] 3/25 showed normal filling and PA pressures, preserved CI and 80% stenosis in the mid LCx. S/p AV replacement 4/25.   Today she returns for post hospital HF follow up. Overall feeling fine. She is not SOB walking outside uphill. Denies palpitations, abnormal bleeding, CP, dizziness, edema, or PND/Orthopnea. Appetite ok. No fever or chills. Weight at home 142 pounds. Taking all medications. Stopped smoking since surgery, no further drug use, no ETOH use. 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 Interested in cardiac rehab.   ECG (personally reviewed): NSR 80 bpm  Labs (1/24): K 4.2, creatinine 1.08 Labs (3/24): K 4.3, creatinine 1.03, LDL 55 Labs (7/24): K 4.3, creatinine 0.97 Labs (4/25): K 4.1, creatinine 0.95  PMH: 1. Hyperlipidemia 2. CVA: 2020, cryptogenic.  LINQ monitor placed.  3. Polysubstance abuse: Cocaine, heroin.  4. CAD: NSTEMI 9/22 with DES to mid RCA.  - LHC (11/23): 75% mLCx, 50% pLAD.  5. Bicuspid aortic valve disorder:  - CTA chest 9/22 with no thoracic aortic aneurysm.  - TEE (11/23): Moderate-severe AI - Echo (3/24): Moderate AS with mean gradient 18/AVA 1.04 cm^2; moderate AI.  - Echo 1/25: EF 55-60%, no RWMA, RV normal, small pericardial effusion present, trivial MR, bicuspic AV, AV regurgitation mod-severe. Mild-mod AV stenosis. AV mean gradient 11 mmHg - s/p AoV replacement 4/25 6. Chronic systolic CHF: Nonischemic cardiomyopathy.  - RHC (11/23): mean RA 6, PA 51/29 mean 39, mean PCWP 32, CI 2.4 Fick with PVR 1.7 WU - cMRI (11/23): Moderate LV dilation with  EF 18%, RV EF 48%, bicuspid aortic valve with severe AI, non-coronary LGE pattern with mid-wall LGE in the inferolateral wall (?prior myocarditis) though patient does have CAD in LCx.   - Echo (3/24): EF 50-55%, normal RV, bicuspid aortic valve with moderate AS and probably moderate AI.  - TEE (4/24): LVEF 55%, normal RV, no LAA thrombus, PFO or ASD, bicuspid AoV withfused noncoronary and right  coronary cusps, mild-moderate AS with mean gradient 19 and AVA 1.57 cm^2, moderate to severe AI. - Echo (1/25): EF 55-60%, no RWMA, RV normal, small pericardial effusion present, trivial MR, bicuspic AV, AV regurgitation mod-severe. Mild-mod AV stenosis. AV mean gradient 11 mmHg - R/LHC (2/25): 80% stenosis in mid Cx; RA 5, PA 30/9 (18), PCWP 12, CO/CI (Fick) 4.98/2.99  ROS: All systems negative except as listed in HPI, PMH and Problem List.  Social History   Socioeconomic History   Marital status: Single    Spouse name: Not on file   Number of children: 3   Years of education: Not on file   Highest education level: 12th grade  Occupational History   Not on file  Tobacco Use   Smoking status: Some Days    Current packs/day: 1.00    Types: Cigars, Cigarettes   Smokeless tobacco: Never   Tobacco comments:    A cigarette or black n mild "once in a while"  Vaping Use   Vaping status: Never Used  Substance and Sexual Activity   Alcohol use: Not Currently    Comment: social drinking   Drug use: Not Currently    Types: Cocaine, Marijuana    Comment: no use in about 1 year (04/26/23)   Sexual activity: Not Currently    Birth control/protection: Post-menopausal  Other Topics Concern   Not on file  Social History Narrative   Not on file   Social Drivers of Health   Financial Resource Strain: High Risk (12/14/2021)   Overall Financial Resource Strain (CARDIA)    Difficulty of Paying Living Expenses: Hard  Food Insecurity: No Food Insecurity (05/09/2023)   Hunger Vital Sign    Worried About Running Out of Food in the Last Year: Never true    Ran Out of Food in the Last Year: Never true  Transportation Needs: No Transportation Needs (05/09/2023)   PRAPARE - Administrator, Civil Service (Medical): No    Lack of Transportation (Non-Medical): No  Physical Activity: Inactive (02/21/2017)   Exercise Vital Sign    Days of Exercise per Week: 0 days    Minutes of Exercise per  Session: 0 min  Stress: No Stress Concern Present (02/21/2017)   [employer] of Occupational Health - Occupational Stress Questionnaire    Feeling of Stress : Not at all  Social Connections: Unknown (05/09/2023)   Social Connection and Isolation Panel [NHANES]    Frequency of Communication with Friends and Family: Twice a week    Frequency of Social Gatherings with Friends and Family: Twice a week    Attends Religious Services: Not on Marketing executive or Organizations: Not on file    Attends Banker Meetings: Not on file    Marital Status: Not on file  Intimate Partner Violence: Not At Risk (05/09/2023)   Humiliation, Afraid, Rape, and Kick questionnaire    Fear of Current or Ex-Partner: No    Emotionally Abused: No    Physically Abused: No    Sexually Abused: No   FH:  Family History  Problem Relation Age of Onset   Hypertension Mother    Hypertension Father    Hypertension Brother    Stroke Sister    Past Medical History:  Diagnosis Date   Chronic systolic heart failure (HCC)    Cocaine abuse (HCC)    last use about 1 year ago (04/26/23)   Coronary artery disease    Dyslipidemia    Hypertension    Noncompliance w/medication treatment due to intermit use of medication    Stroke (HCC) 2021   no deficits   Current Outpatient Medications  Medication Sig Dispense Refill   aspirin  EC 81 MG tablet Take 1 tablet (81 mg total) by mouth daily. Swallow whole.     atorvastatin  (LIPITOR) 40 MG tablet Take 1 tablet (40 mg total) by mouth daily at 6 PM. 90 tablet 3   carvedilol  (COREG ) 12.5 MG tablet Take 1 tablet (12.5 mg total) by mouth 2 (two) times daily with a meal. 60 tablet 1   clopidogrel  (PLAVIX ) 75 MG tablet Take 1 tablet (75 mg total) by mouth daily. 90 tablet 3   empagliflozin  (JARDIANCE ) 10 MG TABS tablet Take 1 tablet (10 mg total) by mouth daily. 30 tablet 3   Evolocumab  (REPATHA  SURECLICK) 140 MG/ML SOAJ Inject 140 mg into the skin every 14  (fourteen) days. 6 mL 3   ezetimibe  (ZETIA ) 10 MG tablet Take 1 tablet (10 mg total) by mouth daily. 90 tablet 3   hydrOXYzine  (ATARAX ) 25 MG tablet Take 1 tablet (25 mg total) by mouth at bedtime as needed. 30 tablet 1   Oxycodone  HCl 10 MG TABS Take 1 tablet (10 mg total) by mouth every 6 (six) hours as needed for severe pain (pain score 7-10). 20 tablet 0   spironolactone  (ALDACTONE ) 25 MG tablet Take 0.5 tablets (12.5 mg total) by mouth daily. 30 tablet 1   No current facility-administered medications for this encounter.   BP 108/66 (BP Location: Left Arm, Patient Position: Sitting, Cuff Size: Normal)   Pulse 81   Ht 5\' 4"  (1.626 m)   Wt 65.2 kg (143 lb 12.8 oz)   LMP  (LMP Unknown) Comment: Pt unsure why she no longer gets her period  SpO2 100%   BMI 24.68 kg/m   Wt Readings from Last 3 Encounters:  05/19/23 65.2 kg (143 lb 12.8 oz)  05/09/23 63.5 kg (140 lb)  05/08/23 63.1 kg (139 lb 3.2 oz)   PHYSICAL EXAM: General:  NAD. No resp difficulty, walked into clinic HEENT: Normal Neck: Supple. No JVD. Cor: Regular rate & rhythm. No rubs, gallops or murmurs. Lungs: Clear Abdomen: Soft, nontender, nondistended.  Extremities: No cyanosis, clubbing, rash, edema Neuro: Alert & oriented x 3, moves all 4 extremities w/o difficulty. Affect pleasant.  ASSESSMENT & PLAN:  1. Chronic systolic CHF: Nonischemic CMP.  Echo 11/23 with EF 20-25%, global hypokinesis, moderate LV dilation, normal RV, mod-severe AI.  RHC showed significantly elevated PCWP and LVEDP but normal RA pressure; CI 2.4 Fick/2.0 thermo.  Cause of cardiomyopathy uncertain, CAD does not appear to have progressed (75% mLCx stenosis on 11/23 cath).  Possibly some component of substance abuse (cocaine) though by cardiac MRI in 11/23, prior myocarditis is also a concern (non-coronary LGE noted).  LV EF 18% on cMRI with relatively preserved RV function.  Aortic insufficiency also likely plays a role (moderate to severe on TEE in  11/23).  Repeat echo 3/24 showed improvement with EF 50-55%, normal RV, bicuspid aortic valve with  moderate AS and moderate AI. Echo 1/25 EF 55-60%, RV normal, moderate/severe AI. Improvement in EF suggests a role for cocaine in her cardiomyopathy (she had quit using drugs).  Now s/p AVR 4/25. She is not volume overloaded on exam, NYHA class I-II.  - Off Entresto , no BP room to add back today. - Continue Coreg  12.5 mg bid.    - Continue Jardiance  10 mg daily. Denies GU symptoms.  - Continue spironolactone  12.5 mg daily. BMET/BNP today. 2. CAD: DES to RCA in 9/22.  Cath in 11/23 with patent RCA stent, 50% proximal LAD, 75% mid LCx.  The LCx stenosis appeared similar to the prior cath, no intervention on LCx . No chest pain. Lp(a) markedly elevated with age-advanced CAD.  - Continue Plavix  75 daily.  - Continue atorvastatin . - With elevated Lp(a), will need to be aggressive with lipid management.  LDL 74 11/24, she is on Repatha  + Zetia . 3. Aortic valve disorder: Moderate-severe AI on 11/23 echo and TEE. TEE showed bicuspid aortic valve with fusion of right and noncoronary cusps.  Moderate-severe AI by TEE.  By cardiac MRI, aortic valve regurgitant fraction was 38% which suggests severe AI.  She was deemed not to be a TAVR candidate, but with improvement in LV function, she should be SAVR candidate. TEE in 4/24 showed mild-moderate AS with mean gradient 19 and AVA 1.57 cm^2, moderate to severe AI. Echo 1/25: bicuspic AV, AV regurgitation mod-severe. Mild-mod AV stenosis. AV mean gradient 11 mmHg  - s/p AV replacement 04/2023. - Recommended screening in 1st degree relatives for bicuspid aortic valve - Refer to Cardiac Rehab. 4. Polysubstance abuse: Cocaine/heroin.  She reports abstinence and EF has improved.  - Congratulated 5. H/o CVA: She is on Plavix . 6. Smoking: Quit since hospitalization - Congratulated.  7. SDOH: Continue paramedicine, appreciate their assistance.  Follow up in 3-4 months with  Dr. America Bake [facility] FNP-BC 05/19/2023

## 2023-05-17 NOTE — Patient Instructions (Signed)
 Foot Exfoliation Instructions:  1)  Get a tub of warm water (about 10 cups of water or enough water to cover the bottom of your foot)  and place 1/2 cup Epsom salts in the water ( water will be a little cloudy) Soak for 15-20  minutes.  2)  remove your foot from the soak and scrub the feet with Dr. Teals epsom salt exfoliant scrub ([employer])   3)  Next use a pumice stone or a pedicure file to scrub down the rough areas of skin on your feet-  do this while your feet are still wet.  You may also prefer to go into the shower and do this while showering.  (Every other day at first , then once weekly for maintenance)

## 2023-05-19 ENCOUNTER — Other Ambulatory Visit (HOSPITAL_COMMUNITY): Payer: Self-pay | Admitting: Emergency Medicine

## 2023-05-19 ENCOUNTER — Encounter (HOSPITAL_COMMUNITY): Payer: Self-pay

## 2023-05-19 ENCOUNTER — Ambulatory Visit (HOSPITAL_COMMUNITY)
Admission: RE | Admit: 2023-05-19 | Discharge: 2023-05-19 | Disposition: A | Discharge status: Home / Self Care | Source: Ambulatory Visit | Attending: Family Medicine | Admitting: Family Medicine

## 2023-05-19 VITALS — BP 108/66 | HR 81 | Ht 64.0 in | Wt 143.8 lb

## 2023-05-19 DIAGNOSIS — Z8249 Family history of ischemic heart disease and other diseases of the circulatory system: Secondary | ICD-10-CM | POA: Diagnosis not present

## 2023-05-19 DIAGNOSIS — Z823 Family history of stroke: Secondary | ICD-10-CM | POA: Diagnosis not present

## 2023-05-19 DIAGNOSIS — I2510 Atherosclerotic heart disease of native coronary artery without angina pectoris: Secondary | ICD-10-CM | POA: Diagnosis not present

## 2023-05-19 DIAGNOSIS — Z7902 Long term (current) use of antithrombotics/antiplatelets: Secondary | ICD-10-CM | POA: Diagnosis not present

## 2023-05-19 DIAGNOSIS — F1411 Cocaine abuse, in remission: Secondary | ICD-10-CM | POA: Diagnosis not present

## 2023-05-19 DIAGNOSIS — I351 Nonrheumatic aortic (valve) insufficiency: Secondary | ICD-10-CM | POA: Insufficient documentation

## 2023-05-19 DIAGNOSIS — I110 Hypertensive heart disease with heart failure: Secondary | ICD-10-CM | POA: Insufficient documentation

## 2023-05-19 DIAGNOSIS — Z955 Presence of coronary angioplasty implant and graft: Secondary | ICD-10-CM | POA: Diagnosis not present

## 2023-05-19 DIAGNOSIS — Z139 Encounter for screening, unspecified: Secondary | ICD-10-CM

## 2023-05-19 DIAGNOSIS — Z8673 Personal history of transient ischemic attack (TIA), and cerebral infarction without residual deficits: Secondary | ICD-10-CM | POA: Diagnosis not present

## 2023-05-19 DIAGNOSIS — R9431 Abnormal electrocardiogram [ECG] [EKG]: Secondary | ICD-10-CM | POA: Diagnosis not present

## 2023-05-19 DIAGNOSIS — E785 Hyperlipidemia, unspecified: Secondary | ICD-10-CM | POA: Diagnosis not present

## 2023-05-19 DIAGNOSIS — I5022 Chronic systolic (congestive) heart failure: Secondary | ICD-10-CM | POA: Insufficient documentation

## 2023-05-19 DIAGNOSIS — Z5986 Financial insecurity: Secondary | ICD-10-CM | POA: Diagnosis not present

## 2023-05-19 DIAGNOSIS — F1910 Other psychoactive substance abuse, uncomplicated: Secondary | ICD-10-CM

## 2023-05-19 DIAGNOSIS — Z79899 Other long term (current) drug therapy: Secondary | ICD-10-CM | POA: Diagnosis not present

## 2023-05-19 DIAGNOSIS — Z87891 Personal history of nicotine dependence: Secondary | ICD-10-CM | POA: Insufficient documentation

## 2023-05-19 LAB — BRAIN NATRIURETIC PEPTIDE: B Natriuretic Peptide: 285.3 pg/mL — ABNORMAL HIGH (ref 0.0–100.0)

## 2023-05-19 LAB — BASIC METABOLIC PANEL WITH GFR
Anion gap: 9 (ref 5–15)
BUN: 10 mg/dL (ref 6–20)
CO2: 24 mmol/L (ref 22–32)
Calcium: 8.6 mg/dL — ABNORMAL LOW (ref 8.9–10.3)
Chloride: 103 mmol/L (ref 98–111)
Creatinine, Ser: 1 mg/dL (ref 0.44–1.00)
GFR, Estimated: >60 mL/min (ref >60)
Glucose, Bld: 121 mg/dL — ABNORMAL HIGH (ref 70–99)
Potassium: 3.9 mmol/L (ref 3.5–5.1)
Sodium: 136 mmol/L (ref 135–145)

## 2023-05-19 LAB — CBC
HCT: 27.8 % — ABNORMAL LOW (ref 36.0–46.0)
Hemoglobin: 10 g/dL — ABNORMAL LOW (ref 12.0–15.0)
MCH: 30.2 pg (ref 26.0–34.0)
MCHC: 36 g/dL (ref 30.0–36.0)
MCV: 84 fL (ref 80.0–100.0)
Platelets: 358 10*3/uL (ref 150–400)
RBC: 3.31 MIL/uL — ABNORMAL LOW (ref 3.87–5.11)
RDW: 14.3 % (ref 11.5–15.5)
WBC: 5.3 10*3/uL (ref 4.0–10.5)
nRBC: 0 % (ref 0.0–0.2)

## 2023-05-19 NOTE — Patient Instructions (Addendum)
 EKG done today.   Labs done today. We will contact you only if your labs are abnormal.  No medication changes were made. Please continue all current medications as prescribed.  You have been referred to Cardiac Rehab. They will contact you to schedule an appointment.   Your physician recommends that you schedule a follow-up appointment in: 4 months with Dr. Mitzie Anda Please contact our office in July to schedule a August 2025 appointment.   If you have any questions or concerns before your next appointment please send us  a message through [place] or call our office at [phone].    TO LEAVE A MESSAGE FOR THE NURSE SELECT OPTION 2, PLEASE LEAVE A MESSAGE INCLUDING: YOUR NAME DATE OF BIRTH CALL BACK NUMBER REASON FOR CALL**this is important as we prioritize the call backs  YOU WILL RECEIVE A CALL BACK THE SAME DAY AS LONG AS YOU CALL BEFORE 4:00 PM   Do the following things EVERYDAY: Weigh yourself in the morning before breakfast. Write it down and keep it in a log. Take your medicines as prescribed Eat low salt foods--Limit salt (sodium) to 2000 mg per day.  Stay as active as you can everyday Limit all fluids for the day to less than 2 liters   At the [facility], you and your health needs are our priority. As part of our continuing mission to provide you with exceptional heart care, we have created designated Provider Care Teams. These Care Teams include your primary Cardiologist (physician) and Advanced Practice Providers (APPs- Physician Assistants and Nurse Practitioners) who all work together to provide you with the care you need, when you need it.   You may see any of the following providers on your designated Care Team at your next follow up: Dr Jules Oar Dr Peder Bourdon Dr. Mimi Alt, NP Ruddy Corral, [place] [facility] [place], [place] Dennise Fitz, NP Luster Salters, PharmD   Please be sure to bring in all your  medications bottles to every appointment.    Thank you for choosing [facility]

## 2023-05-19 NOTE — Progress Notes (Unsigned)
 Med rec only:  Med changes from today's clinic visit 40mg . Lasix  on Monday's and Friday's w/ 20mEq Potassium on these days. Waiting on these new changes to be called into pharmacy. Med box reconciled x 2 weeks.  Will add Lasix  and Potassium next week.    Ermalinda Hays, EMT-Paramedic [phone] 05/21/2023

## 2023-05-19 NOTE — Progress Notes (Signed)
 [address]       Leslie Walker [address]             [phone]       HPI: Leslie Walker is a 58 year old woman with a past medical history of HFrEF, CAD, hypertension, hyperlipidemia, stroke, and nicotine  dependence. The patient returns for routine postoperative follow-up having undergone Inspiris Resilia Aortic valve replacement size 21 mm by Dr. Honey Lusty on 04/28/23. The patient's early postoperative recovery while in the hospital was notable for postoperative hypertension requiring a Cleviprex  drip and mild thrombocytopenia that resolved after hospital discharge. She saw cardiology on 05/02 and was doing well, no medication changes were made.  Since hospital discharge the patient reports she has some good and bad days. She admits to some chest soreness at night but does no longer takes pain medication. She denies shortness of breath, dizziness, LOC, and leg swelling. She admits to having a great appetite that has improved from her appetite prior to surgery. She states she is no longer smoking, drinking or doing drugs.    Current Outpatient Medications  Medication Sig Dispense Refill   aspirin  EC 81 MG tablet Take 1 tablet (81 mg total) by mouth daily. Swallow whole.     atorvastatin  (LIPITOR) 40 MG tablet Take 1 tablet (40 mg total) by mouth daily at 6 PM. 90 tablet 3   carvedilol  (COREG ) 12.5 MG tablet Take 1 tablet (12.5 mg total) by mouth 2 (two) times daily with a meal. 60 tablet 1   clopidogrel  (PLAVIX ) 75 MG tablet Take 1 tablet (75 mg total) by mouth daily. 90 tablet 3   empagliflozin  (JARDIANCE ) 10 MG TABS tablet Take 1 tablet (10 mg total) by mouth daily. 30 tablet 3   Evolocumab  (REPATHA  SURECLICK) 140 MG/ML SOAJ Inject 140 mg into the skin every 14 (fourteen) days. 6 mL 3   ezetimibe  (ZETIA ) 10 MG tablet Take 1 tablet (10 mg total) by mouth daily. 90 tablet 3   hydrOXYzine  (ATARAX ) 25 MG tablet Take 1 tablet (25 mg total) by mouth at bedtime as needed. 30  tablet 1   Oxycodone  HCl 10 MG TABS Take 1 tablet (10 mg total) by mouth every 6 (six) hours as needed for severe pain (pain score 7-10). 20 tablet 0   spironolactone  (ALDACTONE ) 25 MG tablet Take 0.5 tablets (12.5 mg total) by mouth daily. 30 tablet 1   No current facility-administered medications for this visit.   Vitals: Today's Vitals   05/30/23 1343  BP: 124/80  Pulse: 85  Resp: 18  SpO2: 97%  Weight: 148 lb (67.1 kg)  Height: 5\' 4"  (1.626 m)   Body mass index is 25.4 kg/m.   Physical Exam: General: Alert and oriented, no acute distress Neuro: grossly intact CV: regular rate and rhythm, no murmur Pulm: Clear to auscultation bilaterally GI: +BS, nontender Extremities: No edema Wound: Healing well without sign of infection  Diagnostic Tests: CLINICAL DATA:  Status post aortic valve repair.   EXAM: CHEST - 2 VIEW   COMPARISON:  May 02, 2023.   FINDINGS: Stable cardiomediastinal silhouette. Sternotomy wires are noted. Lungs are clear. Bony thorax is unremarkable.   IMPRESSION: No active cardiopulmonary disease.     Electronically Signed   By: Rosalene Colon M.D.   On: 05/30/2023 14:30  Impression/Plan: S/P AVR: The patient is progressing well from surgery. She no longer requires narcotic pain medication but does complain of chest soreness especially at night.  I told her she could take tylenol , use heat or lidocaine  patches around the sternal incision as needed for pain. She denies shortness of breath and admits to ambulating well throughout the day. CXR is without pleural effusions, consolidation or pneumothorax. She denies dizziness and LOC. BP remains too soft to titrate GDMT. Plan to continue current medications, she will continue to follow up with the AHF team. Her appetite has improved and she is having regular bowel movements. Her incisions are healing well without sign of infection. I cleared her to drive and for cardiac rehab which she is eager to  participate in. We reviewed endocarditis and continued sternal precautions. Plan to have the patient return to the clinic as needed.   Randa Burton, PA-C [facility] [phone]

## 2023-05-25 ENCOUNTER — Other Ambulatory Visit: Payer: Self-pay

## 2023-05-26 ENCOUNTER — Other Ambulatory Visit: Payer: Self-pay

## 2023-05-26 ENCOUNTER — Other Ambulatory Visit (HOSPITAL_COMMUNITY): Payer: Self-pay | Admitting: Emergency Medicine

## 2023-05-26 ENCOUNTER — Telehealth (HOSPITAL_COMMUNITY): Payer: Self-pay

## 2023-05-26 ENCOUNTER — Encounter (HOSPITAL_COMMUNITY): Payer: Self-pay

## 2023-05-26 NOTE — Progress Notes (Signed)
 Med rec.   Had to send message to triage.  Her Furosemide  and Potassium was never called in for her 5/6 visit w/ Camilo Cella. Med rec x 2 weeks.  Will p/u Furosemide  and Potassium later today. She is to take on Mon and Friday    Leslie Walker, EMT-Paramedic [phone] 05/26/2023

## 2023-05-26 NOTE — Telephone Encounter (Signed)
Attempted to call patient in regards to Cardiac Rehab - LM on VM   Sent letter 

## 2023-05-29 ENCOUNTER — Other Ambulatory Visit: Payer: Self-pay | Admitting: Thoracic Surgery (Cardiothoracic Vascular Surgery)

## 2023-05-29 DIAGNOSIS — I351 Nonrheumatic aortic (valve) insufficiency: Secondary | ICD-10-CM

## 2023-05-30 ENCOUNTER — Ambulatory Visit (HOSPITAL_COMMUNITY)
Admission: RE | Admit: 2023-05-30 | Discharge: 2023-05-30 | Disposition: A | Discharge status: Home / Self Care | Source: Ambulatory Visit | Attending: Cardiovascular Disease | Admitting: Cardiovascular Disease

## 2023-05-30 ENCOUNTER — Other Ambulatory Visit: Payer: Self-pay

## 2023-05-30 ENCOUNTER — Other Ambulatory Visit (HOSPITAL_COMMUNITY): Payer: Self-pay

## 2023-05-30 ENCOUNTER — Encounter: Payer: Self-pay | Admitting: Physician Assistant

## 2023-05-30 ENCOUNTER — Telehealth (HOSPITAL_COMMUNITY): Payer: Self-pay | Admitting: Cardiology

## 2023-05-30 ENCOUNTER — Ambulatory Visit: Payer: Self-pay | Attending: Thoracic Surgery (Cardiothoracic Vascular Surgery) | Admitting: Physician Assistant

## 2023-05-30 VITALS — BP 124/80 | HR 85 | Resp 18 | Ht 64.0 in | Wt 148.0 lb

## 2023-05-30 DIAGNOSIS — Z952 Presence of prosthetic heart valve: Secondary | ICD-10-CM

## 2023-05-30 DIAGNOSIS — I351 Nonrheumatic aortic (valve) insufficiency: Secondary | ICD-10-CM | POA: Insufficient documentation

## 2023-05-30 DIAGNOSIS — I5022 Chronic systolic (congestive) heart failure: Secondary | ICD-10-CM

## 2023-05-30 MED ORDER — FUROSEMIDE 20 MG PO TABS
20.0000 mg | ORAL_TABLET | ORAL | 11 refills | Status: DC
  Filled 2023-05-30: qty 8, 28d supply, fill #0

## 2023-05-30 MED ORDER — POTASSIUM CHLORIDE CRYS ER 20 MEQ PO TBCR
20.0000 meq | EXTENDED_RELEASE_TABLET | ORAL | 11 refills | Status: DC
Start: 2023-06-01 — End: 2023-07-20
  Filled 2023-05-30: qty 8, 28d supply, fill #0

## 2023-05-30 NOTE — Telephone Encounter (Signed)
 Pr aware of 5/2 results via para medicine  Meds sent to pharm

## 2023-05-30 NOTE — Patient Instructions (Signed)
 You may return to driving an automobile as long as you are no longer requiring oral narcotic pain relievers during the daytime.  It would be wise to start driving only short distances during the daylight and gradually increase from there as you feel comfortable.  Continue to avoid any heavy lifting or strenuous use of your arms or shoulders for at least a total of three months from the time of surgery.  After three months you may gradually increase how much you lift or otherwise use your arms or chest as tolerated, with limits based upon whether or not activities lead to the return of significant discomfort.  Endocarditis is a potentially serious infection of heart valves or inside lining of the heart.  It occurs more commonly in patients with diseased heart valves (such as patient's with aortic or mitral valve disease) and in patients who have undergone heart valve repair or replacement.  Certain surgical and dental procedures may put you at risk, such as dental cleaning, other dental procedures, or any surgery involving the respiratory, urinary, gastrointestinal tract, gallbladder or prostate gland.   To minimize your chances for develooping endocarditis, maintain good oral health and seek prompt medical attention for any infections involving the mouth, teeth, gums, skin or urinary tract.    Always notify your doctor or dentist about your underlying heart valve condition before having any invasive procedures. You will need to take antibiotics before certain procedures, including all routine dental cleanings or other dental procedures.  Your cardiologist or dentist should prescribe these antibiotics for you to be taken ahead of time.  You are encouraged to enroll and participate in the outpatient cardiac rehab program beginning as soon as practical.

## 2023-05-31 ENCOUNTER — Ambulatory Visit (HOSPITAL_COMMUNITY): Payer: Self-pay

## 2023-06-01 ENCOUNTER — Telehealth (HOSPITAL_COMMUNITY): Payer: Self-pay | Admitting: Emergency Medicine

## 2023-06-01 ENCOUNTER — Other Ambulatory Visit: Payer: Self-pay

## 2023-06-01 NOTE — Telephone Encounter (Signed)
 Delivered.

## 2023-06-01 NOTE — Telephone Encounter (Signed)
 Delivered pt's Furosemide  and Potassium prescriptions.   Pt was not home but was able to leave meds with her daughter.  Advised her to have Ms. Ceballos to give me a call to review how they are to be taken.  Also sent text message to Ms. Nero to call me to discuss as her voicemail was full and could not leave a message.    Leslie Walker, EMT-Paramedic [phone] 06/01/2023

## 2023-06-06 ENCOUNTER — Encounter: Payer: Self-pay | Admitting: Pharmacist Clinician (PhC)/ Clinical Pharmacy Specialist

## 2023-06-06 ENCOUNTER — Telehealth (HOSPITAL_COMMUNITY): Payer: Self-pay | Admitting: Emergency Medicine

## 2023-06-06 DIAGNOSIS — E785 Hyperlipidemia, unspecified: Secondary | ICD-10-CM

## 2023-06-06 NOTE — Telephone Encounter (Signed)
 Called w/ no answer.  LVM - "calling to follow up on recent med change.  Please call me in the morning.      Ermalinda Hays, EMT-Paramedic [phone] 06/06/2023

## 2023-06-07 ENCOUNTER — Other Ambulatory Visit (HOSPITAL_COMMUNITY): Payer: Self-pay | Admitting: Emergency Medicine

## 2023-06-07 ENCOUNTER — Other Ambulatory Visit: Payer: Self-pay

## 2023-06-07 NOTE — Progress Notes (Signed)
 Paramedicine Encounter    Patient ID: Leslie Walker, female    DOB: 04-08-1965, 58 y.o.   MRN: [id number]   Complaints NONE  Assessment A&O x 4, skin W&D w/ good color.  Denies chest pain or SOB.  Lung sounds clear throughout.  No peripheral edema noted.  Started Lasix  and Potassium 5/23 & 5/26.    Compliance with meds No- got off track w/ evening doses.    Pill box filled x 1 week  Refills needed Ezetimbe, Jardiance - called in  Meds changes since last visit Lasix  & Pot Mon/Fri    Social changes  NONE   BP 120/70 (BP Location: Right Arm, Patient Position: Sitting, Cuff Size: Normal)   Pulse 80   Wt 144 lb 3.2 oz (65.4 kg)   LMP  (LMP Unknown) Comment: Pt unsure why she no longer gets her period  BMI 24.75 kg/m  Weight yesterday-Not taken Last visit weight-143lb   This is the first time I've been able to catch Leslie Walker at home for home visit since 05/19/23.  She had med changes from [facility] on 5/2.  Med's were finally called in on 5/15 and I picked them up and delivered them to her home that day but she was not there.  Spoke with her daughter and advised her to call me regarding med changes but did not hear from her.  Today advised pt that she will be taking 20mg . Lasix  and 20mEq Potassium on Monday and Fridays. Pt has somehow been missing numerous evening dosing so she's been missing her Atorvastatin  and Carvedilol .  She had no reasoning for not taking her meds other than she's "been busy".  Discussed again the importance of med compliance and she states she will try and do better. Med box reconciled for 1 week.  F/U lab visit needs to be scheduled for this med change.    ACTION: Home visit completed  Carlton Chick [phone] 06/07/23  Patient Care Team: Joaquin Mulberry, MD as PCP - General (Family Medicine) Hazle Lites, MD as PCP - Cardiology (Cardiology)  Patient Active Problem List   Diagnosis Date Noted   Severe aortic regurgitation 04/28/2023    Nonrheumatic aortic valve insufficiency 04/19/2022   Coronary artery disease 11/20/2021   Elevated troponin 11/19/2021   Heart failure (HCC) 11/19/2021   Acute on chronic systolic heart failure (HCC) 11/18/2021   Chronic systolic heart failure (HCC)    Chest pain, precordial 10/09/2020   Cocaine use 10/09/2020   NSTEMI (non-ST elevated myocardial infarction) (HCC) 10/09/2020   Polysubstance abuse (HCC) 10/09/2020   Hyperlipidemia 10/09/2020   Lung nodule 10/09/2020   Tobacco dependence 10/09/2020   Screening breast examination 09/04/2018   Heroin dependence (HCC) 06/29/2018   TIA (transient ischemic attack) 06/24/2018   Nail fungus 04/20/2015   Porokeratosis 04/20/2015   History of ulceration 04/20/2015   Foot ulcer (HCC) 02/17/2014   Cellulitis of foot, right 02/17/2014   Chronic pain syndrome 02/17/2014   Essential hypertension 02/17/2014    Current Outpatient Medications:    aspirin  EC 81 MG tablet, Take 1 tablet (81 mg total) by mouth daily. Swallow whole., Disp: , Rfl:    atorvastatin  (LIPITOR) 40 MG tablet, Take 1 tablet (40 mg total) by mouth daily at 6 PM., Disp: 90 tablet, Rfl: 3   carvedilol  (COREG ) 12.5 MG tablet, Take 1 tablet (12.5 mg total) by mouth 2 (two) times daily with a meal., Disp: 60 tablet, Rfl: 1   clopidogrel  (PLAVIX ) 75 MG tablet, Take  1 tablet (75 mg total) by mouth daily., Disp: 90 tablet, Rfl: 3   empagliflozin  (JARDIANCE ) 10 MG TABS tablet, Take 1 tablet (10 mg total) by mouth daily., Disp: 30 tablet, Rfl: 3   ezetimibe  (ZETIA ) 10 MG tablet, Take 1 tablet (10 mg total) by mouth daily., Disp: 90 tablet, Rfl: 3   furosemide  (LASIX ) 20 MG tablet, Take 1 tablet (20 mg total) by mouth 2 (two) times a week., Disp: 8 tablet, Rfl: 11   hydrOXYzine  (ATARAX ) 25 MG tablet, Take 1 tablet (25 mg total) by mouth at bedtime as needed., Disp: 30 tablet, Rfl: 1   potassium chloride  SA (KLOR-CON  M) 20 MEQ tablet, Take 1 tablet (20 mEq total) by mouth 2 (two) times a  week., Disp: 8 tablet, Rfl: 11   spironolactone  (ALDACTONE ) 25 MG tablet, Take 0.5 tablets (12.5 mg total) by mouth daily., Disp: 30 tablet, Rfl: 1   Evolocumab  (REPATHA  SURECLICK) 140 MG/ML SOAJ, Inject 140 mg into the skin every 14 (fourteen) days., Disp: 6 mL, Rfl: 3   Oxycodone  HCl 10 MG TABS, Take 1 tablet (10 mg total) by mouth every 6 (six) hours as needed for severe pain (pain score 7-10). (Patient not taking: Reported on 06/07/2023), Disp: 20 tablet, Rfl: 0 No Known Allergies   Social History   Socioeconomic History   Marital status: Single    Spouse name: Not on file   Number of children: 3   Years of education: Not on file   Highest education level: 12th grade  Occupational History   Not on file  Tobacco Use   Smoking status: Some Days    Current packs/day: 1.00    Types: Cigars, Cigarettes   Smokeless tobacco: Never   Tobacco comments:    A cigarette or black n mild "once in a while"  Vaping Use   Vaping status: Never Used  Substance and Sexual Activity   Alcohol use: Not Currently    Comment: social drinking   Drug use: Not Currently    Types: Cocaine, Marijuana    Comment: no use in about 1 year (04/26/23)   Sexual activity: Not Currently    Birth control/protection: Post-menopausal  Other Topics Concern   Not on file  Social History Narrative   Not on file   Social Drivers of Health   Financial Resource Strain: High Risk (12/14/2021)   Overall Financial Resource Strain (CARDIA)    Difficulty of Paying Living Expenses: Hard  Food Insecurity: No Food Insecurity (05/09/2023)   Hunger Vital Sign    Worried About Running Out of Food in the Last Year: Never true    Ran Out of Food in the Last Year: Never true  Transportation Needs: No Transportation Needs (05/09/2023)   PRAPARE - Administrator, Civil Service (Medical): No    Lack of Transportation (Non-Medical): No  Physical Activity: Inactive (02/21/2017)   Exercise Vital Sign    Days of Exercise  per Week: 0 days    Minutes of Exercise per Session: 0 min  Stress: No Stress Concern Present (02/21/2017)   [employer] of Occupational Health - Occupational Stress Questionnaire    Feeling of Stress : Not at all  Social Connections: Unknown (05/09/2023)   Social Connection and Isolation Panel [NHANES]    Frequency of Communication with Friends and Family: Twice a week    Frequency of Social Gatherings with Friends and Family: Twice a week    Attends Religious Services: Not on file  Active Member of Clubs or Organizations: Not on file    Attends Club or Organization Meetings: Not on file    Marital Status: Not on file  Intimate Partner Violence: Not At Risk (05/09/2023)   Humiliation, Afraid, Rape, and Kick questionnaire    Fear of Current or Ex-Partner: No    Emotionally Abused: No    Physically Abused: No    Sexually Abused: No    Physical Exam      Future Appointments  Date Time Provider Department Center  06/14/2023 11:00 AM [facility] OP 1 [facility]  06/14/2023 12:00 PM [facility] LAB [facility] None

## 2023-06-09 ENCOUNTER — Telehealth (HOSPITAL_COMMUNITY): Payer: Self-pay

## 2023-06-09 NOTE — Telephone Encounter (Signed)
 Called patient in regards to cardiac rehab referral. Patient states they are interested in CR program. Informed patient we will pass her referral to our nurse navigator for review and will check her insurance, and we will call when we are ready to schedule.

## 2023-06-14 ENCOUNTER — Ambulatory Visit (HOSPITAL_COMMUNITY): Payer: Self-pay | Admitting: Family Medicine

## 2023-06-14 ENCOUNTER — Ambulatory Visit (HOSPITAL_COMMUNITY): Payer: Self-pay | Admitting: Internal Medicine

## 2023-06-14 ENCOUNTER — Ambulatory Visit (HOSPITAL_COMMUNITY)
Admit: 2023-06-14 | Discharge: 2023-06-14 | Disposition: A | Discharge status: Home / Self Care | Attending: Family Medicine | Admitting: Family Medicine

## 2023-06-14 ENCOUNTER — Ambulatory Visit (HOSPITAL_COMMUNITY)
Admission: RE | Admit: 2023-06-14 | Discharge: 2023-06-14 | Disposition: A | Discharge status: Home / Self Care | Source: Ambulatory Visit | Attending: Family Medicine

## 2023-06-14 ENCOUNTER — Other Ambulatory Visit (HOSPITAL_COMMUNITY): Payer: Self-pay | Admitting: Emergency Medicine

## 2023-06-14 ENCOUNTER — Other Ambulatory Visit: Payer: Self-pay

## 2023-06-14 DIAGNOSIS — I340 Nonrheumatic mitral (valve) insufficiency: Secondary | ICD-10-CM | POA: Diagnosis not present

## 2023-06-14 DIAGNOSIS — I5022 Chronic systolic (congestive) heart failure: Secondary | ICD-10-CM | POA: Diagnosis present

## 2023-06-14 DIAGNOSIS — I272 Pulmonary hypertension, unspecified: Secondary | ICD-10-CM | POA: Insufficient documentation

## 2023-06-14 DIAGNOSIS — I3139 Other pericardial effusion (noninflammatory): Secondary | ICD-10-CM | POA: Diagnosis not present

## 2023-06-14 LAB — BASIC METABOLIC PANEL WITH GFR
Anion gap: 6 (ref 5–15)
BUN: 15 mg/dL (ref 6–20)
CO2: 28 mmol/L (ref 22–32)
Calcium: 8.9 mg/dL (ref 8.9–10.3)
Chloride: 104 mmol/L (ref 98–111)
Creatinine, Ser: 0.99 mg/dL (ref 0.44–1.00)
GFR, Estimated: >60 mL/min (ref >60)
Glucose, Bld: 119 mg/dL — ABNORMAL HIGH (ref 70–99)
Potassium: 4.2 mmol/L (ref 3.5–5.1)
Sodium: 138 mmol/L (ref 135–145)

## 2023-06-14 LAB — ECHOCARDIOGRAM COMPLETE
AR max vel: 1.56 cm2
AV Area VTI: 1.59 cm2
AV Area mean vel: 1.45 cm2
AV Mean grad: 17 mmHg
AV Peak grad: 28.9 mmHg
Ao pk vel: 2.69 m/s
Area-P 1/2: 2.29 cm2
Calc EF: 72.6 %
MV VTI: 2.93 cm2
S' Lateral: 3 cm
Single Plane A2C EF: 70.3 %
Single Plane A4C EF: 72.2 %

## 2023-06-14 NOTE — Progress Notes (Signed)
 Paramedicine Encounter    Patient ID: Leslie Walker, female    DOB: February 08, 1965, 58 y.o.   MRN: [id number]   Complaints NONE  Assessment A&O x 4, skin W&D w/ good color.  Denies chest pain or SOB.  Lung sounds clear and equal bilat.  No peripheral edema noted.  Compliance with meds YES  Pill box filled x 2 weeks  Refills needed Repatha -called in  Meds changes since last visit NONE    Social changes NONE   BP 120/78 (BP Location: Left Arm, Patient Position: Sitting, Cuff Size: Normal)   Pulse 85   Resp 16   Wt 151 lb 3.2 oz (68.6 kg)   LMP  (LMP Unknown) Comment: Pt unsure why she no longer gets her period  SpO2 98%   BMI 25.95 kg/m  Weight yesterday- Last visit weight-144.2lb  ATF Ms. Fennelly at home in the kitchen preparing dinner.  She had an ECHO earlier today.  She denies chest pain or SOB.  Lung sounds clear throughout and no peripheral edema noted.  She states, "I feel really good.  I picked up pt's refills and delivered them to her for her med rec today.  Pill box set up for 2 weeks.  Monday and Fridays contain 20mg . Furosemide  and 20mEq of Potassium.  She did have labs today and no med changes at this time.  ACTION: Home visit completed  Carlton Chick [phone] 06/14/23  Patient Care Team: Joaquin Mulberry, MD as PCP - General (Family Medicine) Hazle Lites, MD as PCP - Cardiology (Cardiology)  Patient Active Problem List   Diagnosis Date Noted   Severe aortic regurgitation 04/28/2023   Nonrheumatic aortic valve insufficiency 04/19/2022   Coronary artery disease 11/20/2021   Elevated troponin 11/19/2021   Heart failure (HCC) 11/19/2021   Acute on chronic systolic heart failure (HCC) 11/18/2021   Chronic systolic heart failure (HCC)    Chest pain, precordial 10/09/2020   Cocaine use 10/09/2020   NSTEMI (non-ST elevated myocardial infarction) (HCC) 10/09/2020   Polysubstance abuse (HCC) 10/09/2020   Hyperlipidemia 10/09/2020   Lung  nodule 10/09/2020   Tobacco dependence 10/09/2020   Screening breast examination 09/04/2018   Heroin dependence (HCC) 06/29/2018   TIA (transient ischemic attack) 06/24/2018   Nail fungus 04/20/2015   Porokeratosis 04/20/2015   History of ulceration 04/20/2015   Foot ulcer (HCC) 02/17/2014   Cellulitis of foot, right 02/17/2014   Chronic pain syndrome 02/17/2014   Essential hypertension 02/17/2014    Current Outpatient Medications:    aspirin  EC 81 MG tablet, Take 1 tablet (81 mg total) by mouth daily. Swallow whole., Disp: , Rfl:    atorvastatin  (LIPITOR) 40 MG tablet, Take 1 tablet (40 mg total) by mouth daily at 6 PM., Disp: 90 tablet, Rfl: 3   carvedilol  (COREG ) 12.5 MG tablet, Take 1 tablet (12.5 mg total) by mouth 2 (two) times daily with a meal., Disp: 60 tablet, Rfl: 1   clopidogrel  (PLAVIX ) 75 MG tablet, Take 1 tablet (75 mg total) by mouth daily., Disp: 90 tablet, Rfl: 3   empagliflozin  (JARDIANCE ) 10 MG TABS tablet, Take 1 tablet (10 mg total) by mouth daily., Disp: 30 tablet, Rfl: 3   Evolocumab  (REPATHA  SURECLICK) 140 MG/ML SOAJ, Inject 140 mg into the skin every 14 (fourteen) days., Disp: 6 mL, Rfl: 3   ezetimibe  (ZETIA ) 10 MG tablet, Take 1 tablet (10 mg total) by mouth daily., Disp: 90 tablet, Rfl: 3   furosemide  (LASIX ) 20 MG tablet, Take 1  tablet (20 mg total) by mouth 2 (two) times a week., Disp: 8 tablet, Rfl: 11   hydrOXYzine  (ATARAX ) 25 MG tablet, Take 1 tablet (25 mg total) by mouth at bedtime as needed., Disp: 30 tablet, Rfl: 1   potassium chloride  SA (KLOR-CON  M) 20 MEQ tablet, Take 1 tablet (20 mEq total) by mouth 2 (two) times a week., Disp: 8 tablet, Rfl: 11   spironolactone  (ALDACTONE ) 25 MG tablet, Take 0.5 tablets (12.5 mg total) by mouth daily., Disp: 30 tablet, Rfl: 1   Oxycodone  HCl 10 MG TABS, Take 1 tablet (10 mg total) by mouth every 6 (six) hours as needed for severe pain (pain score 7-10). (Patient not taking: Reported on 06/14/2023), Disp: 20 tablet,  Rfl: 0 No Known Allergies   Social History   Socioeconomic History   Marital status: Single    Spouse name: Not on file   Number of children: 3   Years of education: Not on file   Highest education level: 12th grade  Occupational History   Not on file  Tobacco Use   Smoking status: Some Days    Current packs/day: 1.00    Types: Cigars, Cigarettes   Smokeless tobacco: Never   Tobacco comments:    A cigarette or black n mild "once in a while"  Vaping Use   Vaping status: Never Used  Substance and Sexual Activity   Alcohol use: Not Currently    Comment: social drinking   Drug use: Not Currently    Types: Cocaine, Marijuana    Comment: no use in about 1 year (04/26/23)   Sexual activity: Not Currently    Birth control/protection: Post-menopausal  Other Topics Concern   Not on file  Social History Narrative   Not on file   Social Drivers of Health   Financial Resource Strain: High Risk (12/14/2021)   Overall Financial Resource Strain (CARDIA)    Difficulty of Paying Living Expenses: Hard  Food Insecurity: No Food Insecurity (05/09/2023)   Hunger Vital Sign    Worried About Running Out of Food in the Last Year: Never true    Ran Out of Food in the Last Year: Never true  Transportation Needs: No Transportation Needs (05/09/2023)   PRAPARE - Administrator, Civil Service (Medical): No    Lack of Transportation (Non-Medical): No  Physical Activity: Inactive (02/21/2017)   Exercise Vital Sign    Days of Exercise per Week: 0 days    Minutes of Exercise per Session: 0 min  Stress: No Stress Concern Present (02/21/2017)   [employer] of Occupational Health - Occupational Stress Questionnaire    Feeling of Stress : Not at all  Social Connections: Unknown (05/09/2023)   Social Connection and Isolation Panel [NHANES]    Frequency of Communication with Friends and Family: Twice a week    Frequency of Social Gatherings with Friends and Family: Twice a week    Attends  Religious Services: Not on Marketing executive or Organizations: Not on file    Attends Banker Meetings: Not on file    Marital Status: Not on file  Intimate Partner Violence: Not At Risk (05/09/2023)   Humiliation, Afraid, Rape, and Kick questionnaire    Fear of Current or Ex-Partner: No    Emotionally Abused: No    Physically Abused: No    Sexually Abused: No    Physical Exam      No future appointments.

## 2023-06-15 ENCOUNTER — Other Ambulatory Visit: Payer: Self-pay

## 2023-06-23 ENCOUNTER — Other Ambulatory Visit (HOSPITAL_COMMUNITY): Payer: Self-pay

## 2023-06-26 ENCOUNTER — Other Ambulatory Visit: Payer: Self-pay

## 2023-06-28 ENCOUNTER — Other Ambulatory Visit (HOSPITAL_COMMUNITY): Payer: Self-pay | Admitting: Cardiology

## 2023-06-28 ENCOUNTER — Other Ambulatory Visit (HOSPITAL_COMMUNITY): Payer: Self-pay

## 2023-06-28 ENCOUNTER — Other Ambulatory Visit: Payer: Self-pay

## 2023-06-28 ENCOUNTER — Other Ambulatory Visit: Payer: Self-pay | Admitting: Physician Assistant

## 2023-06-30 ENCOUNTER — Other Ambulatory Visit: Payer: Self-pay

## 2023-06-30 MED ORDER — EMPAGLIFLOZIN 10 MG PO TABS
10.0000 mg | ORAL_TABLET | Freq: Every day | ORAL | 3 refills | Status: DC
  Filled 2023-06-30 – 2023-07-13 (×2): qty 30, 30d supply, fill #0
  Filled 2023-08-08 – 2023-08-23 (×2): qty 30, 30d supply, fill #1
  Filled 2023-10-16: qty 30, 30d supply, fill #2

## 2023-07-07 ENCOUNTER — Other Ambulatory Visit: Payer: Self-pay

## 2023-07-13 ENCOUNTER — Other Ambulatory Visit (HOSPITAL_COMMUNITY): Payer: Self-pay | Admitting: Emergency Medicine

## 2023-07-13 ENCOUNTER — Other Ambulatory Visit: Payer: Self-pay

## 2023-07-13 NOTE — Progress Notes (Signed)
 Paramedicine Encounter    Patient ID: Leslie Walker, female    DOB: 1965/04/19, 58 y.o.   MRN: [id number]   Complaints NONE  Assessment A&O x 4, skin W&D w/ good color. Denies chest pain or SOB.   Lung sounds  clear and equal bilat. No edema noted.  BP elevated today- emotional upset due to family conflict.    Compliance with meds YES  Pill box filled x 2 weeks  Refills needed Carvedilol , Ezetimibe   Meds changes since last visit NONE    Social changes NONE   BP (!) 170/90 (BP Location: Right Arm, Patient Position: Sitting, Cuff Size: Normal)   Pulse 74   Resp 16   Wt 148 lb (67.1 kg)   LMP  (LMP Unknown) Comment: Pt unsure why she no longer gets her period  SpO2 99%   BMI 25.40 kg/m  Weight yesterday- Last visit weight-151lb   Leslie Walker was emotionally distraught during today's visit as she and her daughter were having disagreements.  Pt's BP was elevated.  She denies headache or visual disturbances.  I will see her tomorrow to reassess BP.  Sent message to HF Triage for refills and will p/u same tomorrow.  Also sent note regarding pt needing a note to return to work follow her valve replacement surgery.  ACTION: Home visit completed  [name] [phone] 07/14/23  Patient Care Team: Delbert Clam, MD as PCP - General (Family Medicine) Mona Vinie BROCKS, MD as PCP - Cardiology (Cardiology)  Patient Active Problem List   Diagnosis Date Noted   Severe aortic regurgitation 04/28/2023   Nonrheumatic aortic valve insufficiency 04/19/2022   Coronary artery disease 11/20/2021   Elevated troponin 11/19/2021   Heart failure (HCC) 11/19/2021   Acute on chronic systolic heart failure (HCC) 11/18/2021   Chronic systolic heart failure (HCC)    Chest pain, precordial 10/09/2020   Cocaine use 10/09/2020   NSTEMI (non-ST elevated myocardial infarction) (HCC) 10/09/2020   Polysubstance abuse (HCC) 10/09/2020   Hyperlipidemia 10/09/2020   Lung nodule  10/09/2020   Tobacco dependence 10/09/2020   Screening breast examination 09/04/2018   Heroin dependence (HCC) 06/29/2018   TIA (transient ischemic attack) 06/24/2018   Nail fungus 04/20/2015   Porokeratosis 04/20/2015   History of ulceration 04/20/2015   Foot ulcer (HCC) 02/17/2014   Cellulitis of foot, right 02/17/2014   Chronic pain syndrome 02/17/2014   Essential hypertension 02/17/2014    Current Outpatient Medications:    aspirin  EC 81 MG tablet, Take 1 tablet (81 mg total) by mouth daily. Swallow whole., Disp: , Rfl:    atorvastatin  (LIPITOR) 40 MG tablet, Take 1 tablet (40 mg total) by mouth daily at 6 PM., Disp: 90 tablet, Rfl: 3   clopidogrel  (PLAVIX ) 75 MG tablet, Take 1 tablet (75 mg total) by mouth daily., Disp: 90 tablet, Rfl: 3   empagliflozin  (JARDIANCE ) 10 MG TABS tablet, Take 1 tablet (10 mg total) by mouth daily., Disp: 30 tablet, Rfl: 3   Evolocumab  (REPATHA  SURECLICK) 140 MG/ML SOAJ, Inject 140 mg into the skin every 14 (fourteen) days., Disp: 6 mL, Rfl: 3   ezetimibe  (ZETIA ) 10 MG tablet, Take 1 tablet (10 mg total) by mouth daily., Disp: 90 tablet, Rfl: 3   hydrOXYzine  (ATARAX ) 25 MG tablet, Take 1 tablet (25 mg total) by mouth at bedtime as needed., Disp: 30 tablet, Rfl: 1   spironolactone  (ALDACTONE ) 25 MG tablet, Take 0.5 tablets (12.5 mg total) by mouth daily., Disp: 30 tablet, Rfl: 1  carvedilol  (COREG ) 12.5 MG tablet, Take 1 tablet (12.5 mg total) by mouth 2 (two) times daily with a meal., Disp: 60 tablet, Rfl: 1   furosemide  (LASIX ) 20 MG tablet, Take 1 tablet (20 mg total) by mouth 2 (two) times a week. (Patient not taking: Reported on 07/13/2023), Disp: 8 tablet, Rfl: 11   Oxycodone  HCl 10 MG TABS, Take 1 tablet (10 mg total) by mouth every 6 (six) hours as needed for severe pain (pain score 7-10). (Patient not taking: Reported on 07/13/2023), Disp: 20 tablet, Rfl: 0   potassium chloride  SA (KLOR-CON  M) 20 MEQ tablet, Take 1 tablet (20 mEq total) by mouth 2  (two) times a week. (Patient not taking: Reported on 07/13/2023), Disp: 8 tablet, Rfl: 11 No Known Allergies   Social History   Socioeconomic History   Marital status: Single    Spouse name: Not on file   Number of children: 3   Years of education: Not on file   Highest education level: 12th grade  Occupational History   Not on file  Tobacco Use   Smoking status: Some Days    Current packs/day: 1.00    Types: Cigars, Cigarettes   Smokeless tobacco: Never   Tobacco comments:    A cigarette or black n mild once in a while  Vaping Use   Vaping status: Never Used  Substance and Sexual Activity   Alcohol use: Not Currently    Comment: social drinking   Drug use: Not Currently    Types: Cocaine, Marijuana    Comment: no use in about 1 year (04/26/23)   Sexual activity: Not Currently    Birth control/protection: Post-menopausal  Other Topics Concern   Not on file  Social History Narrative   Not on file   Social Drivers of Health   Financial Resource Strain: High Risk (12/14/2021)   Overall Financial Resource Strain (CARDIA)    Difficulty of Paying Living Expenses: Hard  Food Insecurity: No Food Insecurity (05/09/2023)   Hunger Vital Sign    Worried About Running Out of Food in the Last Year: Never true    Ran Out of Food in the Last Year: Never true  Transportation Needs: No Transportation Needs (05/09/2023)   PRAPARE - Administrator, Civil Service (Medical): No    Lack of Transportation (Non-Medical): No  Physical Activity: Inactive (02/21/2017)   Exercise Vital Sign    Days of Exercise per Week: 0 days    Minutes of Exercise per Session: 0 min  Stress: No Stress Concern Present (02/21/2017)   [employer] of Occupational Health - Occupational Stress Questionnaire    Feeling of Stress : Not at all  Social Connections: Unknown (05/09/2023)   Social Connection and Isolation Panel    Frequency of Communication with Friends and Family: Twice a week     Frequency of Social Gatherings with Friends and Family: Twice a week    Attends Religious Services: Not on Insurance claims handler of Clubs or Organizations: Not on file    Attends Banker Meetings: Not on file    Marital Status: Not on file  Intimate Partner Violence: Not At Risk (05/09/2023)   Humiliation, Afraid, Rape, and Kick questionnaire    Fear of Current or Ex-Partner: No    Emotionally Abused: No    Physically Abused: No    Sexually Abused: No    Physical Exam      No future appointments.

## 2023-07-14 ENCOUNTER — Other Ambulatory Visit: Payer: Self-pay

## 2023-07-14 ENCOUNTER — Telehealth (HOSPITAL_COMMUNITY): Payer: Self-pay | Admitting: Cardiology

## 2023-07-14 ENCOUNTER — Other Ambulatory Visit (HOSPITAL_COMMUNITY): Payer: Self-pay

## 2023-07-14 MED ORDER — EZETIMIBE 10 MG PO TABS
10.0000 mg | ORAL_TABLET | Freq: Every day | ORAL | 3 refills | Status: DC
Start: 2023-07-14 — End: 2023-08-28
  Filled 2023-07-14 – 2023-08-08 (×2): qty 90, 90d supply, fill #0
  Filled 2023-08-08 – 2023-08-23 (×2): qty 30, 30d supply, fill #0

## 2023-07-14 MED ORDER — CARVEDILOL 12.5 MG PO TABS
12.5000 mg | ORAL_TABLET | Freq: Two times a day (BID) | ORAL | 1 refills | Status: DC
  Filled 2023-07-14: qty 60, 30d supply, fill #0

## 2023-07-14 NOTE — Telephone Encounter (Signed)
 front office left pt a voice message to schedule f/u appt with Dr. Rolan. Front office personnel received message from chantel jeffries, cma, that pt left voice message on triage nurse line to make an appt.

## 2023-07-18 ENCOUNTER — Telehealth (HOSPITAL_COMMUNITY): Payer: Self-pay

## 2023-07-18 NOTE — Telephone Encounter (Signed)
Pt is unable to do the cardiac rehab program. Closed referral.

## 2023-07-19 NOTE — Progress Notes (Signed)
 Advanced Heart Failure Clinic Note   PCP: Delbert Clam, MD HF Cardiologist: Dr. Rolan  Reason for Visit: F/u for HFpEF and post Hospital s/p AVR   Leslie Walker is a 58 y.o. with a history of HTN, hyperlipidemia, stroke cypotogenic 2020/LINQ placement, CAD, polysubstance abuse heroin/cocaine abuse, and chronic HFrEF.    Admitted 09/2020 with NSTEMI. CT negative for PE but did show multivessel coronary calcium . Echo showed EF 45-50%, RV normal, moderate aortic valve stenosis. Had cath with overlapping DES to mid RCA, also with 70-80% left circumflex, left main patent, mid LAD 55%. Plan for DAPT with Aspirin  + ticagrelor   x 12 months.     Admitted 11/23 with CHF.  3 months PTA she had run out of all medications. Says she didn't call for refills.   Last used cocaine 7 days prior to admission and heroin about 2 weeks prior. She was diuresed with IV lasix  and GDMT titrated. She was enrolled in paramedicine program. Echo with EF 20-25%, RV okay, moderate to severe AI with possible mobile mass. TEE showed EF 20%, mildly reduced RV, bicuspid aortic valve with moderate to severe AI.  R/LHC in 11/23 showed 75% mid LCx stenosis, elevated PCWP and LVEDP but normal RV pressure, moderate pulmonary venous hypertension, CI low by thermo but preserved by Fick.  cMRI in 11/23 showed LVEF 18%, RVEF 48%, bicuspid aortic valve with severe AI, mid-wall LGE in inferolateral wall could be due to prior myocarditis but patient has CAD in LCx chronically.  Structural heart team reviewed her studies, not felt to be a candidate for TAVR.  Echo 3/24: EF 50-55%, normal RV, bicuspid aortic valve with moderate AS and probably moderate AI.  TEE (4/24) showed LVEF 55%, normal RV, bicuspid AoV with fused noncoronary and right coronary cusps, mild-moderate AS with mean gradient 19 and AVA 1.57 cm^2, moderate to severe AI.  Echo 1/25: EF 55-60%, no RWMA, RV normal, small pericardial effusion present, trivial MR, bicuspic AV, AV  regurgitation mod-severe. Mild-mod AV stenosis. AV mean gradient 11 mmHg  [facility] 3/25 showed normal filling and PA pressures, preserved CI and 80% stenosis in the mid LCx.   S/p AV replacement 4/25. Post op course was relatively unremarkable.    She presents to clinic today for routine f/u. Doing well. Denies CP. No exertional dyspnea but reports chronic fatigue. BP elevated, 146/90. BPs also elevated at home perpara medicine notes, last home visit BP was 170/90.    ReDs 25%, normal    ECG: not performed    Labs (1/24): K 4.2, creatinine 1.08 Labs (3/24): K 4.3, creatinine 1.03, LDL 55 Labs (7/24): K 4.3, creatinine 0.97 Labs (4/25): K 4.1, creatinine 0.95  PMH: 1. Hyperlipidemia 2. CVA: 2020, cryptogenic.  LINQ monitor placed.  3. Polysubstance abuse: Cocaine, heroin.  4. CAD: NSTEMI 9/22 with DES to mid RCA.  - LHC (11/23): 75% mLCx, 50% pLAD.  5. Bicuspid aortic valve disorder:  - CTA chest 9/22 with no thoracic aortic aneurysm.  - TEE (11/23): Moderate-severe AI - Echo (3/24): Moderate AS with mean gradient 18/AVA 1.04 cm^2; moderate AI.  - Echo 1/25: EF 55-60%, no RWMA, RV normal, small pericardial effusion present, trivial MR, bicuspic AV, AV regurgitation mod-severe. Mild-mod AV stenosis. AV mean gradient 11 mmHg - s/p AoV replacement 4/25 6. Chronic systolic CHF: Nonischemic cardiomyopathy.  - RHC (11/23): mean RA 6, PA 51/29 mean 39, mean PCWP 32, CI 2.4 Fick with PVR 1.7 WU - cMRI (11/23): Moderate LV dilation with EF  18%, RV EF 48%, bicuspid aortic valve with severe AI, non-coronary LGE pattern with mid-wall LGE in the inferolateral wall (?prior myocarditis) though patient does have CAD in LCx.   - Echo (3/24): EF 50-55%, normal RV, bicuspid aortic valve with moderate AS and probably moderate AI.  - TEE (4/24): LVEF 55%, normal RV, no LAA thrombus, PFO or ASD, bicuspid AoV withfused noncoronary and right coronary cusps, mild-moderate AS with mean gradient 19 and AVA 1.57  cm^2, moderate to severe AI. - Echo (1/25): EF 55-60%, no RWMA, RV normal, small pericardial effusion present, trivial MR, bicuspic AV, AV regurgitation mod-severe. Mild-mod AV stenosis. AV mean gradient 11 mmHg - R/LHC (2/25): 80% stenosis in mid Cx; RA 5, PA 30/9 (18), PCWP 12, CO/CI (Fick) 4.98/2.99  ROS: All systems negative except as listed in HPI, PMH and Problem List.  Social History   Socioeconomic History   Marital status: Single    Spouse name: Not on file   Number of children: 3   Years of education: Not on file   Highest education level: 12th grade  Occupational History   Not on file  Tobacco Use   Smoking status: Some Days    Current packs/day: 1.00    Types: Cigars, Cigarettes   Smokeless tobacco: Never   Tobacco comments:    A cigarette or black n mild once in a while  Vaping Use   Vaping status: Never Used  Substance and Sexual Activity   Alcohol use: Not Currently    Comment: social drinking   Drug use: Not Currently    Types: Cocaine, Marijuana    Comment: no use in about 1 year (04/26/23)   Sexual activity: Not Currently    Birth control/protection: Post-menopausal  Other Topics Concern   Not on file  Social History Narrative   Not on file   Social Drivers of Health   Financial Resource Strain: High Risk (12/14/2021)   Overall Financial Resource Strain (CARDIA)    Difficulty of Paying Living Expenses: Hard  Food Insecurity: No Food Insecurity (05/09/2023)   Hunger Vital Sign    Worried About Running Out of Food in the Last Year: Never true    Ran Out of Food in the Last Year: Never true  Transportation Needs: No Transportation Needs (05/09/2023)   PRAPARE - Administrator, Civil Service (Medical): No    Lack of Transportation (Non-Medical): No  Physical Activity: Inactive (02/21/2017)   Exercise Vital Sign    Days of Exercise per Week: 0 days    Minutes of Exercise per Session: 0 min  Stress: No Stress Concern Present (02/21/2017)    [employer] of Occupational Health - Occupational Stress Questionnaire    Feeling of Stress : Not at all  Social Connections: Unknown (05/09/2023)   Social Connection and Isolation Panel    Frequency of Communication with Friends and Family: Twice a week    Frequency of Social Gatherings with Friends and Family: Twice a week    Attends Religious Services: Not on Insurance claims handler of Clubs or Organizations: Not on file    Attends Banker Meetings: Not on file    Marital Status: Not on file  Intimate Partner Violence: Not At Risk (05/09/2023)   Humiliation, Afraid, Rape, and Kick questionnaire    Fear of Current or Ex-Partner: No    Emotionally Abused: No    Physically Abused: No    Sexually Abused: No   FH:  Family  History  Problem Relation Age of Onset   Hypertension Mother    Hypertension Father    Hypertension Brother    Stroke Sister    Past Medical History:  Diagnosis Date   Chronic systolic heart failure (HCC)    Cocaine abuse (HCC)    last use about 1 year ago (04/26/23)   Coronary artery disease    Dyslipidemia    Hypertension    Noncompliance w/medication treatment due to intermit use of medication    Stroke (HCC) 2021   no deficits   Current Outpatient Medications  Medication Sig Dispense Refill   aspirin  EC 81 MG tablet Take 1 tablet (81 mg total) by mouth daily. Swallow whole.     atorvastatin  (LIPITOR) 40 MG tablet Take 1 tablet (40 mg total) by mouth daily at 6 PM. 90 tablet 3   carvedilol  (COREG ) 12.5 MG tablet Take 1 tablet (12.5 mg total) by mouth 2 (two) times daily with a meal. 60 tablet 1   clopidogrel  (PLAVIX ) 75 MG tablet Take 1 tablet (75 mg total) by mouth daily. 90 tablet 3   empagliflozin  (JARDIANCE ) 10 MG TABS tablet Take 1 tablet (10 mg total) by mouth daily. 30 tablet 3   Evolocumab  (REPATHA  SURECLICK) 140 MG/ML SOAJ Inject 140 mg into the skin every 14 (fourteen) days. 6 mL 3   ezetimibe  (ZETIA ) 10 MG tablet Take 1 tablet  (10 mg total) by mouth daily. 90 tablet 3   hydrOXYzine  (ATARAX ) 25 MG tablet Take 1 tablet (25 mg total) by mouth at bedtime as needed. 30 tablet 1   spironolactone  (ALDACTONE ) 25 MG tablet Take 0.5 tablets (12.5 mg total) by mouth daily. 30 tablet 1   No current facility-administered medications for this encounter.   BP (!) 146/90   Pulse 82   Wt 66.5 kg (146 lb 9.6 oz)   LMP  (LMP Unknown) Comment: Pt unsure why she no longer gets her period  SpO2 97%   BMI 25.16 kg/m   Wt Readings from Last 3 Encounters:  07/20/23 66.5 kg (146 lb 9.6 oz)  07/13/23 67.1 kg (148 lb)  06/14/23 68.6 kg (151 lb 3.2 oz)   PHYSICAL EXAM: ReDs 25%  General:  Well appearing. No respiratory difficulty HEENT: normal Neck: supple. no JVD. Carotids 2+ bilat; no bruits. No lymphadenopathy or thyromegaly appreciated. Cor: PMI nondisplaced. Regular rate & rhythm. 2/6 SEM RUSB  Lungs: clear Abdomen: soft, nontender, nondistended. No hepatosplenomegaly. No bruits or masses. Good bowel sounds. Extremities: no cyanosis, clubbing, rash, edema Neuro: alert & oriented x 3, cranial nerves grossly intact. moves all 4 extremities w/o difficulty. Affect pleasant.  ASSESSMENT & PLAN:  1. Chronic systolic CHF: Nonischemic CMP.  Echo 11/23 with EF 20-25%, global hypokinesis, moderate LV dilation, normal RV, mod-severe AI.  RHC showed significantly elevated PCWP and LVEDP but normal RA pressure; CI 2.4 Fick/2.0 thermo.  Cause of cardiomyopathy uncertain, CAD does not appear to have progressed (75% mLCx stenosis on 11/23 cath).  Possibly some component of substance abuse (cocaine) though by cardiac MRI in 11/23, prior myocarditis is also a concern (non-coronary LGE noted).  LV EF 18% on cMRI with relatively preserved RV function.  Aortic insufficiency also likely plays a role (moderate to severe on TEE in 11/23).  Repeat echo 3/24 showed improvement with EF 50-55%, normal RV, bicuspid aortic valve with moderate AS and moderate  AI. Echo 1/25 EF 55-60%, RV normal, moderate/severe AI. Improvement in EF suggests a role for cocaine in her  cardiomyopathy (she had quit using drugs).  Now s/p AVR 4/25. Echo 5/25 EF 75%, RV mildly reduced. Euvolemic on exam and by ReDs, 25%, NYHA II.   - Off Entresto , did not tolerate w/ hypotension.BP now elevated. I discussed retrial, but she is hesitant, says she did not feel well w/ it    - start Losartan  25 mg daily. PharmD visit in 4 wks. If BP still elevated, increase to 50 mg daily   - Continue Coreg  12.5 mg bid. No up titration w/ chronic fatigue     - Continue Jardiance  10 mg daily  - Continue spironolactone  12.5 mg daily  - Check BMP today  2. CAD: DES to RCA in 9/22.  Cath in 11/23 with patent RCA stent, 50% proximal LAD, 75% mid LCx.  The LCx stenosis appeared similar to the prior cath, no intervention on LCx . Lp(a) markedly elevated with age-advanced CAD. Stable w/o CP  - Continue Plavix  75 mg daily  - Continue atorvastatin  40 mg daily  - With elevated Lp(a), will need to be aggressive with lipid management.  LDL 74 11/24, she is on Repatha  + Zetia . 3. Aortic valve disorder: Moderate-severe AI on 11/23 echo and TEE. TEE showed bicuspid aortic valve with fusion of right and noncoronary cusps.  Moderate-severe AI by TEE.  By cardiac MRI, aortic valve regurgitant fraction was 38% which suggests severe AI.  She was deemed not to be a TAVR candidate, but with improvement in LV function, she should be SAVR candidate. TEE in 4/24 showed mild-moderate AS with mean gradient 19 and AVA 1.57 cm^2, moderate to severe AI. Echo 1/25: bicuspic AV, AV regurgitation mod-severe. Mild-mod AV stenosis. AV mean gradient 11 mmHg. Now s/p AV replacement 04/2023. Echo 5/25 w/ stable prosthesis, mean gradient 17 mmHg - Recommended screening in 1st degree relatives for bicuspid aortic valve - Refer to Cardiac Rehab. They have contacted her. I encouraged there to get enrolled w/ program  4. Polysubstance abuse:  Cocaine/heroin. She reports abstinence and EF has improved.  - Congratulated 5. H/o CVA: She is on Plavix . 6. Smoking: Quit since hospitalization - Congratulated.  7. SDOH: Continue paramedicine, appreciate their assistance  F/u w/ PharmD in 4 wks, f/u Dr. Rolan in 3 months    Crytal Pensinger Marcine PA-C  07/20/2023

## 2023-07-20 ENCOUNTER — Other Ambulatory Visit: Payer: Self-pay

## 2023-07-20 ENCOUNTER — Encounter (HOSPITAL_COMMUNITY): Payer: Self-pay

## 2023-07-20 ENCOUNTER — Ambulatory Visit (HOSPITAL_COMMUNITY): Payer: Self-pay | Admitting: Cardiology

## 2023-07-20 ENCOUNTER — Telehealth (HOSPITAL_COMMUNITY): Payer: Self-pay

## 2023-07-20 ENCOUNTER — Ambulatory Visit (HOSPITAL_COMMUNITY)
Admission: RE | Admit: 2023-07-20 | Discharge: 2023-07-20 | Disposition: A | Discharge status: Home / Self Care | Source: Ambulatory Visit | Attending: Cardiology | Admitting: Cardiology

## 2023-07-20 VITALS — BP 146/90 | HR 82 | Wt 146.6 lb

## 2023-07-20 DIAGNOSIS — I110 Hypertensive heart disease with heart failure: Secondary | ICD-10-CM | POA: Insufficient documentation

## 2023-07-20 DIAGNOSIS — Z955 Presence of coronary angioplasty implant and graft: Secondary | ICD-10-CM | POA: Insufficient documentation

## 2023-07-20 DIAGNOSIS — Z79899 Other long term (current) drug therapy: Secondary | ICD-10-CM | POA: Diagnosis not present

## 2023-07-20 DIAGNOSIS — I5022 Chronic systolic (congestive) heart failure: Secondary | ICD-10-CM

## 2023-07-20 DIAGNOSIS — Q2381 Bicuspid aortic valve: Secondary | ICD-10-CM | POA: Insufficient documentation

## 2023-07-20 DIAGNOSIS — Z7984 Long term (current) use of oral hypoglycemic drugs: Secondary | ICD-10-CM | POA: Insufficient documentation

## 2023-07-20 DIAGNOSIS — Z7902 Long term (current) use of antithrombotics/antiplatelets: Secondary | ICD-10-CM | POA: Diagnosis not present

## 2023-07-20 DIAGNOSIS — E785 Hyperlipidemia, unspecified: Secondary | ICD-10-CM | POA: Diagnosis not present

## 2023-07-20 DIAGNOSIS — I428 Other cardiomyopathies: Secondary | ICD-10-CM | POA: Insufficient documentation

## 2023-07-20 DIAGNOSIS — Z87891 Personal history of nicotine dependence: Secondary | ICD-10-CM | POA: Insufficient documentation

## 2023-07-20 DIAGNOSIS — F1911 Other psychoactive substance abuse, in remission: Secondary | ICD-10-CM | POA: Diagnosis not present

## 2023-07-20 DIAGNOSIS — Z8673 Personal history of transient ischemic attack (TIA), and cerebral infarction without residual deficits: Secondary | ICD-10-CM | POA: Diagnosis not present

## 2023-07-20 DIAGNOSIS — I351 Nonrheumatic aortic (valve) insufficiency: Secondary | ICD-10-CM | POA: Diagnosis not present

## 2023-07-20 DIAGNOSIS — I2510 Atherosclerotic heart disease of native coronary artery without angina pectoris: Secondary | ICD-10-CM | POA: Insufficient documentation

## 2023-07-20 LAB — BASIC METABOLIC PANEL WITH GFR
Anion gap: 9 (ref 5–15)
BUN: 17 mg/dL (ref 6–20)
CO2: 23 mmol/L (ref 22–32)
Calcium: 8.8 mg/dL — ABNORMAL LOW (ref 8.9–10.3)
Chloride: 105 mmol/L (ref 98–111)
Creatinine, Ser: 1.07 mg/dL — ABNORMAL HIGH (ref 0.44–1.00)
GFR, Estimated: >60 mL/min (ref >60)
Glucose, Bld: 117 mg/dL — ABNORMAL HIGH (ref 70–99)
Potassium: 4.1 mmol/L (ref 3.5–5.1)
Sodium: 137 mmol/L (ref 135–145)

## 2023-07-20 MED ORDER — LOSARTAN POTASSIUM 25 MG PO TABS
25.0000 mg | ORAL_TABLET | Freq: Every day | ORAL | 3 refills | Status: DC
  Filled 2023-07-20: qty 90, 90d supply, fill #0

## 2023-07-20 MED ORDER — FUROSEMIDE 20 MG PO TABS
20.0000 mg | ORAL_TABLET | ORAL | 4 refills | Status: AC | PRN
  Filled 2023-07-20: qty 8, 8d supply, fill #0

## 2023-07-20 NOTE — Progress Notes (Signed)
 ReDS Vest / Clip - 07/20/23 1000       ReDS Vest / Clip   Station Marker A    Ruler Value 28    ReDS Value Range Low volume    ReDS Actual Value 25

## 2023-07-20 NOTE — Telephone Encounter (Signed)
 Pt returned phone call and stated that she is interested in scheduling for cardiac rehab that she has changed her mind. Pt will come in for orientation on 7/10@1030  and will attend the 1:45 exercise class time.   Sent packet.

## 2023-07-20 NOTE — Telephone Encounter (Signed)
 Pt insurance is active and benefits verified through [employer] Co-pay $4, DED 0/0 met, out of pocket 0/0 met, co-insurance 0%. no pre-authorization requiredTCR ONLY. Judeen/[employer] 07/20/2023@1032 , REF# [id number]    How many CR sessions are covered? (TCR ONLY)no limit Is this a lifetime maximum or an annual maximum? annual Has the member used any of these services to date? no Is there a time limit (weeks/months) on start of program and/or program completion? no

## 2023-07-20 NOTE — Telephone Encounter (Signed)
 Pt aware, agreeable, and verbalized understanding  Labs ordered and scheduled

## 2023-07-20 NOTE — Patient Instructions (Addendum)
 START Losartan  25 mg daily.  Labs done today, your results will be available in MyChart, we will contact you for abnormal readings.  Please follow up with our heart failure pharmacist in 4 weeks  Your physician recommends that you schedule a follow-up appointment 2 months  If you have any questions or concerns before your next appointment please send us  a message through [place] or call our office at [phone].    TO LEAVE A MESSAGE FOR THE NURSE SELECT OPTION 2, PLEASE LEAVE A MESSAGE INCLUDING: YOUR NAME DATE OF BIRTH CALL BACK NUMBER REASON FOR CALL**this is important as we prioritize the call backs  YOU WILL RECEIVE A CALL BACK THE SAME DAY AS LONG AS YOU CALL BEFORE 4:00 PM  At the Advanced [facility] Failure Clinic, you and your health needs are our priority. As part of our continuing mission to provide you with exceptional heart care, we have created designated Provider Care Teams. These Care Teams include your primary Cardiologist (physician) and Advanced Practice Providers (APPs- Physician Assistants and Nurse Practitioners) who all work together to provide you with the care you need, when you need it.   You may see any of the following providers on your designated Care Team at your next follow up: Dr Toribio Fuel Dr Ezra Shuck Dr. Ria Commander Dr. Morene Brownie Amy Lenetta, NP Caffie Shed, [place] [facility] [place], [place] Beckey Coe, NP [place] Lee, NP Ellouise Class, NP Tinnie Redman, PharmD Jaun Bash, PharmD   Please be sure to bring in all your medications bottles to every appointment.    Thank you for choosing [facility]

## 2023-07-27 ENCOUNTER — Other Ambulatory Visit (HOSPITAL_COMMUNITY)

## 2023-07-27 ENCOUNTER — Encounter (HOSPITAL_COMMUNITY)
Admission: RE | Admit: 2023-07-27 | Discharge: 2023-07-27 | Disposition: A | Discharge status: Home / Self Care | Source: Ambulatory Visit | Attending: Cardiology | Admitting: Cardiology

## 2023-07-27 VITALS — BP 132/80 | HR 59 | Ht 64.0 in | Wt 151.2 lb

## 2023-07-27 DIAGNOSIS — I5022 Chronic systolic (congestive) heart failure: Secondary | ICD-10-CM | POA: Insufficient documentation

## 2023-07-27 DIAGNOSIS — Z952 Presence of prosthetic heart valve: Secondary | ICD-10-CM

## 2023-07-27 NOTE — Progress Notes (Signed)
 Cardiac Rehab Medication Review   Does the patient  feel that his/her medications are working for him/her? Yes    Has the patient been experiencing any side effects to the medications prescribed? No  Does the patient measure his/her own blood pressure or blood glucose at home?   Yes  Does the patient have any problems obtaining medications due to transportation or finances?   No  Understanding of regimen: excellent Understanding of indications: excellent Potential of compliance: excellent    Comments: Leslie Walker understands her medications and regime well. She has an EMT that visits every 2 weeks to help with medications and blood pressure. She also has a BP cuff at home.    Con Leslie Pereyra, MS, ACSM-CEP 07/27/2023 10:56 AM

## 2023-07-27 NOTE — Progress Notes (Signed)
 Cardiac Individual Treatment Plan  Patient Details  Name: Leslie Walker MRN: [id number] Date of Birth: February 22, 1965 Referring Provider:   Flowsheet Row CARDIAC REHAB PHASE II ORIENTATION from 07/27/2023 in [facility]  Referring Provider Ezra Shuck, MD    Initial Encounter Date:  Flowsheet Row CARDIAC REHAB PHASE II ORIENTATION from 07/27/2023 in [facility] for Heart, Vascular, [facility] Lung [facility]  Date 07/27/23    Visit Diagnosis: 04/28/23 S/P aortic valve replacement  Patient's Home Medications on Admission:  Current Outpatient Medications:    aspirin  EC 81 MG tablet, Take 1 tablet (81 mg total) by mouth daily. Swallow whole., Disp: , Rfl:    atorvastatin  (LIPITOR) 40 MG tablet, Take 1 tablet (40 mg total) by mouth daily at 6 PM., Disp: 90 tablet, Rfl: 3   carvedilol  (COREG ) 12.5 MG tablet, Take 1 tablet (12.5 mg total) by mouth 2 (two) times daily with a meal., Disp: 60 tablet, Rfl: 1   clopidogrel  (PLAVIX ) 75 MG tablet, Take 1 tablet (75 mg total) by mouth daily., Disp: 90 tablet, Rfl: 3   empagliflozin  (JARDIANCE ) 10 MG TABS tablet, Take 1 tablet (10 mg total) by mouth daily., Disp: 30 tablet, Rfl: 3   Evolocumab  (REPATHA  SURECLICK) 140 MG/ML SOAJ, Inject 140 mg into the skin every 14 (fourteen) days., Disp: 6 mL, Rfl: 3   ezetimibe  (ZETIA ) 10 MG tablet, Take 1 tablet (10 mg total) by mouth daily., Disp: 90 tablet, Rfl: 3   furosemide  (LASIX ) 20 MG tablet, Take 1 tablet (20 mg total) by mouth as needed for fluid or edema. For weight gain of 3lb in 24 hours or 5lb in a week., Disp: 8 tablet, Rfl: 4   hydrOXYzine  (ATARAX ) 25 MG tablet, Take 1 tablet (25 mg total) by mouth at bedtime as needed., Disp: 30 tablet, Rfl: 1   losartan  (COZAAR ) 25 MG tablet, Take 1 tablet (25 mg total) by mouth daily., Disp: 90 tablet, Rfl: 3   spironolactone  (ALDACTONE ) 25 MG tablet, Take 0.5 tablets (12.5 mg total) by mouth  daily., Disp: 30 tablet, Rfl: 1  Past Medical History: Past Medical History:  Diagnosis Date   Chronic systolic heart failure (HCC)    Cocaine abuse (HCC)    last use about 1 year ago (04/26/23)   Coronary artery disease    Dyslipidemia    Hypertension    Noncompliance w/medication treatment due to intermit use of medication    Stroke (HCC) 2021   no deficits    Tobacco Use: Social History   Tobacco Use  Smoking Status Some Days   Current packs/day: 1.00   Types: Cigars, Cigarettes  Smokeless Tobacco Never  Tobacco Comments   A cigarette or black n mild once in a while    Labs: Review Flowsheet  More data exists      Latest Ref Rng & Units 04/04/2022 12/07/2022 04/05/2023 04/26/2023 04/28/2023  Labs for ITP Cardiac and Pulmonary Rehab  Cholestrol 0 - 200 mg/dL 884  863  - - -  LDL (calc) 0 - 99 mg/dL 55  74  - - -  HDL-C >59 mg/dL 44  51  - - -  Trlycerides <150 mg/dL 78  56  - - -  Hemoglobin A1c 4.8 - 5.6 % - - - 5.5  -  PH, Arterial 7.35 - 7.45 - - - - 7.371  7.376  7.418  7.403  7.426  7.357   PCO2 arterial 32 -  48 mmHg - - - - 40.4  39.0  40.0  40.6  35.4  42.6   Bicarbonate 20.0 - 28.0 mmol/L - - 27.3  25.9  - 23.2  22.8  23.0  25.8  25.3  30.0  23.2  23.9   TCO2 22 - 32 mmol/L - - 29  27  - 24  24  24  27  27  27  27  31  24  26  25  28    Acid-base deficit 0.0 - 2.0 mmol/L - - - - 2.0  2.0  2.0  1.0  2.0   O2 Saturation % - - 74  74  - 100  100  100  100  100  84  100  100     Details       Multiple values from one day are sorted in reverse-chronological order         Capillary Blood Glucose: Lab Results  Component Value Date   GLUCAP 95 05/01/2023   GLUCAP 98 05/01/2023   GLUCAP 131 (H) 04/30/2023   GLUCAP 102 (H) 04/30/2023   GLUCAP 139 (H) 04/30/2023     Exercise Target Goals: Exercise Program Goal: Individual exercise prescription set using results from initial 6 min walk test and THRR while considering  patient's activity barriers and safety.    Exercise Prescription Goal: Initial exercise prescription builds to 30-45 minutes a day of aerobic activity, 2-3 days per week.  Home exercise guidelines will be given to patient during program as part of exercise prescription that the participant will acknowledge.  Activity Barriers & Risk Stratification:  Activity Barriers & Cardiac Risk Stratification - 07/27/23 1124       Activity Barriers & Cardiac Risk Stratification   Activity Barriers Decreased Ventricular Function;Joint Problems    Cardiac Risk Stratification High   <5 METs on         6 Minute Walk:  6 Minute Walk     Row Name 07/27/23 1412         6 Minute Walk   Phase Initial     Distance 1495 feet     Walk Time 6 minutes     # of Rest Breaks 0     MPH 2.83     METS 4.04     RPE 9     Perceived Dyspnea  0     VO2 Peak 14.14     Symptoms No     Resting HR 59 bpm     Resting BP 132/80     Resting Oxygen Saturation  100 %     Exercise Oxygen Saturation  during 6 min walk 100 %     Max Ex. HR 86 bpm     Max Ex. BP 168/90     2 Minute Post BP 154/84        Oxygen Initial Assessment:   Oxygen Re-Evaluation:   Oxygen Discharge (Final Oxygen Re-Evaluation):   Initial Exercise Prescription:  Initial Exercise Prescription - 07/27/23 1100       Date of Initial Exercise RX and Referring Provider   Date 07/27/23    Referring Provider Ezra Shuck, MD    Expected Discharge Date 09/06/23      Recumbant Elliptical   Level 1    RPM 55    Watts 70    Minutes 15    METs 2.5      Track   Laps 17    Minutes 15  METs 2.3      Prescription Details   Frequency (times per week) 3    Duration Progress to 30 minutes of continuous aerobic without signs/symptoms of physical distress      Intensity   THRR 40-80% of Max Heartrate 65-130    Ratings of Perceived Exertion 11-13    Perceived Dyspnea 0-4      Progression   Progression Continue progressive overload as per policy without  signs/symptoms or physical distress.      Resistance Training   Training Prescription Yes    Weight 2    Reps 10-15          Perform Capillary Blood Glucose checks as needed.  Exercise Prescription Changes:   Exercise Comments:   Exercise Goals and Review:   Exercise Goals     Row Name 07/27/23 1026             Exercise Goals   Increase Physical Activity Yes       Intervention Provide advice, education, support and counseling about physical activity/exercise needs.;Develop an individualized exercise prescription for aerobic and resistive training based on initial evaluation findings, risk stratification, comorbidities and participant's personal goals.       Expected Outcomes Short Term: Attend rehab on a regular basis to increase amount of physical activity.;Long Term: Exercising regularly at least 3-5 days a week.;Long Term: Add in home exercise to make exercise part of routine and to increase amount of physical activity.       Increase Strength and Stamina Yes       Intervention Provide advice, education, support and counseling about physical activity/exercise needs.;Develop an individualized exercise prescription for aerobic and resistive training based on initial evaluation findings, risk stratification, comorbidities and participant's personal goals.       Expected Outcomes Short Term: Increase workloads from initial exercise prescription for resistance, speed, and METs.;Short Term: Perform resistance training exercises routinely during rehab and add in resistance training at home;Long Term: Improve cardiorespiratory fitness, muscular endurance and strength as measured by increased METs and functional capacity ( )       Able to understand and use rate of perceived exertion (RPE) scale Yes       Intervention Provide education and explanation on how to use RPE scale       Expected Outcomes Short Term: Able to use RPE daily in rehab to express subjective intensity  level;Long Term:  Able to use RPE to guide intensity level when exercising independently       Knowledge and understanding of Target Heart Rate Range (THRR) Yes       Intervention Provide education and explanation of THRR including how the numbers were predicted and where they are located for reference       Expected Outcomes Short Term: Able to state/look up THRR;Long Term: Able to use THRR to govern intensity when exercising independently;Short Term: Able to use daily as guideline for intensity in rehab       Understanding of Exercise Prescription Yes       Intervention Provide education, explanation, and written materials on patient's individual exercise prescription       Expected Outcomes Short Term: Able to explain program exercise prescription;Long Term: Able to explain home exercise prescription to exercise independently          Exercise Goals Re-Evaluation :   Discharge Exercise Prescription (Final Exercise Prescription Changes):   Nutrition:  Target Goals: Understanding of nutrition guidelines, daily intake of sodium 1500mg , cholesterol 200mg , calories 30%  from fat and 7% or less from saturated fats, daily to have 5 or more servings of fruits and vegetables.  Biometrics:  Pre Biometrics - 07/27/23 1025       Pre Biometrics   Waist Circumference 36 inches    Hip Circumference 41 inches    Waist to Hip Ratio 0.88 %    Triceps Skinfold 15 mm    % Body Fat 34.3 %    Grip Strength 26 kg    Flexibility 14.75 in    Single Leg Stand 4.6 seconds           Nutrition Therapy Plan and Nutrition Goals:   Nutrition Assessments:  MEDIFICTS Score Key: >=70 Need to make dietary changes  40-70 Heart Healthy Diet <= 40 Therapeutic Level Cholesterol Diet    Picture Your Plate Scores: <59 Unhealthy dietary pattern with much room for improvement. 41-50 Dietary pattern unlikely to meet recommendations for good health and room for improvement. 51-60 More healthful dietary  pattern, with some room for improvement.  >60 Healthy dietary pattern, although there may be some specific behaviors that could be improved.    Nutrition Goals Re-Evaluation:   Nutrition Goals Re-Evaluation:   Nutrition Goals Discharge (Final Nutrition Goals Re-Evaluation):   Psychosocial: Target Goals: Acknowledge presence or absence of significant depression and/or stress, maximize coping skills, provide positive support system. Participant is able to verbalize types and ability to use techniques and skills needed for reducing stress and depression.  Initial Review & Psychosocial Screening:  Initial Psych Review & Screening - 07/27/23 1126       Initial Review   Current issues with None Identified      Family Dynamics   Good Support System? Yes   daughter     Barriers   Psychosocial barriers to participate in program There are no identifiable barriers or psychosocial needs.      Screening Interventions   Interventions Encouraged to exercise;Provide feedback about the scores to participant    Expected Outcomes Long Term goal: The participant improves quality of Life and PHQ9 Scores as seen by post scores and/or verbalization of changes;Short Term goal: Identification and review with participant of any Quality of Life or Depression concerns found by scoring the questionnaire.          Quality of Life Scores:  Quality of Life - 07/27/23 1148       Quality of Life   Select Quality of Life      Quality of Life Scores   Health/Function Pre 25.17 %    Socioeconomic Pre 30 %    Psych/Spiritual Pre 30 %    Family Pre 30 %    GLOBAL Pre 27.93 %         Scores of 19 and below usually indicate a poorer quality of life in these areas.  A difference of  2-3 points is a clinically meaningful difference.  A difference of 2-3 points in the total score of the Quality of Life Index has been associated with significant improvement in overall quality of life, self-image, physical  symptoms, and general health in studies assessing change in quality of life.  PHQ-9: Review Flowsheet       07/27/2023 05/09/2023 12/23/2021  Depression screen PHQ 2/9  Decreased Interest 0 0 1  Down, Depressed, Hopeless 0 0 0  PHQ - 2 Score 0 0 1  Altered sleeping 0 0 0  Tired, decreased energy 1 0 1  Change in appetite 0 2 0  Feeling  bad or failure about yourself  0 0 0  Trouble concentrating 0 0 0  Moving slowly or fidgety/restless 0 1 0  Suicidal thoughts 0 0 0  PHQ-9 Score 1 3 2   Difficult doing work/chores Not difficult at all - Not difficult at all   Interpretation of Total Score  Total Score Depression Severity:  1-4 = Minimal depression, 5-9 = Mild depression, 10-14 = Moderate depression, 15-19 = Moderately severe depression, 20-27 = Severe depression   Psychosocial Evaluation and Intervention:   Psychosocial Re-Evaluation:   Psychosocial Discharge (Final Psychosocial Re-Evaluation):   Vocational Rehabilitation: Provide vocational rehab assistance to qualifying candidates.   Vocational Rehab Evaluation & Intervention:  Vocational Rehab - 07/27/23 1126       Initial Vocational Rehab Evaluation & Intervention   Assessment shows need for Vocational Rehabilitation No   works at [employer]: Education Goals: Education classes will be provided on a weekly basis, covering required topics. Participant will state understanding/return demonstration of topics presented.     Core Videos: Exercise    Move It!  Clinical staff conducted group or individual video education with verbal and written material and guidebook.  Patient learns the recommended Pritikin exercise program. Exercise with the goal of living a long, healthy life. Some of the health benefits of exercise include controlled diabetes, healthier blood pressure levels, improved cholesterol levels, improved heart and lung capacity, improved sleep, and better body composition. Everyone should  speak with their doctor before starting or changing an exercise routine.  Biomechanical Limitations Clinical staff conducted group or individual video education with verbal and written material and guidebook.  Patient learns how biomechanical limitations can impact exercise and how we can mitigate and possibly overcome limitations to have an impactful and balanced exercise routine.  Body Composition Clinical staff conducted group or individual video education with verbal and written material and guidebook.  Patient learns that body composition (ratio of muscle mass to fat mass) is a key component to assessing overall fitness, rather than body weight alone. Increased fat mass, especially visceral belly fat, can put us  at increased risk for metabolic syndrome, type 2 diabetes, heart disease, and even death. It is recommended to combine diet and exercise (cardiovascular and resistance training) to improve your body composition. Seek guidance from your physician and exercise physiologist before implementing an exercise routine.  Exercise Action Plan Clinical staff conducted group or individual video education with verbal and written material and guidebook.  Patient learns the recommended strategies to achieve and enjoy long-term exercise adherence, including variety, self-motivation, self-efficacy, and positive decision making. Benefits of exercise include fitness, good health, weight management, more energy, better sleep, less stress, and overall well-being.  Medical   Heart Disease Risk Reduction Clinical staff conducted group or individual video education with verbal and written material and guidebook.  Patient learns our heart is our most vital organ as it circulates oxygen, nutrients, white blood cells, and hormones throughout the entire body, and carries waste away. Data supports a plant-based eating plan like the Pritikin Program for its effectiveness in slowing progression of and reversing heart  disease. The video provides a number of recommendations to address heart disease.   Metabolic Syndrome and Belly Fat  Clinical staff conducted group or individual video education with verbal and written material and guidebook.  Patient learns what metabolic syndrome is, how it leads to heart disease, and how one can reverse it and keep it from coming back. You  have metabolic syndrome if you have 3 of the following 5 criteria: abdominal obesity, high blood pressure, high triglycerides, low HDL cholesterol, and high blood sugar.  Hypertension and Heart Disease Clinical staff conducted group or individual video education with verbal and written material and guidebook.  Patient learns that high blood pressure, or hypertension, is very common in the [place] . Hypertension is largely due to excessive salt intake, but other important risk factors include being overweight, physical inactivity, drinking too much alcohol, smoking, and not eating enough potassium from fruits and vegetables. High blood pressure is a leading risk factor for heart attack, stroke, congestive heart failure, dementia, kidney failure, and premature death. Long-term effects of excessive salt intake include stiffening of the arteries and thickening of heart muscle and organ damage. Recommendations include ways to reduce hypertension and the risk of heart disease.  Diseases of Our Time - Focusing on Diabetes Clinical staff conducted group or individual video education with verbal and written material and guidebook.  Patient learns why the best way to stop diseases of our time is prevention, through food and other lifestyle changes. Medicine (such as prescription pills and surgeries) is often only a Band-Aid on the problem, not a long-term solution. Most common diseases of our time include obesity, type 2 diabetes, hypertension, heart disease, and cancer. The Pritikin Program is recommended and has been proven to help reduce, reverse,  and/or prevent the damaging effects of metabolic syndrome.  Nutrition   Overview of the Pritikin Eating Plan  Clinical staff conducted group or individual video education with verbal and written material and guidebook.  Patient learns about the Pritikin Eating Plan for disease risk reduction. The Pritikin Eating Plan emphasizes a wide variety of unrefined, minimally-processed carbohydrates, like fruits, vegetables, whole grains, and legumes. Go, Caution, and Stop food choices are explained. Plant-based and lean animal proteins are emphasized. Rationale provided for low sodium intake for blood pressure control, low added sugars for blood sugar stabilization, and low added fats and oils for coronary artery disease risk reduction and weight management.  Calorie Density  Clinical staff conducted group or individual video education with verbal and written material and guidebook.  Patient learns about calorie density and how it impacts the Pritikin Eating Plan. Knowing the characteristics of the food you choose will help you decide whether those foods will lead to weight gain or weight loss, and whether you want to consume more or less of them. Weight loss is usually a side effect of the Pritikin Eating Plan because of its focus on low calorie-dense foods.  Label Reading  Clinical staff conducted group or individual video education with verbal and written material and guidebook.  Patient learns about the Pritikin recommended label reading guidelines and corresponding recommendations regarding calorie density, added sugars, sodium content, and whole grains.  Dining Out - Part 1  Clinical staff conducted group or individual video education with verbal and written material and guidebook.  Patient learns that restaurant meals can be sabotaging because they can be so high in calories, fat, sodium, and/or sugar. Patient learns recommended strategies on how to positively address this and avoid unhealthy  pitfalls.  Facts on Fats  Clinical staff conducted group or individual video education with verbal and written material and guidebook.  Patient learns that lifestyle modifications can be just as effective, if not more so, as many medications for lowering your risk of heart disease. A Pritikin lifestyle can help to reduce your risk of inflammation and atherosclerosis (cholesterol build-up, or  plaque, in the artery walls). Lifestyle interventions such as dietary choices and physical activity address the cause of atherosclerosis. A review of the types of fats and their impact on blood cholesterol levels, along with dietary recommendations to reduce fat intake is also included.  Nutrition Action Plan  Clinical staff conducted group or individual video education with verbal and written material and guidebook.  Patient learns how to incorporate Pritikin recommendations into their lifestyle. Recommendations include planning and keeping personal health goals in mind as an important part of their success.  Healthy Mind-Set    Healthy Minds, Bodies, Hearts  Clinical staff conducted group or individual video education with verbal and written material and guidebook.  Patient learns how to identify when they are stressed. Video will discuss the impact of that stress, as well as the many benefits of stress management. Patient will also be introduced to stress management techniques. The way we think, act, and feel has an impact on our hearts.  How Our Thoughts Can Heal Our Hearts  Clinical staff conducted group or individual video education with verbal and written material and guidebook.  Patient learns that negative thoughts can cause depression and anxiety. This can result in negative lifestyle behavior and serious health problems. Cognitive behavioral therapy is an effective method to help control our thoughts in order to change and improve our emotional outlook.  Additional Videos:  Exercise    Improving  Performance  Clinical staff conducted group or individual video education with verbal and written material and guidebook.  Patient learns to use a non-linear approach by alternating intensity levels and lengths of time spent exercising to help burn more calories and lose more body fat. Cardiovascular exercise helps improve heart health, metabolism, hormonal balance, blood sugar control, and recovery from fatigue. Resistance training improves strength, endurance, balance, coordination, reaction time, metabolism, and muscle mass. Flexibility exercise improves circulation, posture, and balance. Seek guidance from your physician and exercise physiologist before implementing an exercise routine and learn your capabilities and proper form for all exercise.  Introduction to Yoga  Clinical staff conducted group or individual video education with verbal and written material and guidebook.  Patient learns about yoga, a discipline of the coming together of mind, breath, and body. The benefits of yoga include improved flexibility, improved range of motion, better posture and core strength, increased lung function, weight loss, and positive self-image. Yoga's heart health benefits include lowered blood pressure, healthier heart rate, decreased cholesterol and triglyceride levels, improved immune function, and reduced stress. Seek guidance from your physician and exercise physiologist before implementing an exercise routine and learn your capabilities and proper form for all exercise.  Medical   Aging: Enhancing Your Quality of Life  Clinical staff conducted group or individual video education with verbal and written material and guidebook.  Patient learns key strategies and recommendations to stay in good physical health and enhance quality of life, such as prevention strategies, having an advocate, securing a Health Care Proxy and Power of Attorney, and keeping a list of medications and system for tracking them. It  also discusses how to avoid risk for bone loss.  Biology of Weight Control  Clinical staff conducted group or individual video education with verbal and written material and guidebook.  Patient learns that weight gain occurs because we consume more calories than we burn (eating more, moving less). Even if your body weight is normal, you may have higher ratios of fat compared to muscle mass. Too much body fat puts you at  increased risk for cardiovascular disease, heart attack, stroke, type 2 diabetes, and obesity-related cancers. In addition to exercise, following the Pritikin Eating Plan can help reduce your risk.  Decoding Lab Results  Clinical staff conducted group or individual video education with verbal and written material and guidebook.  Patient learns that lab test reflects one measurement whose values change over time and are influenced by many factors, including medication, stress, sleep, exercise, food, hydration, pre-existing medical conditions, and more. It is recommended to use the knowledge from this video to become more involved with your lab results and evaluate your numbers to speak with your doctor.   Diseases of Our Time - Overview  Clinical staff conducted group or individual video education with verbal and written material and guidebook.  Patient learns that according to the CDC, 50% to 70% of chronic diseases (such as obesity, type 2 diabetes, elevated lipids, hypertension, and heart disease) are avoidable through lifestyle improvements including healthier food choices, listening to satiety cues, and increased physical activity.  Sleep Disorders Clinical staff conducted group or individual video education with verbal and written material and guidebook.  Patient learns how good quality and duration of sleep are important to overall health and well-being. Patient also learns about sleep disorders and how they impact health along with recommendations to address them, including  discussing with a physician.  Nutrition  Dining Out - Part 2 Clinical staff conducted group or individual video education with verbal and written material and guidebook.  Patient learns how to plan ahead and communicate in order to maximize their dining experience in a healthy and nutritious manner. Included are recommended food choices based on the type of restaurant the patient is visiting.   Fueling a Banker conducted group or individual video education with verbal and written material and guidebook.  There is a strong connection between our food choices and our health. Diseases like obesity and type 2 diabetes are very prevalent and are in large-part due to lifestyle choices. The Pritikin Eating Plan provides plenty of food and hunger-curbing satisfaction. It is easy to follow, affordable, and helps reduce health risks.  Menu Workshop  Clinical staff conducted group or individual video education with verbal and written material and guidebook.  Patient learns that restaurant meals can sabotage health goals because they are often packed with calories, fat, sodium, and sugar. Recommendations include strategies to plan ahead and to communicate with the manager, chef, or server to help order a healthier meal.  Planning Your Eating Strategy  Clinical staff conducted group or individual video education with verbal and written material and guidebook.  Patient learns about the Pritikin Eating Plan and its benefit of reducing the risk of disease. The Pritikin Eating Plan does not focus on calories. Instead, it emphasizes high-quality, nutrient-rich foods. By knowing the characteristics of the foods, we choose, we can determine their calorie density and make informed decisions.  Targeting Your Nutrition Priorities  Clinical staff conducted group or individual video education with verbal and written material and guidebook.  Patient learns that lifestyle habits have a tremendous  impact on disease risk and progression. This video provides eating and physical activity recommendations based on your personal health goals, such as reducing LDL cholesterol, losing weight, preventing or controlling type 2 diabetes, and reducing high blood pressure.  Vitamins and Minerals  Clinical staff conducted group or individual video education with verbal and written material and guidebook.  Patient learns different ways to obtain key vitamins and minerals,  including through a recommended healthy diet. It is important to discuss all supplements you take with your doctor.   Healthy Mind-Set    Smoking Cessation  Clinical staff conducted group or individual video education with verbal and written material and guidebook.  Patient learns that cigarette smoking and tobacco addiction pose a serious health risk which affects millions of people. Stopping smoking will significantly reduce the risk of heart disease, lung disease, and many forms of cancer. Recommended strategies for quitting are covered, including working with your doctor to develop a successful plan.  Culinary   Becoming a Set designer conducted group or individual video education with verbal and written material and guidebook.  Patient learns that cooking at home can be healthy, cost-effective, quick, and puts them in control. Keys to cooking healthy recipes will include looking at your recipe, assessing your equipment needs, planning ahead, making it simple, choosing cost-effective seasonal ingredients, and limiting the use of added fats, salts, and sugars.  Cooking - Breakfast and Snacks  Clinical staff conducted group or individual video education with verbal and written material and guidebook.  Patient learns how important breakfast is to satiety and nutrition through the entire day. Recommendations include key foods to eat during breakfast to help stabilize blood sugar levels and to prevent overeating at meals  later in the day. Planning ahead is also a key component.  Cooking - Educational psychologist conducted group or individual video education with verbal and written material and guidebook.  Patient learns eating strategies to improve overall health, including an approach to cook more at home. Recommendations include thinking of animal protein as a side on your plate rather than center stage and focusing instead on lower calorie dense options like vegetables, fruits, whole grains, and plant-based proteins, such as beans. Making sauces in large quantities to freeze for later and leaving the skin on your vegetables are also recommended to maximize your experience.  Cooking - Healthy Salads and Dressing Clinical staff conducted group or individual video education with verbal and written material and guidebook.  Patient learns that vegetables, fruits, whole grains, and legumes are the foundations of the Pritikin Eating Plan. Recommendations include how to incorporate each of these in flavorful and healthy salads, and how to create homemade salad dressings. Proper handling of ingredients is also covered. Cooking - Soups and [employer] - Soups and Desserts Clinical staff conducted group or individual video education with verbal and written material and guidebook.  Patient learns that Pritikin soups and desserts make for easy, nutritious, and delicious snacks and meal components that are low in sodium, fat, sugar, and calorie density, while high in vitamins, minerals, and filling fiber. Recommendations include simple and healthy ideas for soups and desserts.   Overview     The Pritikin Solution Program Overview Clinical staff conducted group or individual video education with verbal and written material and guidebook.  Patient learns that the results of the Pritikin Program have been documented in more than 100 articles published in peer-reviewed journals, and the benefits include reducing  risk factors for (and, in some cases, even reversing) high cholesterol, high blood pressure, type 2 diabetes, obesity, and more! An overview of the three key pillars of the Pritikin Program will be covered: eating well, doing regular exercise, and having a healthy mind-set.  WORKSHOPS  Exercise: Exercise Basics: Building Your Action Plan Clinical staff led group instruction and group discussion with PowerPoint presentation and patient guidebook. To enhance  the learning environment the use of posters, models and videos may be added. At the conclusion of this workshop, patients will comprehend the difference between physical activity and exercise, as well as the benefits of incorporating both, into their routine. Patients will understand the FITT (Frequency, Intensity, Time, and Type) principle and how to use it to build an exercise action plan. In addition, safety concerns and other considerations for exercise and cardiac rehab will be addressed by the presenter. The purpose of this lesson is to promote a comprehensive and effective weekly exercise routine in order to improve patients' overall level of fitness.   Managing Heart Disease: Your Path to a Healthier Heart Clinical staff led group instruction and group discussion with PowerPoint presentation and patient guidebook. To enhance the learning environment the use of posters, models and videos may be added.At the conclusion of this workshop, patients will understand the anatomy and physiology of the heart. Additionally, they will understand how Pritikin's three pillars impact the risk factors, the progression, and the management of heart disease.  The purpose of this lesson is to provide a high-level overview of the heart, heart disease, and how the Pritikin lifestyle positively impacts risk factors.  Exercise Biomechanics Clinical staff led group instruction and group discussion with PowerPoint presentation and patient guidebook. To enhance  the learning environment the use of posters, models and videos may be added. Patients will learn how the structural parts of their bodies function and how these functions impact their daily activities, movement, and exercise. Patients will learn how to promote a neutral spine, learn how to manage pain, and identify ways to improve their physical movement in order to promote healthy living. The purpose of this lesson is to expose patients to common physical limitations that impact physical activity. Participants will learn practical ways to adapt and manage aches and pains, and to minimize their effect on regular exercise. Patients will learn how to maintain good posture while sitting, walking, and lifting.  Balance Training and Fall Prevention  Clinical staff led group instruction and group discussion with PowerPoint presentation and patient guidebook. To enhance the learning environment the use of posters, models and videos may be added. At the conclusion of this workshop, patients will understand the importance of their sensorimotor skills (vision, proprioception, and the vestibular system) in maintaining their ability to balance as they age. Patients will apply a variety of balancing exercises that are appropriate for their current level of function. Patients will understand the common causes for poor balance, possible solutions to these problems, and ways to modify their physical environment in order to minimize their fall risk. The purpose of this lesson is to teach patients about the importance of maintaining balance as they age and ways to minimize their risk of falling.  WORKSHOPS   Nutrition:  Fueling a Ship broker led group instruction and group discussion with PowerPoint presentation and patient guidebook. To enhance the learning environment the use of posters, models and videos may be added. Patients will review the foundational principles of the Pritikin Eating Plan  and understand what constitutes a serving size in each of the food groups. Patients will also learn Pritikin-friendly foods that are better choices when away from home and review make-ahead meal and snack options. Calorie density will be reviewed and applied to three nutrition priorities: weight maintenance, weight loss, and weight gain. The purpose of this lesson is to reinforce (in a group setting) the key concepts around what patients are recommended to eat  and how to apply these guidelines when away from home by planning and selecting Pritikin-friendly options. Patients will understand how calorie density may be adjusted for different weight management goals.  Mindful Eating  Clinical staff led group instruction and group discussion with PowerPoint presentation and patient guidebook. To enhance the learning environment the use of posters, models and videos may be added. Patients will briefly review the concepts of the Pritikin Eating Plan and the importance of low-calorie dense foods. The concept of mindful eating will be introduced as well as the importance of paying attention to internal hunger signals. Triggers for non-hunger eating and techniques for dealing with triggers will be explored. The purpose of this lesson is to provide patients with the opportunity to review the basic principles of the Pritikin Eating Plan, discuss the value of eating mindfully and how to measure internal cues of hunger and fullness using the Hunger Scale. Patients will also discuss reasons for non-hunger eating and learn strategies to use for controlling emotional eating.  Targeting Your Nutrition Priorities Clinical staff led group instruction and group discussion with PowerPoint presentation and patient guidebook. To enhance the learning environment the use of posters, models and videos may be added. Patients will learn how to determine their genetic susceptibility to disease by reviewing their family history. Patients  will gain insight into the importance of diet as part of an overall healthy lifestyle in mitigating the impact of genetics and other environmental insults. The purpose of this lesson is to provide patients with the opportunity to assess their personal nutrition priorities by looking at their family history, their own health history and current risk factors. Patients will also be able to discuss ways of prioritizing and modifying the Pritikin Eating Plan for their highest risk areas  Menu  Clinical staff led group instruction and group discussion with PowerPoint presentation and patient guidebook. To enhance the learning environment the use of posters, models and videos may be added. Using menus brought in from [employer], or printed from [employer], patients will apply the Pritikin dining out guidelines that were presented in the [employer] video. Patients will also be able to practice these guidelines in a variety of provided scenarios. The purpose of this lesson is to provide patients with the opportunity to practice hands-on learning of the Pritikin Dining Out guidelines with actual menus and practice scenarios.  Label Reading Clinical staff led group instruction and group discussion with PowerPoint presentation and patient guidebook. To enhance the learning environment the use of posters, models and videos may be added. Patients will review and discuss the Pritikin label reading guidelines presented in Pritikin's Label Reading Educational series video. Using fool labels brought in from local grocery stores and markets, patients will apply the label reading guidelines and determine if the packaged food meet the Pritikin guidelines. The purpose of this lesson is to provide patients with the opportunity to review, discuss, and practice hands-on learning of the Pritikin Label Reading guidelines with actual packaged food labels. Cooking School  Pritikin's [employer]  are designed to teach patients ways to prepare quick, simple, and affordable recipes at home. The importance of nutrition's role in chronic disease risk reduction is reflected in its emphasis in the overall Pritikin program. By learning how to prepare essential core Pritikin Eating Plan recipes, patients will increase control over what they eat; be able to customize the flavor of foods without the use of added salt, sugar, or fat; and improve the quality of  the food they consume. By learning a set of core recipes which are easily assembled, quickly prepared, and affordable, patients are more likely to prepare more healthy foods at home. These workshops focus on convenient breakfasts, simple entres, side dishes, and desserts which can be prepared with minimal effort and are consistent with nutrition recommendations for cardiovascular risk reduction. Cooking [employer] are taught by a Armed forces logistics/support/administrative officer (RD) who has been trained by the [employer]. The chef or RD has a clear understanding of the importance of minimizing - if not completely eliminating - added fat, sugar, and sodium in recipes. Throughout the series of Cooking School Workshop sessions, patients will learn about healthy ingredients and efficient methods of cooking to build confidence in their capability to prepare    Cooking School weekly topics:  Adding Flavor- Sodium-Free  Fast and Healthy Breakfasts  Powerhouse Plant-Based Proteins  Satisfying Salads and Dressings  Simple Sides and Sauces  International Cuisine-Spotlight on the [employer] Zones  Delicious Desserts  Savory Soups  [employer] - Meals in a Astronomer Appetizers and Snacks  Comforting Weekend Breakfasts  One-Pot Wonders   Fast Evening Meals  Landscape architect Your Pritikin Plate  WORKSHOPS   Healthy Mindset (Psychosocial):  Focused Goals, Sustainable Changes Clinical staff led group instruction and group discussion  with PowerPoint presentation and patient guidebook. To enhance the learning environment the use of posters, models and videos may be added. Patients will be able to apply effective goal setting strategies to establish at least one personal goal, and then take consistent, meaningful action toward that goal. They will learn to identify common barriers to achieving personal goals and develop strategies to overcome them. Patients will also gain an understanding of how our mind-set can impact our ability to achieve goals and the importance of cultivating a positive and growth-oriented mind-set. The purpose of this lesson is to provide patients with a deeper understanding of how to set and achieve personal goals, as well as the tools and strategies needed to overcome common obstacles which may arise along the way.  From Head to Heart: The Power of a Healthy Outlook  Clinical staff led group instruction and group discussion with PowerPoint presentation and patient guidebook. To enhance the learning environment the use of posters, models and videos may be added. Patients will be able to recognize and describe the impact of emotions and mood on physical health. They will discover the importance of self-care and explore self-care practices which may work for them. Patients will also learn how to utilize the 4 C's to cultivate a healthier outlook and better manage stress and challenges. The purpose of this lesson is to demonstrate to patients how a healthy outlook is an essential part of maintaining good health, especially as they continue their cardiac rehab journey.  Healthy Sleep for a Healthy Heart Clinical staff led group instruction and group discussion with PowerPoint presentation and patient guidebook. To enhance the learning environment the use of posters, models and videos may be added. At the conclusion of this workshop, patients will be able to demonstrate knowledge of the importance of sleep to overall  health, well-being, and quality of life. They will understand the symptoms of, and treatments for, common sleep disorders. Patients will also be able to identify daytime and nighttime behaviors which impact sleep, and they will be able to apply these tools to help manage sleep-related challenges. The purpose of this lesson is to provide patients with a  general overview of sleep and outline the importance of quality sleep. Patients will learn about a few of the most common sleep disorders. Patients will also be introduced to the concept of "sleep hygiene," and discover ways to self-manage certain sleeping problems through simple daily behavior changes. Finally, the workshop will motivate patients by clarifying the links between quality sleep and their goals of heart-healthy living.   Recognizing and Reducing Stress Clinical staff led group instruction and group discussion with PowerPoint presentation and patient guidebook. To enhance the learning environment the use of posters, models and videos may be added. At the conclusion of this workshop, patients will be able to understand the types of stress reactions, differentiate between acute and chronic stress, and recognize the impact that chronic stress has on their health. They will also be able to apply different coping mechanisms, such as reframing negative self-talk. Patients will have the opportunity to practice a variety of stress management techniques, such as deep abdominal breathing, progressive muscle relaxation, and/or guided imagery.  The purpose of this lesson is to educate patients on the role of stress in their lives and to provide healthy techniques for coping with it.  Learning Barriers/Preferences:  Learning Barriers/Preferences - 07/27/23 1126       Learning Barriers/Preferences   Learning Barriers None    Learning Preferences Audio;Computer/Internet;Group Instruction;Individual Instruction;Pictoral;Skilled Demonstration;Verbal  Instruction;Video;Written Material          Education Topics:  Knowledge Questionnaire Score:  Knowledge Questionnaire Score - 07/27/23 1136       Knowledge Questionnaire Score   Pre Score 15/28          Core Components/Risk Factors/Patient Goals at Admission:  Personal Goals and Risk Factors at Admission - 07/27/23 1126       Core Components/Risk Factors/Patient Goals on Admission    Weight Management Yes;Weight Maintenance    Intervention Weight Management: Develop a combined nutrition and exercise program designed to reach desired caloric intake, while maintaining appropriate intake of nutrient and fiber, sodium and fats, and appropriate energy expenditure required for the weight goal.;Weight Management: Provide education and appropriate resources to help participant work on and attain dietary goals.    Expected Outcomes Short Term: Continue to assess and modify interventions until short term weight is achieved;Long Term: Adherence to nutrition and physical activity/exercise program aimed toward attainment of established weight goal;Understanding recommendations for meals to include 15-35% energy as protein, 25-35% energy from fat, 35-60% energy from carbohydrates, less than 200mg  of dietary cholesterol, 20-35 gm of total fiber daily;Understanding of distribution of calorie intake throughout the day with the consumption of 4-5 meals/snacks    Heart Failure Yes    Intervention Provide a combined exercise and nutrition program that is supplemented with education, support and counseling about heart failure. Directed toward relieving symptoms such as shortness of breath, decreased exercise tolerance, and extremity edema.    Expected Outcomes Improve functional capacity of life;Short term: Attendance in program 2-3 days a week with increased exercise capacity. Reported lower sodium intake. Reported increased fruit and vegetable intake. Reports medication compliance.;Short term: Daily  weights obtained and reported for increase. Utilizing diuretic protocols set by physician.;Long term: Adoption of self-care skills and reduction of barriers for early signs and symptoms recognition and intervention leading to self-care maintenance.    Hypertension Yes    Intervention Provide education on lifestyle modifcations including regular physical activity/exercise, weight management, moderate sodium restriction and increased consumption of fresh fruit, vegetables, and low fat dairy, alcohol moderation, and smoking cessation.;Monitor  prescription use compliance.    Expected Outcomes Long Term: Maintenance of blood pressure at goal levels.;Short Term: Continued assessment and intervention until BP is < 140/1mm HG in hypertensive participants. < 130/6mm HG in hypertensive participants with diabetes, heart failure or chronic kidney disease.    Lipids Yes    Intervention Provide education and support for participant on nutrition & aerobic/resistive exercise along with prescribed medications to achieve LDL 70mg , HDL >40mg .    Expected Outcomes Short Term: Participant states understanding of desired cholesterol values and is compliant with medications prescribed. Participant is following exercise prescription and nutrition guidelines.;Long Term: Cholesterol controlled with medications as prescribed, with individualized exercise RX and with personalized nutrition plan. Value goals: LDL < 70mg , HDL > 40 mg.          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 Introduction to pritikin education/intensive cardiac rehab. Initial orientation packet reviewed with patient.          Comments: Participant attended orientation for the cardiac rehabilitation program on  07/27/2023  to perform initial  intake and exercise walk test. Patient introduced to the [facility] education and orientation packet was reviewed. Completed 6-minute walk test, measurements, initial ITP, and exercise prescription. Vital signs stable. Telemetry-normal sinus rhythm, asymptomatic.   Service time was from 1032 to 1218.  Con KATHEE Pereyra, MS, ACSM-CEP 07/27/2023 2:19 PM

## 2023-07-31 ENCOUNTER — Encounter (HOSPITAL_COMMUNITY): Payer: Self-pay | Admitting: Cardiology

## 2023-07-31 ENCOUNTER — Encounter (HOSPITAL_COMMUNITY)
Admission: RE | Admit: 2023-07-31 | Discharge: 2023-07-31 | Disposition: A | Discharge status: Home / Self Care | Source: Ambulatory Visit | Attending: Cardiology | Admitting: Cardiology

## 2023-07-31 ENCOUNTER — Ambulatory Visit (HOSPITAL_COMMUNITY): Payer: Self-pay | Admitting: Cardiology

## 2023-07-31 ENCOUNTER — Ambulatory Visit (HOSPITAL_COMMUNITY)
Admission: RE | Admit: 2023-07-31 | Discharge: 2023-07-31 | Disposition: A | Discharge status: Home / Self Care | Source: Ambulatory Visit | Attending: Cardiology | Admitting: Cardiology

## 2023-07-31 DIAGNOSIS — I5022 Chronic systolic (congestive) heart failure: Secondary | ICD-10-CM | POA: Insufficient documentation

## 2023-07-31 DIAGNOSIS — Z952 Presence of prosthetic heart valve: Secondary | ICD-10-CM

## 2023-07-31 LAB — BASIC METABOLIC PANEL WITH GFR
Anion gap: 10 (ref 5–15)
BUN: 9 mg/dL (ref 6–20)
CO2: 24 mmol/L (ref 22–32)
Calcium: 9.2 mg/dL (ref 8.9–10.3)
Chloride: 105 mmol/L (ref 98–111)
Creatinine, Ser: 0.86 mg/dL (ref 0.44–1.00)
GFR, Estimated: >60 mL/min (ref >60)
Glucose, Bld: 94 mg/dL (ref 70–99)
Potassium: 4.3 mmol/L (ref 3.5–5.1)
Sodium: 139 mmol/L (ref 135–145)

## 2023-08-01 NOTE — Progress Notes (Signed)
 Cardiac Individual Treatment Plan  Patient Details  Name: Leslie Walker MRN: [id number] Date of Birth: 11-Feb-1965 Referring Provider:   Flowsheet Row CARDIAC REHAB PHASE II ORIENTATION from 07/27/2023 in [facility]  Referring Provider Ezra Shuck, MD    Initial Encounter Date:  Flowsheet Row CARDIAC REHAB PHASE II ORIENTATION from 07/27/2023 in [facility]  Date 07/27/23    Visit Diagnosis: 04/28/23 S/P aortic valve replacement  Patient's Home Medications on Admission:  Current Outpatient Medications:    aspirin  EC 81 MG tablet, Take 1 tablet (81 mg total) by mouth daily. Swallow whole., Disp: , Rfl:    atorvastatin  (LIPITOR) 40 MG tablet, Take 1 tablet (40 mg total) by mouth daily at 6 PM., Disp: 90 tablet, Rfl: 3   carvedilol  (COREG ) 12.5 MG tablet, Take 1 tablet (12.5 mg total) by mouth 2 (two) times daily with a meal., Disp: 60 tablet, Rfl: 1   clopidogrel  (PLAVIX ) 75 MG tablet, Take 1 tablet (75 mg total) by mouth daily., Disp: 90 tablet, Rfl: 3   empagliflozin  (JARDIANCE ) 10 MG TABS tablet, Take 1 tablet (10 mg total) by mouth daily., Disp: 30 tablet, Rfl: 3   Evolocumab  (REPATHA  SURECLICK) 140 MG/ML SOAJ, Inject 140 mg into the skin every 14 (fourteen) days., Disp: 6 mL, Rfl: 3   ezetimibe  (ZETIA ) 10 MG tablet, Take 1 tablet (10 mg total) by mouth daily., Disp: 90 tablet, Rfl: 3   furosemide  (LASIX ) 20 MG tablet, Take 1 tablet (20 mg total) by mouth as needed for fluid or edema. For weight gain of 3lb in 24 hours or 5lb in a week., Disp: 8 tablet, Rfl: 4   hydrOXYzine  (ATARAX ) 25 MG tablet, Take 1 tablet (25 mg total) by mouth at bedtime as needed., Disp: 30 tablet, Rfl: 1   losartan  (COZAAR ) 25 MG tablet, Take 1 tablet (25 mg total) by mouth daily., Disp: 90 tablet, Rfl: 3   spironolactone  (ALDACTONE ) 25 MG tablet, Take 0.5 tablets (12.5 mg total) by mouth  daily., Disp: 30 tablet, Rfl: 1  Past Medical History: Past Medical History:  Diagnosis Date   Chronic systolic heart failure (HCC)    Cocaine abuse (HCC)    last use about 1 year ago (04/26/23)   Coronary artery disease    Dyslipidemia    Hypertension    Noncompliance w/medication treatment due to intermit use of medication    Stroke (HCC) 2021   no deficits    Tobacco Use: Social History   Tobacco Use  Smoking Status Some Days   Current packs/day: 1.00   Types: Cigars, Cigarettes  Smokeless Tobacco Never  Tobacco Comments   A cigarette or black n mild once in a while    Labs: Review Flowsheet  More data exists      Latest Ref Rng & Units 04/04/2022 12/07/2022 04/05/2023 04/26/2023 04/28/2023  Labs for ITP Cardiac and Pulmonary Rehab  Cholestrol 0 - 200 mg/dL 884  863  - - -  LDL (calc) 0 - 99 mg/dL 55  74  - - -  HDL-C >59 mg/dL 44  51  - - -  Trlycerides <150 mg/dL 78  56  - - -  Hemoglobin A1c 4.8 - 5.6 % - - - 5.5  -  PH, Arterial 7.35 - 7.45 - - - - 7.371  7.376  7.418  7.403  7.426  7.357   PCO2 arterial 32 -  48 mmHg - - - - 40.4  39.0  40.0  40.6  35.4  42.6   Bicarbonate 20.0 - 28.0 mmol/L - - 27.3  25.9  - 23.2  22.8  23.0  25.8  25.3  30.0  23.2  23.9   TCO2 22 - 32 mmol/L - - 29  27  - 24  24  24  27  27  27  27  31  24  26  25  28    Acid-base deficit 0.0 - 2.0 mmol/L - - - - 2.0  2.0  2.0  1.0  2.0   O2 Saturation % - - 74  74  - 100  100  100  100  100  84  100  100     Details       Multiple values from one day are sorted in reverse-chronological order         Capillary Blood Glucose: Lab Results  Component Value Date   GLUCAP 95 05/01/2023   GLUCAP 98 05/01/2023   GLUCAP 131 (H) 04/30/2023   GLUCAP 102 (H) 04/30/2023   GLUCAP 139 (H) 04/30/2023     Exercise Target Goals: Exercise Program Goal: Individual exercise prescription set using results from initial 6 min walk test and THRR while considering  patient's activity barriers and safety.    Exercise Prescription Goal: Initial exercise prescription builds to 30-45 minutes a day of aerobic activity, 2-3 days per week.  Home exercise guidelines will be given to patient during program as part of exercise prescription that the participant will acknowledge.  Activity Barriers & Risk Stratification:  Activity Barriers & Cardiac Risk Stratification - 07/27/23 1124       Activity Barriers & Cardiac Risk Stratification   Activity Barriers Decreased Ventricular Function;Joint Problems    Cardiac Risk Stratification High   <5 METs on         6 Minute Walk:  6 Minute Walk     Row Name 07/27/23 1412         6 Minute Walk   Phase Initial     Distance 1495 feet     Walk Time 6 minutes     # of Rest Breaks 0     MPH 2.83     METS 4.04     RPE 9     Perceived Dyspnea  0     VO2 Peak 14.14     Symptoms No     Resting HR 59 bpm     Resting BP 132/80     Resting Oxygen Saturation  100 %     Exercise Oxygen Saturation  during 6 min walk 100 %     Max Ex. HR 86 bpm     Max Ex. BP 168/90     2 Minute Post BP 154/84        Oxygen Initial Assessment:   Oxygen Re-Evaluation:   Oxygen Discharge (Final Oxygen Re-Evaluation):   Initial Exercise Prescription:  Initial Exercise Prescription - 07/27/23 1100       Date of Initial Exercise RX and Referring Provider   Date 07/27/23    Referring Provider Ezra Shuck, MD    Expected Discharge Date 09/06/23      Recumbant Elliptical   Level 1    RPM 55    Watts 70    Minutes 15    METs 2.5      Track   Laps 17    Minutes 15  METs 2.3      Prescription Details   Frequency (times per week) 3    Duration Progress to 30 minutes of continuous aerobic without signs/symptoms of physical distress      Intensity   THRR 40-80% of Max Heartrate 65-130    Ratings of Perceived Exertion 11-13    Perceived Dyspnea 0-4      Progression   Progression Continue progressive overload as per policy without  signs/symptoms or physical distress.      Resistance Training   Training Prescription Yes    Weight 2    Reps 10-15          Perform Capillary Blood Glucose checks as needed.  Exercise Prescription Changes:   Exercise Prescription Changes     Row Name 07/31/23 1600             Response to Exercise   Blood Pressure (Admit) 112/78       Blood Pressure (Exercise) 128/78       Blood Pressure (Exit) 106/80       Heart Rate (Admit) 88 bpm       Heart Rate (Exercise) 91 bpm       Heart Rate (Exit) 74 bpm       Rating of Perceived Exertion (Exercise) 11       Perceived Dyspnea (Exercise) 0       Symptoms None       Comments Pt first day in CRP2       Duration Continue with 30 min of aerobic exercise without signs/symptoms of physical distress.       Intensity THRR unchanged         Progression   Progression Continue to progress workloads to maintain intensity without signs/symptoms of physical distress.       Average METs 2.51         Resistance Training   Training Prescription Yes       Weight 2       Reps 10-15         Interval Training   Interval Training No         Recumbant Elliptical   Level 1       RPM 38       Watts 38       Minutes 15       METs 2.1         Track   Laps 15       Minutes 15       METs 2.91          Exercise Comments:   Exercise Comments     Row Name 07/31/23 1640           Exercise Comments Pt first day in CRP2 program. Pt tolerated exercise well with an avg MET level of 2.5 with no s/sx or pain. Will continue to progress workloads as tolerated.          Exercise Goals and Review:   Exercise Goals     Row Name 07/27/23 1026             Exercise Goals   Increase Physical Activity Yes       Intervention Provide advice, education, support and counseling about physical activity/exercise needs.;Develop an individualized exercise prescription for aerobic and resistive training based on initial evaluation findings, risk  stratification, comorbidities and participant's personal goals.       Expected Outcomes Short Term: Attend rehab on a regular basis to increase  amount of physical activity.;Long Term: Exercising regularly at least 3-5 days a week.;Long Term: Add in home exercise to make exercise part of routine and to increase amount of physical activity.       Increase Strength and Stamina Yes       Intervention Provide advice, education, support and counseling about physical activity/exercise needs.;Develop an individualized exercise prescription for aerobic and resistive training based on initial evaluation findings, risk stratification, comorbidities and participant's personal goals.       Expected Outcomes Short Term: Increase workloads from initial exercise prescription for resistance, speed, and METs.;Short Term: Perform resistance training exercises routinely during rehab and add in resistance training at home;Long Term: Improve cardiorespiratory fitness, muscular endurance and strength as measured by increased METs and functional capacity ( )       Able to understand and use rate of perceived exertion (RPE) scale Yes       Intervention Provide education and explanation on how to use RPE scale       Expected Outcomes Short Term: Able to use RPE daily in rehab to express subjective intensity level;Long Term:  Able to use RPE to guide intensity level when exercising independently       Knowledge and understanding of Target Heart Rate Range (THRR) Yes       Intervention Provide education and explanation of THRR including how the numbers were predicted and where they are located for reference       Expected Outcomes Short Term: Able to state/look up THRR;Long Term: Able to use THRR to govern intensity when exercising independently;Short Term: Able to use daily as guideline for intensity in rehab       Understanding of Exercise Prescription Yes       Intervention Provide education, explanation, and written  materials on patient's individual exercise prescription       Expected Outcomes Short Term: Able to explain program exercise prescription;Long Term: Able to explain home exercise prescription to exercise independently          Exercise Goals Re-Evaluation :  Exercise Goals Re-Evaluation     Row Name 07/31/23 1639             Exercise Goal Re-Evaluation   Exercise Goals Review Increase Physical Activity;Understanding of Exercise Prescription;Increase Strength and Stamina;Knowledge and understanding of Target Heart Rate Range (THRR);Able to understand and use rate of perceived exertion (RPE) scale       Comments Pt first day in CRP2 program. Pt is learning her THRR, ExRx, and RPE scale. Maylea tolerated exercise well with no s/sx or pain and an avg MET level of 2.5.       Expected Outcomes Will continue to progress workloads as tolerated without s/sx.          Discharge Exercise Prescription (Final Exercise Prescription Changes):  Exercise Prescription Changes - 07/31/23 1600       Response to Exercise   Blood Pressure (Admit) 112/78    Blood Pressure (Exercise) 128/78    Blood Pressure (Exit) 106/80    Heart Rate (Admit) 88 bpm    Heart Rate (Exercise) 91 bpm    Heart Rate (Exit) 74 bpm    Rating of Perceived Exertion (Exercise) 11    Perceived Dyspnea (Exercise) 0    Symptoms None    Comments Pt first day in CRP2    Duration Continue with 30 min of aerobic exercise without signs/symptoms of physical distress.    Intensity THRR unchanged      Progression  Progression Continue to progress workloads to maintain intensity without signs/symptoms of physical distress.    Average METs 2.51      Resistance Training   Training Prescription Yes    Weight 2    Reps 10-15      Interval Training   Interval Training No      Recumbant Elliptical   Level 1    RPM 38    Watts 38    Minutes 15    METs 2.1      Track   Laps 15    Minutes 15    METs 2.91           Nutrition:  Target Goals: Understanding of nutrition guidelines, daily intake of sodium 1500mg , cholesterol 200mg , calories 30% from fat and 7% or less from saturated fats, daily to have 5 or more servings of fruits and vegetables.  Biometrics:  Pre Biometrics - 07/27/23 1025       Pre Biometrics   Waist Circumference 36 inches    Hip Circumference 41 inches    Waist to Hip Ratio 0.88 %    Triceps Skinfold 15 mm    % Body Fat 34.3 %    Grip Strength 26 kg    Flexibility 14.75 in    Single Leg Stand 4.6 seconds           Nutrition Therapy Plan and Nutrition Goals:  Nutrition Therapy & Goals - 07/31/23 1527       Nutrition Therapy   Diet Heart Healthy Diet    Drug/Food Interactions Statins/Certain Fruits      Personal Nutrition Goals   Nutrition Goal Patient to identify strategies for reducing cardiovascular risk by attending the Pritikin education and nutrition series weekly.    Personal Goal #2 Patient to improve diet quality by using the plate method as a guide for meal planning to include lean protein/plant protein, fruits, vegetables, whole grains, nonfat dairy as part of a well-balanced diet.    Comments Patient has medical history of HTN, hyperlipidemia, stroke cypotogenic 2020/LINQ placement, CAD, polysubstance abuse heroin/cocaine abuse, and chronic HFrEF, NSTEMI. LDL is not quite at goal (74); she continues zetia , repatha , lipitor. Patient will benefit from participation in intensive cardiac rehab for nutrition education, exercise, and lifestyle modification.      Intervention Plan   Intervention Prescribe, educate and counsel regarding individualized specific dietary modifications aiming towards targeted core components such as weight, hypertension, lipid management, diabetes, heart failure and other comorbidities.;Nutrition handout(s) given to patient.    Expected Outcomes Short Term Goal: Understand basic principles of dietary content, such as calories, fat,  sodium, cholesterol and nutrients.;Long Term Goal: Adherence to prescribed nutrition plan.          Nutrition Assessments:  MEDIFICTS Score Key: >=70 Need to make dietary changes  40-70 Heart Healthy Diet <= 40 Therapeutic Level Cholesterol Diet    Picture Your Plate Scores: <59 Unhealthy dietary pattern with much room for improvement. 41-50 Dietary pattern unlikely to meet recommendations for good health and room for improvement. 51-60 More healthful dietary pattern, with some room for improvement.  >60 Healthy dietary pattern, although there may be some specific behaviors that could be improved.    Nutrition Goals Re-Evaluation:  Nutrition Goals Re-Evaluation     Row Name 07/31/23 1527             Goals   Current Weight 149 lb 0.5 oz (67.6 kg)       Comment LDL 74, A1c WNL  Expected Outcome Patient has medical history of HTN, hyperlipidemia, stroke cypotogenic 2020/LINQ placement, CAD, polysubstance abuse heroin/cocaine abuse, and chronic HFrEF, NSTEMI. LDL is not quite at goal (74); she continues zetia , repatha , lipitor. Patient will benefit from participation in intensive cardiac rehab for nutrition education, exercise, and lifestyle modification.          Nutrition Goals Re-Evaluation:  Nutrition Goals Re-Evaluation     Row Name 07/31/23 1527             Goals   Current Weight 149 lb 0.5 oz (67.6 kg)       Comment LDL 74, A1c WNL       Expected Outcome Patient has medical history of HTN, hyperlipidemia, stroke cypotogenic 2020/LINQ placement, CAD, polysubstance abuse heroin/cocaine abuse, and chronic HFrEF, NSTEMI. LDL is not quite at goal (74); she continues zetia , repatha , lipitor. Patient will benefit from participation in intensive cardiac rehab for nutrition education, exercise, and lifestyle modification.          Nutrition Goals Discharge (Final Nutrition Goals Re-Evaluation):  Nutrition Goals Re-Evaluation - 07/31/23 1527       Goals    Current Weight 149 lb 0.5 oz (67.6 kg)    Comment LDL 74, A1c WNL    Expected Outcome Patient has medical history of HTN, hyperlipidemia, stroke cypotogenic 2020/LINQ placement, CAD, polysubstance abuse heroin/cocaine abuse, and chronic HFrEF, NSTEMI. LDL is not quite at goal (74); she continues zetia , repatha , lipitor. Patient will benefit from participation in intensive cardiac rehab for nutrition education, exercise, and lifestyle modification.          Psychosocial: Target Goals: Acknowledge presence or absence of significant depression and/or stress, maximize coping skills, provide positive support system. Participant is able to verbalize types and ability to use techniques and skills needed for reducing stress and depression.  Initial Review & Psychosocial Screening:  Initial Psych Review & Screening - 07/27/23 1126       Initial Review   Current issues with None Identified      Family Dynamics   Good Support System? Yes   daughter     Barriers   Psychosocial barriers to participate in program There are no identifiable barriers or psychosocial needs.      Screening Interventions   Interventions Encouraged to exercise;Provide feedback about the scores to participant    Expected Outcomes Long Term goal: The participant improves quality of Life and PHQ9 Scores as seen by post scores and/or verbalization of changes;Short Term goal: Identification and review with participant of any Quality of Life or Depression concerns found by scoring the questionnaire.          Quality of Life Scores:  Quality of Life - 07/27/23 1148       Quality of Life   Select Quality of Life      Quality of Life Scores   Health/Function Pre 25.17 %    Socioeconomic Pre 30 %    Psych/Spiritual Pre 30 %    Family Pre 30 %    GLOBAL Pre 27.93 %         Scores of 19 and below usually indicate a poorer quality of life in these areas.  A difference of  2-3 points is a clinically meaningful  difference.  A difference of 2-3 points in the total score of the Quality of Life Index has been associated with significant improvement in overall quality of life, self-image, physical symptoms, and general health in studies assessing change in quality of life.  PHQ-9: Review Flowsheet       07/27/2023 05/09/2023 12/23/2021  Depression screen PHQ 2/9  Decreased Interest 0 0 1  Down, Depressed, Hopeless 0 0 0  PHQ - 2 Score 0 0 1  Altered sleeping 0 0 0  Tired, decreased energy 1 0 1  Change in appetite 0 2 0  Feeling bad or failure about yourself  0 0 0  Trouble concentrating 0 0 0  Moving slowly or fidgety/restless 0 1 0  Suicidal thoughts 0 0 0  PHQ-9 Score 1 3 2   Difficult doing work/chores Not difficult at all - Not difficult at all   Interpretation of Total Score  Total Score Depression Severity:  1-4 = Minimal depression, 5-9 = Mild depression, 10-14 = Moderate depression, 15-19 = Moderately severe depression, 20-27 = Severe depression   Psychosocial Evaluation and Intervention:   Psychosocial Re-Evaluation:  Psychosocial Re-Evaluation     Row Name 08/01/23 1622             Psychosocial Re-Evaluation   Current issues with None Identified       Interventions Encouraged to attend Cardiac Rehabilitation for the exercise       Continue Psychosocial Services  No Follow up required          Psychosocial Discharge (Final Psychosocial Re-Evaluation):  Psychosocial Re-Evaluation - 08/01/23 1622       Psychosocial Re-Evaluation   Current issues with None Identified    Interventions Encouraged to attend Cardiac Rehabilitation for the exercise    Continue Psychosocial Services  No Follow up required          Vocational Rehabilitation: Provide vocational rehab assistance to qualifying candidates.   Vocational Rehab Evaluation & Intervention:  Vocational Rehab - 07/27/23 1126       Initial Vocational Rehab Evaluation & Intervention   Assessment shows need for  Vocational Rehabilitation No   works at [employer]: Education Goals: Education classes will be provided on a weekly basis, covering required topics. Participant will state understanding/return demonstration of topics presented.    Education     Row Name 07/31/23 1400     Education   Cardiac Education Topics Pritikin   Hospital doctor Education   General Education Hypertension and Heart Disease   Instruction Review Code 1- Verbalizes Understanding   Class Start Time 1403   Class Stop Time 1445   Class Time Calculation (min) 42 min      Core Videos: Exercise    Move It!  Clinical staff conducted group or individual video education with verbal and written material and guidebook.  Patient learns the recommended Pritikin exercise program. Exercise with the goal of living a long, healthy life. Some of the health benefits of exercise include controlled diabetes, healthier blood pressure levels, improved cholesterol levels, improved heart and lung capacity, improved sleep, and better body composition. Everyone should speak with their doctor before starting or changing an exercise routine.  Biomechanical Limitations Clinical staff conducted group or individual video education with verbal and written material and guidebook.  Patient learns how biomechanical limitations can impact exercise and how we can mitigate and possibly overcome limitations to have an impactful and balanced exercise routine.  Body Composition Clinical staff conducted group or individual video education with verbal and written material and guidebook.  Patient learns that body composition (ratio of muscle mass to  fat mass) is a key component to assessing overall fitness, rather than body weight alone. Increased fat mass, especially visceral belly fat, can put us  at increased risk for metabolic syndrome, type 2 diabetes, heart  disease, and even death. It is recommended to combine diet and exercise (cardiovascular and resistance training) to improve your body composition. Seek guidance from your physician and exercise physiologist before implementing an exercise routine.  Exercise Action Plan Clinical staff conducted group or individual video education with verbal and written material and guidebook.  Patient learns the recommended strategies to achieve and enjoy long-term exercise adherence, including variety, self-motivation, self-efficacy, and positive decision making. Benefits of exercise include fitness, good health, weight management, more energy, better sleep, less stress, and overall well-being.  Medical   Heart Disease Risk Reduction Clinical staff conducted group or individual video education with verbal and written material and guidebook.  Patient learns our heart is our most vital organ as it circulates oxygen, nutrients, white blood cells, and hormones throughout the entire body, and carries waste away. Data supports a plant-based eating plan like the Pritikin Program for its effectiveness in slowing progression of and reversing heart disease. The video provides a number of recommendations to address heart disease.   Metabolic Syndrome and Belly Fat  Clinical staff conducted group or individual video education with verbal and written material and guidebook.  Patient learns what metabolic syndrome is, how it leads to heart disease, and how one can reverse it and keep it from coming back. You have metabolic syndrome if you have 3 of the following 5 criteria: abdominal obesity, high blood pressure, high triglycerides, low HDL cholesterol, and high blood sugar.  Hypertension and Heart Disease Clinical staff conducted group or individual video education with verbal and written material and guidebook.  Patient learns that high blood pressure, or hypertension, is very common in the [place] . Hypertension is  largely due to excessive salt intake, but other important risk factors include being overweight, physical inactivity, drinking too much alcohol, smoking, and not eating enough potassium from fruits and vegetables. High blood pressure is a leading risk factor for heart attack, stroke, congestive heart failure, dementia, kidney failure, and premature death. Long-term effects of excessive salt intake include stiffening of the arteries and thickening of heart muscle and organ damage. Recommendations include ways to reduce hypertension and the risk of heart disease.  Diseases of Our Time - Focusing on Diabetes Clinical staff conducted group or individual video education with verbal and written material and guidebook.  Patient learns why the best way to stop diseases of our time is prevention, through food and other lifestyle changes. Medicine (such as prescription pills and surgeries) is often only a Band-Aid on the problem, not a long-term solution. Most common diseases of our time include obesity, type 2 diabetes, hypertension, heart disease, and cancer. The Pritikin Program is recommended and has been proven to help reduce, reverse, and/or prevent the damaging effects of metabolic syndrome.  Nutrition   Overview of the Pritikin Eating Plan  Clinical staff conducted group or individual video education with verbal and written material and guidebook.  Patient learns about the Pritikin Eating Plan for disease risk reduction. The Pritikin Eating Plan emphasizes a wide variety of unrefined, minimally-processed carbohydrates, like fruits, vegetables, whole grains, and legumes. Go, Caution, and Stop food choices are explained. Plant-based and lean animal proteins are emphasized. Rationale provided for low sodium intake for blood pressure control, low added sugars for blood sugar stabilization, and low added  fats and oils for coronary artery disease risk reduction and weight management.  Calorie Density  Clinical  staff conducted group or individual video education with verbal and written material and guidebook.  Patient learns about calorie density and how it impacts the Pritikin Eating Plan. Knowing the characteristics of the food you choose will help you decide whether those foods will lead to weight gain or weight loss, and whether you want to consume more or less of them. Weight loss is usually a side effect of the Pritikin Eating Plan because of its focus on low calorie-dense foods.  Label Reading  Clinical staff conducted group or individual video education with verbal and written material and guidebook.  Patient learns about the Pritikin recommended label reading guidelines and corresponding recommendations regarding calorie density, added sugars, sodium content, and whole grains.  Dining Out - Part 1  Clinical staff conducted group or individual video education with verbal and written material and guidebook.  Patient learns that restaurant meals can be sabotaging because they can be so high in calories, fat, sodium, and/or sugar. Patient learns recommended strategies on how to positively address this and avoid unhealthy pitfalls.  Facts on Fats  Clinical staff conducted group or individual video education with verbal and written material and guidebook.  Patient learns that lifestyle modifications can be just as effective, if not more so, as many medications for lowering your risk of heart disease. A Pritikin lifestyle can help to reduce your risk of inflammation and atherosclerosis (cholesterol build-up, or plaque, in the artery walls). Lifestyle interventions such as dietary choices and physical activity address the cause of atherosclerosis. A review of the types of fats and their impact on blood cholesterol levels, along with dietary recommendations to reduce fat intake is also included.  Nutrition Action Plan  Clinical staff conducted group or individual video education with verbal and written  material and guidebook.  Patient learns how to incorporate Pritikin recommendations into their lifestyle. Recommendations include planning and keeping personal health goals in mind as an important part of their success.  Healthy Mind-Set    Healthy Minds, Bodies, Hearts  Clinical staff conducted group or individual video education with verbal and written material and guidebook.  Patient learns how to identify when they are stressed. Video will discuss the impact of that stress, as well as the many benefits of stress management. Patient will also be introduced to stress management techniques. The way we think, act, and feel has an impact on our hearts.  How Our Thoughts Can Heal Our Hearts  Clinical staff conducted group or individual video education with verbal and written material and guidebook.  Patient learns that negative thoughts can cause depression and anxiety. This can result in negative lifestyle behavior and serious health problems. Cognitive behavioral therapy is an effective method to help control our thoughts in order to change and improve our emotional outlook.  Additional Videos:  Exercise    Improving Performance  Clinical staff conducted group or individual video education with verbal and written material and guidebook.  Patient learns to use a non-linear approach by alternating intensity levels and lengths of time spent exercising to help burn more calories and lose more body fat. Cardiovascular exercise helps improve heart health, metabolism, hormonal balance, blood sugar control, and recovery from fatigue. Resistance training improves strength, endurance, balance, coordination, reaction time, metabolism, and muscle mass. Flexibility exercise improves circulation, posture, and balance. Seek guidance from your physician and exercise physiologist before implementing an exercise routine and learn  your capabilities and proper form for all exercise.  Introduction to Yoga  Clinical  staff conducted group or individual video education with verbal and written material and guidebook.  Patient learns about yoga, a discipline of the coming together of mind, breath, and body. The benefits of yoga include improved flexibility, improved range of motion, better posture and core strength, increased lung function, weight loss, and positive self-image. Yoga's heart health benefits include lowered blood pressure, healthier heart rate, decreased cholesterol and triglyceride levels, improved immune function, and reduced stress. Seek guidance from your physician and exercise physiologist before implementing an exercise routine and learn your capabilities and proper form for all exercise.  Medical   Aging: Enhancing Your Quality of Life  Clinical staff conducted group or individual video education with verbal and written material and guidebook.  Patient learns key strategies and recommendations to stay in good physical health and enhance quality of life, such as prevention strategies, having an advocate, securing a Health Care Proxy and Power of Attorney, and keeping a list of medications and system for tracking them. It also discusses how to avoid risk for bone loss.  Biology of Weight Control  Clinical staff conducted group or individual video education with verbal and written material and guidebook.  Patient learns that weight gain occurs because we consume more calories than we burn (eating more, moving less). Even if your body weight is normal, you may have higher ratios of fat compared to muscle mass. Too much body fat puts you at increased risk for cardiovascular disease, heart attack, stroke, type 2 diabetes, and obesity-related cancers. In addition to exercise, following the Pritikin Eating Plan can help reduce your risk.  Decoding Lab Results  Clinical staff conducted group or individual video education with verbal and written material and guidebook.  Patient learns that lab test  reflects one measurement whose values change over time and are influenced by many factors, including medication, stress, sleep, exercise, food, hydration, pre-existing medical conditions, and more. It is recommended to use the knowledge from this video to become more involved with your lab results and evaluate your numbers to speak with your doctor.   Diseases of Our Time - Overview  Clinical staff conducted group or individual video education with verbal and written material and guidebook.  Patient learns that according to the CDC, 50% to 70% of chronic diseases (such as obesity, type 2 diabetes, elevated lipids, hypertension, and heart disease) are avoidable through lifestyle improvements including healthier food choices, listening to satiety cues, and increased physical activity.  Sleep Disorders Clinical staff conducted group or individual video education with verbal and written material and guidebook.  Patient learns how good quality and duration of sleep are important to overall health and well-being. Patient also learns about sleep disorders and how they impact health along with recommendations to address them, including discussing with a physician.  Nutrition  Dining Out - Part 2 Clinical staff conducted group or individual video education with verbal and written material and guidebook.  Patient learns how to plan ahead and communicate in order to maximize their dining experience in a healthy and nutritious manner. Included are recommended food choices based on the type of restaurant the patient is visiting.   Fueling a Banker conducted group or individual video education with verbal and written material and guidebook.  There is a strong connection between our food choices and our health. Diseases like obesity and type 2 diabetes are very prevalent and are in  large-part due to lifestyle choices. The Pritikin Eating Plan provides plenty of food and hunger-curbing  satisfaction. It is easy to follow, affordable, and helps reduce health risks.  Menu Workshop  Clinical staff conducted group or individual video education with verbal and written material and guidebook.  Patient learns that restaurant meals can sabotage health goals because they are often packed with calories, fat, sodium, and sugar. Recommendations include strategies to plan ahead and to communicate with the manager, chef, or server to help order a healthier meal.  Planning Your Eating Strategy  Clinical staff conducted group or individual video education with verbal and written material and guidebook.  Patient learns about the Pritikin Eating Plan and its benefit of reducing the risk of disease. The Pritikin Eating Plan does not focus on calories. Instead, it emphasizes high-quality, nutrient-rich foods. By knowing the characteristics of the foods, we choose, we can determine their calorie density and make informed decisions.  Targeting Your Nutrition Priorities  Clinical staff conducted group or individual video education with verbal and written material and guidebook.  Patient learns that lifestyle habits have a tremendous impact on disease risk and progression. This video provides eating and physical activity recommendations based on your personal health goals, such as reducing LDL cholesterol, losing weight, preventing or controlling type 2 diabetes, and reducing high blood pressure.  Vitamins and Minerals  Clinical staff conducted group or individual video education with verbal and written material and guidebook.  Patient learns different ways to obtain key vitamins and minerals, including through a recommended healthy diet. It is important to discuss all supplements you take with your doctor.   Healthy Mind-Set    Smoking Cessation  Clinical staff conducted group or individual video education with verbal and written material and guidebook.  Patient learns that cigarette smoking and  tobacco addiction pose a serious health risk which affects millions of people. Stopping smoking will significantly reduce the risk of heart disease, lung disease, and many forms of cancer. Recommended strategies for quitting are covered, including working with your doctor to develop a successful plan.  Culinary   Becoming a Set designer conducted group or individual video education with verbal and written material and guidebook.  Patient learns that cooking at home can be healthy, cost-effective, quick, and puts them in control. Keys to cooking healthy recipes will include looking at your recipe, assessing your equipment needs, planning ahead, making it simple, choosing cost-effective seasonal ingredients, and limiting the use of added fats, salts, and sugars.  Cooking - Breakfast and Snacks  Clinical staff conducted group or individual video education with verbal and written material and guidebook.  Patient learns how important breakfast is to satiety and nutrition through the entire day. Recommendations include key foods to eat during breakfast to help stabilize blood sugar levels and to prevent overeating at meals later in the day. Planning ahead is also a key component.  Cooking - Educational psychologist conducted group or individual video education with verbal and written material and guidebook.  Patient learns eating strategies to improve overall health, including an approach to cook more at home. Recommendations include thinking of animal protein as a side on your plate rather than center stage and focusing instead on lower calorie dense options like vegetables, fruits, whole grains, and plant-based proteins, such as beans. Making sauces in large quantities to freeze for later and leaving the skin on your vegetables are also recommended to maximize your experience.  Cooking - Healthy  Salads and Dressing Clinical staff conducted group or individual video education with  verbal and written material and guidebook.  Patient learns that vegetables, fruits, whole grains, and legumes are the foundations of the Pritikin Eating Plan. Recommendations include how to incorporate each of these in flavorful and healthy salads, and how to create homemade salad dressings. Proper handling of ingredients is also covered. Cooking - Soups and [employer] - Soups and Desserts Clinical staff conducted group or individual video education with verbal and written material and guidebook.  Patient learns that Pritikin soups and desserts make for easy, nutritious, and delicious snacks and meal components that are low in sodium, fat, sugar, and calorie density, while high in vitamins, minerals, and filling fiber. Recommendations include simple and healthy ideas for soups and desserts.   Overview     The Pritikin Solution Program Overview Clinical staff conducted group or individual video education with verbal and written material and guidebook.  Patient learns that the results of the Pritikin Program have been documented in more than 100 articles published in peer-reviewed journals, and the benefits include reducing risk factors for (and, in some cases, even reversing) high cholesterol, high blood pressure, type 2 diabetes, obesity, and more! An overview of the three key pillars of the Pritikin Program will be covered: eating well, doing regular exercise, and having a healthy mind-set.  WORKSHOPS  Exercise: Exercise Basics: Building Your Action Plan Clinical staff led group instruction and group discussion with PowerPoint presentation and patient guidebook. To enhance the learning environment the use of posters, models and videos may be added. At the conclusion of this workshop, patients will comprehend the difference between physical activity and exercise, as well as the benefits of incorporating both, into their routine. Patients will understand the FITT (Frequency, Intensity, Time,  and Type) principle and how to use it to build an exercise action plan. In addition, safety concerns and other considerations for exercise and cardiac rehab will be addressed by the presenter. The purpose of this lesson is to promote a comprehensive and effective weekly exercise routine in order to improve patients' overall level of fitness.   Managing Heart Disease: Your Path to a Healthier Heart Clinical staff led group instruction and group discussion with PowerPoint presentation and patient guidebook. To enhance the learning environment the use of posters, models and videos may be added.At the conclusion of this workshop, patients will understand the anatomy and physiology of the heart. Additionally, they will understand how Pritikin's three pillars impact the risk factors, the progression, and the management of heart disease.  The purpose of this lesson is to provide a high-level overview of the heart, heart disease, and how the Pritikin lifestyle positively impacts risk factors.  Exercise Biomechanics Clinical staff led group instruction and group discussion with PowerPoint presentation and patient guidebook. To enhance the learning environment the use of posters, models and videos may be added. Patients will learn how the structural parts of their bodies function and how these functions impact their daily activities, movement, and exercise. Patients will learn how to promote a neutral spine, learn how to manage pain, and identify ways to improve their physical movement in order to promote healthy living. The purpose of this lesson is to expose patients to common physical limitations that impact physical activity. Participants will learn practical ways to adapt and manage aches and pains, and to minimize their effect on regular exercise. Patients will learn how to maintain good posture while sitting, walking, and lifting.  Balance  Training and Fall Prevention  Clinical staff led group  instruction and group discussion with PowerPoint presentation and patient guidebook. To enhance the learning environment the use of posters, models and videos may be added. At the conclusion of this workshop, patients will understand the importance of their sensorimotor skills (vision, proprioception, and the vestibular system) in maintaining their ability to balance as they age. Patients will apply a variety of balancing exercises that are appropriate for their current level of function. Patients will understand the common causes for poor balance, possible solutions to these problems, and ways to modify their physical environment in order to minimize their fall risk. The purpose of this lesson is to teach patients about the importance of maintaining balance as they age and ways to minimize their risk of falling.  WORKSHOPS   Nutrition:  Fueling a Ship broker led group instruction and group discussion with PowerPoint presentation and patient guidebook. To enhance the learning environment the use of posters, models and videos may be added. Patients will review the foundational principles of the Pritikin Eating Plan and understand what constitutes a serving size in each of the food groups. Patients will also learn Pritikin-friendly foods that are better choices when away from home and review make-ahead meal and snack options. Calorie density will be reviewed and applied to three nutrition priorities: weight maintenance, weight loss, and weight gain. The purpose of this lesson is to reinforce (in a group setting) the key concepts around what patients are recommended to eat and how to apply these guidelines when away from home by planning and selecting Pritikin-friendly options. Patients will understand how calorie density may be adjusted for different weight management goals.  Mindful Eating  Clinical staff led group instruction and group discussion with PowerPoint presentation and patient  guidebook. To enhance the learning environment the use of posters, models and videos may be added. Patients will briefly review the concepts of the Pritikin Eating Plan and the importance of low-calorie dense foods. The concept of mindful eating will be introduced as well as the importance of paying attention to internal hunger signals. Triggers for non-hunger eating and techniques for dealing with triggers will be explored. The purpose of this lesson is to provide patients with the opportunity to review the basic principles of the Pritikin Eating Plan, discuss the value of eating mindfully and how to measure internal cues of hunger and fullness using the Hunger Scale. Patients will also discuss reasons for non-hunger eating and learn strategies to use for controlling emotional eating.  Targeting Your Nutrition Priorities Clinical staff led group instruction and group discussion with PowerPoint presentation and patient guidebook. To enhance the learning environment the use of posters, models and videos may be added. Patients will learn how to determine their genetic susceptibility to disease by reviewing their family history. Patients will gain insight into the importance of diet as part of an overall healthy lifestyle in mitigating the impact of genetics and other environmental insults. The purpose of this lesson is to provide patients with the opportunity to assess their personal nutrition priorities by looking at their family history, their own health history and current risk factors. Patients will also be able to discuss ways of prioritizing and modifying the Pritikin Eating Plan for their highest risk areas  Menu  Clinical staff led group instruction and group discussion with PowerPoint presentation and patient guidebook. To enhance the learning environment the use of posters, models and videos may be added. Using menus brought in from  local restaurants, or printed from [employer], patients will apply  the Pritikin dining out guidelines that were presented in the [employer] video. Patients will also be able to practice these guidelines in a variety of provided scenarios. The purpose of this lesson is to provide patients with the opportunity to practice hands-on learning of the Pritikin Dining Out guidelines with actual menus and practice scenarios.  Label Reading Clinical staff led group instruction and group discussion with PowerPoint presentation and patient guidebook. To enhance the learning environment the use of posters, models and videos may be added. Patients will review and discuss the Pritikin label reading guidelines presented in Pritikin's Label Reading Educational series video. Using fool labels brought in from local grocery stores and markets, patients will apply the label reading guidelines and determine if the packaged food meet the Pritikin guidelines. The purpose of this lesson is to provide patients with the opportunity to review, discuss, and practice hands-on learning of the Pritikin Label Reading guidelines with actual packaged food labels. Cooking School  Pritikin's [employer] are designed to teach patients ways to prepare quick, simple, and affordable recipes at home. The importance of nutrition's role in chronic disease risk reduction is reflected in its emphasis in the overall Pritikin program. By learning how to prepare essential core Pritikin Eating Plan recipes, patients will increase control over what they eat; be able to customize the flavor of foods without the use of added salt, sugar, or fat; and improve the quality of the food they consume. By learning a set of core recipes which are easily assembled, quickly prepared, and affordable, patients are more likely to prepare more healthy foods at home. These workshops focus on convenient breakfasts, simple entres, side dishes, and desserts which can be prepared with minimal effort and are  consistent with nutrition recommendations for cardiovascular risk reduction. Cooking [employer] are taught by a Armed forces logistics/support/administrative officer (RD) who has been trained by the [employer]. The chef or RD has a clear understanding of the importance of minimizing - if not completely eliminating - added fat, sugar, and sodium in recipes. Throughout the series of Cooking School Workshop sessions, patients will learn about healthy ingredients and efficient methods of cooking to build confidence in their capability to prepare    Cooking School weekly topics:  Adding Flavor- Sodium-Free  Fast and Healthy Breakfasts  Powerhouse Plant-Based Proteins  Satisfying Salads and Dressings  Simple Sides and Sauces  International Cuisine-Spotlight on the [employer] Zones  Delicious Desserts  Savory Soups  [employer] - Meals in a Astronomer Appetizers and Snacks  Comforting Weekend Breakfasts  One-Pot Wonders   Fast Evening Meals  Landscape architect Your Pritikin Plate  WORKSHOPS   Healthy Mindset (Psychosocial):  Focused Goals, Sustainable Changes Clinical staff led group instruction and group discussion with PowerPoint presentation and patient guidebook. To enhance the learning environment the use of posters, models and videos may be added. Patients will be able to apply effective goal setting strategies to establish at least one personal goal, and then take consistent, meaningful action toward that goal. They will learn to identify common barriers to achieving personal goals and develop strategies to overcome them. Patients will also gain an understanding of how our mind-set can impact our ability to achieve goals and the importance of cultivating a positive and growth-oriented mind-set. The purpose of this lesson is to provide patients with a deeper understanding of how to set and  achieve personal goals, as well as the tools and strategies needed to overcome common  obstacles which may arise along the way.  From Head to Heart: The Power of a Healthy Outlook  Clinical staff led group instruction and group discussion with PowerPoint presentation and patient guidebook. To enhance the learning environment the use of posters, models and videos may be added. Patients will be able to recognize and describe the impact of emotions and mood on physical health. They will discover the importance of self-care and explore self-care practices which may work for them. Patients will also learn how to utilize the 4 C's to cultivate a healthier outlook and better manage stress and challenges. The purpose of this lesson is to demonstrate to patients how a healthy outlook is an essential part of maintaining good health, especially as they continue their cardiac rehab journey.  Healthy Sleep for a Healthy Heart Clinical staff led group instruction and group discussion with PowerPoint presentation and patient guidebook. To enhance the learning environment the use of posters, models and videos may be added. At the conclusion of this workshop, patients will be able to demonstrate knowledge of the importance of sleep to overall health, well-being, and quality of life. They will understand the symptoms of, and treatments for, common sleep disorders. Patients will also be able to identify daytime and nighttime behaviors which impact sleep, and they will be able to apply these tools to help manage sleep-related challenges. The purpose of this lesson is to provide patients with a general overview of sleep and outline the importance of quality sleep. Patients will learn about a few of the most common sleep disorders. Patients will also be introduced to the concept of "sleep hygiene," and discover ways to self-manage certain sleeping problems through simple daily behavior changes. Finally, the workshop will motivate patients by clarifying the links between quality sleep and their goals of heart-healthy  living.   Recognizing and Reducing Stress Clinical staff led group instruction and group discussion with PowerPoint presentation and patient guidebook. To enhance the learning environment the use of posters, models and videos may be added. At the conclusion of this workshop, patients will be able to understand the types of stress reactions, differentiate between acute and chronic stress, and recognize the impact that chronic stress has on their health. They will also be able to apply different coping mechanisms, such as reframing negative self-talk. Patients will have the opportunity to practice a variety of stress management techniques, such as deep abdominal breathing, progressive muscle relaxation, and/or guided imagery.  The purpose of this lesson is to educate patients on the role of stress in their lives and to provide healthy techniques for coping with it.  Learning Barriers/Preferences:  Learning Barriers/Preferences - 07/27/23 1126       Learning Barriers/Preferences   Learning Barriers None    Learning Preferences Audio;Computer/Internet;Group Instruction;Individual Instruction;Pictoral;Skilled Demonstration;Verbal Instruction;Video;Written Material          Education Topics:  Knowledge Questionnaire Score:  Knowledge Questionnaire Score - 07/27/23 1136       Knowledge Questionnaire Score   Pre Score 15/28          Core Components/Risk Factors/Patient Goals at Admission:  Personal Goals and Risk Factors at Admission - 07/27/23 1126       Core Components/Risk Factors/Patient Goals on Admission    Weight Management Yes;Weight Maintenance    Intervention Weight Management: Develop a combined nutrition and exercise program designed to reach desired caloric intake, while maintaining appropriate intake of  nutrient and fiber, sodium and fats, and appropriate energy expenditure required for the weight goal.;Weight Management: Provide education and appropriate resources to help  participant work on and attain dietary goals.    Expected Outcomes Short Term: Continue to assess and modify interventions until short term weight is achieved;Long Term: Adherence to nutrition and physical activity/exercise program aimed toward attainment of established weight goal;Understanding recommendations for meals to include 15-35% energy as protein, 25-35% energy from fat, 35-60% energy from carbohydrates, less than 200mg  of dietary cholesterol, 20-35 gm of total fiber daily;Understanding of distribution of calorie intake throughout the day with the consumption of 4-5 meals/snacks    Tobacco Cessation Yes    Number of packs per day 1    Intervention Assist the participant in steps to quit. Provide individualized education and counseling about committing to Tobacco Cessation, relapse prevention, and pharmacological support that can be provided by physician.;Education officer, environmental, assist with locating and accessing local/national Quit Smoking programs, and support quit date choice.    Expected Outcomes Short Term: Will demonstrate readiness to quit, by selecting a quit date.;Long Term: Complete abstinence from all tobacco products for at least 12 months from quit date.;Short Term: Will quit all tobacco product use, adhering to prevention of relapse plan.    Heart Failure Yes    Intervention Provide a combined exercise and nutrition program that is supplemented with education, support and counseling about heart failure. Directed toward relieving symptoms such as shortness of breath, decreased exercise tolerance, and extremity edema.    Expected Outcomes Improve functional capacity of life;Short term: Attendance in program 2-3 days a week with increased exercise capacity. Reported lower sodium intake. Reported increased fruit and vegetable intake. Reports medication compliance.;Short term: Daily weights obtained and reported for increase. Utilizing diuretic protocols set by physician.;Long term:  Adoption of self-care skills and reduction of barriers for early signs and symptoms recognition and intervention leading to self-care maintenance.    Hypertension Yes    Intervention Provide education on lifestyle modifcations including regular physical activity/exercise, weight management, moderate sodium restriction and increased consumption of fresh fruit, vegetables, and low fat dairy, alcohol moderation, and smoking cessation.;Monitor prescription use compliance.    Expected Outcomes Long Term: Maintenance of blood pressure at goal levels.;Short Term: Continued assessment and intervention until BP is < 140/61mm HG in hypertensive participants. < 130/76mm HG in hypertensive participants with diabetes, heart failure or chronic kidney disease.    Lipids Yes    Intervention Provide education and support for participant on nutrition & aerobic/resistive exercise along with prescribed medications to achieve LDL 70mg , HDL >40mg .    Expected Outcomes Short Term: Participant states understanding of desired cholesterol values and is compliant with medications prescribed. Participant is following exercise prescription and nutrition guidelines.;Long Term: Cholesterol controlled with medications as prescribed, with individualized exercise RX and with personalized nutrition plan. Value goals: LDL < 70mg , HDL > 40 mg.          Core Components/Risk Factors/Patient Goals Review:   Goals and Risk Factor Review     Row Name 08/01/23 1625             Core Components/Risk Factors/Patient Goals Review   Personal Goals Review Weight Management/Obesity;Heart Failure;Hypertension;Lipids       Review Shakeila started cardiac rehab on 08/01/23. Journii did well with exercise. Vital signs were stable.       Expected Outcomes Denessa will continue to participate in cardiac rehab for exercise, nutrition and lifestyle modificaitons  Core Components/Risk Factors/Patient Goals at Discharge (Final Review):    Goals and Risk Factor Review - 08/01/23 1625       Core Components/Risk Factors/Patient Goals Review   Personal Goals Review Weight Management/Obesity;Heart Failure;Hypertension;Lipids    Review Azusena started cardiac rehab on 08/01/23. Jailin did well with exercise. Vital signs were stable.    Expected Outcomes Jerriann will continue to participate in cardiac rehab for exercise, nutrition and lifestyle modificaitons          ITP Comments:  ITP Comments     Row Name 07/27/23 1026 08/01/23 1621         ITP Comments Dr. Wilbert Bihari medical director. Introduction to pritikin education/intensive cardiac rehab. Initial orientation packet reviewed with patient. 30 Day ITP Review. Vivianne started cardiac rehab on 08/01/23. Alvine did well with exercise.         Comments: See ITP comments.Hadassah Elpidio Quan RN BSN

## 2023-08-02 ENCOUNTER — Encounter (HOSPITAL_COMMUNITY)
Admission: RE | Admit: 2023-08-02 | Discharge: 2023-08-02 | Discharge status: Home / Self Care | Source: Ambulatory Visit | Attending: Cardiology

## 2023-08-02 DIAGNOSIS — I5022 Chronic systolic (congestive) heart failure: Secondary | ICD-10-CM | POA: Diagnosis not present

## 2023-08-02 DIAGNOSIS — Z952 Presence of prosthetic heart valve: Secondary | ICD-10-CM

## 2023-08-04 ENCOUNTER — Encounter (HOSPITAL_COMMUNITY)
Admission: RE | Admit: 2023-08-04 | Discharge: 2023-08-04 | Disposition: A | Discharge status: Home / Self Care | Source: Ambulatory Visit | Attending: Cardiology | Admitting: Cardiology

## 2023-08-04 DIAGNOSIS — Z952 Presence of prosthetic heart valve: Secondary | ICD-10-CM

## 2023-08-04 DIAGNOSIS — I5022 Chronic systolic (congestive) heart failure: Secondary | ICD-10-CM | POA: Diagnosis not present

## 2023-08-07 ENCOUNTER — Encounter (HOSPITAL_COMMUNITY)
Admission: RE | Admit: 2023-08-07 | Discharge: 2023-08-07 | Disposition: A | Discharge status: Home / Self Care | Source: Ambulatory Visit | Attending: Cardiology | Admitting: Cardiology

## 2023-08-07 DIAGNOSIS — Z952 Presence of prosthetic heart valve: Secondary | ICD-10-CM

## 2023-08-07 DIAGNOSIS — I5022 Chronic systolic (congestive) heart failure: Secondary | ICD-10-CM | POA: Diagnosis not present

## 2023-08-08 ENCOUNTER — Other Ambulatory Visit (HOSPITAL_COMMUNITY): Payer: Self-pay | Admitting: Emergency Medicine

## 2023-08-08 ENCOUNTER — Other Ambulatory Visit: Payer: Self-pay

## 2023-08-08 NOTE — Progress Notes (Addendum)
 Med rec only x 2 weeks.  Losartan  added to regimen.  Called in refills for Ez[place]imbe, Jardiance   Mary Sharps, EMT-Paramedic [phone] 08/08/2023

## 2023-08-09 ENCOUNTER — Other Ambulatory Visit: Payer: Self-pay

## 2023-08-09 ENCOUNTER — Other Ambulatory Visit (HOSPITAL_COMMUNITY): Payer: Self-pay | Admitting: Emergency Medicine

## 2023-08-09 ENCOUNTER — Encounter (HOSPITAL_COMMUNITY)
Admission: RE | Admit: 2023-08-09 | Discharge: 2023-08-09 | Disposition: A | Discharge status: Home / Self Care | Source: Ambulatory Visit | Attending: Cardiology | Admitting: Cardiology

## 2023-08-09 DIAGNOSIS — Z952 Presence of prosthetic heart valve: Secondary | ICD-10-CM

## 2023-08-09 DIAGNOSIS — I5022 Chronic systolic (congestive) heart failure: Secondary | ICD-10-CM | POA: Diagnosis not present

## 2023-08-11 ENCOUNTER — Encounter (HOSPITAL_COMMUNITY)
Admission: RE | Admit: 2023-08-11 | Discharge: 2023-08-11 | Disposition: A | Discharge status: Home / Self Care | Source: Ambulatory Visit | Attending: Cardiology | Admitting: Cardiology

## 2023-08-11 DIAGNOSIS — I5022 Chronic systolic (congestive) heart failure: Secondary | ICD-10-CM | POA: Diagnosis not present

## 2023-08-11 DIAGNOSIS — Z952 Presence of prosthetic heart valve: Secondary | ICD-10-CM

## 2023-08-14 ENCOUNTER — Encounter (HOSPITAL_COMMUNITY)
Admission: RE | Admit: 2023-08-14 | Discharge: 2023-08-14 | Disposition: A | Discharge status: Home / Self Care | Source: Ambulatory Visit | Attending: Cardiology | Admitting: Cardiology

## 2023-08-14 DIAGNOSIS — Z952 Presence of prosthetic heart valve: Secondary | ICD-10-CM

## 2023-08-14 DIAGNOSIS — I5022 Chronic systolic (congestive) heart failure: Secondary | ICD-10-CM | POA: Diagnosis not present

## 2023-08-16 ENCOUNTER — Encounter (HOSPITAL_COMMUNITY)
Admission: RE | Admit: 2023-08-16 | Discharge: 2023-08-16 | Disposition: A | Discharge status: Home / Self Care | Source: Ambulatory Visit | Attending: Cardiology

## 2023-08-16 DIAGNOSIS — I5022 Chronic systolic (congestive) heart failure: Secondary | ICD-10-CM | POA: Diagnosis not present

## 2023-08-16 DIAGNOSIS — Z952 Presence of prosthetic heart valve: Secondary | ICD-10-CM

## 2023-08-16 NOTE — Progress Notes (Incomplete)
 ***In Progress***    Advanced Heart Failure Clinic Note    PCP: Delbert Clam, MD HF Cardiologist: Dr. Rolan  HPI: Leslie Walker is a 58 y.o. with a history of HTN, hyperlipidemia, stroke cypotogenic 2020/LINQ placement, CAD, polysubstance abuse heroin/cocaine abuse, and chronic HFrEF.    Admitted 09/2020 with NSTEMI. CT negative for PE but did show multivessel coronary calcifications. Echo showed EF 45-50%, RV normal, moderate aortic valve stenosis. Had cath with overlapping DES to mid RCA, also with 70-80% left circumflex, left main patent, mid LAD 55%. Recommended DAPT with Aspirin  + ticagrelor   x 12 months.     Admitted 11/2021 with CHF. 3 months PTA she had run out of all medications. Says she didn't call for refills.  Last used cocaine 7 days prior to admission and heroin about 2 weeks prior. She was diuresed with IV furosemide  and GDMT titrated. She was enrolled in paramedicine program. Echo with EF 20-25%, RV okay, moderate to severe AI with possible mobile mass. TEE showed EF 20%, mildly reduced RV, bicuspid aortic valve with moderate to severe AI.  R/LHC in 11/2021 showed 75% mid LCx stenosis, elevated PCWP and LVEDP but normal RV pressure, moderate pulmonary venous hypertension, CI low by thermo but preserved by Fick. cMRI in 11/2021 showed LVEF 18%, RVEF 48%, bicuspid aortic valve with severe AI, mid-wall LGE in inferolateral wall could be due to prior myocarditis but patient has CAD in LCx chronically.  Structural heart team reviewed her studies, not felt to be a candidate for TAVR.  Echo 03/2022: EF 50-55%, normal RV, bicuspid aortic valve with moderate AS and probable moderate AI.  TEE (4/24) showed LVEF 55%, normal RV, bicuspid AoV with fused noncoronary and right coronary cusps, mild-moderate AS with mean gradient 19 and AVA 1.57 cm^2, moderate to severe AI.  Echo 01/2023: EF 55-60%, no RWMA, RV normal, small pericardial effusion present, trivial MR, bicuspic AV, AV regurgitation  mod-severe. Mild-mod AV stenosis. AV mean gradient 11 mmHg  [facility] 03/2023 showed normal filling and PA pressures, preserved CI and 80% stenosis in the mid LCx.   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 Post op course was relatively unremarkable. She was discharged on carvedilol  12.5 mg BID, spironolactone  12.5 mg daily, and Jardiance  10 mg daily.  Intraoperative echo 04/2023 showed LVEF 65%, RV normal, trivial MR, mild TR.   On 05/19/2023 she had follow up appointment. Overall felt fine. She was not SOB walking outside uphill. Denied palpitations, abnormal bleeding, CP, dizziness, edema, or PND/Orthopnea. Appetite was ok. No fever or chills. Weight at home 142 pounds. Reported taking all medications. Stopped smoking since surgery, no further drug use, no ETOH use. 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 Interested in cardiac rehab. BP was 108/66, noted too low to restart Entresto .   Echo 06/14/2023 showed LVEF 70-75%, RV function mildly reduced, moderate pericardial effusion, mild MR.    She presented to clinic 07/20/2023 for routine follow up. Reported doing well. Denied CP. No exertional dyspnea but reported chronic fatigue. BP elevated, 146/90. BPs also elevated at home, paramedicine notes, last home visit BP was 170/90. Patient was hesitant to restart Entresto  due to previous dizziness. She was started on losartan  25 mg daily instead.  Today he returns to HF clinic for pharmacist medication titration.   Overall feeling ***. Dizziness, lightheadedness, fatigue:  Chest pain or palpitations:  How is your breathing?: *** SOB: Able to complete all ADLs. Activity level ***  Weight at home pounds. Takes furosemide /torsemide /bumex *** mg *** daily.  LEE PND/Orthopnea  Appetite *** Low-salt diet:   Physical Exam Cost/affordability of meds   HF Medications: Carvedilol  12.5 mg BID Losartan  25 mg daily  Spironolactone  12.5 mg daily  Jardiance  10 mg daily  Furosemide  20 mg PRN  Has the patient been  experiencing any side effects to the medications prescribed?  {YES NO:22349}  Does the patient have any problems obtaining medications due to transportation or finances?   {YES NO:22349}  Understanding of regimen: {excellent/good/fair/poor:19665} Understanding of indications: {excellent/good/fair/poor:19665} Potential of compliance: {excellent/good/fair/poor:19665} Patient understands to avoid NSAIDs. Patient understands to avoid decongestants.    Pertinent Lab Values: 07/31/2023: Serum creatinine 0.86, BUN 9, Potassium 4.3, Sodium 139  Vital Signs: Weight: *** lbs (last clinic weight: 146.6 lbs) Blood pressure: *** mmHg Heart rate: *** bpm  Assessment/Plan: 1. Chronic systolic CHF:  Nonischemic CMP.  Echo 11/2021 with EF 20-25%, global hypokinesis, moderate LV dilation, normal RV, mod-severe AI. RHC showed significantly elevated PCWP and LVEDP but normal RA pressure; CI 2.4 Fick/2.0 thermo. Cause of cardiomyopathy uncertain, CAD does not appear to have progressed (75% mLCx stenosis on 11/2021 cath). Possibly some component of substance abuse (cocaine) though by cardiac MRI in 11/2021, prior myocarditis is also a concern (non-coronary LGE noted).  LV EF 18% on cMRI with relatively preserved RV function. Aortic insufficiency also likely plays a role (moderate to severe on TEE in 11/2021).  Repeat echo 03/2022 showed improvement with EF 50-55%, normal RV, bicuspid aortic valve with moderate AS and moderate AI. Echo 01/2023 EF 55-60%, RV normal, moderate/severe AI. Improvement in EF suggests a role for cocaine in her cardiomyopathy (she had quit using drugs).  Now s/p AVR 04/2023. Echo 05/2023 EF 75%, RV mildly reduced.  - NYHA ***.  Volume *** - Continue carvedilol  12.5 mg BID - Continue losartan  25 mg daily - Continue spironolactone  12.5 mg daily  - Continue Jardiance  10 mg daily   2. CAD:  DES to RCA in 09/2020. Cath in 11/2021 with patent RCA stent, 50% proximal LAD, 75% mid LCx.  The LCx  stenosis appeared similar to the prior cath, no intervention on LCx . Lp(a) markedly elevated with age-advanced CAD. Stable w/o CP  - Continue Plavix  75 mg daily  - Continue atorvastatin  40 mg daily  - With elevated Lp(a), will need to be aggressive with lipid management.  LDL 74 11/2022, she is on Repatha  + Zetia .  3. Aortic valve disorder: Moderate-severe AI on 11/2021 echo and TEE. TEE showed bicuspid aortic valve with fusion of right and noncoronary cusps. Moderate-severe AI by TEE. By cardiac MRI, aortic valve regurgitant fraction was 38% which suggests severe AI. She was deemed not to be a TAVR candidate, but with improvement in LV function, she should be SAVR candidate. TEE in 04/2022 showed mild-moderate AS with mean gradient 19 and AVA 1.57 cm^2, moderate to severe AI. Echo 01/2023: bicuspic AV, AV regurgitation mod-severe. Mild-mod AV stenosis. AV mean gradient 11 mmHg. Now s/p AV replacement 04/2023. Echo 5/25 w/ stable prosthesis, mean gradient 17 mmHg - Recommended screening in 1st degree relatives for bicuspid aortic valve - Refer to Cardiac Rehab. They have contacted her. I encouraged there to get enrolled w/ program ***  4. Polysubstance abuse:  Cocaine/heroin. She reports cessation and EF has improved. *** - Congratulated  5. H/o CVA:  She is on Plavix .  6. Smoking:  Quit since hospitalization - Congratulated.   7. SDOH:  Continue paramedicine, appreciate their assistance  Follow up in 6 weeks with MD.  Tinnie Redman, PharmD, BCPS, BCCP, CPP Heart Failure Clinic Pharmacist [phone]

## 2023-08-17 ENCOUNTER — Other Ambulatory Visit: Payer: Self-pay

## 2023-08-17 ENCOUNTER — Inpatient Hospital Stay (HOSPITAL_COMMUNITY): Admission: RE | Admit: 2023-08-17 | Source: Ambulatory Visit

## 2023-08-18 ENCOUNTER — Encounter (HOSPITAL_COMMUNITY): Admission: RE | Admit: 2023-08-18 | Source: Ambulatory Visit

## 2023-08-21 ENCOUNTER — Encounter (HOSPITAL_COMMUNITY)
Admission: RE | Admit: 2023-08-21 | Discharge: 2023-08-21 | Disposition: A | Discharge status: Home / Self Care | Source: Ambulatory Visit | Attending: Cardiology | Admitting: Cardiology

## 2023-08-21 DIAGNOSIS — Z952 Presence of prosthetic heart valve: Secondary | ICD-10-CM | POA: Insufficient documentation

## 2023-08-23 ENCOUNTER — Encounter (HOSPITAL_COMMUNITY)
Admission: RE | Admit: 2023-08-23 | Discharge: 2023-08-23 | Disposition: A | Discharge status: Home / Self Care | Source: Ambulatory Visit | Attending: Cardiology

## 2023-08-23 ENCOUNTER — Other Ambulatory Visit: Payer: Self-pay

## 2023-08-23 DIAGNOSIS — Z952 Presence of prosthetic heart valve: Secondary | ICD-10-CM | POA: Diagnosis not present

## 2023-08-25 ENCOUNTER — Telehealth (HOSPITAL_COMMUNITY): Payer: Self-pay | Admitting: Cardiology

## 2023-08-25 ENCOUNTER — Encounter (HOSPITAL_COMMUNITY)
Admission: RE | Admit: 2023-08-25 | Discharge: 2023-08-25 | Disposition: A | Discharge status: Home / Self Care | Source: Ambulatory Visit | Attending: Cardiology | Admitting: Cardiology

## 2023-08-25 DIAGNOSIS — Z952 Presence of prosthetic heart valve: Secondary | ICD-10-CM

## 2023-08-25 NOTE — Telephone Encounter (Signed)
 Called to confirm/remind patient of their appointment at the Advanced Heart Failure Clinic on 08/25/2023.   Appointment:   [x] Confirmed  [] Left mess   [] No answer/No voice mail  [] VM Full/unable to leave message  [] Phone not in service  Patient reminded to bring all medications and/or complete list.  Confirmed patient has transportation. Gave directions, instructed to utilize valet parking.

## 2023-08-25 NOTE — Progress Notes (Signed)
 CARDIAC REHAB PHASE 2  Reviewed home exercise with pt today. Pt is tolerating exercise well. Pt will continue to exercise on her own by walking for 30-60 minutes per session 1-2 days a week in addition to the 3 days in CRP2. Advised pt on THRR, RPE scale, hydration and temperature/humidity precautions. Reinforced S/S to stop exercise and when to call MD vs 911. Encouraged warm up cool down and stretches with exercise sessions. Pt verbalized understanding, all questions were answered and pt was given a copy to take home.    Alec GORMAN Finder ACSM-CEP 08/25/2023 5:04 PM

## 2023-08-25 NOTE — Progress Notes (Signed)
 Cardiac Individual Treatment Plan  Patient Details  Name: Leslie Walker MRN: [id number] Date of Birth: 01/15/1966 Referring Provider:   Flowsheet Row CARDIAC REHAB PHASE II ORIENTATION from 07/27/2023 in [facility]  Referring Provider Ezra Shuck, MD    Initial Encounter Date:  Flowsheet Row CARDIAC REHAB PHASE II ORIENTATION from 07/27/2023 in [facility]  Date 07/27/23    Visit Diagnosis: 04/28/23 S/P aortic valve replacement  Patient's Home Medications on Admission:  Current Outpatient Medications:    aspirin  EC 81 MG tablet, Take 1 tablet (81 mg total) by mouth daily. Swallow whole., Disp: , Rfl:    atorvastatin  (LIPITOR) 40 MG tablet, Take 1 tablet (40 mg total) by mouth daily at 6 PM., Disp: 90 tablet, Rfl: 3   carvedilol  (COREG ) 12.5 MG tablet, Take 1 tablet (12.5 mg total) by mouth 2 (two) times daily with a meal., Disp: 60 tablet, Rfl: 1   clopidogrel  (PLAVIX ) 75 MG tablet, Take 1 tablet (75 mg total) by mouth daily., Disp: 90 tablet, Rfl: 3   empagliflozin  (JARDIANCE ) 10 MG TABS tablet, Take 1 tablet (10 mg total) by mouth daily., Disp: 30 tablet, Rfl: 3   Evolocumab  (REPATHA  SURECLICK) 140 MG/ML SOAJ, Inject 140 mg into the skin every 14 (fourteen) days., Disp: 6 mL, Rfl: 3   ezetimibe  (ZETIA ) 10 MG tablet, Take 1 tablet (10 mg total) by mouth daily., Disp: 90 tablet, Rfl: 3   furosemide  (LASIX ) 20 MG tablet, Take 1 tablet (20 mg total) by mouth as needed for fluid or edema. For weight gain of 3lb in 24 hours or 5lb in a week., Disp: 8 tablet, Rfl: 4   hydrOXYzine  (ATARAX ) 25 MG tablet, Take 1 tablet (25 mg total) by mouth at bedtime as needed., Disp: 30 tablet, Rfl: 1   losartan  (COZAAR ) 25 MG tablet, Take 1 tablet (25 mg total) by mouth daily., Disp: 90 tablet, Rfl: 3   spironolactone  (ALDACTONE ) 25 MG tablet, Take 0.5 tablets (12.5 mg total) by mouth  daily., Disp: 30 tablet, Rfl: 1  Past Medical History: Past Medical History:  Diagnosis Date   Chronic systolic heart failure (HCC)    Cocaine abuse (HCC)    last use about 1 year ago (04/26/23)   Coronary artery disease    Dyslipidemia    Hypertension    Noncompliance w/medication treatment due to intermit use of medication    Stroke (HCC) 2021   no deficits    Tobacco Use: Social History   Tobacco Use  Smoking Status Some Days   Current packs/day: 1.00   Types: Cigars, Cigarettes  Smokeless Tobacco Never  Tobacco Comments   A cigarette or black n mild once in a while    Labs: Review Flowsheet  More data exists      Latest Ref Rng & Units 04/04/2022 12/07/2022 04/05/2023 04/26/2023 04/28/2023  Labs for ITP Cardiac and Pulmonary Rehab  Cholestrol 0 - 200 mg/dL 884  863  - - -  LDL (calc) 0 - 99 mg/dL 55  74  - - -  HDL-C >59 mg/dL 44  51  - - -  Trlycerides <150 mg/dL 78  56  - - -  Hemoglobin A1c 4.8 - 5.6 % - - - 5.5  -  PH, Arterial 7.35 - 7.45 - - - - 7.371  7.376  7.418  7.403  7.426  7.357   PCO2 arterial 32 -  48 mmHg - - - - 40.4  39.0  40.0  40.6  35.4  42.6   Bicarbonate 20.0 - 28.0 mmol/L - - 27.3  25.9  - 23.2  22.8  23.0  25.8  25.3  30.0  23.2  23.9   TCO2 22 - 32 mmol/L - - 29  27  - 24  24  24  27  27  27  27  31  24  26  25  28    Acid-base deficit 0.0 - 2.0 mmol/L - - - - 2.0  2.0  2.0  1.0  2.0   O2 Saturation % - - 74  74  - 100  100  100  100  100  84  100  100     Details       Multiple values from one day are sorted in reverse-chronological order         Capillary Blood Glucose: Lab Results  Component Value Date   GLUCAP 95 05/01/2023   GLUCAP 98 05/01/2023   GLUCAP 131 (H) 04/30/2023   GLUCAP 102 (H) 04/30/2023   GLUCAP 139 (H) 04/30/2023     Exercise Target Goals: Exercise Program Goal: Individual exercise prescription set using results from initial 6 min walk test and THRR while considering  patient's activity barriers and safety.    Exercise Prescription Goal: Initial exercise prescription builds to 30-45 minutes a day of aerobic activity, 2-3 days per week.  Home exercise guidelines will be given to patient during program as part of exercise prescription that the participant will acknowledge.  Activity Barriers & Risk Stratification:  Activity Barriers & Cardiac Risk Stratification - 07/27/23 1124       Activity Barriers & Cardiac Risk Stratification   Activity Barriers Decreased Ventricular Function;Joint Problems    Cardiac Risk Stratification High   <5 METs on         6 Minute Walk:  6 Minute Walk     Row Name 07/27/23 1412         6 Minute Walk   Phase Initial     Distance 1495 feet     Walk Time 6 minutes     # of Rest Breaks 0     MPH 2.83     METS 4.04     RPE 9     Perceived Dyspnea  0     VO2 Peak 14.14     Symptoms No     Resting HR 59 bpm     Resting BP 132/80     Resting Oxygen Saturation  100 %     Exercise Oxygen Saturation  during 6 min walk 100 %     Max Ex. HR 86 bpm     Max Ex. BP 168/90     2 Minute Post BP 154/84        Oxygen Initial Assessment:   Oxygen Re-Evaluation:   Oxygen Discharge (Final Oxygen Re-Evaluation):   Initial Exercise Prescription:  Initial Exercise Prescription - 07/27/23 1100       Date of Initial Exercise RX and Referring Provider   Date 07/27/23    Referring Provider Ezra Shuck, MD    Expected Discharge Date 09/06/23      Recumbant Elliptical   Level 1    RPM 55    Watts 70    Minutes 15    METs 2.5      Track   Laps 17    Minutes 15  METs 2.3      Prescription Details   Frequency (times per week) 3    Duration Progress to 30 minutes of continuous aerobic without signs/symptoms of physical distress      Intensity   THRR 40-80% of Max Heartrate 65-130    Ratings of Perceived Exertion 11-13    Perceived Dyspnea 0-4      Progression   Progression Continue progressive overload as per policy without  signs/symptoms or physical distress.      Resistance Training   Training Prescription Yes    Weight 2    Reps 10-15          Perform Capillary Blood Glucose checks as needed.  Exercise Prescription Changes:   Exercise Prescription Changes     Row Name 07/31/23 1600 08/25/23 1658           Response to Exercise   Blood Pressure (Admit) 112/78 96/60      Blood Pressure (Exercise) 128/78 --      Blood Pressure (Exit) 106/80 106/60      Heart Rate (Admit) 88 bpm 81 bpm      Heart Rate (Exercise) 91 bpm 99 bpm      Heart Rate (Exit) 74 bpm 90 bpm      Rating of Perceived Exertion (Exercise) 11 9      Perceived Dyspnea (Exercise) 0 0      Symptoms None None      Comments Pt first day in CRP2 Reviewed MEY's goals and home ExRx      Duration Continue with 30 min of aerobic exercise without signs/symptoms of physical distress. Continue with 30 min of aerobic exercise without signs/symptoms of physical distress.      Intensity THRR unchanged THRR unchanged        Progression   Progression Continue to progress workloads to maintain intensity without signs/symptoms of physical distress. Continue to progress workloads to maintain intensity without signs/symptoms of physical distress.      Average METs 2.51 5.47        Resistance Training   Training Prescription Yes Yes      Weight 2 2      Reps 10-15 10-15      Time -- 10 Minutes        Interval Training   Interval Training No No        Recumbant Elliptical   Level 1 --      RPM 38 --      Watts 38 --      Minutes 15 --      METs 2.1 --        Track   Laps 15 35      Minutes 15 30      METs 2.91 5.47  Sidetracked while talking. Ended up walking 30 mins        Home Exercise Plan   Plans to continue exercise at -- Home (comment)      Frequency -- Add 2 additional days to program exercise sessions.      Initial Home Exercises Provided -- 08/25/23         Exercise Comments:   Exercise Comments     Row Name  07/31/23 1640 08/25/23 1703         Exercise Comments Pt first day in CRP2 program. Pt tolerated exercise well with an avg MET level of 2.5 with no s/sx or pain. Will continue to progress workloads as tolerated. Reviewed MET's, goals and home ExRx.  Pt tolerated exercise well with an average MET level of 5.47. We were talking and lost track of time. Stayed on the track 30 mins. She will exercise by adding 1-2 days walking with her grandson for 30-60 mins. She feels good about her goals and in already feeling an increase in strength and energy. She says she feels like getting up and going.         Exercise Goals and Review:   Exercise Goals     Row Name 07/27/23 1026             Exercise Goals   Increase Physical Activity Yes       Intervention Provide advice, education, support and counseling about physical activity/exercise needs.;Develop an individualized exercise prescription for aerobic and resistive training based on initial evaluation findings, risk stratification, comorbidities and participant's personal goals.       Expected Outcomes Short Term: Attend rehab on a regular basis to increase amount of physical activity.;Long Term: Exercising regularly at least 3-5 days a week.;Long Term: Add in home exercise to make exercise part of routine and to increase amount of physical activity.       Increase Strength and Stamina Yes       Intervention Provide advice, education, support and counseling about physical activity/exercise needs.;Develop an individualized exercise prescription for aerobic and resistive training based on initial evaluation findings, risk stratification, comorbidities and participant's personal goals.       Expected Outcomes Short Term: Increase workloads from initial exercise prescription for resistance, speed, and METs.;Short Term: Perform resistance training exercises routinely during rehab and add in resistance training at home;Long Term: Improve cardiorespiratory  fitness, muscular endurance and strength as measured by increased METs and functional capacity ( )       Able to understand and use rate of perceived exertion (RPE) scale Yes       Intervention Provide education and explanation on how to use RPE scale       Expected Outcomes Short Term: Able to use RPE daily in rehab to express subjective intensity level;Long Term:  Able to use RPE to guide intensity level when exercising independently       Knowledge and understanding of Target Heart Rate Range (THRR) Yes       Intervention Provide education and explanation of THRR including how the numbers were predicted and where they are located for reference       Expected Outcomes Short Term: Able to state/look up THRR;Long Term: Able to use THRR to govern intensity when exercising independently;Short Term: Able to use daily as guideline for intensity in rehab       Understanding of Exercise Prescription Yes       Intervention Provide education, explanation, and written materials on patient's individual exercise prescription       Expected Outcomes Short Term: Able to explain program exercise prescription;Long Term: Able to explain home exercise prescription to exercise independently          Exercise Goals Re-Evaluation :  Exercise Goals Re-Evaluation     Row Name 07/31/23 1639 08/25/23 1700           Exercise Goal Re-Evaluation   Exercise Goals Review Increase Physical Activity;Understanding of Exercise Prescription;Increase Strength and Stamina;Knowledge and understanding of Target Heart Rate Range (THRR);Able to understand and use rate of perceived exertion (RPE) scale Increase Physical Activity;Understanding of Exercise Prescription;Increase Strength and Stamina;Knowledge and understanding of Target Heart Rate Range (THRR);Able to understand and use rate of perceived exertion (RPE)  scale      Comments Pt first day in CRP2 program. Pt is learning her THRR, ExRx, and RPE scale. Alyha tolerated  exercise well with no s/sx or pain and an avg MET level of 2.5. Reviewed MET's, goals and home ExRx. Pt tolerated exercise well with an average MET level of 5.47. We were talking and lost track of time. Stayed on the track 30 mins. She will exercise by adding 1-2 days walking with her grandson for 30-60 mins. She feels good about her goals and in already feeling an increase in strength and energy. She says she feels like getting up and going.      Expected Outcomes Will continue to progress workloads as tolerated without s/sx. Will continue to progress workloads as tolerated without s/sx.         Discharge Exercise Prescription (Final Exercise Prescription Changes):  Exercise Prescription Changes - 08/25/23 1658       Response to Exercise   Blood Pressure (Admit) 96/60    Blood Pressure (Exit) 106/60    Heart Rate (Admit) 81 bpm    Heart Rate (Exercise) 99 bpm    Heart Rate (Exit) 90 bpm    Rating of Perceived Exertion (Exercise) 9    Perceived Dyspnea (Exercise) 0    Symptoms None    Comments Reviewed MEY's goals and home ExRx    Duration Continue with 30 min of aerobic exercise without signs/symptoms of physical distress.    Intensity THRR unchanged      Progression   Progression Continue to progress workloads to maintain intensity without signs/symptoms of physical distress.    Average METs 5.47      Resistance Training   Training Prescription Yes    Weight 2    Reps 10-15    Time 10 Minutes      Interval Training   Interval Training No      Track   Laps 35    Minutes 30    METs 5.47   Sidetracked while talking. Ended up walking 30 mins     Home Exercise Plan   Plans to continue exercise at Home (comment)    Frequency Add 2 additional days to program exercise sessions.    Initial Home Exercises Provided 08/25/23          Nutrition:  Target Goals: Understanding of nutrition guidelines, daily intake of sodium 1500mg , cholesterol 200mg , calories 30% from fat and 7%  or less from saturated fats, daily to have 5 or more servings of fruits and vegetables.  Biometrics:  Pre Biometrics - 07/27/23 1025       Pre Biometrics   Waist Circumference 36 inches    Hip Circumference 41 inches    Waist to Hip Ratio 0.88 %    Triceps Skinfold 15 mm    % Body Fat 34.3 %    Grip Strength 26 kg    Flexibility 14.75 in    Single Leg Stand 4.6 seconds           Nutrition Therapy Plan and Nutrition Goals:  Nutrition Therapy & Goals - 07/31/23 1527       Nutrition Therapy   Diet Heart Healthy Diet    Drug/Food Interactions Statins/Certain Fruits      Personal Nutrition Goals   Nutrition Goal Patient to identify strategies for reducing cardiovascular risk by attending the [facility] education and nutrition series weekly.    Personal Goal #2 Patient to improve diet quality by using the plate method as  a guide for meal planning to include lean protein/plant protein, fruits, vegetables, whole grains, nonfat dairy as part of a well-balanced diet.    Comments Patient has medical history of HTN, hyperlipidemia, stroke cypotogenic 2020/LINQ placement, CAD, polysubstance abuse heroin/cocaine abuse, and chronic HFrEF, NSTEMI. LDL is not quite at goal (74); she continues zetia , repatha , lipitor. Patient will benefit from participation in intensive cardiac rehab for nutrition education, exercise, and lifestyle modification.      Intervention Plan   Intervention Prescribe, educate and counsel regarding individualized specific dietary modifications aiming towards targeted core components such as weight, hypertension, lipid management, diabetes, heart failure and other comorbidities.;Nutrition handout(s) given to patient.    Expected Outcomes Short Term Goal: Understand basic principles of dietary content, such as calories, fat, sodium, cholesterol and nutrients.;Long Term Goal: Adherence to prescribed nutrition plan.          Nutrition Assessments:  MEDIFICTS Score  Key: >=70 Need to make dietary changes  40-70 Heart Healthy Diet <= 40 Therapeutic Level Cholesterol Diet    Picture Your Plate Scores: <59 Unhealthy dietary pattern with much room for improvement. 41-50 Dietary pattern unlikely to meet recommendations for good health and room for improvement. 51-60 More healthful dietary pattern, with some room for improvement.  >60 Healthy dietary pattern, although there may be some specific behaviors that could be improved.    Nutrition Goals Re-Evaluation:  Nutrition Goals Re-Evaluation     Row Name 07/31/23 1527             Goals   Current Weight 149 lb 0.5 oz (67.6 kg)       Comment LDL 74, A1c WNL       Expected Outcome Patient has medical history of HTN, hyperlipidemia, stroke cypotogenic 2020/LINQ placement, CAD, polysubstance abuse heroin/cocaine abuse, and chronic HFrEF, NSTEMI. LDL is not quite at goal (74); she continues zetia , repatha , lipitor. Patient will benefit from participation in intensive cardiac rehab for nutrition education, exercise, and lifestyle modification.          Nutrition Goals Re-Evaluation:  Nutrition Goals Re-Evaluation     Row Name 07/31/23 1527             Goals   Current Weight 149 lb 0.5 oz (67.6 kg)       Comment LDL 74, A1c WNL       Expected Outcome Patient has medical history of HTN, hyperlipidemia, stroke cypotogenic 2020/LINQ placement, CAD, polysubstance abuse heroin/cocaine abuse, and chronic HFrEF, NSTEMI. LDL is not quite at goal (74); she continues zetia , repatha , lipitor. Patient will benefit from participation in intensive cardiac rehab for nutrition education, exercise, and lifestyle modification.          Nutrition Goals Discharge (Final Nutrition Goals Re-Evaluation):  Nutrition Goals Re-Evaluation - 07/31/23 1527       Goals   Current Weight 149 lb 0.5 oz (67.6 kg)    Comment LDL 74, A1c WNL    Expected Outcome Patient has medical history of HTN, hyperlipidemia, stroke  cypotogenic 2020/LINQ placement, CAD, polysubstance abuse heroin/cocaine abuse, and chronic HFrEF, NSTEMI. LDL is not quite at goal (74); she continues zetia , repatha , lipitor. Patient will benefit from participation in intensive cardiac rehab for nutrition education, exercise, and lifestyle modification.          Psychosocial: Target Goals: Acknowledge presence or absence of significant depression and/or stress, maximize coping skills, provide positive support system. Participant is able to verbalize types and ability to use techniques and skills needed for reducing  stress and depression.  Initial Review & Psychosocial Screening:  Initial Psych Review & Screening - 07/27/23 1126       Initial Review   Current issues with None Identified      Family Dynamics   Good Support System? Yes   daughter     Barriers   Psychosocial barriers to participate in program There are no identifiable barriers or psychosocial needs.      Screening Interventions   Interventions Encouraged to exercise;Provide feedback about the scores to participant    Expected Outcomes Long Term goal: The participant improves quality of Life and PHQ9 Scores as seen by post scores and/or verbalization of changes;Short Term goal: Identification and review with participant of any Quality of Life or Depression concerns found by scoring the questionnaire.          Quality of Life Scores:  Quality of Life - 07/27/23 1148       Quality of Life   Select Quality of Life      Quality of Life Scores   Health/Function Pre 25.17 %    Socioeconomic Pre 30 %    Psych/Spiritual Pre 30 %    Family Pre 30 %    GLOBAL Pre 27.93 %         Scores of 19 and below usually indicate a poorer quality of life in these areas.  A difference of  2-3 points is a clinically meaningful difference.  A difference of 2-3 points in the total score of the Quality of Life Index has been associated with significant improvement in overall quality  of life, self-image, physical symptoms, and general health in studies assessing change in quality of life.  PHQ-9: Review Flowsheet       07/27/2023 05/09/2023 12/23/2021  Depression screen PHQ 2/9  Decreased Interest 0 0 1  Down, Depressed, Hopeless 0 0 0  PHQ - 2 Score 0 0 1  Altered sleeping 0 0 0  Tired, decreased energy 1 0 1  Change in appetite 0 2 0  Feeling bad or failure about yourself  0 0 0  Trouble concentrating 0 0 0  Moving slowly or fidgety/restless 0 1 0  Suicidal thoughts 0 0 0  PHQ-9 Score 1 3 2   Difficult doing work/chores Not difficult at all - Not difficult at all   Interpretation of Total Score  Total Score Depression Severity:  1-4 = Minimal depression, 5-9 = Mild depression, 10-14 = Moderate depression, 15-19 = Moderately severe depression, 20-27 = Severe depression   Psychosocial Evaluation and Intervention:   Psychosocial Re-Evaluation:  Psychosocial Re-Evaluation     Row Name 08/01/23 1622 08/25/23 1443           Psychosocial Re-Evaluation   Current issues with None Identified None Identified      Interventions Encouraged to attend Cardiac Rehabilitation for the exercise Encouraged to attend Cardiac Rehabilitation for the exercise      Continue Psychosocial Services  No Follow up required No Follow up required         Psychosocial Discharge (Final Psychosocial Re-Evaluation):  Psychosocial Re-Evaluation - 08/25/23 1443       Psychosocial Re-Evaluation   Current issues with None Identified    Interventions Encouraged to attend Cardiac Rehabilitation for the exercise    Continue Psychosocial Services  No Follow up required          Vocational Rehabilitation: Provide vocational rehab assistance to qualifying candidates.   Vocational Rehab Evaluation & Intervention:  Vocational Rehab -  07/27/23 1126       Initial Vocational Rehab Evaluation & Intervention   Assessment shows need for Vocational Rehabilitation No   works at [employer]: Education Goals: Education classes will be provided on a weekly basis, covering required topics. Participant will state understanding/return demonstration of topics presented.    Education     Row Name 07/31/23 1400     Education   Cardiac Education Topics [facility]   Psychologist, forensic General Education   General Education Hypertension and Heart Disease   Instruction Review Code 1- Verbalizes Understanding   Class Start Time 1403   Class Stop Time 1445   Class Time Calculation (min) 42 min    Row Name 08/02/23 1500     Education   Cardiac Education Topics [employer]   Customer service manager   Weekly Topic Powerhouse Plant-Based Proteins   Instruction Review Code 1- Verbalizes Understanding   Class Start Time 1400   Class Stop Time 1440   Class Time Calculation (min) 40 min    Row Name 08/04/23 1500     Education   Cardiac Education Topics [facility]   [employer] Workshops     Workshops   Educator Nurse   [employer] Exercise   Exercise Workshop Managing Heart Disease: Your Path to a Healthier Heart   Instruction Review Code 1- Verbalizes Understanding   Class Start Time 1403   Class Stop Time 1453   Class Time Calculation (min) 50 min    Row Name 08/07/23 1400     Education   Cardiac Education Topics [employer]   Geographical information systems officer Psychosocial   Psychosocial Workshop From Head to Heart: The Power of a Healthy Outlook   Instruction Review Code 1- Verbalizes Understanding   Class Start Time 1400   Class Stop Time 1445   Class Time Calculation (min) 45 min    Row Name 08/09/23 1500     Education   Cardiac Education Topics [employer]   Customer service manager   Weekly Topic Tasty Appetizers and Snacks   Instruction Review Code 1- Verbalizes Understanding    Class Start Time 1400   Class Stop Time 1440   Class Time Calculation (min) 40 min    Row Name 08/11/23 1400     Education   Cardiac Education Topics [facility]   Hospital doctor Education   General Education Heart Disease Risk Reduction   Instruction Review Code 1- Verbalizes Understanding   Class Start Time 1403   Class Stop Time 1437   Class Time Calculation (min) 34 min    Row Name 08/16/23 1400     Education   Cardiac Education Topics [facility]   Customer service manager   Weekly Topic Adding Flavor - Sodium-Free   Instruction Review Code 1- Verbalizes Understanding   Class Start Time 1345   Class Stop Time 1435   Class Time Calculation (min) 50 min    Row Name 08/21/23 1500     Education   Cardiac Education Topics [facility]   [employer]  Workshops   Geophysical data processor Nutrition   Nutrition Workshop Label Reading   Instruction Review Code 1- Teaching laboratory technician Start Time 1400   Class Stop Time 1450   Class Time Calculation (min) 50 min    Row Name 08/23/23 1500     Education   Cardiac Education Topics [employer]   Customer service manager   Weekly Topic Fast and Healthy Breakfasts   Instruction Review Code 1- Verbalizes Understanding   Class Start Time 1400   Class Stop Time 1440   Class Time Calculation (min) 40 min    Row Name 08/25/23 1500     Education   Cardiac Education Topics [facility]   Licensed conveyancer Nutrition   Nutrition Other   Instruction Review Code 1- Verbalizes Understanding   Class Start Time 1400   Class Stop Time 1445   Class Time Calculation (min) 45 min      Core Videos: Exercise    Move It!  Clinical staff conducted group or individual video education with verbal and written material and  guidebook.  Patient learns the recommended Pritikin exercise program. Exercise with the goal of living a long, healthy life. Some of the health benefits of exercise include controlled diabetes, healthier blood pressure levels, improved cholesterol levels, improved heart and lung capacity, improved sleep, and better body composition. Everyone should speak with their doctor before starting or changing an exercise routine.  Biomechanical Limitations Clinical staff conducted group or individual video education with verbal and written material and guidebook.  Patient learns how biomechanical limitations can impact exercise and how we can mitigate and possibly overcome limitations to have an impactful and balanced exercise routine.  Body Composition Clinical staff conducted group or individual video education with verbal and written material and guidebook.  Patient learns that body composition (ratio of muscle mass to fat mass) is a key component to assessing overall fitness, rather than body weight alone. Increased fat mass, especially visceral belly fat, can put us  at increased risk for metabolic syndrome, type 2 diabetes, heart disease, and even death. It is recommended to combine diet and exercise (cardiovascular and resistance training) to improve your body composition. Seek guidance from your physician and exercise physiologist before implementing an exercise routine.  Exercise Action Plan Clinical staff conducted group or individual video education with verbal and written material and guidebook.  Patient learns the recommended strategies to achieve and enjoy long-term exercise adherence, including variety, self-motivation, self-efficacy, and positive decision making. Benefits of exercise include fitness, good health, weight management, more energy, better sleep, less stress, and overall well-being.  Medical   Heart Disease Risk Reduction Clinical staff conducted group or individual video education  with verbal and written material and guidebook.  Patient learns our heart is our most vital organ as it circulates oxygen, nutrients, white blood cells, and hormones throughout the entire body, and carries waste away. Data supports a plant-based eating plan like the Pritikin Program for its effectiveness in slowing progression of and reversing heart disease. The video provides a number of recommendations to address heart disease.   Metabolic Syndrome and Belly Fat  Clinical staff conducted group or individual video education with verbal and written material and guidebook.  Patient learns what metabolic syndrome is, how it leads to heart disease, and how one can reverse it and keep it from  coming back. You have metabolic syndrome if you have 3 of the following 5 criteria: abdominal obesity, high blood pressure, high triglycerides, low HDL cholesterol, and high blood sugar.  Hypertension and Heart Disease Clinical staff conducted group or individual video education with verbal and written material and guidebook.  Patient learns that high blood pressure, or hypertension, is very common in the [place] . Hypertension is largely due to excessive salt intake, but other important risk factors include being overweight, physical inactivity, drinking too much alcohol, smoking, and not eating enough potassium from fruits and vegetables. High blood pressure is a leading risk factor for heart attack, stroke, congestive heart failure, dementia, kidney failure, and premature death. Long-term effects of excessive salt intake include stiffening of the arteries and thickening of heart muscle and organ damage. Recommendations include ways to reduce hypertension and the risk of heart disease.  Diseases of Our Time - Focusing on Diabetes Clinical staff conducted group or individual video education with verbal and written material and guidebook.  Patient learns why the best way to stop diseases of our time is prevention,  through food and other lifestyle changes. Medicine (such as prescription pills and surgeries) is often only a Band-Aid on the problem, not a long-term solution. Most common diseases of our time include obesity, type 2 diabetes, hypertension, heart disease, and cancer. The Pritikin Program is recommended and has been proven to help reduce, reverse, and/or prevent the damaging effects of metabolic syndrome.  Nutrition   Overview of the Pritikin Eating Plan  Clinical staff conducted group or individual video education with verbal and written material and guidebook.  Patient learns about the Pritikin Eating Plan for disease risk reduction. The Pritikin Eating Plan emphasizes a wide variety of unrefined, minimally-processed carbohydrates, like fruits, vegetables, whole grains, and legumes. Go, Caution, and Stop food choices are explained. Plant-based and lean animal proteins are emphasized. Rationale provided for low sodium intake for blood pressure control, low added sugars for blood sugar stabilization, and low added fats and oils for coronary artery disease risk reduction and weight management.  Calorie Density  Clinical staff conducted group or individual video education with verbal and written material and guidebook.  Patient learns about calorie density and how it impacts the Pritikin Eating Plan. Knowing the characteristics of the food you choose will help you decide whether those foods will lead to weight gain or weight loss, and whether you want to consume more or less of them. Weight loss is usually a side effect of the Pritikin Eating Plan because of its focus on low calorie-dense foods.  Label Reading  Clinical staff conducted group or individual video education with verbal and written material and guidebook.  Patient learns about the Pritikin recommended label reading guidelines and corresponding recommendations regarding calorie density, added sugars, sodium content, and whole grains.  Dining  Out - Part 1  Clinical staff conducted group or individual video education with verbal and written material and guidebook.  Patient learns that restaurant meals can be sabotaging because they can be so high in calories, fat, sodium, and/or sugar. Patient learns recommended strategies on how to positively address this and avoid unhealthy pitfalls.  Facts on Fats  Clinical staff conducted group or individual video education with verbal and written material and guidebook.  Patient learns that lifestyle modifications can be just as effective, if not more so, as many medications for lowering your risk of heart disease. A Pritikin lifestyle can help to reduce your risk of inflammation and atherosclerosis (  cholesterol build-up, or plaque, in the artery walls). Lifestyle interventions such as dietary choices and physical activity address the cause of atherosclerosis. A review of the types of fats and their impact on blood cholesterol levels, along with dietary recommendations to reduce fat intake is also included.  Nutrition Action Plan  Clinical staff conducted group or individual video education with verbal and written material and guidebook.  Patient learns how to incorporate Pritikin recommendations into their lifestyle. Recommendations include planning and keeping personal health goals in mind as an important part of their success.  Healthy Mind-Set    Healthy Minds, Bodies, Hearts  Clinical staff conducted group or individual video education with verbal and written material and guidebook.  Patient learns how to identify when they are stressed. Video will discuss the impact of that stress, as well as the many benefits of stress management. Patient will also be introduced to stress management techniques. The way we think, act, and feel has an impact on our hearts.  How Our Thoughts Can Heal Our Hearts  Clinical staff conducted group or individual video education with verbal and written material and  guidebook.  Patient learns that negative thoughts can cause depression and anxiety. This can result in negative lifestyle behavior and serious health problems. Cognitive behavioral therapy is an effective method to help control our thoughts in order to change and improve our emotional outlook.  Additional Videos:  Exercise    Improving Performance  Clinical staff conducted group or individual video education with verbal and written material and guidebook.  Patient learns to use a non-linear approach by alternating intensity levels and lengths of time spent exercising to help burn more calories and lose more body fat. Cardiovascular exercise helps improve heart health, metabolism, hormonal balance, blood sugar control, and recovery from fatigue. Resistance training improves strength, endurance, balance, coordination, reaction time, metabolism, and muscle mass. Flexibility exercise improves circulation, posture, and balance. Seek guidance from your physician and exercise physiologist before implementing an exercise routine and learn your capabilities and proper form for all exercise.  Introduction to Yoga  Clinical staff conducted group or individual video education with verbal and written material and guidebook.  Patient learns about yoga, a discipline of the coming together of mind, breath, and body. The benefits of yoga include improved flexibility, improved range of motion, better posture and core strength, increased lung function, weight loss, and positive self-image. Yoga's heart health benefits include lowered blood pressure, healthier heart rate, decreased cholesterol and triglyceride levels, improved immune function, and reduced stress. Seek guidance from your physician and exercise physiologist before implementing an exercise routine and learn your capabilities and proper form for all exercise.  Medical   Aging: Enhancing Your Quality of Life  Clinical staff conducted group or individual  video education with verbal and written material and guidebook.  Patient learns key strategies and recommendations to stay in good physical health and enhance quality of life, such as prevention strategies, having an advocate, securing a Health Care Proxy and Power of Attorney, and keeping a list of medications and system for tracking them. It also discusses how to avoid risk for bone loss.  Biology of Weight Control  Clinical staff conducted group or individual video education with verbal and written material and guidebook.  Patient learns that weight gain occurs because we consume more calories than we burn (eating more, moving less). Even if your body weight is normal, you may have higher ratios of fat compared to muscle mass. Too much body fat  puts you at increased risk for cardiovascular disease, heart attack, stroke, type 2 diabetes, and obesity-related cancers. In addition to exercise, following the Pritikin Eating Plan can help reduce your risk.  Decoding Lab Results  Clinical staff conducted group or individual video education with verbal and written material and guidebook.  Patient learns that lab test reflects one measurement whose values change over time and are influenced by many factors, including medication, stress, sleep, exercise, food, hydration, pre-existing medical conditions, and more. It is recommended to use the knowledge from this video to become more involved with your lab results and evaluate your numbers to speak with your doctor.   Diseases of Our Time - Overview  Clinical staff conducted group or individual video education with verbal and written material and guidebook.  Patient learns that according to the CDC, 50% to 70% of chronic diseases (such as obesity, type 2 diabetes, elevated lipids, hypertension, and heart disease) are avoidable through lifestyle improvements including healthier food choices, listening to satiety cues, and increased physical activity.  Sleep  Disorders Clinical staff conducted group or individual video education with verbal and written material and guidebook.  Patient learns how good quality and duration of sleep are important to overall health and well-being. Patient also learns about sleep disorders and how they impact health along with recommendations to address them, including discussing with a physician.  Nutrition  Dining Out - Part 2 Clinical staff conducted group or individual video education with verbal and written material and guidebook.  Patient learns how to plan ahead and communicate in order to maximize their dining experience in a healthy and nutritious manner. Included are recommended food choices based on the type of restaurant the patient is visiting.   Fueling a Banker conducted group or individual video education with verbal and written material and guidebook.  There is a strong connection between our food choices and our health. Diseases like obesity and type 2 diabetes are very prevalent and are in large-part due to lifestyle choices. The Pritikin Eating Plan provides plenty of food and hunger-curbing satisfaction. It is easy to follow, affordable, and helps reduce health risks.  Menu Workshop  Clinical staff conducted group or individual video education with verbal and written material and guidebook.  Patient learns that restaurant meals can sabotage health goals because they are often packed with calories, fat, sodium, and sugar. Recommendations include strategies to plan ahead and to communicate with the manager, chef, or server to help order a healthier meal.  Planning Your Eating Strategy  Clinical staff conducted group or individual video education with verbal and written material and guidebook.  Patient learns about the Pritikin Eating Plan and its benefit of reducing the risk of disease. The Pritikin Eating Plan does not focus on calories. Instead, it emphasizes high-quality,  nutrient-rich foods. By knowing the characteristics of the foods, we choose, we can determine their calorie density and make informed decisions.  Targeting Your Nutrition Priorities  Clinical staff conducted group or individual video education with verbal and written material and guidebook.  Patient learns that lifestyle habits have a tremendous impact on disease risk and progression. This video provides eating and physical activity recommendations based on your personal health goals, such as reducing LDL cholesterol, losing weight, preventing or controlling type 2 diabetes, and reducing high blood pressure.  Vitamins and Minerals  Clinical staff conducted group or individual video education with verbal and written material and guidebook.  Patient learns different ways to obtain key  vitamins and minerals, including through a recommended healthy diet. It is important to discuss all supplements you take with your doctor.   Healthy Mind-Set    Smoking Cessation  Clinical staff conducted group or individual video education with verbal and written material and guidebook.  Patient learns that cigarette smoking and tobacco addiction pose a serious health risk which affects millions of people. Stopping smoking will significantly reduce the risk of heart disease, lung disease, and many forms of cancer. Recommended strategies for quitting are covered, including working with your doctor to develop a successful plan.  Culinary   Becoming a Set designer conducted group or individual video education with verbal and written material and guidebook.  Patient learns that cooking at home can be healthy, cost-effective, quick, and puts them in control. Keys to cooking healthy recipes will include looking at your recipe, assessing your equipment needs, planning ahead, making it simple, choosing cost-effective seasonal ingredients, and limiting the use of added fats, salts, and sugars.  Cooking -  Breakfast and Snacks  Clinical staff conducted group or individual video education with verbal and written material and guidebook.  Patient learns how important breakfast is to satiety and nutrition through the entire day. Recommendations include key foods to eat during breakfast to help stabilize blood sugar levels and to prevent overeating at meals later in the day. Planning ahead is also a key component.  Cooking - Educational psychologist conducted group or individual video education with verbal and written material and guidebook.  Patient learns eating strategies to improve overall health, including an approach to cook more at home. Recommendations include thinking of animal protein as a side on your plate rather than center stage and focusing instead on lower calorie dense options like vegetables, fruits, whole grains, and plant-based proteins, such as beans. Making sauces in large quantities to freeze for later and leaving the skin on your vegetables are also recommended to maximize your experience.  Cooking - Healthy Salads and Dressing Clinical staff conducted group or individual video education with verbal and written material and guidebook.  Patient learns that vegetables, fruits, whole grains, and legumes are the foundations of the Pritikin Eating Plan. Recommendations include how to incorporate each of these in flavorful and healthy salads, and how to create homemade salad dressings. Proper handling of ingredients is also covered. Cooking - Soups and [employer] - Soups and Desserts Clinical staff conducted group or individual video education with verbal and written material and guidebook.  Patient learns that Pritikin soups and desserts make for easy, nutritious, and delicious snacks and meal components that are low in sodium, fat, sugar, and calorie density, while high in vitamins, minerals, and filling fiber. Recommendations include simple and healthy ideas for soups and  desserts.   Overview     The Pritikin Solution Program Overview Clinical staff conducted group or individual video education with verbal and written material and guidebook.  Patient learns that the results of the Pritikin Program have been documented in more than 100 articles published in peer-reviewed journals, and the benefits include reducing risk factors for (and, in some cases, even reversing) high cholesterol, high blood pressure, type 2 diabetes, obesity, and more! An overview of the three key pillars of the Pritikin Program will be covered: eating well, doing regular exercise, and having a healthy mind-set.  WORKSHOPS  Exercise: Exercise Basics: Building Your Action Plan Clinical staff led group instruction and group discussion with PowerPoint presentation and patient  guidebook. To enhance the learning environment the use of posters, models and videos may be added. At the conclusion of this workshop, patients will comprehend the difference between physical activity and exercise, as well as the benefits of incorporating both, into their routine. Patients will understand the FITT (Frequency, Intensity, Time, and Type) principle and how to use it to build an exercise action plan. In addition, safety concerns and other considerations for exercise and cardiac rehab will be addressed by the presenter. The purpose of this lesson is to promote a comprehensive and effective weekly exercise routine in order to improve patients' overall level of fitness.   Managing Heart Disease: Your Path to a Healthier Heart Clinical staff led group instruction and group discussion with PowerPoint presentation and patient guidebook. To enhance the learning environment the use of posters, models and videos may be added.At the conclusion of this workshop, patients will understand the anatomy and physiology of the heart. Additionally, they will understand how Pritikin's three pillars impact the risk factors, the  progression, and the management of heart disease.  The purpose of this lesson is to provide a high-level overview of the heart, heart disease, and how the Pritikin lifestyle positively impacts risk factors.  Exercise Biomechanics Clinical staff led group instruction and group discussion with PowerPoint presentation and patient guidebook. To enhance the learning environment the use of posters, models and videos may be added. Patients will learn how the structural parts of their bodies function and how these functions impact their daily activities, movement, and exercise. Patients will learn how to promote a neutral spine, learn how to manage pain, and identify ways to improve their physical movement in order to promote healthy living. The purpose of this lesson is to expose patients to common physical limitations that impact physical activity. Participants will learn practical ways to adapt and manage aches and pains, and to minimize their effect on regular exercise. Patients will learn how to maintain good posture while sitting, walking, and lifting.  Balance Training and Fall Prevention  Clinical staff led group instruction and group discussion with PowerPoint presentation and patient guidebook. To enhance the learning environment the use of posters, models and videos may be added. At the conclusion of this workshop, patients will understand the importance of their sensorimotor skills (vision, proprioception, and the vestibular system) in maintaining their ability to balance as they age. Patients will apply a variety of balancing exercises that are appropriate for their current level of function. Patients will understand the common causes for poor balance, possible solutions to these problems, and ways to modify their physical environment in order to minimize their fall risk. The purpose of this lesson is to teach patients about the importance of maintaining balance as they age and ways to  minimize their risk of falling.  WORKSHOPS   Nutrition:  Fueling a Ship broker led group instruction and group discussion with PowerPoint presentation and patient guidebook. To enhance the learning environment the use of posters, models and videos may be added. Patients will review the foundational principles of the Pritikin Eating Plan and understand what constitutes a serving size in each of the food groups. Patients will also learn Pritikin-friendly foods that are better choices when away from home and review make-ahead meal and snack options. Calorie density will be reviewed and applied to three nutrition priorities: weight maintenance, weight loss, and weight gain. The purpose of this lesson is to reinforce (in a group setting) the key concepts around what patients are  recommended to eat and how to apply these guidelines when away from home by planning and selecting Pritikin-friendly options. Patients will understand how calorie density may be adjusted for different weight management goals.  Mindful Eating  Clinical staff led group instruction and group discussion with PowerPoint presentation and patient guidebook. To enhance the learning environment the use of posters, models and videos may be added. Patients will briefly review the concepts of the Pritikin Eating Plan and the importance of low-calorie dense foods. The concept of mindful eating will be introduced as well as the importance of paying attention to internal hunger signals. Triggers for non-hunger eating and techniques for dealing with triggers will be explored. The purpose of this lesson is to provide patients with the opportunity to review the basic principles of the Pritikin Eating Plan, discuss the value of eating mindfully and how to measure internal cues of hunger and fullness using the Hunger Scale. Patients will also discuss reasons for non-hunger eating and learn strategies to use for controlling emotional  eating.  Targeting Your Nutrition Priorities Clinical staff led group instruction and group discussion with PowerPoint presentation and patient guidebook. To enhance the learning environment the use of posters, models and videos may be added. Patients will learn how to determine their genetic susceptibility to disease by reviewing their family history. Patients will gain insight into the importance of diet as part of an overall healthy lifestyle in mitigating the impact of genetics and other environmental insults. The purpose of this lesson is to provide patients with the opportunity to assess their personal nutrition priorities by looking at their family history, their own health history and current risk factors. Patients will also be able to discuss ways of prioritizing and modifying the Pritikin Eating Plan for their highest risk areas  Menu  Clinical staff led group instruction and group discussion with PowerPoint presentation and patient guidebook. To enhance the learning environment the use of posters, models and videos may be added. Using menus brought in from [employer], or printed from [employer], patients will apply the Pritikin dining out guidelines that were presented in the [employer] video. Patients will also be able to practice these guidelines in a variety of provided scenarios. The purpose of this lesson is to provide patients with the opportunity to practice hands-on learning of the Pritikin Dining Out guidelines with actual menus and practice scenarios.  Label Reading Clinical staff led group instruction and group discussion with PowerPoint presentation and patient guidebook. To enhance the learning environment the use of posters, models and videos may be added. Patients will review and discuss the Pritikin label reading guidelines presented in [employer]'s Label Reading Educational series video. Using fool labels brought in from local grocery stores and  markets, patients will apply the label reading guidelines and determine if the packaged food meet the Pritikin guidelines. The purpose of this lesson is to provide patients with the opportunity to review, discuss, and practice hands-on learning of the Pritikin Label Reading guidelines with actual packaged food labels. Cooking School  P[employer]'s [employer] are designed to teach patients ways to prepare quick, simple, and affordable recipes at home. The importance of nutrition's role in chronic disease risk reduction is reflected in its emphasis in the overall Pritikin program. By learning how to prepare essential core Pritikin Eating Plan recipes, patients will increase control over what they eat; be able to customize the flavor of foods without the use of added salt, sugar, or fat; and improve  the quality of the food they consume. By learning a set of core recipes which are easily assembled, quickly prepared, and affordable, patients are more likely to prepare more healthy foods at home. These workshops focus on convenient breakfasts, simple entres, side dishes, and desserts which can be prepared with minimal effort and are consistent with nutrition recommendations for cardiovascular risk reduction. Cooking [employer] are taught by a Armed forces logistics/support/administrative officer (RD) who has been trained by the [employer]. The chef or RD has a clear understanding of the importance of minimizing - if not completely eliminating - added fat, sugar, and sodium in recipes. Throughout the series of Cooking School Workshop sessions, patients will learn about healthy ingredients and efficient methods of cooking to build confidence in their capability to prepare    Cooking School weekly topics:  Adding Flavor- Sodium-Free  Fast and Healthy Breakfasts  Powerhouse Plant-Based Proteins  Satisfying Salads and Dressings  Simple Sides and Sauces  International Cuisine-Spotlight on the [employer]  Zones  Delicious Desserts  Savory Soups  [employer] - Meals in a Astronomer Appetizers and Snacks  Comforting Weekend Breakfasts  One-Pot Wonders   Fast Evening Meals  Landscape architect Your Pritikin Plate  WORKSHOPS   Healthy Mindset (Psychosocial):  Focused Goals, Sustainable Changes Clinical staff led group instruction and group discussion with PowerPoint presentation and patient guidebook. To enhance the learning environment the use of posters, models and videos may be added. Patients will be able to apply effective goal setting strategies to establish at least one personal goal, and then take consistent, meaningful action toward that goal. They will learn to identify common barriers to achieving personal goals and develop strategies to overcome them. Patients will also gain an understanding of how our mind-set can impact our ability to achieve goals and the importance of cultivating a positive and growth-oriented mind-set. The purpose of this lesson is to provide patients with a deeper understanding of how to set and achieve personal goals, as well as the tools and strategies needed to overcome common obstacles which may arise along the way.  From Head to Heart: The Power of a Healthy Outlook  Clinical staff led group instruction and group discussion with PowerPoint presentation and patient guidebook. To enhance the learning environment the use of posters, models and videos may be added. Patients will be able to recognize and describe the impact of emotions and mood on physical health. They will discover the importance of self-care and explore self-care practices which may work for them. Patients will also learn how to utilize the 4 C's to cultivate a healthier outlook and better manage stress and challenges. The purpose of this lesson is to demonstrate to patients how a healthy outlook is an essential part of maintaining good health, especially as they continue their  cardiac rehab journey.  Healthy Sleep for a Healthy Heart Clinical staff led group instruction and group discussion with PowerPoint presentation and patient guidebook. To enhance the learning environment the use of posters, models and videos may be added. At the conclusion of this workshop, patients will be able to demonstrate knowledge of the importance of sleep to overall health, well-being, and quality of life. They will understand the symptoms of, and treatments for, common sleep disorders. Patients will also be able to identify daytime and nighttime behaviors which impact sleep, and they will be able to apply these tools to help manage sleep-related challenges. The purpose of this lesson is to provide  patients with a general overview of sleep and outline the importance of quality sleep. Patients will learn about a few of the most common sleep disorders. Patients will also be introduced to the concept of "sleep hygiene," and discover ways to self-manage certain sleeping problems through simple daily behavior changes. Finally, the workshop will motivate patients by clarifying the links between quality sleep and their goals of heart-healthy living.   Recognizing and Reducing Stress Clinical staff led group instruction and group discussion with PowerPoint presentation and patient guidebook. To enhance the learning environment the use of posters, models and videos may be added. At the conclusion of this workshop, patients will be able to understand the types of stress reactions, differentiate between acute and chronic stress, and recognize the impact that chronic stress has on their health. They will also be able to apply different coping mechanisms, such as reframing negative self-talk. Patients will have the opportunity to practice a variety of stress management techniques, such as deep abdominal breathing, progressive muscle relaxation, and/or guided imagery.  The purpose of this lesson is to educate  patients on the role of stress in their lives and to provide healthy techniques for coping with it.  Learning Barriers/Preferences:  Learning Barriers/Preferences - 07/27/23 1126       Learning Barriers/Preferences   Learning Barriers None    Learning Preferences Audio;Computer/Internet;Group Instruction;Individual Instruction;Pictoral;Skilled Demonstration;Verbal Instruction;Video;Written Material          Education Topics:  Knowledge Questionnaire Score:  Knowledge Questionnaire Score - 07/27/23 1136       Knowledge Questionnaire Score   Pre Score 15/28          Core Components/Risk Factors/Patient Goals at Admission:  Personal Goals and Risk Factors at Admission - 07/27/23 1126       Core Components/Risk Factors/Patient Goals on Admission    Weight Management Yes;Weight Maintenance    Intervention Weight Management: Develop a combined nutrition and exercise program designed to reach desired caloric intake, while maintaining appropriate intake of nutrient and fiber, sodium and fats, and appropriate energy expenditure required for the weight goal.;Weight Management: Provide education and appropriate resources to help participant work on and attain dietary goals.    Expected Outcomes Short Term: Continue to assess and modify interventions until short term weight is achieved;Long Term: Adherence to nutrition and physical activity/exercise program aimed toward attainment of established weight goal;Understanding recommendations for meals to include 15-35% energy as protein, 25-35% energy from fat, 35-60% energy from carbohydrates, less than 200mg  of dietary cholesterol, 20-35 gm of total fiber daily;Understanding of distribution of calorie intake throughout the day with the consumption of 4-5 meals/snacks    Tobacco Cessation Yes    Number of packs per day 1    Intervention Assist the participant in steps to quit. Provide individualized education and counseling about committing to  Tobacco Cessation, relapse prevention, and pharmacological support that can be provided by physician.;Education officer, environmental, assist with locating and accessing local/national Quit Smoking programs, and support quit date choice.    Expected Outcomes Short Term: Will demonstrate readiness to quit, by selecting a quit date.;Long Term: Complete abstinence from all tobacco products for at least 12 months from quit date.;Short Term: Will quit all tobacco product use, adhering to prevention of relapse plan.    Heart Failure Yes    Intervention Provide a combined exercise and nutrition program that is supplemented with education, support and counseling about heart failure. Directed toward relieving symptoms such as shortness of breath, decreased exercise tolerance, and  extremity edema.    Expected Outcomes Improve functional capacity of life;Short term: Attendance in program 2-3 days a week with increased exercise capacity. Reported lower sodium intake. Reported increased fruit and vegetable intake. Reports medication compliance.;Short term: Daily weights obtained and reported for increase. Utilizing diuretic protocols set by physician.;Long term: Adoption of self-care skills and reduction of barriers for early signs and symptoms recognition and intervention leading to self-care maintenance.    Hypertension Yes    Intervention Provide education on lifestyle modifcations including regular physical activity/exercise, weight management, moderate sodium restriction and increased consumption of fresh fruit, vegetables, and low fat dairy, alcohol moderation, and smoking cessation.;Monitor prescription use compliance.    Expected Outcomes Long Term: Maintenance of blood pressure at goal levels.;Short Term: Continued assessment and intervention until BP is < 140/85mm HG in hypertensive participants. < 130/69mm HG in hypertensive participants with diabetes, heart failure or chronic kidney disease.    Lipids Yes     Intervention Provide education and support for participant on nutrition & aerobic/resistive exercise along with prescribed medications to achieve LDL 70mg , HDL >40mg .    Expected Outcomes Short Term: Participant states understanding of desired cholesterol values and is compliant with medications prescribed. Participant is following exercise prescription and nutrition guidelines.;Long Term: Cholesterol controlled with medications as prescribed, with individualized exercise RX and with personalized nutrition plan. Value goals: LDL < 70mg , HDL > 40 mg.          Core Components/Risk Factors/Patient Goals Review:   Goals and Risk Factor Review     Row Name 08/01/23 1625 08/25/23 1446           Core Components/Risk Factors/Patient Goals Review   Personal Goals Review Weight Management/Obesity;Heart Failure;Hypertension;Lipids Weight Management/Obesity;Heart Failure;Hypertension;Lipids      Review Elenna started cardiac rehab on 08/01/23. Alizandra did well with exercise. Vital signs were stable. Jazae started cardiac rehab on 08/01/23. Astha continues to do  well with exercise. Vital signs have been stable.      Expected Outcomes Terrilyn will continue to participate in cardiac rehab for exercise, nutrition and lifestyle modificaitons Raylyn will continue to participate in cardiac rehab for exercise, nutrition and lifestyle modificaitons         Core Components/Risk Factors/Patient Goals at Discharge (Final Review):   Goals and Risk Factor Review - 08/25/23 1446       Core Components/Risk Factors/Patient Goals Review   Personal Goals Review Weight Management/Obesity;Heart Failure;Hypertension;Lipids    Review Amiliah started cardiac rehab on 08/01/23. Adriel continues to do  well with exercise. Vital signs have been stable.    Expected Outcomes Remy will continue to participate in cardiac rehab for exercise, nutrition and lifestyle modificaitons          ITP Comments:  ITP  Comments     Row Name 07/27/23 1026 08/01/23 1621 08/25/23 1438       ITP Comments Dr. Wilbert Bihari medical director. Introduction to pritikin education/intensive cardiac rehab. Initial orientation packet reviewed with patient. 30 Day ITP Review. Chamaine started cardiac rehab on 08/01/23. Shatora did well with exercise. 30 Day ITP Review Everlene has good attendance and participaiton  with exercise. at cardiac rehab.        Comments: See ITP comments.Hadassah Elpidio Quan RN BSN

## 2023-08-28 ENCOUNTER — Other Ambulatory Visit: Payer: Self-pay

## 2023-08-28 ENCOUNTER — Ambulatory Visit (HOSPITAL_COMMUNITY): Payer: Self-pay | Admitting: Cardiology

## 2023-08-28 ENCOUNTER — Encounter (HOSPITAL_COMMUNITY)
Admission: RE | Admit: 2023-08-28 | Discharge: 2023-08-28 | Disposition: A | Discharge status: Home / Self Care | Source: Ambulatory Visit | Attending: Cardiology

## 2023-08-28 ENCOUNTER — Ambulatory Visit (HOSPITAL_COMMUNITY)
Admission: RE | Admit: 2023-08-28 | Discharge: 2023-08-28 | Disposition: A | Discharge status: Home / Self Care | Source: Ambulatory Visit | Attending: Cardiology | Admitting: Cardiology

## 2023-08-28 ENCOUNTER — Encounter (HOSPITAL_COMMUNITY): Payer: Self-pay | Admitting: Cardiology

## 2023-08-28 VITALS — BP 120/88 | HR 86 | Wt 145.0 lb

## 2023-08-28 DIAGNOSIS — Z7902 Long term (current) use of antithrombotics/antiplatelets: Secondary | ICD-10-CM | POA: Diagnosis not present

## 2023-08-28 DIAGNOSIS — I252 Old myocardial infarction: Secondary | ICD-10-CM | POA: Diagnosis not present

## 2023-08-28 DIAGNOSIS — I428 Other cardiomyopathies: Secondary | ICD-10-CM | POA: Diagnosis not present

## 2023-08-28 DIAGNOSIS — I340 Nonrheumatic mitral (valve) insufficiency: Secondary | ICD-10-CM | POA: Insufficient documentation

## 2023-08-28 DIAGNOSIS — Z953 Presence of xenogenic heart valve: Secondary | ICD-10-CM | POA: Diagnosis not present

## 2023-08-28 DIAGNOSIS — Z7984 Long term (current) use of oral hypoglycemic drugs: Secondary | ICD-10-CM | POA: Diagnosis not present

## 2023-08-28 DIAGNOSIS — Z7982 Long term (current) use of aspirin: Secondary | ICD-10-CM | POA: Diagnosis not present

## 2023-08-28 DIAGNOSIS — Z79899 Other long term (current) drug therapy: Secondary | ICD-10-CM | POA: Diagnosis not present

## 2023-08-28 DIAGNOSIS — I2510 Atherosclerotic heart disease of native coronary artery without angina pectoris: Secondary | ICD-10-CM | POA: Insufficient documentation

## 2023-08-28 DIAGNOSIS — Z8673 Personal history of transient ischemic attack (TIA), and cerebral infarction without residual deficits: Secondary | ICD-10-CM | POA: Insufficient documentation

## 2023-08-28 DIAGNOSIS — F1111 Opioid abuse, in remission: Secondary | ICD-10-CM | POA: Diagnosis not present

## 2023-08-28 DIAGNOSIS — I3139 Other pericardial effusion (noninflammatory): Secondary | ICD-10-CM | POA: Diagnosis not present

## 2023-08-28 DIAGNOSIS — F1411 Cocaine abuse, in remission: Secondary | ICD-10-CM | POA: Diagnosis not present

## 2023-08-28 DIAGNOSIS — F17210 Nicotine dependence, cigarettes, uncomplicated: Secondary | ICD-10-CM | POA: Diagnosis not present

## 2023-08-28 DIAGNOSIS — I351 Nonrheumatic aortic (valve) insufficiency: Secondary | ICD-10-CM

## 2023-08-28 DIAGNOSIS — I272 Pulmonary hypertension, unspecified: Secondary | ICD-10-CM | POA: Insufficient documentation

## 2023-08-28 DIAGNOSIS — Z952 Presence of prosthetic heart valve: Secondary | ICD-10-CM | POA: Diagnosis not present

## 2023-08-28 DIAGNOSIS — I5022 Chronic systolic (congestive) heart failure: Secondary | ICD-10-CM | POA: Diagnosis not present

## 2023-08-28 LAB — BASIC METABOLIC PANEL WITH GFR
Anion gap: 9 (ref 5–15)
BUN: 13 mg/dL (ref 6–20)
CO2: 26 mmol/L (ref 22–32)
Calcium: 9.6 mg/dL (ref 8.9–10.3)
Chloride: 105 mmol/L (ref 98–111)
Creatinine, Ser: 0.93 mg/dL (ref 0.44–1.00)
GFR, Estimated: >60 mL/min (ref >60)
Glucose, Bld: 106 mg/dL — ABNORMAL HIGH (ref 70–99)
Potassium: 4.7 mmol/L (ref 3.5–5.1)
Sodium: 140 mmol/L (ref 135–145)

## 2023-08-28 LAB — LIPID PANEL
Cholesterol: 113 mg/dL (ref 0–200)
HDL: 45 mg/dL (ref >40)
LDL Cholesterol: 54 mg/dL (ref 0–99)
Total CHOL/HDL Ratio: 2.5 ratio
Triglycerides: 70 mg/dL (ref <150)
VLDL: 14 mg/dL (ref 0–40)

## 2023-08-28 LAB — BRAIN NATRIURETIC PEPTIDE: B Natriuretic Peptide: 43.5 pg/mL (ref 0.0–100.0)

## 2023-08-28 MED ORDER — EZETIMIBE 10 MG PO TABS
10.0000 mg | ORAL_TABLET | Freq: Every day | ORAL | 3 refills | Status: AC
Start: 2023-08-28 — End: ?
  Filled 2023-08-28 – 2023-10-16 (×2): qty 90, 90d supply, fill #0

## 2023-08-28 MED ORDER — CARVEDILOL 12.5 MG PO TABS
12.5000 mg | ORAL_TABLET | Freq: Two times a day (BID) | ORAL | 3 refills | Status: DC
  Filled 2023-08-28 – 2023-09-13 (×2): qty 90, 45d supply, fill #0

## 2023-08-28 NOTE — Patient Instructions (Addendum)
 Good to see you today!  Labs done today, your results will be available in MyChart, we will contact you for abnormal readings.  Your physician has requested that you have an echocardiogram. Echocardiography is a painless test that uses sound waves to create images of your heart. It provides your doctor with information about the size and shape of your heart and how well your heart's chambers and valves are working. This procedure takes approximately one hour. There are no restrictions for this procedure. Please do NOT wear cologne, perfume, aftershave, or lotions (deodorant is allowed). Please arrive 15 minutes prior to your appointment time.  Please note: We ask at that you not bring children with you during ultrasound (echo/ vascular) testing. Due to room size and safety concerns, children are not allowed in the ultrasound rooms during exams. Our front office staff cannot provide observation of children in our lobby area while testing is being conducted. An adult accompanying a patient to their appointment will only be allowed in the ultrasound room at the discretion of the ultrasound technician under special circumstances. We apologize for any inconvenience.  Your physician recommends that you schedule a follow-up appointment in: 4 months w app  as scheduled  If you have any questions or concerns before your next appointment please send us  a message through [facility] or call our office at [phone].    TO LEAVE A MESSAGE FOR THE NURSE SELECT OPTION 2, PLEASE LEAVE A MESSAGE INCLUDING: YOUR NAME DATE OF BIRTH CALL BACK NUMBER REASON FOR CALL**this is important as we prioritize the call backs  YOU WILL RECEIVE A CALL BACK THE SAME DAY AS LONG AS YOU CALL BEFORE 4:00 PM .hf

## 2023-08-28 NOTE — Progress Notes (Signed)
 Advanced Heart Failure Clinic Note   PCP: Delbert Clam, MD HF Cardiologist: Dr. Rolan  Reason for Visit: CHF/aortic valve disorder  Leslie Walker is a 58 y.o. with a history of HTN, hyperlipidemia, stroke cypotogenic 2020/LINQ placement, CAD, polysubstance abuse heroin/cocaine abuse, and chronic HFrEF.    Admitted 09/2020 with NSTEMI. CT negative for PE but did show multivessel coronary calcium . Echo showed EF 45-50%, RV normal, moderate aortic valve stenosis. Had cath with overlapping DES to mid RCA, also with 70-80% left circumflex, left main patent, mid LAD 55%. Plan for DAPT with Aspirin  + ticagrelor   x 12 months.     Admitted 11/23 with CHF.  3 months PTA she had run out of all medications. Says she didn't call for refills.   Last used cocaine 7 days prior to admission and heroin about 2 weeks prior. She was diuresed with IV lasix  and GDMT titrated. She was enrolled in paramedicine program. Echo with EF 20-25%, RV okay, moderate to severe AI with possible mobile mass. TEE showed EF 20%, mildly reduced RV, bicuspid aortic valve with moderate to severe AI.  R/LHC in 11/23 showed 75% mid LCx stenosis, elevated PCWP and LVEDP but normal RV pressure, moderate pulmonary venous hypertension, CI low by thermo but preserved by Fick.  cMRI in 11/23 showed LVEF 18%, RVEF 48%, bicuspid aortic valve with severe AI, mid-wall LGE in inferolateral wall could be due to prior myocarditis but patient has CAD in LCx chronically.  Structural heart team reviewed her studies, not felt to be a candidate for TAVR.  Echo 3/24: EF 50-55%, normal RV, bicuspid aortic valve with moderate AS and probably moderate AI.  TEE (4/24) showed LVEF 55%, normal RV, bicuspid AoV with fused noncoronary and right coronary cusps, mild-moderate AS with mean gradient 19 and AVA 1.57 cm^2, moderate to severe AI.  Echo 1/25: EF 55-60%, no RWMA, RV normal, small pericardial effusion present, trivial MR, bicuspic AV, AV regurgitation  mod-severe. Mild-mod AV stenosis. AV mean gradient 11 mmHg  [facility] 3/25 showed normal filling and PA pressures, preserved CI and 80% stenosis in the mid LCx.   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 Post op course was relatively unremarkable.    Echo in 5/25 showed EF 70-75%, moderate LVH, mild RV dysfunction, mild mitral regurgitation, bioprosthetic aortic valve with mean gradient 17 mmHg in setting of hyperdynamic LV, IVC normal, moderate pericardial effusion.   She presents today for followup of CHF and aortic valve replacement. She is doing cardiac rehab. Energy level is better since AVR, no exertional dyspnea or chest pain.  No orthopnea/PND.  No lightheadedness or palpitations. She is not smoking.   ECG (personally reviewed): NSR, biatrial enlargement.   Labs (1/24): K 4.2, creatinine 1.08 Labs (3/24): K 4.3, creatinine 1.03, LDL 55 Labs (7/24): K 4.3, creatinine 0.97 Labs (4/25): K 4.1, creatinine 0.95  PMH: 1. Hyperlipidemia 2. CVA: 2020, cryptogenic.  LINQ monitor placed.  3. Polysubstance abuse: Cocaine, heroin.  4. CAD: NSTEMI 9/22 with DES to mid RCA.  - LHC (11/23): 75% mLCx, 50% pLAD.  5. Bicuspid aortic valve disorder:  - CTA chest 9/22 with no thoracic aortic aneurysm.  - TEE (11/23): Moderate-severe AI - Echo (3/24): Moderate AS with mean gradient 18/AVA 1.04 cm^2; moderate AI.  - Echo 1/25: EF 55-60%, no RWMA, RV normal, small pericardial effusion present, trivial MR, bicuspic AV, AV regurgitation mod-severe. Mild-mod AV stenosis. AV mean gradient 11 mmHg - s/p AoV replacement 4/25 - Stable bioprosthetic AoV on echo  5/25.  6. Chronic systolic CHF: Nonischemic cardiomyopathy.  - RHC (11/23): mean RA 6, PA 51/29 mean 39, mean PCWP 32, CI 2.4 Fick with PVR 1.7 WU - cMRI (11/23): Moderate LV dilation with EF 18%, RV EF 48%, bicuspid aortic valve with severe AI, non-coronary LGE pattern with mid-wall LGE in the inferolateral wall (?prior myocarditis) though  patient does have CAD in LCx.   - Echo (3/24): EF 50-55%, normal RV, bicuspid aortic valve with moderate AS and probably moderate AI.  - TEE (4/24): LVEF 55%, normal RV, no LAA thrombus, PFO or ASD, bicuspid AoV withfused noncoronary and right coronary cusps, mild-moderate AS with mean gradient 19 and AVA 1.57 cm^2, moderate to severe AI. - Echo (1/25): EF 55-60%, no RWMA, RV normal, small pericardial effusion present, trivial MR, bicuspic AV, AV regurgitation mod-severe. Mild-mod AV stenosis. AV mean gradient 11 mmHg - R/LHC (2/25): 80% stenosis in mid Cx; RA 5, PA 30/9 (18), PCWP 12, CO/CI (Fick) 4.98/2.99 - Echo (5/25): EF 70-75%, moderate LVH, mild RV dysfunction, mild mitral regurgitation, bioprosthetic aortic valve with mean gradient 17 mmHg in setting of hyperdynamic LV, IVC normal, moderate pericardial effusion.   ROS: All systems negative except as listed in HPI, PMH and Problem List.  Social History   Socioeconomic History   Marital status: Single    Spouse name: Not on file   Number of children: 3   Years of education: Not on file   Highest education level: 12th grade  Occupational History   Not on file  Tobacco Use   Smoking status: Some Days    Current packs/day: 1.00    Types: Cigars, Cigarettes   Smokeless tobacco: Never   Tobacco comments:    A cigarette or black n mild once in a while  Vaping Use   Vaping status: Never Used  Substance and Sexual Activity   Alcohol use: Not Currently    Comment: social drinking   Drug use: Not Currently    Types: Cocaine, Marijuana    Comment: no use in about 1 year (04/26/23)   Sexual activity: Not Currently    Birth control/protection: Post-menopausal  Other Topics Concern   Not on file  Social History Narrative   Not on file   Social Drivers of Health   Financial Resource Strain: High Risk (12/14/2021)   Overall Financial Resource Strain (CARDIA)    Difficulty of Paying Living Expenses: Hard  Food Insecurity: No Food  Insecurity (05/09/2023)   Hunger Vital Sign    Worried About Running Out of Food in the Last Year: Never true    Ran Out of Food in the Last Year: Never true  Transportation Needs: No Transportation Needs (05/09/2023)   PRAPARE - Administrator, Civil Service (Medical): No    Lack of Transportation (Non-Medical): No  Physical Activity: Inactive (02/21/2017)   Exercise Vital Sign    Days of Exercise per Week: 0 days    Minutes of Exercise per Session: 0 min  Stress: No Stress Concern Present (02/21/2017)   [employer] of Occupational Health - Occupational Stress Questionnaire    Feeling of Stress : Not at all  Social Connections: Unknown (05/09/2023)   Social Connection and Isolation Panel    Frequency of Communication with Friends and Family: Twice a week    Frequency of Social Gatherings with Friends and Family: Twice a week    Attends Religious Services: Not on Marketing executive or Organizations: Not  on file    Attends Club or Organization Meetings: Not on file    Marital Status: Not on file  Intimate Partner Violence: Not At Risk (05/09/2023)   Humiliation, Afraid, Rape, and Kick questionnaire    Fear of Current or Ex-Partner: No    Emotionally Abused: No    Physically Abused: No    Sexually Abused: No   FH:  Family History  Problem Relation Age of Onset   Hypertension Mother    Hypertension Father    Hypertension Brother    Stroke Sister    Past Medical History:  Diagnosis Date   Chronic systolic heart failure (HCC)    Cocaine abuse (HCC)    last use about 1 year ago (04/26/23)   Coronary artery disease    Dyslipidemia    Hypertension    Noncompliance w/medication treatment due to intermit use of medication    Stroke (HCC) 2021   no deficits   Current Outpatient Medications  Medication Sig Dispense Refill   aspirin  EC 81 MG tablet Take 1 tablet (81 mg total) by mouth daily. Swallow whole.     atorvastatin  (LIPITOR) 40 MG tablet Take 1  tablet (40 mg total) by mouth daily at 6 PM. 90 tablet 3   clopidogrel  (PLAVIX ) 75 MG tablet Take 1 tablet (75 mg total) by mouth daily. 90 tablet 3   empagliflozin  (JARDIANCE ) 10 MG TABS tablet Take 1 tablet (10 mg total) by mouth daily. 30 tablet 3   Evolocumab  (REPATHA  SURECLICK) 140 MG/ML SOAJ Inject 140 mg into the skin every 14 (fourteen) days. 6 mL 3   furosemide  (LASIX ) 20 MG tablet Take 1 tablet (20 mg total) by mouth as needed for fluid or edema. For weight gain of 3lb in 24 hours or 5lb in a week. 8 tablet 4   hydrOXYzine  (ATARAX ) 25 MG tablet Take 1 tablet (25 mg total) by mouth at bedtime as needed. 30 tablet 1   losartan  (COZAAR ) 25 MG tablet Take 1 tablet (25 mg total) by mouth daily. 90 tablet 3   spironolactone  (ALDACTONE ) 25 MG tablet Take 0.5 tablets (12.5 mg total) by mouth daily. 30 tablet 1   carvedilol  (COREG ) 12.5 MG tablet Take 1 tablet (12.5 mg total) by mouth 2 (two) times daily with a meal. 90 tablet 3   ezetimibe  (ZETIA ) 10 MG tablet Take 1 tablet (10 mg total) by mouth daily. 90 tablet 3   No current facility-administered medications for this encounter.   BP 120/88   Pulse 86   Wt 65.8 kg (145 lb)   LMP  (LMP Unknown) Comment: Pt unsure why she no longer gets her period  SpO2 100%   BMI 24.89 kg/m   Wt Readings from Last 3 Encounters:  08/28/23 65.8 kg (145 lb)  07/27/23 68.6 kg (151 lb 3.8 oz)  07/20/23 66.5 kg (146 lb 9.6 oz)   PHYSICAL EXAM: General: NAD Neck: No JVD, no thyromegaly or thyroid nodule.  Lungs: Clear to auscultation bilaterally with normal respiratory effort. CV: Nondisplaced PMI.  Heart regular S1/S2, no S3/S4, 1/6 SEM RUSB.  No peripheral edema.  No carotid bruit.  Normal pedal pulses.  Abdomen: Soft, nontender, no hepatosplenomegaly, no distention.  Skin: Intact without lesions or rashes.  Neurologic: Alert and oriented x 3.  Psych: Normal affect. Extremities: No clubbing or cyanosis.  HEENT: Normal.   ASSESSMENT & PLAN:  1.  Chronic systolic CHF: Nonischemic CMP.  Echo 11/23 with EF 20-25%, global hypokinesis, moderate LV dilation,  normal RV, mod-severe AI.  RHC showed significantly elevated PCWP and LVEDP but normal RA pressure; CI 2.4 Fick/2.0 thermo.  Cause of cardiomyopathy uncertain, CAD does not appear to have progressed (75% mLCx stenosis on 11/23 cath).  Possibly some component of substance abuse (cocaine) though by cardiac MRI in 11/23, prior myocarditis is also a concern (non-coronary LGE noted).  LV EF 18% on cMRI with relatively preserved RV function.  Aortic insufficiency also likely plays a role (moderate to severe on TEE in 11/23).  Repeat echo 3/24 showed improvement with EF 50-55%, normal RV, bicuspid aortic valve with moderate AS and moderate AI. Echo 1/25 EF 55-60%, RV normal, moderate/severe AI. Improvement in EF suggests a role for cocaine in her cardiomyopathy (she had quit using drugs).  Now s/p AVR 4/25. Echo 5/25 EF 70-75%, moderate LVH, mild RV dysfunction, mild mitral regurgitation, bioprosthetic aortic valve with mean gradient 17 mmHg in setting of hyperdynamic LV, IVC normal, moderate pericardial effusion.  She is not volume overloaded on exam, NYHA class I-II.  - Off Entresto , did not tolerate w/ hypotension. Would hold off retrying it now that EF is in normal range.  - Continue losartan  25 mg daily.   - Continue Coreg  12.5 mg bid.      - Continue Jardiance  10 mg daily  - Continue spironolactone  12.5 mg daily.  BMET/BNP today.  - She does not appear to need Lasix .  - Given moderate pericardial effusion noted on last echo (?post-pericardiotomy syndrome-related), will get repeat limited echo to make sure this has decreased/resolved.   2. CAD: DES to RCA in 9/22.  Cath in 11/23 with patent RCA stent, 50% proximal LAD, 75% mid LCx.  The LCx stenosis appeared similar to the prior cath, no intervention on LCx . Lp(a) markedly elevated with age-advanced CAD. Stable w/o CP  - Continue ASA 81 and Plavix  75  mg daily  - Continue atorvastatin  40 mg daily  - Continue Repatha  - Continue Zetia  - Check lipids today.  3. Aortic valve disorder: Moderate-severe AI on 11/23 echo and TEE. TEE showed bicuspid aortic valve with fusion of right and noncoronary cusps.  Moderate-severe AI by TEE.  By cardiac MRI, aortic valve regurgitant fraction was 38% which suggests severe AI.  She was deemed not to be a TAVR candidate, but with improvement in LV function, she should be SAVR candidate. TEE in 4/24 showed mild-moderate AS with mean gradient 19 and AVA 1.57 cm^2, moderate to severe AI. Echo 1/25 showed bicuspid AoV, aortic regurgitation mod-severe. Mild-mod AoV stenosis, AoV mean gradient 11 mmHg. Now s/p bioprosthetic aortic valve replacement 04/2023. Echo 5/25 w/ stable prosthesis, mean gradient 17 mmHg (mild elevation likely due to hyperdynamic LV function).  - Recommended screening in 1st degree relatives for bicuspid aortic valve - Continue cardiac rehab.  4. Polysubstance abuse: Cocaine/heroin. She reports abstinence and EF has improved.  - Congratulated 5. H/o CVA: She is on Plavix . 6. Smoking: Quit since heart surgery.   Followup in 4 months with APP.   I spent 32 minutes reviewing records, interviewing/examining patient, and managing orders.    Leslie Walker   08/28/2023

## 2023-08-30 ENCOUNTER — Encounter (HOSPITAL_COMMUNITY)
Admission: RE | Admit: 2023-08-30 | Discharge: 2023-08-30 | Disposition: A | Discharge status: Home / Self Care | Source: Ambulatory Visit | Attending: Cardiology | Admitting: Cardiology

## 2023-08-30 DIAGNOSIS — Z952 Presence of prosthetic heart valve: Secondary | ICD-10-CM

## 2023-09-01 ENCOUNTER — Other Ambulatory Visit: Payer: Self-pay

## 2023-09-01 ENCOUNTER — Encounter (HOSPITAL_COMMUNITY)

## 2023-09-04 ENCOUNTER — Encounter (HOSPITAL_COMMUNITY)
Admission: RE | Admit: 2023-09-04 | Discharge: 2023-09-04 | Disposition: A | Discharge status: Home / Self Care | Source: Ambulatory Visit | Attending: Cardiology

## 2023-09-04 VITALS — Ht 64.0 in | Wt 146.2 lb

## 2023-09-04 DIAGNOSIS — Z952 Presence of prosthetic heart valve: Secondary | ICD-10-CM

## 2023-09-04 NOTE — Progress Notes (Signed)
 Discharge Progress Report  Patient Details  Name: Leslie Walker MRN: [id number] Date of Birth: 1965-12-07 Referring Provider:   Flowsheet Row CARDIAC REHAB PHASE II ORIENTATION from 07/27/2023 in [facility]  Referring Provider Leslie Shuck, MD     Number of Visits: 31  Reason for Discharge:  Patient reached a stable level of exercise. Patient independent in their exercise. Patient has met program and personal goals.  Smoking History:  Social History   Tobacco Use  Smoking Status Some Days   Current packs/day: 1.00   Types: Cigars, Cigarettes  Smokeless Tobacco Never  Tobacco Comments   A cigarette or black n mild once in a while    Diagnosis:  04/28/23 S/P aortic valve replacement  ADL UCSD:   Initial Exercise Prescription:  Initial Exercise Prescription - 07/27/23 1100       Date of Initial Exercise RX and Referring Provider   Date 07/27/23    Referring Provider Leslie Shuck, MD    Expected Discharge Date 09/06/23      Recumbant Elliptical   Level 1    RPM 55    Watts 70    Minutes 15    METs 2.5      Track   Laps 17    Minutes 15    METs 2.3      Prescription Details   Frequency (times per week) 3    Duration Progress to 30 minutes of continuous aerobic without signs/symptoms of physical distress      Intensity   THRR 40-80% of Max Heartrate 65-130    Ratings of Perceived Exertion 11-13    Perceived Dyspnea 0-4      Progression   Progression Continue progressive overload as per policy without signs/symptoms or physical distress.      Resistance Training   Training Prescription Yes    Weight 2    Reps 10-15          Discharge Exercise Prescription (Final Exercise Prescription Changes):  Exercise Prescription Changes - 09/06/23 1644       Response to Exercise   Blood Pressure (Admit) 120/72    Blood Pressure (Exit) 100/64    Heart Rate (Admit) 72 bpm    Heart Rate  (Exercise) 106 bpm    Heart Rate (Exit) 75 bpm    Rating of Perceived Exertion (Exercise) 9    Perceived Dyspnea (Exercise) 0    Symptoms None    Comments Pt graduated teh Pritikin program    Duration Continue with 30 min of aerobic exercise without signs/symptoms of physical distress.    Intensity THRR unchanged      Progression   Progression Continue to progress workloads to maintain intensity without signs/symptoms of physical distress.    Average METs 3.17      Resistance Training   Training Prescription Yes    Weight 2    Reps 10-15    Time 10 Minutes      Interval Training   Interval Training No      Track   Laps 17    Minutes 15    METs 3.17   other 15 mins spent doing homework     Home Exercise Plan   Plans to continue exercise at Home (comment)    Frequency Add 2 additional days to program exercise sessions.    Initial Home Exercises Provided 08/25/23          Functional Capacity:  6 Minute Walk  Row Name 07/27/23 1412 09/04/23 1718       6 Minute Walk   Phase Initial Discharge    Distance 1495 feet 1760 feet    Distance % Change -- 17.73 %    Distance Feet Change -- 265 ft    Walk Time 6 minutes 6 minutes    # of Rest Breaks 0 0    MPH 2.83 3.33    METS 4.04 4.42    RPE 9 11    Perceived Dyspnea  0 0    VO2 Peak 14.14 15.48    Symptoms No No    Resting HR 59 bpm 84 bpm    Resting BP 132/80 142/84    Resting Oxygen Saturation  100 % --    Exercise Oxygen Saturation  during 6 min walk 100 % --    Max Ex. HR 86 bpm 105 bpm    Max Ex. BP 168/90 124/86    2 Minute Post BP 154/84 118/80       Psychological, QOL, Others - Outcomes: PHQ 2/9:    09/04/2023    4:11 PM 07/27/2023   11:24 AM 05/09/2023    1:47 PM 12/23/2021    2:18 PM  Depression screen PHQ 2/9  Decreased Interest 0 0 0 1  Down, Depressed, Hopeless 0 0 0 0  PHQ - 2 Score 0 0 0 1  Altered sleeping 0 0 0 0  Tired, decreased energy 0 1 0 1  Change in appetite 0 0 2 0  Feeling  bad or failure about yourself  0 0 0 0  Trouble concentrating 0 0 0 0  Moving slowly or fidgety/restless 0 0 1 0  Suicidal thoughts 0 0 0 0  PHQ-9 Score 0 1 3 2   Difficult doing work/chores Not difficult at all Not difficult at all  Not difficult at all    Quality of Life:  Quality of Life - 09/06/23 1635       Quality of Life   Select Quality of Life      Quality of Life Scores   Health/Function Post 23 %    Socioeconomic Post 21.44 %    Psych/Spiritual Post 26.86 %    Family Post 26.1 %    GLOBAL Post 23.88 %          Personal Goals: Goals established at orientation with interventions provided to work toward goal.  Personal Goals and Risk Factors at Admission - 07/27/23 1126       Core Components/Risk Factors/Patient Goals on Admission    Weight Management Yes;Weight Maintenance    Intervention Weight Management: Develop a combined nutrition and exercise program designed to reach desired caloric intake, while maintaining appropriate intake of nutrient and fiber, sodium and fats, and appropriate energy expenditure required for Leslie weight goal.;Weight Management: Provide education and appropriate resources to help participant work on and attain dietary goals.    Expected Outcomes Short Term: Continue to assess and modify interventions until short term weight is achieved;Long Term: Adherence to nutrition and physical activity/exercise program aimed toward attainment of established weight goal;Understanding recommendations for meals to include 15-35% energy as protein, 25-35% energy from fat, 35-60% energy from carbohydrates, less than 200mg  of dietary cholesterol, 20-35 gm of total fiber daily;Understanding of distribution of calorie intake throughout Leslie day with Leslie consumption of 4-5 meals/snacks    Tobacco Cessation Yes    Number of packs per day 1    Intervention Assist Leslie participant in steps to  quit. Provide individualized education and counseling about committing to  Tobacco Cessation, relapse prevention, and pharmacological support that can be provided by physician.;Education officer, environmental, assist with locating and accessing local/national Quit Smoking programs, and support quit date choice.    Expected Outcomes Short Term: Will demonstrate readiness to quit, by selecting a quit date.;Long Term: Complete abstinence from all tobacco products for at least 12 months from quit date.;Short Term: Will quit all tobacco product use, adhering to prevention of relapse plan.    Heart Failure Yes    Intervention Provide a combined exercise and nutrition program that is supplemented with education, support and counseling about heart failure. Directed toward relieving symptoms such as shortness of breath, decreased exercise tolerance, and extremity edema.    Expected Outcomes Improve functional capacity of life;Short term: Attendance in program 2-3 days a week with increased exercise capacity. Reported lower sodium intake. Reported increased fruit and vegetable intake. Reports medication compliance.;Short term: Daily weights obtained and reported for increase. Utilizing diuretic protocols set by physician.;Long term: Adoption of self-care skills and reduction of barriers for early signs and symptoms recognition and intervention leading to self-care maintenance.    Hypertension Yes    Intervention Provide education on lifestyle modifcations including regular physical activity/exercise, weight management, moderate sodium restriction and increased consumption of fresh fruit, vegetables, and low fat dairy, alcohol moderation, and smoking cessation.;Monitor prescription use compliance.    Expected Outcomes Long Term: Maintenance of blood pressure at goal levels.;Short Term: Continued assessment and intervention until BP is < 140/18mm HG in hypertensive participants. < 130/31mm HG in hypertensive participants with diabetes, heart failure or chronic kidney disease.    Lipids Yes     Intervention Provide education and support for participant on nutrition & aerobic/resistive exercise along with prescribed medications to achieve LDL 70mg , HDL >40mg .    Expected Outcomes Short Term: Participant states understanding of desired cholesterol values and is compliant with medications prescribed. Participant is following exercise prescription and nutrition guidelines.;Long Term: Cholesterol controlled with medications as prescribed, with individualized exercise RX and with personalized nutrition plan. Value goals: LDL < 70mg , HDL > 40 mg.           Personal Goals Discharge:  Goals and Risk Factor Review     Row Name 08/01/23 1625 08/25/23 1446 09/19/23 1407         Core Components/Risk Factors/Patient Goals Review   Personal Goals Review Weight Management/Obesity;Heart Failure;Hypertension;Lipids Weight Management/Obesity;Heart Failure;Hypertension;Lipids Weight Management/Obesity;Heart Failure;Hypertension;Lipids     Review Leslie Walker started cardiac rehab on 08/01/23. Leslie Walker did well with exercise. Vital signs were stable. Leslie Walker started cardiac rehab on 08/01/23. Leslie Walker continues to do  well with exercise. Vital signs have been stable. Leslie Walker completed cardiac rehab on 09/04/23     Expected Outcomes Leslie Walker will continue to participate in cardiac rehab for exercise, nutrition and lifestyle modificaitons Leslie Walker will continue to participate in cardiac rehab for exercise, nutrition and lifestyle modificaitons Leslie Walker will continue to exercise,  follow nutrition and lifestyle modificaitons upon completion of cardiac rehab        Exercise Goals and Review:  Exercise Goals     Row Name 07/27/23 1026             Exercise Goals   Increase Physical Activity Yes       Intervention Provide advice, education, support and counseling about physical activity/exercise needs.;Develop an individualized exercise prescription for aerobic and resistive training based on initial evaluation  findings, risk stratification, comorbidities and participant's personal goals.  Expected Outcomes Short Term: Attend rehab on a regular basis to increase amount of physical activity.;Long Term: Exercising regularly at least 3-5 days a week.;Long Term: Add in home exercise to make exercise part of routine and to increase amount of physical activity.       Increase Strength and Stamina Yes       Intervention Provide advice, education, support and counseling about physical activity/exercise needs.;Develop an individualized exercise prescription for aerobic and resistive training based on initial evaluation findings, risk stratification, comorbidities and participant's personal goals.       Expected Outcomes Short Term: Increase workloads from initial exercise prescription for resistance, speed, and METs.;Short Term: Perform resistance training exercises routinely during rehab and add in resistance training at home;Long Term: Improve cardiorespiratory fitness, muscular endurance and strength as measured by increased METs and functional capacity ( )       Able to understand and use rate of perceived exertion (RPE) scale Yes       Intervention Provide education and explanation on how to use RPE scale       Expected Outcomes Short Term: Able to use RPE daily in rehab to express subjective intensity level;Long Term:  Able to use RPE to guide intensity level when exercising independently       Knowledge and understanding of Target Heart Rate Range (THRR) Yes       Intervention Provide education and explanation of THRR including how Leslie numbers were predicted and where they are located for reference       Expected Outcomes Short Term: Able to state/look up THRR;Long Term: Able to use THRR to govern intensity when exercising independently;Short Term: Able to use daily as guideline for intensity in rehab       Understanding of Exercise Prescription Yes       Intervention Provide education, explanation, and  written materials on patient's individual exercise prescription       Expected Outcomes Short Term: Able to explain program exercise prescription;Long Term: Able to explain home exercise prescription to exercise independently          Exercise Goals Re-Evaluation:  Exercise Goals Re-Evaluation     Row Name 07/31/23 1639 08/25/23 1700 09/06/23 1640         Exercise Goal Re-Evaluation   Exercise Goals Review Increase Physical Activity;Understanding of Exercise Prescription;Increase Strength and Stamina;Knowledge and understanding of Target Heart Rate Range (THRR);Able to understand and use rate of perceived exertion (RPE) scale Increase Physical Activity;Understanding of Exercise Prescription;Increase Strength and Stamina;Knowledge and understanding of Target Heart Rate Range (THRR);Able to understand and use rate of perceived exertion (RPE) scale Increase Physical Activity;Understanding of Exercise Prescription;Increase Strength and Stamina;Knowledge and understanding of Target Heart Rate Range (THRR);Able to understand and use rate of perceived exertion (RPE) scale     Comments Pt first day in [facility]2 program. Pt is learning her THRR, ExRx, and RPE scale. Leslie Walker tolerated exercise well with no s/sx or pain and an avg MET level of 2.5. Reviewed MET's, goals and home ExRx. Pt tolerated exercise well with an average MET level of 5.47. We were talking and lost track of time. Stayed on Leslie track 30 mins. She will exercise by adding 1-2 days walking with her grandson for 30-60 mins. She feels good about her goals and in already feeling an increase in strength and energy. She says she feels like getting up and going. Patient graduated Leslie Leslie Walker. Pt tolerated exercise well with an average MET level of 3.85. She did  well and increased on her post walk test by 271ft for a total of 1742ft. She feels much better. She will walk for exercise about 4 days a week for 30-45 mins per session     Expected  Outcomes Will continue to progress workloads as tolerated without s/sx. Will continue to progress workloads as tolerated without s/sx. Will continue to progress workloads as tolerated without s/sx.        Nutrition & Weight - Outcomes:  Pre Biometrics - 07/27/23 1025       Pre Biometrics   Waist Circumference 36 inches    Hip Circumference 41 inches    Waist to Hip Ratio 0.88 %    Triceps Skinfold 15 mm    % Body Fat 34.3 %    Grip Strength 26 kg    Flexibility 14.75 in    Single Leg Stand 4.6 seconds          Post Biometrics - 09/04/23 1719        Post  Biometrics   Height 5' 4 (1.626 m)    Weight 66.3 kg    Waist Circumference 35 inches    Hip Circumference 38.5 inches    Waist to Hip Ratio 0.91 %    BMI (Calculated) 25.08    Triceps Skinfold 20 mm    % Body Fat 35.2 %    Grip Strength 25 kg    Flexibility 15.5 in    Single Leg Stand 5 seconds          Nutrition:  Nutrition Therapy & Goals - 08/28/23 1517       Nutrition Therapy   Diet Heart Healthy Diet    Drug/Food Interactions Statins/Certain Fruits      Personal Nutrition Goals   Nutrition Goal Patient to identify strategies for reducing cardiovascular risk by attending Leslie Pritikin education and nutrition series weekly.   goal in progress   Personal Goal #2 Patient to improve diet quality by using Leslie plate method as a guide for meal planning to include lean protein/plant protein, fruits, vegetables, whole grains, nonfat dairy as part of a well-balanced diet.   goal in progress.   Comments Goal in progress. Patient has medical history of HTN, hyperlipidemia, stroke cypotogenic 2020/LINQ placement, CAD, polysubstance abuse heroin/cocaine abuse, and chronic HFrEF, NSTEMI. She continues to attend Leslie Pritikin education and nutrition series regularly. LDL has improved to goal; she continues zetia , repatha , lipitor. She is down 1.5# since starting with our program. Patient will benefit from participation in  intensive cardiac rehab for nutrition education, exercise, and lifestyle modification.      Intervention Plan   Intervention Prescribe, educate and counsel regarding individualized specific dietary modifications aiming towards targeted core components such as weight, hypertension, lipid management, diabetes, heart failure and other comorbidities.;Nutrition handout(s) given to patient.    Expected Outcomes Short Term Goal: Understand basic principles of dietary content, such as calories, fat, sodium, cholesterol and nutrients.;Long Term Goal: Adherence to prescribed nutrition plan.          Nutrition Discharge:  Nutrition Assessments - 09/06/23 1638       Rate Your Plate Scores   Post Score 74          Education Questionnaire Score:  Knowledge Questionnaire Score - 09/06/23 1631       Knowledge Questionnaire Score   Post Score 15/28          Goals reviewed with patient; copy given to patient.Pt graduates from  Intensive/Traditional cardiac rehab  program on 09/04/23  with completion of 31  exercise and education sessions. Pt maintained good attendance and progressed nicely during their participation in rehab as evidenced by increased MET level. Shamariah increased her distance on her post exercise walk test by 265 feet and lost 1.2 kg while enrolled in Leslie program.   Medication list reconciled. Repeat  PHQ score-  0.  Pt has made significant lifestyle changes and should be commended for their success. Cloee achieved her  goals during cardiac rehab.   Pt plans to continue exercise walking. We are proud of Lameisha's progress.Hadassah Elpidio Quan RN BSN

## 2023-09-06 ENCOUNTER — Other Ambulatory Visit: Payer: Self-pay

## 2023-09-06 ENCOUNTER — Encounter (HOSPITAL_COMMUNITY)
Admission: RE | Admit: 2023-09-06 | Discharge: 2023-09-06 | Disposition: A | Discharge status: Home / Self Care | Source: Ambulatory Visit | Attending: Cardiology

## 2023-09-06 DIAGNOSIS — Z952 Presence of prosthetic heart valve: Secondary | ICD-10-CM | POA: Diagnosis not present

## 2023-09-08 ENCOUNTER — Inpatient Hospital Stay (HOSPITAL_COMMUNITY): Admission: RE | Admit: 2023-09-08 | Source: Ambulatory Visit

## 2023-09-11 ENCOUNTER — Ambulatory Visit (HOSPITAL_COMMUNITY)

## 2023-09-13 ENCOUNTER — Ambulatory Visit (HOSPITAL_COMMUNITY)

## 2023-09-13 ENCOUNTER — Other Ambulatory Visit: Payer: Self-pay

## 2023-09-15 ENCOUNTER — Ambulatory Visit (HOSPITAL_COMMUNITY)

## 2023-09-20 ENCOUNTER — Ambulatory Visit (HOSPITAL_COMMUNITY)

## 2023-09-22 ENCOUNTER — Ambulatory Visit (HOSPITAL_COMMUNITY)

## 2023-09-25 ENCOUNTER — Ambulatory Visit (HOSPITAL_COMMUNITY)

## 2023-09-27 ENCOUNTER — Ambulatory Visit (HOSPITAL_COMMUNITY)

## 2023-09-28 ENCOUNTER — Encounter (HOSPITAL_COMMUNITY): Admitting: Cardiology

## 2023-09-29 ENCOUNTER — Ambulatory Visit (HOSPITAL_COMMUNITY)
Admission: RE | Admit: 2023-09-29 | Discharge: 2023-09-29 | Disposition: A | Discharge status: Home / Self Care | Source: Ambulatory Visit | Attending: Family Medicine | Admitting: Family Medicine

## 2023-09-29 ENCOUNTER — Ambulatory Visit (HOSPITAL_COMMUNITY)

## 2023-09-29 DIAGNOSIS — I2510 Atherosclerotic heart disease of native coronary artery without angina pectoris: Secondary | ICD-10-CM | POA: Diagnosis not present

## 2023-09-29 DIAGNOSIS — I080 Rheumatic disorders of both mitral and aortic valves: Secondary | ICD-10-CM | POA: Diagnosis not present

## 2023-09-29 DIAGNOSIS — I5022 Chronic systolic (congestive) heart failure: Secondary | ICD-10-CM | POA: Insufficient documentation

## 2023-09-29 LAB — ECHOCARDIOGRAM LIMITED
AR max vel: 0.81 cm2
AV Area VTI: 0.84 cm2
AV Area mean vel: 0.91 cm2
AV Mean grad: 12.5 mmHg
AV Peak grad: 24.6 mmHg
Ao pk vel: 2.48 m/s
Area-P 1/2: 3.81 cm2
Calc EF: 65.9 %
S' Lateral: 2.6 cm
Single Plane A2C EF: 54.9 %
Single Plane A4C EF: 74.6 %

## 2023-09-29 NOTE — Progress Notes (Signed)
  Echocardiogram 2D Echocardiogram has been performed.  Norleen LELON Amour 09/29/2023, 3:41 PM

## 2023-10-02 ENCOUNTER — Ambulatory Visit (HOSPITAL_COMMUNITY)

## 2023-10-04 ENCOUNTER — Ambulatory Visit (HOSPITAL_COMMUNITY)

## 2023-10-06 ENCOUNTER — Ambulatory Visit (HOSPITAL_COMMUNITY)

## 2023-10-08 NOTE — Progress Notes (Signed)
 Patient is now discharged from [employer].  Patient has/has not met the following goals:  Yes :Patient expresses basic understanding of medications and what they are for Yes :Patient able to verbalize heart failure specific dietary/fluid restrictions Yes :Patient is aware of who to call if they have medical concerns or if they need to schedule or change appts Yes :Patient has a scale for daily weights and weighs regularly Yes :Patient able to verbalize concerning symptoms when they should call the HF clinic (weight gain ranges, etc) Yes :Patient has a PCP and has seen within the past year or has upcoming appt Yes :Patient has reliable access to getting their medications Yes :Patient has shown they are able to reorder medications reliably No :Patient has had admission in past 30 days- if yes how many? No :Patient has had admission in past 90 days- if yes how many?  Discharge Comments: Leslie Walker has demonstrated her ability to be compliant w/ her meds.  She has participated in cardiac rehab and has been able to return to work.

## 2023-10-09 ENCOUNTER — Ambulatory Visit (HOSPITAL_COMMUNITY)

## 2023-10-11 ENCOUNTER — Ambulatory Visit (HOSPITAL_COMMUNITY)

## 2023-10-13 ENCOUNTER — Ambulatory Visit (HOSPITAL_COMMUNITY)

## 2023-10-16 ENCOUNTER — Ambulatory Visit (HOSPITAL_COMMUNITY)

## 2023-10-16 ENCOUNTER — Other Ambulatory Visit: Payer: Self-pay

## 2023-10-18 ENCOUNTER — Other Ambulatory Visit: Payer: Self-pay

## 2023-10-18 ENCOUNTER — Ambulatory Visit (HOSPITAL_COMMUNITY)

## 2023-10-20 ENCOUNTER — Encounter (HOSPITAL_COMMUNITY)

## 2023-11-03 NOTE — Progress Notes (Signed)
 The patient attended a screening event on 09/14/2023 where her screening results were a BP of 134/87. Per chart review the patient has Dr. Enobong Newlin as her PCP, has Medicaid for insurance, and has a tobacco SODH need. Chart review also indicates the patient last saw Dr. Newlin on 05/11/2023. [facility] attempted to reach patient for initial f/u (10/15, 10/16, 10/17) but was unable to reach patient. Letter sent with [facility] and [facility] PCP resource flyers, [place] and [employer] and [facility] financial assistance information flyers, and tobacco resources in case needed by patient. An additional follow up will be done in according to the health equity team's protocol.

## 2023-11-28 ENCOUNTER — Encounter (HOSPITAL_COMMUNITY): Payer: Self-pay

## 2023-12-25 NOTE — Progress Notes (Incomplete)
 Advanced Heart Failure Clinic Note   PCP: Delbert Clam, MD HF Cardiologist: Dr. Rolan  Reason for Visit: CHF/aortic valve disorder  Leslie Walker is a 58 y.o. with a history of HTN, hyperlipidemia, stroke cypotogenic 2020/LINQ placement, CAD, polysubstance abuse heroin/cocaine abuse, and chronic HFrEF.    Admitted 09/2020 with NSTEMI. CT negative for PE but did show multivessel coronary calcium . Echo showed EF 45-50%, RV normal, moderate aortic valve stenosis. Had cath with overlapping DES to mid RCA, also with 70-80% left circumflex, left main patent, mid LAD 55%. Plan for DAPT with Aspirin  + ticagrelor   x 12 months.     Admitted 11/23 with CHF.  3 months PTA she had run out of all medications. Says she didn't call for refills.   Last used cocaine 7 days prior to admission and heroin about 2 weeks prior. She was diuresed with IV lasix  and GDMT titrated. She was enrolled in paramedicine program. Echo with EF 20-25%, RV okay, moderate to severe AI with possible mobile mass. TEE showed EF 20%, mildly reduced RV, bicuspid aortic valve with moderate to severe AI.  R/LHC in 11/23 showed 75% mid LCx stenosis, elevated PCWP and LVEDP but normal RV pressure, moderate pulmonary venous hypertension, CI low by thermo but preserved by Fick.  cMRI in 11/23 showed LVEF 18%, RVEF 48%, bicuspid aortic valve with severe AI, mid-wall LGE in inferolateral wall could be due to prior myocarditis but patient has CAD in LCx chronically.  Structural heart team reviewed her studies, not felt to be a candidate for TAVR.  Echo 3/24: EF 50-55%, normal RV, bicuspid aortic valve with moderate AS and probably moderate AI.  TEE (4/24) showed LVEF 55%, normal RV, bicuspid AoV with fused noncoronary and right coronary cusps, mild-moderate AS with mean gradient 19 and AVA 1.57 cm^2, moderate to severe AI.  Echo 1/25: EF 55-60%, no RWMA, RV normal, small pericardial effusion present, trivial MR, bicuspic AV, AV regurgitation  mod-severe. Mild-mod AV stenosis. AV mean gradient 11 mmHg  [facility] 3/25 showed normal filling and PA pressures, preserved CI and 80% stenosis in the mid LCx.   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 Post op course was relatively unremarkable.    Echo in 5/25 showed EF 70-75%, moderate LVH, mild RV dysfunction, mild mitral regurgitation, bioprosthetic aortic valve with mean gradient 17 mmHg in setting of hyperdynamic LV, IVC normal, moderate pericardial effusion.   She presents today for followup of CHF and aortic valve replacement. She is doing cardiac rehab. Energy level is better since AVR, no exertional dyspnea or chest pain.  No orthopnea/PND.  No lightheadedness or palpitations. She is not smoking.   ECG (personally reviewed): NSR, biatrial enlargement.   Labs (1/24): K 4.2, creatinine 1.08 Labs (3/24): K 4.3, creatinine 1.03, LDL 55 Labs (7/24): K 4.3, creatinine 0.97 Labs (4/25): K 4.1, creatinine 0.95  PMH: 1. Hyperlipidemia 2. CVA: 2020, cryptogenic.  LINQ monitor placed.  3. Polysubstance abuse: Cocaine, heroin.  4. CAD: NSTEMI 9/22 with DES to mid RCA.  - LHC (11/23): 75% mLCx, 50% pLAD.  5. Bicuspid aortic valve disorder:  - CTA chest 9/22 with no thoracic aortic aneurysm.  - TEE (11/23): Moderate-severe AI - Echo (3/24): Moderate AS with mean gradient 18/AVA 1.04 cm^2; moderate AI.  - Echo 1/25: EF 55-60%, no RWMA, RV normal, small pericardial effusion present, trivial MR, bicuspic AV, AV regurgitation mod-severe. Mild-mod AV stenosis. AV mean gradient 11 mmHg - s/p AoV replacement 4/25 - Stable bioprosthetic AoV on echo  5/25.  6. Chronic systolic CHF: Nonischemic cardiomyopathy.  - RHC (11/23): mean RA 6, PA 51/29 mean 39, mean PCWP 32, CI 2.4 Fick with PVR 1.7 WU - cMRI (11/23): Moderate LV dilation with EF 18%, RV EF 48%, bicuspid aortic valve with severe AI, non-coronary LGE pattern with mid-wall LGE in the inferolateral wall (?prior myocarditis) though  patient does have CAD in LCx.   - Echo (3/24): EF 50-55%, normal RV, bicuspid aortic valve with moderate AS and probably moderate AI.  - TEE (4/24): LVEF 55%, normal RV, no LAA thrombus, PFO or ASD, bicuspid AoV withfused noncoronary and right coronary cusps, mild-moderate AS with mean gradient 19 and AVA 1.57 cm^2, moderate to severe AI. - Echo (1/25): EF 55-60%, no RWMA, RV normal, small pericardial effusion present, trivial MR, bicuspic AV, AV regurgitation mod-severe. Mild-mod AV stenosis. AV mean gradient 11 mmHg - R/LHC (2/25): 80% stenosis in mid Cx; RA 5, PA 30/9 (18), PCWP 12, CO/CI (Fick) 4.98/2.99 - Echo (5/25): EF 70-75%, moderate LVH, mild RV dysfunction, mild mitral regurgitation, bioprosthetic aortic valve with mean gradient 17 mmHg in setting of hyperdynamic LV, IVC normal, moderate pericardial effusion.   ROS: All systems negative except as listed in HPI, PMH and Problem List.  Social History   Socioeconomic History   Marital status: Single    Spouse name: Not on file   Number of children: 3   Years of education: Not on file   Highest education level: 12th grade  Occupational History   Not on file  Tobacco Use   Smoking status: Some Days    Current packs/day: 1.00    Types: Cigars, Cigarettes   Smokeless tobacco: Never   Tobacco comments:    A cigarette or black n mild once in a while  Vaping Use   Vaping status: Never Used  Substance and Sexual Activity   Alcohol use: Not Currently    Comment: social drinking   Drug use: Not Currently    Types: Cocaine, Marijuana    Comment: no use in about 1 year (04/26/23)   Sexual activity: Not Currently    Birth control/protection: Post-menopausal  Other Topics Concern   Not on file  Social History Narrative   Not on file   Social Drivers of Health   Financial Resource Strain: High Risk (12/14/2021)   Overall Financial Resource Strain (CARDIA)    Difficulty of Paying Living Expenses: Hard  Food Insecurity: Patient  Declined (09/14/2023)   Hunger Vital Sign    Worried About Running Out of Food in the Last Year: Patient declined    Ran Out of Food in the Last Year: Patient declined  Transportation Needs: Patient Declined (09/14/2023)   PRAPARE - Administrator, Civil Service (Medical): Patient declined    Lack of Transportation (Non-Medical): Patient declined  Physical Activity: Inactive (02/21/2017)   Exercise Vital Sign    Days of Exercise per Week: 0 days    Minutes of Exercise per Session: 0 min  Stress: No Stress Concern Present (02/21/2017)   [employer] of Occupational Health - Occupational Stress Questionnaire    Feeling of Stress : Not at all  Social Connections: Unknown (05/09/2023)   Social Connection and Isolation Panel    Frequency of Communication with Friends and Family: Twice a week    Frequency of Social Gatherings with Friends and Family: Twice a week    Attends Religious Services: Not on Marketing Executive or Organizations: Not  on file    Attends Club or Organization Meetings: Not on file    Marital Status: Not on file  Intimate Partner Violence: Not At Risk (05/09/2023)   Humiliation, Afraid, Rape, and Kick questionnaire    Fear of Current or Ex-Partner: No    Emotionally Abused: No    Physically Abused: No    Sexually Abused: No   FH:  Family History  Problem Relation Age of Onset   Hypertension Mother    Hypertension Father    Hypertension Brother    Stroke Sister    Past Medical History:  Diagnosis Date   Chronic systolic heart failure (HCC)    Cocaine abuse (HCC)    last use about 1 year ago (04/26/23)   Coronary artery disease    Dyslipidemia    Hypertension    Noncompliance w/medication treatment due to intermit use of medication    Stroke (HCC) 2021   no deficits   Current Outpatient Medications  Medication Sig Dispense Refill   aspirin  EC 81 MG tablet Take 1 tablet (81 mg total) by mouth daily. Swallow whole.     atorvastatin   (LIPITOR) 40 MG tablet Take 1 tablet (40 mg total) by mouth daily at 6 PM. 90 tablet 3   carvedilol  (COREG ) 12.5 MG tablet Take 1 tablet (12.5 mg total) by mouth 2 (two) times daily with a meal. 90 tablet 3   clopidogrel  (PLAVIX ) 75 MG tablet Take 1 tablet (75 mg total) by mouth daily. 90 tablet 3   empagliflozin  (JARDIANCE ) 10 MG TABS tablet Take 1 tablet (10 mg total) by mouth daily. 30 tablet 3   Evolocumab  (REPATHA  SURECLICK) 140 MG/ML SOAJ Inject 140 mg into the skin every 14 (fourteen) days. 6 mL 3   ezetimibe  (ZETIA ) 10 MG tablet Take 1 tablet (10 mg total) by mouth daily. 90 tablet 3   furosemide  (LASIX ) 20 MG tablet Take 1 tablet (20 mg total) by mouth as needed for fluid or edema. For weight gain of 3lb in 24 hours or 5lb in a week. 8 tablet 4   hydrOXYzine  (ATARAX ) 25 MG tablet Take 1 tablet (25 mg total) by mouth at bedtime as needed. 30 tablet 1   losartan  (COZAAR ) 25 MG tablet Take 1 tablet (25 mg total) by mouth daily. 90 tablet 3   spironolactone  (ALDACTONE ) 25 MG tablet Take 0.5 tablets (12.5 mg total) by mouth daily. 30 tablet 1   No current facility-administered medications for this visit.   LMP  (LMP Unknown) Comment: Pt unsure why she no longer gets her period  Wt Readings from Last 3 Encounters:  09/04/23 66.3 kg (146 lb 2.6 oz)  08/28/23 65.8 kg (145 lb)  07/27/23 68.6 kg (151 lb 3.8 oz)   PHYSICAL EXAM: General: NAD Neck: No JVD, no thyromegaly or thyroid nodule.  Lungs: Clear to auscultation bilaterally with normal respiratory effort. CV: Nondisplaced PMI.  Heart regular S1/S2, no S3/S4, 1/6 SEM RUSB.  No peripheral edema.  No carotid bruit.  Normal pedal pulses.  Abdomen: Soft, nontender, no hepatosplenomegaly, no distention.  Skin: Intact without lesions or rashes.  Neurologic: Alert and oriented x 3.  Psych: Normal affect. Extremities: No clubbing or cyanosis.  HEENT: Normal.   ASSESSMENT & PLAN:  1. Chronic systolic CHF: Nonischemic CMP.  Echo 11/23 with  EF 20-25%, global hypokinesis, moderate LV dilation, normal RV, mod-severe AI.  RHC showed significantly elevated PCWP and LVEDP but normal RA pressure; CI 2.4 Fick/2.0 thermo.  Cause of cardiomyopathy  uncertain, CAD does not appear to have progressed (75% mLCx stenosis on 11/23 cath).  Possibly some component of substance abuse (cocaine) though by cardiac MRI in 11/23, prior myocarditis is also a concern (non-coronary LGE noted).  LV EF 18% on cMRI with relatively preserved RV function.  Aortic insufficiency also likely plays a role (moderate to severe on TEE in 11/23).  Repeat echo 3/24 showed improvement with EF 50-55%, normal RV, bicuspid aortic valve with moderate AS and moderate AI. Echo 1/25 EF 55-60%, RV normal, moderate/severe AI. Improvement in EF suggests a role for cocaine in her cardiomyopathy (she had quit using drugs).  Now s/p AVR 4/25. Echo 5/25 EF 70-75%, moderate LVH, mild RV dysfunction, mild mitral regurgitation, bioprosthetic aortic valve with mean gradient 17 mmHg in setting of hyperdynamic LV, IVC normal, moderate pericardial effusion.  She is not volume overloaded on exam, NYHA class I-II.  - Off Entresto , did not tolerate w/ hypotension. Would hold off retrying it now that EF is in normal range.  - Continue losartan  25 mg daily.   - Continue Coreg  12.5 mg bid.      - Continue Jardiance  10 mg daily  - Continue spironolactone  12.5 mg daily.  BMET/BNP today.  - She does not appear to need Lasix .  - Given moderate pericardial effusion noted on last echo (?post-pericardiotomy syndrome-related), will get repeat limited echo to make sure this has decreased/resolved.   2. CAD: DES to RCA in 9/22.  Cath in 11/23 with patent RCA stent, 50% proximal LAD, 75% mid LCx.  The LCx stenosis appeared similar to the prior cath, no intervention on LCx . Lp(a) markedly elevated with age-advanced CAD. Stable w/o CP  - Continue ASA 81 and Plavix  75 mg daily  - Continue atorvastatin  40 mg daily  -  Continue Repatha  - Continue Zetia  - Check lipids today.  3. Aortic valve disorder: Moderate-severe AI on 11/23 echo and TEE. TEE showed bicuspid aortic valve with fusion of right and noncoronary cusps.  Moderate-severe AI by TEE.  By cardiac MRI, aortic valve regurgitant fraction was 38% which suggests severe AI.  She was deemed not to be a TAVR candidate, but with improvement in LV function, she should be SAVR candidate. TEE in 4/24 showed mild-moderate AS with mean gradient 19 and AVA 1.57 cm^2, moderate to severe AI. Echo 1/25 showed bicuspid AoV, aortic regurgitation mod-severe. Mild-mod AoV stenosis, AoV mean gradient 11 mmHg. Now s/p bioprosthetic aortic valve replacement 04/2023. Echo 5/25 w/ stable prosthesis, mean gradient 17 mmHg (mild elevation likely due to hyperdynamic LV function).  - Recommended screening in 1st degree relatives for bicuspid aortic valve - Continue cardiac rehab.  4. Polysubstance abuse: Cocaine/heroin. She reports abstinence and EF has improved.  - Congratulated 5. H/o CVA: She is on Plavix . 6. Smoking: Quit since heart surgery.   Followup in 4 months with APP.   I spent 32 minutes reviewing records, interviewing/examining patient, and managing orders.    Harlene HERO [facility]   12/25/2023

## 2023-12-25 NOTE — H&P (View-Only) (Signed)
 "   Advanced Heart Failure Clinic Note   PCP: Delbert Clam, MD HF Cardiologist: Dr. Rolan  Leslie Walker is a 58 y.o. with a history of HTN, hyperlipidemia, stroke cypotogenic 2020/LINQ placement, CAD, polysubstance abuse heroin/cocaine abuse, and chronic HFrEF.    Admitted 09/2020 with NSTEMI. CT negative for PE but did show multivessel coronary calcium . Echo showed EF 45-50%, RV normal, moderate aortic valve stenosis. Had cath with overlapping DES to mid RCA, also with 70-80% left circumflex, left main patent, mid LAD 55%. Plan for DAPT with Aspirin  + ticagrelor   x 12 months.     Admitted 11/23 with CHF.  3 months PTA she had run out of all medications. Says she didn't call for refills.   Last used cocaine 7 days prior to admission and heroin about 2 weeks prior. She was diuresed with IV lasix  and GDMT titrated. She was enrolled in paramedicine program. Echo with EF 20-25%, RV okay, moderate to severe AI with possible mobile mass. TEE showed EF 20%, mildly reduced RV, bicuspid aortic valve with moderate to severe AI.  R/LHC in 11/23 showed 75% mid LCx stenosis, elevated PCWP and LVEDP but normal RV pressure, moderate pulmonary venous hypertension, CI low by thermo but preserved by Fick.  cMRI in 11/23 showed LVEF 18%, RVEF 48%, bicuspid aortic valve with severe AI, mid-wall LGE in inferolateral wall could be due to prior myocarditis but patient has CAD in LCx chronically.  Structural heart team reviewed her studies, not felt to be a candidate for TAVR.  Echo 3/24: EF 50-55%, normal RV, bicuspid aortic valve with moderate AS and probably moderate AI.  TEE (4/24) showed LVEF 55%, normal RV, bicuspid AoV with fused noncoronary and right coronary cusps, mild-moderate AS with mean gradient 19 and AVA 1.57 cm^2, moderate to severe AI.  Echo 1/25: EF 55-60%, no RWMA, RV normal, small pericardial effusion present, trivial MR, bicuspic AV, AV regurgitation mod-severe. Mild-mod AV stenosis. AV mean gradient 11  mmHg  [facility] 3/25 showed normal filling and PA pressures, preserved CI and 80% stenosis in the mid LCx.   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 Post op course was relatively unremarkable.    Echo in 5/25 showed EF 70-75%, moderate LVH, mild RV dysfunction, mild mitral regurgitation, bioprosthetic aortic valve with mean gradient 17 mmHg in setting of hyperdynamic LV, IVC normal, moderate pericardial effusion.   Today she returns for HF follow up. Overall feeling fine. Denies increasing SOB, palpitations, abnormal bleeding, CP, dizziness, edema, or PND/Orthopnea. Appetite ok. Weight at home 145 pounds. Taking all medications. She smokes a cigar occasionally, last used cocaine last week. Rarely takes Lasix . Working at [employer] shift part time.  ECG (personally reviewed): none ordered  Labs (1/24): K 4.2, creatinine 1.08 Labs (3/24): K 4.3, creatinine 1.03, LDL 55 Labs (7/24): K 4.3, creatinine 0.97 Labs (4/25): K 4.1, creatinine 0.95 Labs (8/25): K 4.7, creatinine 0.93, LDL 54  PMH: 1. Hyperlipidemia 2. CVA: 2020, cryptogenic.  LINQ monitor placed.  3. Polysubstance abuse: Cocaine, heroin.  4. CAD: NSTEMI 9/22 with DES to mid RCA.  - LHC (11/23): 75% mLCx, 50% pLAD.  5. Bicuspid aortic valve disorder:  - CTA chest 9/22 with no thoracic aortic aneurysm.  - TEE (11/23): Moderate-severe AI - Echo (3/24): Moderate AS with mean gradient 18/AVA 1.04 cm^2; moderate AI.  - Echo 1/25: EF 55-60%, no RWMA, RV normal, small pericardial effusion present, trivial MR, bicuspic AV, AV regurgitation mod-severe. Mild-mod AV stenosis. AV mean gradient 11 mmHg -  s/p AoV replacement 4/25 - Stable bioprosthetic AoV on echo 5/25.  6. Chronic systolic CHF: Nonischemic cardiomyopathy.  - RHC (11/23): mean RA 6, PA 51/29 mean 39, mean PCWP 32, CI 2.4 Fick with PVR 1.7 WU - cMRI (11/23): Moderate LV dilation with EF 18%, RV EF 48%, bicuspid aortic valve with severe AI, non-coronary LGE pattern with  mid-wall LGE in the inferolateral wall (?prior myocarditis) though patient does have CAD in LCx.   - Echo (3/24): EF 50-55%, normal RV, bicuspid aortic valve with moderate AS and probably moderate AI.  - TEE (4/24): LVEF 55%, normal RV, no LAA thrombus, PFO or ASD, bicuspid AoV withfused noncoronary and right coronary cusps, mild-moderate AS with mean gradient 19 and AVA 1.57 cm^2, moderate to severe AI. - Echo (1/25): EF 55-60%, no RWMA, RV normal, small pericardial effusion present, trivial MR, bicuspic AV, AV regurgitation mod-severe. Mild-mod AV stenosis. AV mean gradient 11 mmHg - R/LHC (2/25): 80% stenosis in mid Cx; RA 5, PA 30/9 (18), PCWP 12, CO/CI (Fick) 4.98/2.99 - Echo (5/25): EF 70-75%, moderate LVH, mild RV dysfunction, mild mitral regurgitation, bioprosthetic aortic valve with mean gradient 17 mmHg in setting of hyperdynamic LV, IVC normal, moderate pericardial effusion.  - Ltd echo (9/25): EF 70-75%, G1DD, normal RV, mild mitral regurgitation, moderate to severe AV stenosis, mean gradient 17 mmHg with AVA 1.5 cm2.  ROS: All systems negative except as listed in HPI, PMH and Problem List.  Social History   Socioeconomic History   Marital status: Single    Spouse name: Not on file   Number of children: 3   Years of education: Not on file   Highest education level: 12th grade  Occupational History   Not on file  Tobacco Use   Smoking status: Some Days    Current packs/day: 1.00    Types: Cigars, Cigarettes   Smokeless tobacco: Never   Tobacco comments:    A cigarette or black n mild once in a while  Vaping Use   Vaping status: Never Used  Substance and Sexual Activity   Alcohol use: Not Currently    Comment: social drinking   Drug use: Not Currently    Types: Cocaine, Marijuana    Comment: no use in about 1 year (04/26/23)   Sexual activity: Not Currently    Birth control/protection: Post-menopausal  Other Topics Concern   Not on file  Social History Narrative    Not on file   Social Drivers of Health   Financial Resource Strain: High Risk (12/14/2021)   Overall Financial Resource Strain (CARDIA)    Difficulty of Paying Living Expenses: Hard  Food Insecurity: Patient Declined (09/14/2023)   Hunger Vital Sign    Worried About Running Out of Food in the Last Year: Patient declined    Ran Out of Food in the Last Year: Patient declined  Transportation Needs: Patient Declined (09/14/2023)   PRAPARE - Administrator, Civil Service (Medical): Patient declined    Lack of Transportation (Non-Medical): Patient declined  Physical Activity: Inactive (02/21/2017)   Exercise Vital Sign    Days of Exercise per Week: 0 days    Minutes of Exercise per Session: 0 min  Stress: No Stress Concern Present (02/21/2017)   [employer] of Occupational Health - Occupational Stress Questionnaire    Feeling of Stress : Not at all  Social Connections: Unknown (05/09/2023)   Social Connection and Isolation Panel    Frequency of Communication with Friends and Family: Twice  a week    Frequency of Social Gatherings with Friends and Family: Twice a week    Attends Religious Services: Not on file    Active Member of Clubs or Organizations: Not on file    Attends Banker Meetings: Not on file    Marital Status: Not on file  Intimate Partner Violence: Not At Risk (05/09/2023)   Humiliation, Afraid, Rape, and Kick questionnaire    Fear of Current or Ex-Partner: No    Emotionally Abused: No    Physically Abused: No    Sexually Abused: No   FH:  Family History  Problem Relation Age of Onset   Hypertension Mother    Hypertension Father    Hypertension Brother    Stroke Sister    Past Medical History:  Diagnosis Date   Chronic systolic heart failure (HCC)    Cocaine abuse (HCC)    last use about 1 year ago (04/26/23)   Coronary artery disease    Dyslipidemia    Hypertension    Noncompliance w/medication treatment due to intermit use of  medication    Stroke (HCC) 2021   no deficits   Current Outpatient Medications  Medication Sig Dispense Refill   aspirin  EC 81 MG tablet Take 1 tablet (81 mg total) by mouth daily. Swallow whole.     atorvastatin  (LIPITOR) 40 MG tablet Take 1 tablet (40 mg total) by mouth daily at 6 PM. 90 tablet 3   Evolocumab  (REPATHA  SURECLICK) 140 MG/ML SOAJ Inject 140 mg into the skin every 14 (fourteen) days. 6 mL 3   ezetimibe  (ZETIA ) 10 MG tablet Take 1 tablet (10 mg total) by mouth daily. 90 tablet 3   furosemide  (LASIX ) 20 MG tablet Take 1 tablet (20 mg total) by mouth as needed for fluid or edema. For weight gain of 3lb in 24 hours or 5lb in a week. 8 tablet 4   hydrOXYzine  (ATARAX ) 25 MG tablet Take 1 tablet (25 mg total) by mouth at bedtime as needed. 30 tablet 1   carvedilol  (COREG ) 12.5 MG tablet Take 1 tablet (12.5 mg total) by mouth 2 (two) times daily with a meal. 90 tablet 3   clopidogrel  (PLAVIX ) 75 MG tablet Take 1 tablet (75 mg total) by mouth daily. 90 tablet 3   empagliflozin  (JARDIANCE ) 10 MG TABS tablet Take 1 tablet (10 mg total) by mouth daily. 30 tablet 3   losartan  (COZAAR ) 25 MG tablet Take 1 tablet (25 mg total) by mouth daily. 90 tablet 3   spironolactone  (ALDACTONE ) 25 MG tablet Take 0.5 tablets (12.5 mg total) by mouth daily. 90 tablet 1   No current facility-administered medications for this encounter.   Wt Readings from Last 3 Encounters:  12/27/23 67.8 kg (149 lb 6.4 oz)  09/04/23 66.3 kg (146 lb 2.6 oz)  08/28/23 65.8 kg (145 lb)   BP (!) 150/90   Pulse 70   Wt 67.8 kg (149 lb 6.4 oz)   LMP  (LMP Unknown) Comment: Pt unsure why she no longer gets her period  SpO2 97%   BMI 25.64 kg/m   PHYSICAL EXAM: General:  NAD. No resp difficulty, walked into clinic HEENT: Normal Neck: Supple. No JVD. Cor: Regular rate & rhythm. No rubs, gallops, 2/6 SEM RUSB Lungs: Clear Abdomen: Soft, nontender, nondistended.  Extremities: No cyanosis, clubbing, rash, edema Neuro:  Alert & oriented x 3, moves all 4 extremities w/o difficulty. Affect pleasant.  ASSESSMENT & PLAN:  1. Chronic systolic CHF: Nonischemic  CMP.  Echo 11/23 with EF 20-25%, global hypokinesis, moderate LV dilation, normal RV, mod-severe AI.  RHC showed significantly elevated PCWP and LVEDP but normal RA pressure; CI 2.4 Fick/2.0 thermo.  Cause of cardiomyopathy uncertain, CAD does not appear to have progressed (75% mLCx stenosis on 11/23 cath).  Possibly some component of substance abuse (cocaine) though by cardiac MRI in 11/23, prior myocarditis is also a concern (non-coronary LGE noted).  LV EF 18% on cMRI with relatively preserved RV function.  Aortic insufficiency also likely plays a role (moderate to severe on TEE in 11/23).  Repeat echo 3/24 showed improvement with EF 50-55%, normal RV, bicuspid aortic valve with moderate AS and moderate AI. Echo 1/25 EF 55-60%, RV normal, moderate/severe AI. Improvement in EF suggests a role for cocaine in her cardiomyopathy (she had quit using drugs).  Now s/p AVR 4/25. Echo 5/25 EF 70-75%, moderate LVH, mild RV dysfunction, mild mitral regurgitation, bioprosthetic aortic valve with mean gradient 17 mmHg in setting of hyperdynamic LV, IVC normal, moderate pericardial effusion.  Ltd echo 9/25 showed EF 70-75%, G1DD, normal RV, moderate to severe AS (see #3 below). She is not volume overloaded on exam, NYHA class I-II.  - Continue losartan  25 mg daily.  (Did not tolerate Entresto  with hypotension, will hold off retrying now that EF is in normal range) - Continue Coreg  12.5 mg bid.      - Continue Jardiance  10 mg daily.  - Continue spironolactone  12.5 mg daily.  BMET/BNP today.  - Continue Lasix  20 mg PRN.  - Repeat ltd echo 9/25 showed no pericardial effusion.   2. CAD: DES to RCA in 9/22.  Cath in 11/23 with patent RCA stent, 50% proximal LAD, 75% mid LCx.  The LCx stenosis appeared similar to the prior cath, no intervention on LCx . Lp(a) markedly elevated with  age-advanced CAD. No chest pain. - Continue ASA 81 and Plavix  75 mg daily  - Continue atorvastatin  40 mg daily  - Continue Repatha  - Continue Zetia  10 mg daily. 3. Aortic valve disorder: Moderate-severe AI on 11/23 echo and TEE. TEE showed bicuspid aortic valve with fusion of right and noncoronary cusps.  Moderate-severe AI by TEE.  By cardiac MRI, aortic valve regurgitant fraction was 38% which suggests severe AI.  She was deemed not to be a TAVR candidate, but with improvement in LV function, she should be SAVR candidate. TEE in 4/24 showed mild-moderate AS with mean gradient 19 and AVA 1.57 cm^2, moderate to severe AI. Echo 1/25 showed bicuspid AoV, aortic regurgitation mod-severe. Mild-mod AoV stenosis, AoV mean gradient 11 mmHg. Now s/p bioprosthetic aortic valve replacement 04/2023. Echo 5/25 w/ stable prosthesis, mean gradient 17 mmHg (mild elevation likely due to hyperdynamic LV function).  - Ltd echo 9/25 showed ? Moderate to severe bioprosthetic AoV stenosis, mean gradient lower 17 mmHg. Discussed with Dr. Rolan, recommend TEE to better evaluate valve. Informed Consent   Shared Decision Making/Informed Consent   The risks [esophageal damage, perforation (1:10,000 risk), bleeding, pharyngeal hematoma as well as other potential complications associated with conscious sedation including aspiration, arrhythmia, respiratory failure and death], benefits (treatment guidance and diagnostic support) and alternatives of a transesophageal echocardiogram were discussed in detail with Leslie. Koch and she is willing to proceed.      - Recommended screening in 1st degree relatives for bicuspid aortic valve 4. Polysubstance abuse: Cocaine/heroin. She reported abstinence and EF improved.  - Unfortunately used cocaine last week. Discussed importance of cessation. 5. H/o CVA: She  is on Plavix . 6. Smoking: Smokes black and mild cigars occasionally.  - Discussed cessation.  Follow up with APP after TEE.    Harlene HERO [place], FNP-BC 12/27/2023 "

## 2023-12-26 ENCOUNTER — Telehealth (HOSPITAL_COMMUNITY): Payer: Self-pay

## 2023-12-26 NOTE — Telephone Encounter (Signed)
 Called to confirm/remind patient of their appointment at the [facility] on 12/27/23 .   Appointment:   [] Confirmed  [x] Left mess   [] No answer/No voice mail  [] VM Full/unable to leave message  [] Phone not in service  And to bring in all medications and/or complete list.

## 2023-12-27 ENCOUNTER — Encounter (HOSPITAL_COMMUNITY): Payer: Self-pay

## 2023-12-27 ENCOUNTER — Other Ambulatory Visit: Payer: Self-pay

## 2023-12-27 ENCOUNTER — Ambulatory Visit (HOSPITAL_COMMUNITY): Payer: Self-pay | Admitting: Family Medicine

## 2023-12-27 ENCOUNTER — Other Ambulatory Visit (HOSPITAL_COMMUNITY): Payer: Self-pay

## 2023-12-27 ENCOUNTER — Ambulatory Visit (HOSPITAL_COMMUNITY)
Admission: RE | Admit: 2023-12-27 | Discharge: 2023-12-27 | Disposition: A | Discharge status: Home / Self Care | Source: Ambulatory Visit | Attending: Family Medicine | Admitting: Family Medicine

## 2023-12-27 VITALS — BP 150/90 | HR 70 | Wt 149.4 lb

## 2023-12-27 DIAGNOSIS — E785 Hyperlipidemia, unspecified: Secondary | ICD-10-CM | POA: Insufficient documentation

## 2023-12-27 DIAGNOSIS — Z79899 Other long term (current) drug therapy: Secondary | ICD-10-CM | POA: Insufficient documentation

## 2023-12-27 DIAGNOSIS — I5022 Chronic systolic (congestive) heart failure: Secondary | ICD-10-CM | POA: Diagnosis not present

## 2023-12-27 DIAGNOSIS — I350 Nonrheumatic aortic (valve) stenosis: Secondary | ICD-10-CM

## 2023-12-27 DIAGNOSIS — Z8673 Personal history of transient ischemic attack (TIA), and cerebral infarction without residual deficits: Secondary | ICD-10-CM | POA: Insufficient documentation

## 2023-12-27 DIAGNOSIS — I110 Hypertensive heart disease with heart failure: Secondary | ICD-10-CM | POA: Insufficient documentation

## 2023-12-27 DIAGNOSIS — I2510 Atherosclerotic heart disease of native coronary artery without angina pectoris: Secondary | ICD-10-CM | POA: Insufficient documentation

## 2023-12-27 DIAGNOSIS — Z955 Presence of coronary angioplasty implant and graft: Secondary | ICD-10-CM | POA: Insufficient documentation

## 2023-12-27 DIAGNOSIS — Z7984 Long term (current) use of oral hypoglycemic drugs: Secondary | ICD-10-CM | POA: Insufficient documentation

## 2023-12-27 DIAGNOSIS — Z87891 Personal history of nicotine dependence: Secondary | ICD-10-CM

## 2023-12-27 DIAGNOSIS — F17290 Nicotine dependence, other tobacco product, uncomplicated: Secondary | ICD-10-CM | POA: Insufficient documentation

## 2023-12-27 DIAGNOSIS — F1110 Opioid abuse, uncomplicated: Secondary | ICD-10-CM | POA: Insufficient documentation

## 2023-12-27 DIAGNOSIS — I351 Nonrheumatic aortic (valve) insufficiency: Secondary | ICD-10-CM

## 2023-12-27 DIAGNOSIS — Z7902 Long term (current) use of antithrombotics/antiplatelets: Secondary | ICD-10-CM | POA: Insufficient documentation

## 2023-12-27 DIAGNOSIS — I428 Other cardiomyopathies: Secondary | ICD-10-CM | POA: Insufficient documentation

## 2023-12-27 DIAGNOSIS — Z7982 Long term (current) use of aspirin: Secondary | ICD-10-CM | POA: Insufficient documentation

## 2023-12-27 DIAGNOSIS — F1910 Other psychoactive substance abuse, uncomplicated: Secondary | ICD-10-CM | POA: Diagnosis not present

## 2023-12-27 DIAGNOSIS — F1410 Cocaine abuse, uncomplicated: Secondary | ICD-10-CM | POA: Insufficient documentation

## 2023-12-27 DIAGNOSIS — I252 Old myocardial infarction: Secondary | ICD-10-CM | POA: Insufficient documentation

## 2023-12-27 DIAGNOSIS — Z952 Presence of prosthetic heart valve: Secondary | ICD-10-CM | POA: Insufficient documentation

## 2023-12-27 LAB — BASIC METABOLIC PANEL WITH GFR
Anion gap: 11 (ref 5–15)
BUN: 10 mg/dL (ref 6–20)
CO2: 25 mmol/L (ref 22–32)
Calcium: 8.7 mg/dL — ABNORMAL LOW (ref 8.9–10.3)
Chloride: 104 mmol/L (ref 98–111)
Creatinine, Ser: 0.85 mg/dL (ref 0.44–1.00)
GFR, Estimated: >60 mL/min (ref >60)
Glucose, Bld: 106 mg/dL — ABNORMAL HIGH (ref 70–99)
Potassium: 4.1 mmol/L (ref 3.5–5.1)
Sodium: 140 mmol/L (ref 135–145)

## 2023-12-27 LAB — BRAIN NATRIURETIC PEPTIDE: B Natriuretic Peptide: 62 pg/mL (ref 0.0–100.0)

## 2023-12-27 MED ORDER — EMPAGLIFLOZIN 10 MG PO TABS
10.0000 mg | ORAL_TABLET | Freq: Every day | ORAL | 3 refills | Status: AC
  Filled 2023-12-27: qty 30, 30d supply, fill #0

## 2023-12-27 MED ORDER — CARVEDILOL 12.5 MG PO TABS
12.5000 mg | ORAL_TABLET | Freq: Two times a day (BID) | ORAL | 3 refills | Status: AC
  Filled 2023-12-27: qty 90, 45d supply, fill #0

## 2023-12-27 MED ORDER — CLOPIDOGREL BISULFATE 75 MG PO TABS
75.0000 mg | ORAL_TABLET | Freq: Every day | ORAL | 3 refills | Status: AC
  Filled 2023-12-27: qty 90, 90d supply, fill #0

## 2023-12-27 MED ORDER — LOSARTAN POTASSIUM 25 MG PO TABS
25.0000 mg | ORAL_TABLET | Freq: Every day | ORAL | 3 refills | Status: AC
  Filled 2023-12-27: qty 90, 90d supply, fill #0

## 2023-12-27 MED ORDER — SPIRONOLACTONE 25 MG PO TABS
12.5000 mg | ORAL_TABLET | Freq: Every day | ORAL | 1 refills | Status: AC
  Filled 2023-12-27: qty 45, 90d supply, fill #0

## 2023-12-27 NOTE — Addendum Note (Signed)
 Encounter addended by: Glena Harlene HERO, FNP on: 12/27/2023 2:54 PM  Actions taken: Vitals modified, Clinical Note Signed

## 2023-12-27 NOTE — Addendum Note (Signed)
 Encounter addended by: Alben Jepsen M, RN on: 12/27/2023 3:10 PM  Actions taken: Charge Capture section accepted

## 2023-12-27 NOTE — Patient Instructions (Addendum)
 Good to see you today!      You are scheduled for a TEE (Transesophageal Echocardiogram) on Wednesday, December 31 with Dr. Ezra Shuck.  Please arrive at the [facility] (Main Entrance A) at [facility]: [address] [place], [place] [address] at 8:30 AM (This time is 1 hour(s) before your procedure to ensure your preparation).   Free valet parking service is available. You will check in at ADMITTING.   *Please Note: You will receive a call the day before your procedure to confirm the appointment time. That time may have changed from the original time based on the schedule for that day.*    DIET:  Nothing to eat or drink after midnight except a sip of water with medications (see medication instructions below)  HOLD: Empagliflozin  (Jardiance ) for 3 days prior to the procedure. Last dose on Sunday, December 28.  AM of procedure hold Spironolactone  lasix  and jardiance  FYI:  For your safety, and to allow us  to monitor your vital signs accurately during the surgery/procedure we request: If you have artificial nails, gel coating, SNS etc, please have those removed prior to your surgery/procedure. Not having the nail coverings /polish removed may result in cancellation or delay of your surgery/procedure.  Your support person will be asked to wait in the waiting room during your procedure.  It is OK to have someone drop you off and come back when you are ready to be discharged.  You cannot drive after the procedure and will need someone to drive you home.  Bring your insurance cards.  *Special Note: Every effort is made to have your procedure done on time. Occasionally there are emergencies that occur at the hospital that may cause delays. Please be patient if a delay does occur.     Labs done today, your results will be available in MyChart, we will contact you for abnormal readings.  Your physician recommends that you schedule a follow-up appointment as scheduled  If you have  any questions or concerns before your next appointment please send us  a message through [facility] or call our office at [phone].    TO LEAVE A MESSAGE FOR THE NURSE SELECT OPTION 2, PLEASE LEAVE A MESSAGE INCLUDING: YOUR NAME DATE OF BIRTH CALL BACK NUMBER REASON FOR CALL**this is important as we prioritize the call backs  YOU WILL RECEIVE A CALL BACK THE SAME DAY AS LONG AS YOU CALL BEFORE 4:00 PM At the [facility] Clinic, you and your health needs are our priority. As part of our continuing mission to provide you with exceptional heart care, we have created designated Provider Care Teams. These Care Teams include your primary Cardiologist (physician) and Advanced Practice Providers (APPs- Physician Assistants and Nurse Practitioners) who all work together to provide you with the care you need, when you need it.   You may see any of the following providers on your designated Care Team at your next follow up: Dr Toribio Fuel Dr Ezra Shuck Dr. Morene Brownie Greig Mosses, NP Caffie Shed, [place] [facility] [place], [place] Beckey Coe, NP Jordan Lee, NP Ellouise Class, NP Tinnie Redman, PharmD Jaun Bash, PharmD   Please be sure to bring in all your medications bottles to every appointment.    Thank you for choosing  [facility]

## 2024-01-01 ENCOUNTER — Other Ambulatory Visit: Payer: Self-pay

## 2024-01-05 NOTE — Progress Notes (Signed)
 The patient attended a screening event on 09/14/2023 where her screening results were a BP of 134/87. At the screening event pt declined having any SDOH needs. Per chart review the patient has Dr. Enobong Newlin as her PCP, has Medicaid for insurance, and pt smokes tobacco. Chart review also indicates the patient last saw Dr. Newlin on 05/09/2023 for foot callus. At the appointment on 05/09/2023 pt BP was 99/67. According to chart review pt stated she quit smoking two week ago in preparation for her surgery and she has not resumed to smoking pos-operatively on 05/09/2023. Blood pressure low but asymptomatic. No lightheadedness pt was instructed to continue current medication by pcp. Chart review also indicates pt is being seen by a cardiologist has a future appt for procedure on 01/17/2024 and 01/31/2024. No 60-day f/u needed at this time. No additional Health equity team support indicated at this time.

## 2024-01-16 NOTE — Progress Notes (Signed)
 Pt called for pre procedure instructions.  Message left on ID voicemail, encouraged to call back for questions. Arrival time 0830 NPO after midnight explained Instructed to take am meds with sip of water and confirmed blood thinner consistency Instructed pt need for ride home tomorrow and have responsible adult with them for 24 hrs post procedure.

## 2024-01-17 ENCOUNTER — Ambulatory Visit (HOSPITAL_COMMUNITY)

## 2024-01-17 ENCOUNTER — Other Ambulatory Visit: Payer: Self-pay

## 2024-01-17 ENCOUNTER — Telehealth (HOSPITAL_COMMUNITY): Payer: Self-pay

## 2024-01-17 ENCOUNTER — Other Ambulatory Visit (HOSPITAL_COMMUNITY): Payer: Self-pay

## 2024-01-17 ENCOUNTER — Encounter (HOSPITAL_COMMUNITY)
Admission: RE | Disposition: A | Discharge status: Home / Self Care | Payer: Self-pay | Source: Home / Self Care | Attending: Cardiology

## 2024-01-17 ENCOUNTER — Ambulatory Visit (HOSPITAL_COMMUNITY): Admitting: Anesthesiology

## 2024-01-17 ENCOUNTER — Encounter (HOSPITAL_COMMUNITY): Payer: Self-pay | Admitting: Cardiology

## 2024-01-17 ENCOUNTER — Ambulatory Visit (HOSPITAL_COMMUNITY)
Admission: RE | Admit: 2024-01-17 | Discharge: 2024-01-17 | Disposition: A | Discharge status: Home / Self Care | Attending: Cardiology | Admitting: Cardiology

## 2024-01-17 DIAGNOSIS — Z8673 Personal history of transient ischemic attack (TIA), and cerebral infarction without residual deficits: Secondary | ICD-10-CM | POA: Diagnosis not present

## 2024-01-17 DIAGNOSIS — F1411 Cocaine abuse, in remission: Secondary | ICD-10-CM | POA: Insufficient documentation

## 2024-01-17 DIAGNOSIS — Z7902 Long term (current) use of antithrombotics/antiplatelets: Secondary | ICD-10-CM | POA: Insufficient documentation

## 2024-01-17 DIAGNOSIS — Z953 Presence of xenogenic heart valve: Secondary | ICD-10-CM | POA: Insufficient documentation

## 2024-01-17 DIAGNOSIS — I77810 Thoracic aortic ectasia: Secondary | ICD-10-CM | POA: Insufficient documentation

## 2024-01-17 DIAGNOSIS — T82897A Other specified complication of cardiac prosthetic devices, implants and grafts, initial encounter: Secondary | ICD-10-CM

## 2024-01-17 DIAGNOSIS — I428 Other cardiomyopathies: Secondary | ICD-10-CM | POA: Diagnosis not present

## 2024-01-17 DIAGNOSIS — F17210 Nicotine dependence, cigarettes, uncomplicated: Secondary | ICD-10-CM | POA: Diagnosis not present

## 2024-01-17 DIAGNOSIS — Z79899 Other long term (current) drug therapy: Secondary | ICD-10-CM | POA: Diagnosis not present

## 2024-01-17 DIAGNOSIS — Z955 Presence of coronary angioplasty implant and graft: Secondary | ICD-10-CM | POA: Diagnosis not present

## 2024-01-17 DIAGNOSIS — Z7982 Long term (current) use of aspirin: Secondary | ICD-10-CM | POA: Diagnosis not present

## 2024-01-17 DIAGNOSIS — I252 Old myocardial infarction: Secondary | ICD-10-CM | POA: Diagnosis not present

## 2024-01-17 DIAGNOSIS — I5022 Chronic systolic (congestive) heart failure: Secondary | ICD-10-CM | POA: Insufficient documentation

## 2024-01-17 DIAGNOSIS — I110 Hypertensive heart disease with heart failure: Secondary | ICD-10-CM | POA: Diagnosis not present

## 2024-01-17 DIAGNOSIS — I2510 Atherosclerotic heart disease of native coronary artery without angina pectoris: Secondary | ICD-10-CM | POA: Diagnosis not present

## 2024-01-17 DIAGNOSIS — I350 Nonrheumatic aortic (valve) stenosis: Secondary | ICD-10-CM | POA: Diagnosis not present

## 2024-01-17 DIAGNOSIS — E785 Hyperlipidemia, unspecified: Secondary | ICD-10-CM | POA: Insufficient documentation

## 2024-01-17 DIAGNOSIS — I509 Heart failure, unspecified: Secondary | ICD-10-CM | POA: Diagnosis not present

## 2024-01-17 HISTORY — PX: TRANSESOPHAGEAL ECHOCARDIOGRAM (CATH LAB): EP1270

## 2024-01-17 LAB — POCT I-STAT, CHEM 8
BUN: 19 mg/dL (ref 6–20)
Calcium, Ion: 1.22 mmol/L (ref 1.15–1.40)
Chloride: 104 mmol/L (ref 98–111)
Creatinine, Ser: 0.9 mg/dL (ref 0.44–1.00)
Glucose, Bld: 100 mg/dL — ABNORMAL HIGH (ref 70–99)
HCT: 39 % (ref 36.0–46.0)
Hemoglobin: 13.3 g/dL (ref 12.0–15.0)
Potassium: 4.3 mmol/L (ref 3.5–5.1)
Sodium: 141 mmol/L (ref 135–145)
TCO2: 26 mmol/L (ref 22–32)

## 2024-01-17 SURGERY — TRANSESOPHAGEAL ECHOCARDIOGRAM (TEE) (CATHLAB)
Anesthesia: Monitor Anesthesia Care

## 2024-01-17 MED ORDER — SODIUM CHLORIDE 0.9 % IV SOLN: INTRAVENOUS | Status: DC | PRN

## 2024-01-17 MED ORDER — APIXABAN 5 MG PO TABS
5.0000 mg | ORAL_TABLET | Freq: Two times a day (BID) | ORAL | 6 refills | Status: AC
  Filled 2024-01-17: qty 60, 30d supply, fill #0

## 2024-01-17 MED ORDER — PROPOFOL 500 MG/50ML IV EMUL
INTRAVENOUS | Status: DC | PRN
  Administered 2024-01-17: 120 ug/kg/min via INTRAVENOUS

## 2024-01-17 MED ORDER — SODIUM CHLORIDE 0.9 % IV SOLN: INTRAVENOUS | Status: DC

## 2024-01-17 MED ORDER — LIDOCAINE 2% (20 MG/ML) 5 ML SYRINGE
INTRAMUSCULAR | Status: DC | PRN
  Administered 2024-01-17: 80 mg via INTRAVENOUS

## 2024-01-17 NOTE — Interval H&P Note (Signed)
 History and Physical Interval Note:  01/17/2024 10:11 AM  Leslie Walker  has presented today for surgery, with the diagnosis of AORTIC STENOSIS.  The various methods of treatment have been discussed with the patient and family. After consideration of risks, benefits and other options for treatment, the patient has consented to  Procedures: TRANSESOPHAGEAL ECHOCARDIOGRAM (N/A) as a surgical intervention.  The patient's history has been reviewed, patient examined, no change in status, stable for surgery.  I have reviewed the patient's chart and labs.  Questions were answered to the patient's satisfaction.     Drystan Reader [employer]

## 2024-01-17 NOTE — Anesthesia Postprocedure Evaluation (Signed)
"   Anesthesia Post Note  Patient: Leslie Walker  Procedure(s) Performed: TRANSESOPHAGEAL ECHOCARDIOGRAM     Patient location during evaluation: Endoscopy Anesthesia Type: MAC Level of consciousness: awake and alert, patient cooperative and oriented Pain management: pain level controlled Vital Signs Assessment: post-procedure vital signs reviewed and stable Respiratory status: spontaneous breathing, nonlabored ventilation and respiratory function stable Cardiovascular status: blood pressure returned to baseline and stable Postop Assessment: no apparent nausea or vomiting Anesthetic complications: no   No notable events documented.  Last Vitals:  Vitals:   01/17/24 1101 01/17/24 1111  BP: 116/69 (!) 142/80  Pulse: 63 66  Resp: 14 11  Temp:    SpO2: 98% 99%    Last Pain:  Vitals:   01/17/24 1111  TempSrc:   PainSc: 0-No pain                 Lazarus [place],E. Keiasia Christianson      "

## 2024-01-17 NOTE — Anesthesia Preprocedure Evaluation (Addendum)
"                                    Anesthesia Evaluation  Patient identified by MRN, date of birth, ID band Patient awake    Reviewed: Allergy & Precautions, NPO status , Patient's Chart, lab work & pertinent test results  History of Anesthesia Complications Negative for: history of anesthetic complications  Airway Mallampati: I  TM Distance: >3 FB Neck ROM: Full    Dental  (+) Edentulous Upper, Edentulous Lower   Pulmonary Current Smoker and Patient abstained from smoking.   Pulmonary exam normal        Cardiovascular hypertension, Pt. on medications (-) angina + CAD, + Past MI, + Cardiac Stents and +CHF  + Valvular Problems/Murmurs (no s/p AVR) AS  Rhythm:Regular Rate:Normal  ECHO: 70 to 75%.  1. The LV has hyperdynamic function, no regional wall motion abnormalities. There is moderate concentric LVH diastolic parameters are indeterminate.   2. RVF is mildly reduced. The right ventricular size is normal. Tricuspid regurgitation signal is inadequate for assessing PA pressure.   3. Moderate pericardial effusion. The pericardial effusion is circumferential. There is no evidence of cardiac tamponade.   4. The mitral valve is normal in structure. Mild mitral valve regurgitation. No evidence of mitral stenosis.   5. Aortic valve gradients are increased but appear to principally related to hyperdynamic function. The aortic valve has been repaired/replaced. Aortic valve regurgitation is not visualized. There is a 21 mm Inspiris Resilia valve present in the aortic position. Procedure Date: 04/28/2023. Aortic valve mean gradient measures 17.0 mmHg. Aortic valve Vmax measures 2.69 m/s. Aortic valve acceleration time measures 90 msec.     Neuro/Psych CVA, No Residual Symptoms    GI/Hepatic negative GI ROS,,,(+)     substance abuse (last a few days ago)  cocaine use  Endo/Other  negative endocrine ROS    Renal/GU negative Renal ROS     Musculoskeletal   Abdominal    Peds  Hematology plavix    Anesthesia Other Findings   Reproductive/Obstetrics                              Anesthesia Physical Anesthesia Plan  ASA: 3  Anesthesia Plan: MAC   Post-op Pain Management: Minimal or no pain anticipated   Induction:   PONV Risk Score and Plan: 1 and Treatment may vary due to age or medical condition  Airway Management Planned: Natural Airway and Simple Face Mask  Additional Equipment: None  Intra-op Plan:   Post-operative Plan:   Informed Consent: I have reviewed the patients History and Physical, chart, labs and discussed the procedure including the risks, benefits and alternatives for the proposed anesthesia with the patient or authorized representative who has indicated his/her understanding and acceptance.     Dental advisory given  Plan Discussed with: CRNA and Surgeon  Anesthesia Plan Comments:          Anesthesia Quick Evaluation  "

## 2024-01-17 NOTE — Telephone Encounter (Signed)
 Patient Advocate Encounter  Test billing for this patient's current coverage ([employer]) returns a $4 copay for 90 day supply of Eliquis.  This test claim was processed through [facility]- copay amounts may vary at other pharmacies due to pharmacy/plan contracts, or as the patient moves through the different stages of their insurance plan.  Rachel DEL, CPhT Rx Patient Advocate Phone: [phone]

## 2024-01-17 NOTE — Discharge Instructions (Signed)

## 2024-01-17 NOTE — CV Procedure (Signed)
 Procedure: TEE  Sedation: Per anesthesiology  Indication: Bioprosthetic aortic valve abnormality  Findings: Please see echo section for full report.  One of the leaflets of the bioprosthetic aortic valve appeared restricted.  The mean gradient was 23 mmHg across the valve.    Impression: Restriction of the bioprosthetic aortic valve, ?partial thrombosis versus pannus.  I will stop her aspirin  and start her on Eliquis 5 mg bid.  She will need a structural CT to assess the aortic valve.   Ezra Shuck 01/17/2024 10:41 AM

## 2024-01-17 NOTE — Transfer of Care (Signed)
 Immediate Anesthesia Transfer of Care Note  Patient: Leslie Walker  Procedure(s) Performed: TRANSESOPHAGEAL ECHOCARDIOGRAM  Patient Location: PACU and Cath Lab  Anesthesia Type:MAC  Level of Consciousness: drowsy  Airway & Oxygen Therapy: Patient Spontanous Breathing  Post-op Assessment: Report given to RN  Post vital signs: stable  Last Vitals:  Vitals Value Taken Time  BP 109/60 01/17/24 10:41  Temp 36.1 C 01/17/24 10:41  Pulse 64 01/17/24 10:41  Resp 14 01/17/24 10:41  SpO2 97 % 01/17/24 10:41  Vitals shown include unfiled device data.  Last Pain:  Vitals:   01/17/24 1041  TempSrc: Tympanic  PainSc: Asleep         Complications: No notable events documented.

## 2024-01-18 LAB — ECHO TEE
AR max vel: 1.04 cm2
AV Area VTI: 1.18 cm2
AV Area mean vel: 1.11 cm2
AV Mean grad: 20 mmHg
AV Peak grad: 34 mmHg
Ao pk vel: 2.92 m/s

## 2024-01-30 ENCOUNTER — Telehealth (HOSPITAL_COMMUNITY): Payer: Self-pay

## 2024-01-30 NOTE — Progress Notes (Signed)
 "   Advanced [facility] Failure Clinic Note   PCP: Delbert Clam, MD HF Cardiologist: Dr. Rolan  Ms Collazos is a 59 y.o. with a history of HTN, hyperlipidemia, stroke cypotogenic 2020/LINQ placement, CAD, polysubstance abuse heroin/cocaine abuse, and chronic HFrEF.    Admitted 09/2020 with NSTEMI. CT negative for PE but did show multivessel coronary calcium . Echo showed EF 45-50%, RV normal, moderate aortic valve stenosis. Had cath with overlapping DES to mid RCA, also with 70-80% left circumflex, left main patent, mid LAD 55%. Plan for DAPT with Aspirin  + ticagrelor   x 12 months.     Admitted 11/23 with CHF.  3 months PTA she had run out of all medications. Says she didn't call for refills.   Last used cocaine 7 days prior to admission and heroin about 2 weeks prior. She was diuresed with IV lasix  and GDMT titrated. She was enrolled in paramedicine program. Echo with EF 20-25%, RV okay, moderate to severe AI with possible mobile mass. TEE showed EF 20%, mildly reduced RV, bicuspid aortic valve with moderate to severe AI.  R/LHC in 11/23 showed 75% mid LCx stenosis, elevated PCWP and LVEDP but normal RV pressure, moderate pulmonary venous hypertension, CI low by thermo but preserved by Fick.  cMRI in 11/23 showed LVEF 18%, RVEF 48%, bicuspid aortic valve with severe AI, mid-wall LGE in inferolateral wall could be due to prior myocarditis but patient has CAD in LCx chronically.  Structural heart team reviewed her studies, not felt to be a candidate for TAVR.  Echo 3/24: EF 50-55%, normal RV, bicuspid aortic valve with moderate AS and probably moderate AI.  TEE (4/24) showed LVEF 55%, normal RV, bicuspid AoV with fused noncoronary and right coronary cusps, mild-moderate AS with mean gradient 19 and AVA 1.57 cm^2, moderate to severe AI.  Echo 1/25: EF 55-60%, no RWMA, RV normal, small pericardial effusion present, trivial MR, bicuspic AV, AV regurgitation mod-severe. Mild-mod AV stenosis. AV mean gradient 11  mmHg  [facility] 3/25 showed normal filling and PA pressures, preserved CI and 80% stenosis in the mid LCx.   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 Post op course was relatively unremarkable.    Echo in 5/25 showed EF 70-75%, moderate LVH, mild RV dysfunction, mild mitral regurgitation, bioprosthetic aortic valve with mean gradient 17 mmHg in setting of hyperdynamic LV, IVC normal, moderate pericardial effusion.   Today she returns for HF follow up. Overall feeling fine. Denies increasing SOB, palpitations, abnormal bleeding, CP, dizziness, edema, or PND/Orthopnea. Appetite ok. Weight at home 145 pounds. Taking all medications. She smokes a cigar occasionally, last used cocaine last week. Rarely takes Lasix . Working at [employer] shift part time.  ECG (personally reviewed): none ordered  Labs (1/24): K 4.2, creatinine 1.08 Labs (3/24): K 4.3, creatinine 1.03, LDL 55 Labs (7/24): K 4.3, creatinine 0.97 Labs (4/25): K 4.1, creatinine 0.95 Labs (8/25): K 4.7, creatinine 0.93, LDL 54  PMH: 1. Hyperlipidemia 2. CVA: 2020, cryptogenic.  LINQ monitor placed.  3. Polysubstance abuse: Cocaine, heroin.  4. CAD: NSTEMI 9/22 with DES to mid RCA.  - LHC (11/23): 75% mLCx, 50% pLAD.  5. Bicuspid aortic valve disorder:  - CTA chest 9/22 with no thoracic aortic aneurysm.  - TEE (11/23): Moderate-severe AI - Echo (3/24): Moderate AS with mean gradient 18/AVA 1.04 cm^2; moderate AI.  - Echo 1/25: EF 55-60%, no RWMA, RV normal, small pericardial effusion present, trivial MR, bicuspic AV, AV regurgitation mod-severe. Mild-mod AV stenosis. AV mean gradient 11 mmHg -  s/p AoV replacement 4/25 - Stable bioprosthetic AoV on echo 5/25.  6. Chronic systolic CHF: Nonischemic cardiomyopathy.  - RHC (11/23): mean RA 6, PA 51/29 mean 39, mean PCWP 32, CI 2.4 Fick with PVR 1.7 WU - cMRI (11/23): Moderate LV dilation with EF 18%, RV EF 48%, bicuspid aortic valve with severe AI, non-coronary LGE pattern with  mid-wall LGE in the inferolateral wall (?prior myocarditis) though patient does have CAD in LCx.   - Echo (3/24): EF 50-55%, normal RV, bicuspid aortic valve with moderate AS and probably moderate AI.  - TEE (4/24): LVEF 55%, normal RV, no LAA thrombus, PFO or ASD, bicuspid AoV withfused noncoronary and right coronary cusps, mild-moderate AS with mean gradient 19 and AVA 1.57 cm^2, moderate to severe AI. - Echo (1/25): EF 55-60%, no RWMA, RV normal, small pericardial effusion present, trivial MR, bicuspic AV, AV regurgitation mod-severe. Mild-mod AV stenosis. AV mean gradient 11 mmHg - R/LHC (2/25): 80% stenosis in mid Cx; RA 5, PA 30/9 (18), PCWP 12, CO/CI (Fick) 4.98/2.99 - Echo (5/25): EF 70-75%, moderate LVH, mild RV dysfunction, mild mitral regurgitation, bioprosthetic aortic valve with mean gradient 17 mmHg in setting of hyperdynamic LV, IVC normal, moderate pericardial effusion.  - Ltd echo (9/25): EF 70-75%, G1DD, normal RV, mild mitral regurgitation, moderate to severe AV stenosis, mean gradient 17 mmHg with AVA 1.5 cm2.  ROS: All systems negative except as listed in HPI, PMH and Problem List.  Social History   Socioeconomic History   Marital status: Single    Spouse name: Not on file   Number of children: 3   Years of education: Not on file   Highest education level: 12th grade  Occupational History   Not on file  Tobacco Use   Smoking status: Some Days    Current packs/day: 1.00    Types: Cigars, Cigarettes   Smokeless tobacco: Never   Tobacco comments:    A cigarette or black n mild once in a while  Vaping Use   Vaping status: Never Used  Substance and Sexual Activity   Alcohol use: Not Currently    Comment: social drinking   Drug use: Not Currently    Types: Cocaine, Marijuana    Comment: no use in about 1 year (04/26/23)   Sexual activity: Not Currently    Birth control/protection: Post-menopausal  Other Topics Concern   Not on file  Social History Narrative    Not on file   Social Drivers of Health   Tobacco Use: High Risk (12/27/2023)   Patient History    Smoking Tobacco Use: Some Days    Smokeless Tobacco Use: Never    Passive Exposure: Not on file  Financial Resource Strain: High Risk (12/14/2021)   Overall Financial Resource Strain (CARDIA)    Difficulty of Paying Living Expenses: Hard  Food Insecurity: Patient Declined (09/14/2023)   Epic    Worried About Programme Researcher, Broadcasting/film/video in the Last Year: Patient declined    Barista in the Last Year: Patient declined  Transportation Needs: Patient Declined (09/14/2023)   Epic    Lack of Transportation (Medical): Patient declined    Lack of Transportation (Non-Medical): Patient declined  Physical Activity: Not on file  Stress: Not on file  Social Connections: Unknown (05/09/2023)   Social Connection and Isolation Panel    Frequency of Communication with Friends and Family: Twice a week    Frequency of Social Gatherings with Friends and Family: Twice a week  Attends Religious Services: Not on file    Active Member of Clubs or Organizations: Not on file    Attends Club or Organization Meetings: Not on file    Marital Status: Not on file  Intimate Partner Violence: Not At Risk (05/09/2023)   Humiliation, Afraid, Rape, and Kick questionnaire    Fear of Current or Ex-Partner: No    Emotionally Abused: No    Physically Abused: No    Sexually Abused: No  Depression (PHQ2-9): Low Risk (09/04/2023)   Depression (PHQ2-9)    PHQ-2 Score: 0  Alcohol Screen: Not on file  Housing: Patient Declined (09/14/2023)   Epic    Unable to Pay for Housing in the Last Year: Patient declined    Number of Times Moved in the Last Year: Not on file    Homeless in the Last Year: Patient declined  Utilities: Patient Declined (09/14/2023)   Epic    Threatened with loss of utilities: Patient declined  Health Literacy: Not on file   FH:  Family History  Problem Relation Age of Onset   Hypertension Mother     Hypertension Father    Hypertension Brother    Stroke Sister    Past Medical History:  Diagnosis Date   Chronic systolic heart failure (HCC)    Cocaine abuse (HCC)    last use about 1 year ago (04/26/23)   Coronary artery disease    Dyslipidemia    Hypertension    Noncompliance w/medication treatment due to intermit use of medication    Stroke (HCC) 2021   no deficits   Current Outpatient Medications  Medication Sig Dispense Refill   apixaban  (ELIQUIS ) 5 MG TABS tablet Take 1 tablet (5 mg total) by mouth 2 (two) times daily. 60 tablet 6   atorvastatin  (LIPITOR) 40 MG tablet Take 1 tablet (40 mg total) by mouth daily at 6 PM. 90 tablet 3   carvedilol  (COREG ) 12.5 MG tablet Take 1 tablet (12.5 mg total) by mouth 2 (two) times daily with a meal. 90 tablet 3   clopidogrel  (PLAVIX ) 75 MG tablet Take 1 tablet (75 mg total) by mouth daily. 90 tablet 3   empagliflozin  (JARDIANCE ) 10 MG TABS tablet Take 1 tablet (10 mg total) by mouth daily. 30 tablet 3   Evolocumab  (REPATHA  SURECLICK) 140 MG/ML SOAJ Inject 140 mg into the skin every 14 (fourteen) days. 6 mL 3   ezetimibe  (ZETIA ) 10 MG tablet Take 1 tablet (10 mg total) by mouth daily. 90 tablet 3   furosemide  (LASIX ) 20 MG tablet Take 1 tablet (20 mg total) by mouth as needed for fluid or edema. For weight gain of 3lb in 24 hours or 5lb in a week. 8 tablet 4   hydrOXYzine  (ATARAX ) 25 MG tablet Take 1 tablet (25 mg total) by mouth at bedtime as needed. 30 tablet 1   losartan  (COZAAR ) 25 MG tablet Take 1 tablet (25 mg total) by mouth daily. 90 tablet 3   spironolactone  (ALDACTONE ) 25 MG tablet Take 0.5 tablets (12.5 mg total) by mouth daily. 90 tablet 1   No current facility-administered medications for this visit.   Wt Readings from Last 3 Encounters:  12/27/23 67.8 kg (149 lb 6.4 oz)  09/04/23 66.3 kg (146 lb 2.6 oz)  08/28/23 65.8 kg (145 lb)   LMP  (LMP Unknown) Comment: Pt unsure why she no longer gets her period  PHYSICAL  EXAM: General:  NAD. No resp difficulty, walked into clinic HEENT: Normal Neck: Supple. No  JVD. Cor: Regular rate & rhythm. No rubs, gallops, 2/6 SEM RUSB Lungs: Clear Abdomen: Soft, nontender, nondistended.  Extremities: No cyanosis, clubbing, rash, edema Neuro: Alert & oriented x 3, moves all 4 extremities w/o difficulty. Affect pleasant.  ASSESSMENT & PLAN:  1. Chronic systolic CHF: Nonischemic CMP.  Echo 11/23 with EF 20-25%, global hypokinesis, moderate LV dilation, normal RV, mod-severe AI.  RHC showed significantly elevated PCWP and LVEDP but normal RA pressure; CI 2.4 Fick/2.0 thermo.  Cause of cardiomyopathy uncertain, CAD does not appear to have progressed (75% mLCx stenosis on 11/23 cath).  Possibly some component of substance abuse (cocaine) though by cardiac MRI in 11/23, prior myocarditis is also a concern (non-coronary LGE noted).  LV EF 18% on cMRI with relatively preserved RV function.  Aortic insufficiency also likely plays a role (moderate to severe on TEE in 11/23).  Repeat echo 3/24 showed improvement with EF 50-55%, normal RV, bicuspid aortic valve with moderate AS and moderate AI. Echo 1/25 EF 55-60%, RV normal, moderate/severe AI. Improvement in EF suggests a role for cocaine in her cardiomyopathy (she had quit using drugs).  Now s/p AVR 4/25. Echo 5/25 EF 70-75%, moderate LVH, mild RV dysfunction, mild mitral regurgitation, bioprosthetic aortic valve with mean gradient 17 mmHg in setting of hyperdynamic LV, IVC normal, moderate pericardial effusion.  Ltd echo 9/25 showed EF 70-75%, G1DD, normal RV, moderate to severe AS (see #3 below). She is not volume overloaded on exam, NYHA class I-II.  - Continue losartan  25 mg daily.  (Did not tolerate Entresto  with hypotension, will hold off retrying now that EF is in normal range) - Continue Coreg  12.5 mg bid.      - Continue Jardiance  10 mg daily.  - Continue spironolactone  12.5 mg daily.  BMET/BNP today.  - Continue Lasix  20 mg PRN.   - Repeat ltd echo 9/25 showed no pericardial effusion.   2. CAD: DES to RCA in 9/22.  Cath in 11/23 with patent RCA stent, 50% proximal LAD, 75% mid LCx.  The LCx stenosis appeared similar to the prior cath, no intervention on LCx . Lp(a) markedly elevated with age-advanced CAD. No chest pain. - Continue ASA 81 and Plavix  75 mg daily  - Continue atorvastatin  40 mg daily  - Continue Repatha  - Continue Zetia  10 mg daily. 3. Aortic valve disorder: Moderate-severe AI on 11/23 echo and TEE. TEE showed bicuspid aortic valve with fusion of right and noncoronary cusps.  Moderate-severe AI by TEE.  By cardiac MRI, aortic valve regurgitant fraction was 38% which suggests severe AI.  She was deemed not to be a TAVR candidate, but with improvement in LV function, she should be SAVR candidate. TEE in 4/24 showed mild-moderate AS with mean gradient 19 and AVA 1.57 cm^2, moderate to severe AI. Echo 1/25 showed bicuspid AoV, aortic regurgitation mod-severe. Mild-mod AoV stenosis, AoV mean gradient 11 mmHg. Now s/p bioprosthetic aortic valve replacement 04/2023. Echo 5/25 w/ stable prosthesis, mean gradient 17 mmHg (mild elevation likely due to hyperdynamic LV function).  - Ltd echo 9/25 showed ? Moderate to severe bioprosthetic AoV stenosis, mean gradient lower 17 mmHg. Discussed with Dr. Rolan, recommend TEE to better evaluate valve. Informed Consent   Shared Decision Making/Informed Consent   The risks [esophageal damage, perforation (1:10,000 risk), bleeding, pharyngeal hematoma as well as other potential complications associated with conscious sedation including aspiration, arrhythmia, respiratory failure and death], benefits (treatment guidance and diagnostic support) and alternatives of a transesophageal echocardiogram were discussed in detail with  Ms. Costanza and she is willing to proceed.      - Recommended screening in 1st degree relatives for bicuspid aortic valve 4. Polysubstance abuse: Cocaine/heroin. She  reported abstinence and EF improved.  - Unfortunately used cocaine last week. Discussed importance of cessation. 5. H/o CVA: She is on Plavix . 6. Smoking: Smokes black and mild cigars occasionally.  - Discussed cessation.  Follow up with APP after TEE.   Harlene HERO [place], FNP-BC 01/30/2024 "

## 2024-01-30 NOTE — Telephone Encounter (Signed)
 Called to confirm/remind patient of their appointment at the [facility] on 01/31/24.   Appointment:   [] Confirmed  [x] Left mess   [] No answer/No voice mail  [] VM Full/unable to leave message  [] Phone not in service  And to bring in all medications and/or complete list.

## 2024-01-31 ENCOUNTER — Other Ambulatory Visit (HOSPITAL_COMMUNITY): Payer: Self-pay | Admitting: *Deleted

## 2024-01-31 ENCOUNTER — Encounter (HOSPITAL_COMMUNITY): Payer: Self-pay

## 2024-01-31 ENCOUNTER — Ambulatory Visit (HOSPITAL_COMMUNITY)
Admission: RE | Admit: 2024-01-31 | Discharge: 2024-01-31 | Disposition: A | Discharge status: Home / Self Care | Source: Ambulatory Visit | Attending: Family Medicine | Admitting: Family Medicine

## 2024-01-31 ENCOUNTER — Telehealth (HOSPITAL_COMMUNITY): Payer: Self-pay

## 2024-01-31 VITALS — BP 160/102 | HR 75 | Wt 149.2 lb

## 2024-01-31 DIAGNOSIS — Z7984 Long term (current) use of oral hypoglycemic drugs: Secondary | ICD-10-CM | POA: Insufficient documentation

## 2024-01-31 DIAGNOSIS — Z952 Presence of prosthetic heart valve: Secondary | ICD-10-CM | POA: Diagnosis not present

## 2024-01-31 DIAGNOSIS — I5022 Chronic systolic (congestive) heart failure: Secondary | ICD-10-CM | POA: Diagnosis not present

## 2024-01-31 DIAGNOSIS — F1111 Opioid abuse, in remission: Secondary | ICD-10-CM | POA: Insufficient documentation

## 2024-01-31 DIAGNOSIS — I252 Old myocardial infarction: Secondary | ICD-10-CM | POA: Insufficient documentation

## 2024-01-31 DIAGNOSIS — Z87891 Personal history of nicotine dependence: Secondary | ICD-10-CM

## 2024-01-31 DIAGNOSIS — Z7901 Long term (current) use of anticoagulants: Secondary | ICD-10-CM | POA: Insufficient documentation

## 2024-01-31 DIAGNOSIS — E785 Hyperlipidemia, unspecified: Secondary | ICD-10-CM | POA: Diagnosis not present

## 2024-01-31 DIAGNOSIS — I428 Other cardiomyopathies: Secondary | ICD-10-CM | POA: Insufficient documentation

## 2024-01-31 DIAGNOSIS — F17290 Nicotine dependence, other tobacco product, uncomplicated: Secondary | ICD-10-CM | POA: Insufficient documentation

## 2024-01-31 DIAGNOSIS — F1411 Cocaine abuse, in remission: Secondary | ICD-10-CM | POA: Insufficient documentation

## 2024-01-31 DIAGNOSIS — I110 Hypertensive heart disease with heart failure: Secondary | ICD-10-CM | POA: Insufficient documentation

## 2024-01-31 DIAGNOSIS — Z8673 Personal history of transient ischemic attack (TIA), and cerebral infarction without residual deficits: Secondary | ICD-10-CM | POA: Diagnosis not present

## 2024-01-31 DIAGNOSIS — F1910 Other psychoactive substance abuse, uncomplicated: Secondary | ICD-10-CM | POA: Diagnosis not present

## 2024-01-31 DIAGNOSIS — Z7902 Long term (current) use of antithrombotics/antiplatelets: Secondary | ICD-10-CM | POA: Insufficient documentation

## 2024-01-31 DIAGNOSIS — Z79899 Other long term (current) drug therapy: Secondary | ICD-10-CM | POA: Diagnosis not present

## 2024-01-31 DIAGNOSIS — I2510 Atherosclerotic heart disease of native coronary artery without angina pectoris: Secondary | ICD-10-CM | POA: Diagnosis not present

## 2024-01-31 DIAGNOSIS — I351 Nonrheumatic aortic (valve) insufficiency: Secondary | ICD-10-CM | POA: Diagnosis not present

## 2024-01-31 DIAGNOSIS — Z955 Presence of coronary angioplasty implant and graft: Secondary | ICD-10-CM | POA: Diagnosis not present

## 2024-01-31 LAB — CBC
HCT: 34.7 % — ABNORMAL LOW (ref 36.0–46.0)
Hemoglobin: 12.9 g/dL (ref 12.0–15.0)
MCH: 31.5 pg (ref 26.0–34.0)
MCHC: 37.2 g/dL — ABNORMAL HIGH (ref 30.0–36.0)
MCV: 84.8 fL (ref 80.0–100.0)
Platelets: 202 K/uL (ref 150–400)
RBC: 4.09 MIL/uL (ref 3.87–5.11)
RDW: 13.8 % (ref 11.5–15.5)
WBC: 5.7 K/uL (ref 4.0–10.5)
nRBC: 0 % (ref 0.0–0.2)

## 2024-01-31 NOTE — Patient Instructions (Addendum)
 No change in medications. Labs today - will call you if abnormal. Heart CT has been ordered. Radiology will call you to schedule (they have tried reaching you three times, but we will ask them to call you again). Return to see Dr. Rolan in 4 months - see below. Please call us  at [phone] if any questions or concerns prior to your next visit.

## 2024-01-31 NOTE — Telephone Encounter (Signed)
 error

## 2024-02-02 ENCOUNTER — Ambulatory Visit (HOSPITAL_COMMUNITY): Payer: Self-pay | Admitting: Family Medicine

## 2024-02-12 ENCOUNTER — Encounter (HOSPITAL_COMMUNITY): Payer: Self-pay

## 2024-02-14 ENCOUNTER — Ambulatory Visit (HOSPITAL_COMMUNITY)
Admission: RE | Admit: 2024-02-14 | Discharge: 2024-02-14 | Disposition: A | Discharge status: Home / Self Care | Source: Ambulatory Visit | Attending: Cardiology | Admitting: Cardiology

## 2024-02-14 DIAGNOSIS — I350 Nonrheumatic aortic (valve) stenosis: Secondary | ICD-10-CM | POA: Insufficient documentation

## 2024-02-14 MED ORDER — IOHEXOL 350 MG/ML SOLN
100.0000 mL | Freq: Once | INTRAVENOUS | Status: AC | PRN
  Administered 2024-02-14: 100 mL via INTRAVENOUS

## 2024-02-15 ENCOUNTER — Ambulatory Visit (HOSPITAL_COMMUNITY): Payer: Self-pay | Admitting: Cardiology

## 2024-02-15 DIAGNOSIS — I350 Nonrheumatic aortic (valve) stenosis: Secondary | ICD-10-CM

## 2024-02-19 NOTE — Telephone Encounter (Signed)
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  She was started on Eliquis  earlier this month.  Make sure she is taking Eliquis  5 mg bid + Plavix  75 daily.  I would like repeat echo  to reassess gradient across bioprosthetic aortic valve now that she is on Eliquis .

## 2024-03-13 ENCOUNTER — Ambulatory Visit (HOSPITAL_COMMUNITY)

## 2024-06-05 ENCOUNTER — Ambulatory Visit (HOSPITAL_COMMUNITY): Admitting: Cardiology
